# Patient Record
Sex: Male | Born: 1970
Health system: Southern US, Community
[De-identification: ages and names within clinical notes are randomized; demographics above are authoritative.]

## PROBLEM LIST (undated history)

## (undated) ENCOUNTER — Emergency Department (HOSPITAL_COMMUNITY)

## (undated) DIAGNOSIS — R768 Other specified abnormal immunological findings in serum: Secondary | ICD-10-CM

## (undated) DIAGNOSIS — K5900 Constipation, unspecified: Secondary | ICD-10-CM

## (undated) DIAGNOSIS — R911 Solitary pulmonary nodule: Secondary | ICD-10-CM

## (undated) DIAGNOSIS — N529 Male erectile dysfunction, unspecified: Secondary | ICD-10-CM

## (undated) DIAGNOSIS — K219 Gastro-esophageal reflux disease without esophagitis: Secondary | ICD-10-CM

## (undated) DIAGNOSIS — Z8719 Personal history of other diseases of the digestive system: Secondary | ICD-10-CM

## (undated) DIAGNOSIS — J449 Chronic obstructive pulmonary disease, unspecified: Secondary | ICD-10-CM

## (undated) DIAGNOSIS — G4730 Sleep apnea, unspecified: Secondary | ICD-10-CM

## (undated) DIAGNOSIS — Z862 Personal history of diseases of the blood and blood-forming organs and certain disorders involving the immune mechanism: Secondary | ICD-10-CM

## (undated) DIAGNOSIS — K7460 Unspecified cirrhosis of liver: Secondary | ICD-10-CM

## (undated) DIAGNOSIS — K5790 Diverticulosis of intestine, part unspecified, without perforation or abscess without bleeding: Secondary | ICD-10-CM

## (undated) DIAGNOSIS — Z5189 Encounter for other specified aftercare: Secondary | ICD-10-CM

## (undated) DIAGNOSIS — H269 Unspecified cataract: Secondary | ICD-10-CM

## (undated) DIAGNOSIS — K759 Inflammatory liver disease, unspecified: Secondary | ICD-10-CM

## (undated) DIAGNOSIS — K648 Other hemorrhoids: Secondary | ICD-10-CM

## (undated) DIAGNOSIS — K3189 Other diseases of stomach and duodenum: Secondary | ICD-10-CM

## (undated) DIAGNOSIS — F1910 Other psychoactive substance abuse, uncomplicated: Secondary | ICD-10-CM

## (undated) DIAGNOSIS — M5030 Other cervical disc degeneration, unspecified cervical region: Secondary | ICD-10-CM

## (undated) DIAGNOSIS — F1020 Alcohol dependence, uncomplicated: Secondary | ICD-10-CM

## (undated) DIAGNOSIS — M1990 Unspecified osteoarthritis, unspecified site: Secondary | ICD-10-CM

## (undated) DIAGNOSIS — R7689 Other specified abnormal immunological findings in serum: Secondary | ICD-10-CM

## (undated) DIAGNOSIS — I8500 Esophageal varices without bleeding: Secondary | ICD-10-CM

## (undated) DIAGNOSIS — I10 Essential (primary) hypertension: Secondary | ICD-10-CM

## (undated) DIAGNOSIS — K766 Portal hypertension: Secondary | ICD-10-CM

## (undated) HISTORY — DX: Diverticulosis of intestine, part unspecified, without perforation or abscess without bleeding: K57.90

## (undated) HISTORY — DX: Other diseases of stomach and duodenum: K76.6

## (undated) HISTORY — DX: Unspecified osteoarthritis, unspecified site: M19.90

## (undated) HISTORY — DX: Unspecified cirrhosis of liver: K74.60

## (undated) HISTORY — PX: KNEE SURGERY: SHX244

## (undated) HISTORY — PX: OTHER SURGICAL HISTORY: SHX169

## (undated) HISTORY — DX: Essential (primary) hypertension: I10

## (undated) HISTORY — DX: Alcohol dependence, uncomplicated: F10.20

## (undated) HISTORY — DX: Other diseases of stomach and duodenum: K31.89

## (undated) HISTORY — DX: Unspecified cataract: H26.9

## (undated) HISTORY — DX: Other specified abnormal immunological findings in serum: R76.89

## (undated) HISTORY — DX: Other hemorrhoids: K64.8

## (undated) HISTORY — DX: Other psychoactive substance abuse, uncomplicated: F19.10

## (undated) HISTORY — DX: Encounter for other specified aftercare: Z51.89

## (undated) HISTORY — DX: Sleep apnea, unspecified: G47.30

## (undated) HISTORY — DX: Esophageal varices without bleeding: I85.00

## (undated) HISTORY — DX: Other specified abnormal immunological findings in serum: R76.8

---

## 2000-09-30 ENCOUNTER — Emergency Department (HOSPITAL_COMMUNITY): Admission: EM | Admit: 2000-09-30 | Discharge: 2000-09-30 | Payer: Self-pay | Admitting: Emergency Medicine

## 2001-04-11 ENCOUNTER — Emergency Department (HOSPITAL_COMMUNITY): Admission: EM | Admit: 2001-04-11 | Discharge: 2001-04-11 | Payer: Self-pay | Admitting: Emergency Medicine

## 2001-04-11 ENCOUNTER — Encounter: Payer: Self-pay | Admitting: Emergency Medicine

## 2001-07-02 ENCOUNTER — Encounter: Payer: Self-pay | Admitting: Emergency Medicine

## 2001-07-02 ENCOUNTER — Emergency Department (HOSPITAL_COMMUNITY): Admission: EM | Admit: 2001-07-02 | Discharge: 2001-07-02 | Payer: Self-pay | Admitting: Emergency Medicine

## 2001-09-15 ENCOUNTER — Encounter: Payer: Self-pay | Admitting: Emergency Medicine

## 2001-09-15 ENCOUNTER — Inpatient Hospital Stay (HOSPITAL_COMMUNITY): Admission: AC | Admit: 2001-09-15 | Discharge: 2001-09-24 | Payer: Self-pay

## 2001-09-16 ENCOUNTER — Encounter: Payer: Self-pay | Admitting: Orthopedic Surgery

## 2001-09-16 ENCOUNTER — Encounter: Payer: Self-pay | Admitting: General Surgery

## 2001-09-17 ENCOUNTER — Encounter: Payer: Self-pay | Admitting: Orthopedic Surgery

## 2001-09-18 ENCOUNTER — Encounter: Payer: Self-pay | Admitting: General Surgery

## 2001-09-24 ENCOUNTER — Inpatient Hospital Stay (HOSPITAL_COMMUNITY)
Admission: RE | Admit: 2001-09-24 | Discharge: 2001-10-01 | Payer: Self-pay | Admitting: Physical Medicine & Rehabilitation

## 2002-02-11 ENCOUNTER — Ambulatory Visit (HOSPITAL_BASED_OUTPATIENT_CLINIC_OR_DEPARTMENT_OTHER): Admission: RE | Admit: 2002-02-11 | Discharge: 2002-02-11 | Payer: Self-pay | Admitting: Orthopedic Surgery

## 2002-02-26 ENCOUNTER — Encounter: Admission: RE | Admit: 2002-02-26 | Discharge: 2002-05-27 | Payer: Self-pay | Admitting: Orthopedic Surgery

## 2002-05-28 ENCOUNTER — Encounter: Admission: RE | Admit: 2002-05-28 | Discharge: 2002-08-26 | Payer: Self-pay | Admitting: Orthopedic Surgery

## 2002-08-27 ENCOUNTER — Encounter: Admission: RE | Admit: 2002-08-27 | Discharge: 2002-10-12 | Payer: Self-pay | Admitting: Orthopedic Surgery

## 2003-01-20 ENCOUNTER — Encounter: Admission: RE | Admit: 2003-01-20 | Discharge: 2003-04-20 | Payer: Self-pay | Admitting: Orthopaedic Surgery

## 2003-04-21 ENCOUNTER — Encounter: Admission: RE | Admit: 2003-04-21 | Discharge: 2003-06-25 | Payer: Self-pay | Admitting: Orthopaedic Surgery

## 2003-07-07 ENCOUNTER — Ambulatory Visit (HOSPITAL_BASED_OUTPATIENT_CLINIC_OR_DEPARTMENT_OTHER): Admission: RE | Admit: 2003-07-07 | Discharge: 2003-07-07 | Payer: Self-pay | Admitting: Orthopedic Surgery

## 2012-09-07 ENCOUNTER — Emergency Department (INDEPENDENT_AMBULATORY_CARE_PROVIDER_SITE_OTHER): Payer: Self-pay

## 2012-09-07 ENCOUNTER — Emergency Department (INDEPENDENT_AMBULATORY_CARE_PROVIDER_SITE_OTHER)
Admission: EM | Admit: 2012-09-07 | Discharge: 2012-09-07 | Disposition: A | Discharge status: Home / Self Care | Payer: Self-pay | Source: Home / Self Care | Attending: Emergency Medicine | Admitting: Emergency Medicine

## 2012-09-07 ENCOUNTER — Encounter (HOSPITAL_COMMUNITY): Payer: Self-pay | Admitting: Emergency Medicine

## 2012-09-07 DIAGNOSIS — S82409A Unspecified fracture of shaft of unspecified fibula, initial encounter for closed fracture: Secondary | ICD-10-CM

## 2012-09-07 MED ORDER — OXYCODONE-ACETAMINOPHEN 5-325 MG PO TABS: 2.0000 | ORAL_TABLET | Freq: Three times a day (TID) | ORAL | Status: DC | PRN

## 2012-09-07 MED ORDER — IBUPROFEN 800 MG PO TABS: 800.0000 mg | ORAL_TABLET | Freq: Three times a day (TID) | ORAL | Status: DC

## 2012-09-07 NOTE — ED Notes (Signed)
Pt states that he was walking down the steps last p.m about 10:30  and missed a step and falling with all weight on right leg.  Pain is worse with walking. Pain is located in upper calf area. Some swelling.   Pt has used goodies powders for pain relief.

## 2012-09-07 NOTE — ED Provider Notes (Signed)
History     CSN: [id number]  Arrival date & time 09/07/12  1413   First MD Initiated Contact with Patient 09/07/12 1416      Chief Complaint  Patient presents with  . Leg Injury    right leg injury. walking down steps in the dark and missed a step fall with all weight on right leg.    (Consider location/radiation/quality/duration/timing/severity/associated sxs/prior treatment) HPI Comments: Patient presents this afternoon to urgent care complaining of pain to the right lower aspect of his right lower leg under the level of his right knee. He describes that last night he was walking down the steps around 10:30 PM when he missed a step and fell with all his weight on his right leg. Since last night he's been expressing moderate to severe pain in the affected area. Denies any numbness, tingling sensation or weakness of his lower leg. Describes some swelling to the lateral aspect of his right lower leg. Patient did not sustain any cuts, although has some superficial abrasions to the anterior aspect of his knee.  Patient is a 41 y.o. male presenting with fall. The history is provided by the patient.  Fall The accident occurred yesterday. The fall occurred while walking. He fell from a height of 3 to 5 ft. He landed on a hard floor. Point of impact: Right leg. Pain location: Right leg under knee. The pain is at a severity of 7/10. The pain is moderate. He was ambulatory at the scene. There was no entrapment after the fall. Pertinent negatives include no visual change, no fever, no numbness, no bowel incontinence and no tingling. The symptoms are aggravated by activity, standing, flexion, ambulation and extension. He has tried NSAIDs for the symptoms. The treatment provided no relief.    History reviewed. No pertinent past medical history.  Past Surgical History  Procedure Date  . Arm surgery   . Knee surgery     History reviewed. No pertinent family history.  History  Substance Use  Topics  . Smoking status: Current Every Day Smoker -- 0.5 packs/day    Types: Cigarettes  . Smokeless tobacco: Not on file  . Alcohol Use: Yes     occasional      Review of Systems  Constitutional: Positive for activity change. Negative for fever.  Gastrointestinal: Negative for bowel incontinence.  Musculoskeletal:       Right lower leg pain  Skin: Positive for wound.  Neurological: Negative for tingling, weakness and numbness.    Allergies  Review of patient's allergies indicates no known allergies.  Home Medications   Current Outpatient Rx  Name Route Sig Dispense Refill  . IBUPROFEN 800 MG PO TABS Oral Take 1 tablet (800 mg total) by mouth 3 (three) times daily. 20 tablet 0  . OXYCODONE-ACETAMINOPHEN 5-325 MG PO TABS Oral Take 2 tablets by mouth every 8 (eight) hours as needed for pain. 15 tablet 0    BP 130/82  Pulse 83  Temp 98.1 F (36.7 C) (Oral)  Resp 21  SpO2 98%  Physical Exam  Nursing note and vitals reviewed. Constitutional: Vital signs are normal. He appears well-developed and well-nourished.  Non-toxic appearance. He does not have a sickly appearance. He does not appear ill. No distress.  Musculoskeletal: He exhibits tenderness.       Right knee: He exhibits normal range of motion and no swelling.       Legs: Neurological: He is alert.  Skin: No erythema.    ED Course  Procedures (including critical care time)  Labs Reviewed - No data to display Dg Tibia/fibula Right  09/07/2012  *RADIOLOGY REPORT*  Clinical Data: Proximal lateral lower leg pain status post fall.  RIGHT TIBIA AND FIBULA - 2 VIEW  Comparison: None.  Findings: There is an oblique mildly displaced fracture of the proximal fibular diaphysis.  This demonstrates no extension to the proximal tibiofibular articulation.  No tibial fracture is identified.  There is no evidence of dislocation. The distal fibular tip is not visualized on the AP view.  IMPRESSION: Mildly displaced fracture of  the proximal fibula.   Original Report Authenticated By: Gerrianne Scale, M.D.      1. Fibula fracture       MDM  Minimally displaced right proximal fibula fracture. Status post fall, patient with no neurovascular deficits distally. Management and case was discussed with the orthopedic Dr. on call Dr. Magnus Ivan, decision was made to put patient in a knee immobilizer, pain management with both Motrin and Percocet as needed. Patient was instructed to followup with Dr. Magnus Ivan next week. Patient agrees and understands treatment plan and followup care with orthopedic services.        Jimmie Molly, MD 09/07/12 [phone]

## 2015-03-03 ENCOUNTER — Emergency Department (HOSPITAL_COMMUNITY)
Admission: EM | Admit: 2015-03-03 | Discharge: 2015-03-03 | Disposition: A | Discharge status: Home / Self Care | Payer: Self-pay | Attending: Emergency Medicine | Admitting: Emergency Medicine

## 2015-03-03 ENCOUNTER — Emergency Department (HOSPITAL_COMMUNITY): Payer: Self-pay

## 2015-03-03 ENCOUNTER — Encounter (HOSPITAL_COMMUNITY): Payer: Self-pay | Admitting: Emergency Medicine

## 2015-03-03 DIAGNOSIS — Z720 Tobacco use: Secondary | ICD-10-CM | POA: Insufficient documentation

## 2015-03-03 DIAGNOSIS — Y998 Other external cause status: Secondary | ICD-10-CM | POA: Insufficient documentation

## 2015-03-03 DIAGNOSIS — W3400XA Accidental discharge from unspecified firearms or gun, initial encounter: Secondary | ICD-10-CM | POA: Insufficient documentation

## 2015-03-03 DIAGNOSIS — S71031A Puncture wound without foreign body, right hip, initial encounter: Secondary | ICD-10-CM

## 2015-03-03 DIAGNOSIS — S71001A Unspecified open wound, right hip, initial encounter: Secondary | ICD-10-CM | POA: Insufficient documentation

## 2015-03-03 DIAGNOSIS — Y9389 Activity, other specified: Secondary | ICD-10-CM | POA: Insufficient documentation

## 2015-03-03 DIAGNOSIS — Y92410 Unspecified street and highway as the place of occurrence of the external cause: Secondary | ICD-10-CM | POA: Insufficient documentation

## 2015-03-03 LAB — PREPARE FRESH FROZEN PLASMA
UNIT DIVISION: 0
Unit division: 0

## 2015-03-03 LAB — I-STAT CG4 LACTIC ACID, ED: Lactic Acid, Venous: 1.58 mmol/L (ref 0.5–2.0)

## 2015-03-03 LAB — I-STAT CHEM 8, ED
BUN: 10 mg/dL (ref 6–23)
Calcium, Ion: 1.09 mmol/L — ABNORMAL LOW (ref 1.12–1.23)
Chloride: 98 mmol/L (ref 96–112)
Creatinine, Ser: 1.1 mg/dL (ref 0.50–1.35)
Glucose, Bld: 118 mg/dL — ABNORMAL HIGH (ref 70–99)
HEMATOCRIT: 51 % (ref 39.0–52.0)
Hemoglobin: 17.3 g/dL — ABNORMAL HIGH (ref 13.0–17.0)
Potassium: 3.8 mmol/L (ref 3.5–5.1)
SODIUM: 135 mmol/L (ref 135–145)
TCO2: 22 mmol/L (ref 0–100)

## 2015-03-03 LAB — ABO/RH: ABO/RH(D): A POS

## 2015-03-03 MED ORDER — FENTANYL CITRATE (PF) 100 MCG/2ML IJ SOLN
100.0000 ug | Freq: Once | INTRAMUSCULAR | Status: AC
  Administered 2015-03-03: 100 ug via INTRAVENOUS
  Filled 2015-03-03: qty 2

## 2015-03-03 MED ORDER — FENTANYL CITRATE (PF) 100 MCG/2ML IJ SOLN
100.0000 ug | Freq: Once | INTRAMUSCULAR | Status: AC
  Administered 2015-03-03: 100 ug via INTRAVENOUS

## 2015-03-03 MED ORDER — HYDROCODONE-ACETAMINOPHEN 5-325 MG PO TABS: 2.0000 | ORAL_TABLET | ORAL | Status: DC | PRN

## 2015-03-03 MED ORDER — IBUPROFEN 800 MG PO TABS: 800.0000 mg | ORAL_TABLET | Freq: Three times a day (TID) | ORAL | Status: DC

## 2015-03-03 MED ORDER — FENTANYL CITRATE (PF) 100 MCG/2ML IJ SOLN
INTRAMUSCULAR | Status: AC
  Filled 2015-03-03: qty 2
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
## 2015-03-03 NOTE — ED Notes (Signed)
Patient transported to X-ray 

## 2015-03-04 LAB — TYPE AND SCREEN
ABO/RH(D): A POS
Antibody Screen: NEGATIVE
Unit division: 0
Unit division: 0
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
## 2015-03-15 ENCOUNTER — Emergency Department (HOSPITAL_COMMUNITY)
Admission: EM | Admit: 2015-03-15 | Discharge: 2015-03-15 | Disposition: A | Discharge status: Home / Self Care | Payer: Self-pay | Attending: Emergency Medicine | Admitting: Emergency Medicine

## 2015-03-15 ENCOUNTER — Encounter (HOSPITAL_COMMUNITY): Payer: Self-pay | Admitting: *Deleted

## 2015-03-15 ENCOUNTER — Emergency Department (HOSPITAL_COMMUNITY): Payer: Self-pay

## 2015-03-15 DIAGNOSIS — Y9289 Other specified places as the place of occurrence of the external cause: Secondary | ICD-10-CM | POA: Insufficient documentation

## 2015-03-15 DIAGNOSIS — Z720 Tobacco use: Secondary | ICD-10-CM | POA: Insufficient documentation

## 2015-03-15 DIAGNOSIS — W1839XA Other fall on same level, initial encounter: Secondary | ICD-10-CM | POA: Insufficient documentation

## 2015-03-15 DIAGNOSIS — R55 Syncope and collapse: Secondary | ICD-10-CM | POA: Insufficient documentation

## 2015-03-15 DIAGNOSIS — Z043 Encounter for examination and observation following other accident: Secondary | ICD-10-CM | POA: Insufficient documentation

## 2015-03-15 DIAGNOSIS — R079 Chest pain, unspecified: Secondary | ICD-10-CM | POA: Insufficient documentation

## 2015-03-15 DIAGNOSIS — Y9389 Activity, other specified: Secondary | ICD-10-CM | POA: Insufficient documentation

## 2015-03-15 DIAGNOSIS — Y998 Other external cause status: Secondary | ICD-10-CM | POA: Insufficient documentation

## 2015-03-15 DIAGNOSIS — Z79899 Other long term (current) drug therapy: Secondary | ICD-10-CM | POA: Insufficient documentation

## 2015-03-15 LAB — BASIC METABOLIC PANEL
ANION GAP: 13 (ref 5–15)
CALCIUM: 9.3 mg/dL (ref 8.9–10.3)
CO2: 24 mmol/L (ref 22–32)
CREATININE: 0.88 mg/dL (ref 0.61–1.24)
Chloride: 100 mmol/L — ABNORMAL LOW (ref 101–111)
GFR calc Af Amer: >60 mL/min (ref >60)
GFR calc non Af Amer: >60 mL/min (ref >60)
Glucose, Bld: 112 mg/dL — ABNORMAL HIGH (ref 70–99)
Potassium: 4 mmol/L (ref 3.5–5.1)
Sodium: 137 mmol/L (ref 135–145)

## 2015-03-15 LAB — CBC
HCT: 45.3 % (ref 39.0–52.0)
Hemoglobin: 15.8 g/dL (ref 13.0–17.0)
MCH: 33.9 pg (ref 26.0–34.0)
MCHC: 34.9 g/dL (ref 30.0–36.0)
MCV: 97.2 fL (ref 78.0–100.0)
Platelets: 196 10*3/uL (ref 150–400)
RBC: 4.66 MIL/uL (ref 4.22–5.81)
RDW: 14 % (ref 11.5–15.5)
WBC: 5 10*3/uL (ref 4.0–10.5)

## 2015-03-15 LAB — I-STAT TROPONIN, ED: TROPONIN I, POC: 0 ng/mL (ref 0.00–0.08)

## 2015-03-15 MED ORDER — IOHEXOL 350 MG/ML SOLN
80.0000 mL | Freq: Once | INTRAVENOUS | Status: AC | PRN
  Administered 2015-03-15: 80 mL via INTRAVENOUS

## 2015-03-15 MED ORDER — LORAZEPAM 1 MG PO TABS
1.0000 mg | ORAL_TABLET | Freq: Once | ORAL | Status: AC
  Administered 2015-03-15: 1 mg via ORAL
  Filled 2015-03-15: qty 1

## 2015-03-15 MED ORDER — SODIUM CHLORIDE 0.9 % IV BOLUS (SEPSIS)
1000.0000 mL | Freq: Once | INTRAVENOUS | Status: AC
  Administered 2015-03-15: 1000 mL via INTRAVENOUS

## 2015-03-15 NOTE — Discharge Instructions (Signed)
Syncope °Syncope is a medical term for fainting or passing out. This means you lose consciousness and drop to the ground. People are generally unconscious for less than 5 minutes. You may have some muscle twitches for up to 15 seconds before waking up and returning to normal. Syncope occurs more often in older adults, but it can happen to anyone. While most causes of syncope are not dangerous, syncope can be a sign of a serious medical problem. It is important to seek medical care.  °CAUSES  °Syncope is caused by a sudden drop in blood flow to the brain. The specific cause is often not determined. Factors that can bring on syncope include: °· Taking medicines that lower blood pressure. °· Sudden changes in posture, such as standing up quickly. °· Taking more medicine than prescribed. °· Standing in one place for too long. °· Seizure disorders. °· Dehydration and excessive exposure to heat. °· Low blood sugar (hypoglycemia). °· Straining to have a bowel movement. °· Heart disease, irregular heartbeat, or other circulatory problems. °· Fear, emotional distress, seeing blood, or severe pain. °SYMPTOMS  °Right before fainting, you may: °· Feel dizzy or light-headed. °· Feel nauseous. °· See all white or all black in your field of vision. °· Have cold, clammy skin. °DIAGNOSIS  °Your health care provider will ask about your symptoms, perform a physical exam, and perform an electrocardiogram (ECG) to record the electrical activity of your heart. Your health care provider may also perform other heart or blood tests to determine the cause of your syncope which may include: °· Transthoracic echocardiogram (TTE). During echocardiography, sound waves are used to evaluate how blood flows through your heart. °· Transesophageal echocardiogram (TEE). °· Cardiac monitoring. This allows your health care provider to monitor your heart rate and rhythm in real time. °· Holter monitor. This is a portable device that records your  heartbeat and can help diagnose heart arrhythmias. It allows your health care provider to track your heart activity for several days, if needed. °· Stress tests by exercise or by giving medicine that makes the heart beat faster. °TREATMENT  °In most cases, no treatment is needed. Depending on the cause of your syncope, your health care provider may recommend changing or stopping some of your medicines. °HOME CARE INSTRUCTIONS °· Have someone stay with you until you feel stable. °· Do not drive, use machinery, or play sports until your health care provider says it is okay. °· Keep all follow-up appointments as directed by your health care provider. °· Lie down right away if you start feeling like you might faint. Breathe deeply and steadily. Wait until all the symptoms have passed. °· Drink enough fluids to keep your urine clear or pale yellow. °· If you are taking blood pressure or heart medicine, get up slowly and take several minutes to sit and then stand. This can reduce dizziness. °SEEK IMMEDIATE MEDICAL CARE IF:  °· You have a severe headache. °· You have unusual pain in the chest, abdomen, or back. °· You are bleeding from your mouth or rectum, or you have black or tarry stool. °· You have an irregular or very fast heartbeat. °· You have pain with breathing. °· You have repeated fainting or seizure-like jerking during an episode. °· You faint when sitting or lying down. °· You have confusion. °· You have trouble walking. °· You have severe weakness. °· You have vision problems. °If you fainted, call your local emergency services (911 in U.[place] Do not drive   yourself to the hospital.  MAKE SURE YOU:  Understand these instructions.  Will watch your condition.  Will get help right away if you are not doing well or get worse. Document Released: 10/29/2005 Document Revised: 11/03/2013 Document Reviewed: 12/28/2011 [facility] Patient Information 2015 [place], [place]. This information is not intended to replace  advice given to you by your health care provider. Make sure you discuss any questions you have with your health care provider.  Emergency Department Resource Guide 1) Find a Doctor and Pay Out of Pocket Although you won't have to find out who is covered by your insurance plan, it is a good idea to ask around and get recommendations. You will then need to call the office and see if the doctor you have chosen will accept you as a new patient and what types of options they offer for patients who are self-pay. Some doctors offer discounts or will set up payment plans for their patients who do not have insurance, but you will need to ask so you aren't surprised when you get to your appointment.  2) Contact Your Local Health Department Not all health departments have doctors that can see patients for sick visits, but many do, so it is worth a call to see if yours does. If you don't know where your local health department is, you can check in your phone book. The CDC also has a tool to help you locate your state's health department, and many state websites also have listings of all of their local health departments.  3) Find a [facility] If your illness is not likely to be very severe or complicated, you may want to try a walk in clinic. These are popping up all over the country in pharmacies, drugstores, and shopping centers. They're usually staffed by nurse practitioners or physician assistants that have been trained to treat common illnesses and complaints. They're usually fairly quick and inexpensive. However, if you have serious medical issues or chronic medical problems, these are probably not your best option.  No Primary Care Doctor: - Call Health Connect at  [phone] - they can help you locate a primary care doctor that  accepts your insurance, provides certain services, etc. - Physician Referral Service- [phone]  Chronic Pain Problems: Organization         Address  Phone    Notes  [facility]  [phone] Patients need to be referred by their primary care doctor.   Medication Assistance: Organization         Address  Phone   Notes  [facility] Medication [facility] [place] [address] [place], [place] [address] [phone] --Must be a resident of [facility] -- Must have NO insurance coverage whatsoever (no Medicaid/ Medicare, etc.) -- The pt. MUST have a primary care doctor that directs their care regularly and follows them in the community   Med[facility]ist  [phone]   [employer]  [phone]    Agencies that provide inexpensive medical care: Organization         Address  Phone   Notes  [place]  [phone]   [facility]    [phone]   [facility] [place], [place] [address] [phone]   [place] [address] [place] [phone]   Planned Parenthood    [phone]   [facility]    [phone]  Community Health and [place]  [address], [place] Phone:  [phone], Fax:  [phone] Hours of Operation:  9 am - 6 pm, M-F.  Also accepts Medicaid/Medicare and self-pay.  [facility] for [place], [address], [place] Phone: [phone], Fax: [phone]. Hours of Operation:  8:30 am - 5:30 pm, M-F.  Also accepts Medicaid and self-pay.  [facility] [place] [address], [place] Phone: [phone]   [place], [place], [place] [phone], Ext. [phone] Mondays & Thursdays: 7-9 AM.  First 15 patients are seen on a first come, first serve basis.    [place] Providers:  Organization         Address  Phone   Notes  [facility] [address], [place] [phone] Also accepts self-pay patients.  [facility]  [address], [place]  [phone]   [place], [address], [place] [phone]   [facility] [address] [place] [phone]   Lucianne Lei [address], [place]   [phone] Only accepts [place] Access [place] patients after they have their name applied to their card.   Self-Pay (no insurance) in [facility]:  Organization         Address  Phone   Notes  Sickle Cell Patients, [facility] Internal Medicine [place] [phone]   [facility] [place] [phone]   [facility] [place]  [place], [place], [place] [phone]   [facility]/Dr. Osei-Bonsu  [address], [place] or [address], [place] [phone] Phone number for both [place] and [place] locations is the same.  Urgent Medical and [facility] [address], [place] [phone]   [facility] [address] [place] or [address] [phone]   [facility] [address] [place] [phone], phone; [phone], fax Sees patients 1st and 3rd Saturday of every month.  Must not qualify for public or private insurance (i.e. Medicaid, Medicare, [place] Health Choice, Veterans' Benefits)  Household income should be no more than 200% of the poverty level The clinic cannot treat you if you are pregnant or think you are pregnant  Sexually transmitted diseases are not treated at the clinic.    Dental Care: Organization         Address  Phone  Notes  [facility] Department of [facility] [place] [phone] Accepts children up to age 71 who are enrolled in [place] or [place]; pregnant women with a Medicaid card; and children who have  applied for Medicaid or [place] Health Choice, but were declined, whose parents can pay a reduced fee at time of service.  [facility] Department of [facility]  [address], [place] [phone] Accepts children up to age 77 who are enrolled in [place] or [place]; pregnant women with a Medicaid card; and children who have applied for Medicaid or [place] Health Choice, but were declined, whose parents can pay a reduced fee at time of service.  [place]  [phone]  [place] [phone] Patients are seen by appointment only. Walk-ins are not accepted. [place] will see patients 56 years of age and older. Monday - Tuesday (8am-5pm) Most Wednesdays (8:30-5pm) $30 per visit, cash only  [facility] Adult Dental Access PROGRAM  [address], [facility] [phone] Patients are seen by appointment only. Walk-ins are not accepted. [place] will see patients 83 years of age and older. One Wednesday Evening (Monthly: Volunteer Based).  $30 per visit, cash only  [place]  [phone] for adults; Children under age 86, call Graduate Pediatric Dentistry at [phone]. Children aged 45-14, please call [phone] to request a pediatric application.  Dental services are provided in all areas of dental care including fillings, crowns and bridges, complete and partial dentures, implants, gum treatment, root canals, and extractions. Preventive care is also provided. Treatment is provided to both adults and children. Patients are selected via a lottery and there is often a waiting list.   [facility] [address] [place]  [phone] [email or web address]   [facility] [address] [place], [place] [phone], Ext. [phone] Second and Fourth Thursday of each month, opens at 6:30 AM; Clinic ends at 9 AM.  Patients are seen on a first-come first-served basis, and a  limited number are seen during each clinic.   [facility]  [address] [place], [place] [phone]   Eligibility Requirements You must have lived in [place], [place], or [place] counties for at least the last three months.   You cannot be eligible for state or federal sponsored [employer], including [employer], [place], or [employer].   You generally cannot be eligible for healthcare insurance through your employer.    How to apply: Eligibility screenings are held every Tuesday and Wednesday afternoon from 1:00 pm until 4:00 pm. You do not need an appointment for the interview!  [facility] [address], [place], [place]   [place]  [place] Department  [place]  [phone]    Behavioral Health Resources in the Community: Intensive Outpatient Programs Organization         Address  Phone  Notes  [place]. [address], [place], [place] [phone]   [facility] [address], [place], [place]   ADS: Alcohol & Drug Svcs [address], [place], [place]   [place] [address],  [place], [place] or [phone]   Substance Abuse Resources Organization         Address  Phone  Notes  Alcohol and Drug Services  [phone]   [place]  [phone]   [facility] [place]   Chinita Pester  [phone]   Residential & Outpatient Substance Abuse Program  [phone]   Psychological Services Organization         Address  Phone  Notes  [facility] [place]  [place]  [phone]   [place] [address] [place] or [phone]    Mobile Crisis Teams Organization          Address  Phone  Notes  [facility], Mobile Crisis Care Unit  [phone]   [facility]  [address] [place], [place]  [facility] [place] [address], [address] [place] [phone]    Self-Help/Support Groups Organization         Address  Phone             Notes  [facility] [place] - variety of support groups  [place] Call for more information  Narcotics Anonymous (NA), [facility] [address], [employer] [place]  2 meetings at this location   Special educational needs teacher         Address  Phone  Notes  ASAP Residential Treatment [place],    [place]  [phone]   [facility]  [address], [place] [address]1884, [place], [place]   [place], [place] [phone] Admissions: 8am-3pm M-F  Incentives Substance [place] [address]    [place], [place] [phone]   [facility] [address] [place], [place], [place]   [facility] [address]  [place], [place]   [facility] - Intensive Outpatient [address], [place], [place]   [facility] ([place] [place].,  [place], [place] [phone] or [phone]   [facility] Services (RTS) [address] [place], [place] Accepts Medicaid  [facility] [place] [address]  [place] [phone] Substance Abuse/Addiction Treatment   [facility] Organization         Address  Phone  Notes  [facility]  [phone]   Domenic Schwab, PhD [address] [place], [place]   [phone] or [phone]   [place], [place] [phone]   [facility] [address], [place], [place] [phone] Insurance/Medicaid/sponsorship  through [facility] and Families [address], [address] [place]                                    [place], [place] [phone] [place] [address], [place] [phone]    Dr. Adele Schilder  [phone]   [facility] [place] Dept. 1) [address], [place] 2) [place] 3)  [place], [place] [phone]  [phone]   [place] [phone] or [phone] (After Hours)       You have a nodule on your lung that will need repeat CT scan of your chest in the next 6-12 months.  Follow up with your family doctor for repeat imaging.
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
## 2015-03-15 NOTE — ED Notes (Addendum)
Pt states he has been having episodes of coughing where he would pass out, now reports that he has been having episodes of syncope when he was not cough. Reports that he is dizzy when he is up moving around. Pt alert, oriented x 4, nad. No neuro deficits noted.

## 2015-03-15 NOTE — ED Notes (Signed)
MD at bedside. 

## 2015-03-15 NOTE — ED Notes (Addendum)
Pt states that he has been coughing and "loosing his breath" pt would then "pass out". pt states that this has been ongoing for several days. Pt states that she he would do that his whole body would shake. Pt tearful at triage. States that he does not feel well. Pt alert and oriented x4 at this time. No neuro deficits at this time.

## 2015-04-24 ENCOUNTER — Encounter (HOSPITAL_COMMUNITY): Payer: Self-pay | Admitting: Emergency Medicine

## 2015-04-24 ENCOUNTER — Emergency Department (HOSPITAL_COMMUNITY)
Admission: EM | Admit: 2015-04-24 | Discharge: 2015-04-24 | Disposition: A | Discharge status: Home / Self Care | Payer: No Typology Code available for payment source | Attending: Emergency Medicine | Admitting: Emergency Medicine

## 2015-04-24 ENCOUNTER — Emergency Department (HOSPITAL_COMMUNITY): Payer: No Typology Code available for payment source

## 2015-04-24 DIAGNOSIS — S199XXA Unspecified injury of neck, initial encounter: Secondary | ICD-10-CM | POA: Diagnosis not present

## 2015-04-24 DIAGNOSIS — Y998 Other external cause status: Secondary | ICD-10-CM | POA: Diagnosis not present

## 2015-04-24 DIAGNOSIS — Z720 Tobacco use: Secondary | ICD-10-CM | POA: Insufficient documentation

## 2015-04-24 DIAGNOSIS — Z8659 Personal history of other mental and behavioral disorders: Secondary | ICD-10-CM | POA: Insufficient documentation

## 2015-04-24 DIAGNOSIS — M546 Pain in thoracic spine: Secondary | ICD-10-CM

## 2015-04-24 DIAGNOSIS — M542 Cervicalgia: Secondary | ICD-10-CM

## 2015-04-24 DIAGNOSIS — I10 Essential (primary) hypertension: Secondary | ICD-10-CM | POA: Diagnosis not present

## 2015-04-24 DIAGNOSIS — Z7951 Long term (current) use of inhaled steroids: Secondary | ICD-10-CM | POA: Diagnosis not present

## 2015-04-24 DIAGNOSIS — S299XXA Unspecified injury of thorax, initial encounter: Secondary | ICD-10-CM | POA: Diagnosis not present

## 2015-04-24 DIAGNOSIS — Y9389 Activity, other specified: Secondary | ICD-10-CM | POA: Diagnosis not present

## 2015-04-24 DIAGNOSIS — Y92410 Unspecified street and highway as the place of occurrence of the external cause: Secondary | ICD-10-CM | POA: Diagnosis not present

## 2015-04-24 MED ORDER — HYDROCODONE-ACETAMINOPHEN 5-325 MG PO TABS
1.0000 | ORAL_TABLET | Freq: Once | ORAL | Status: AC
  Administered 2015-04-24: 1 via ORAL
  Filled 2015-04-24: qty 1

## 2015-04-24 MED ORDER — CYCLOBENZAPRINE HCL 5 MG PO TABS: 5.0000 mg | ORAL_TABLET | Freq: Three times a day (TID) | ORAL | Status: DC | PRN

## 2015-04-24 MED ORDER — CYCLOBENZAPRINE HCL 10 MG PO TABS
5.0000 mg | ORAL_TABLET | Freq: Once | ORAL | Status: AC
  Administered 2015-04-24: 5 mg via ORAL
  Filled 2015-04-24: qty 1

## 2015-04-24 MED ORDER — HYDROCODONE-ACETAMINOPHEN 5-325 MG PO TABS: 2.0000 | ORAL_TABLET | Freq: Four times a day (QID) | ORAL | Status: DC | PRN

## 2015-04-24 MED ORDER — HYDROCODONE-ACETAMINOPHEN 5-325 MG PO TABS: 1.0000 | ORAL_TABLET | Freq: Once | ORAL | Status: DC

## 2015-04-24 NOTE — Discharge Instructions (Signed)
Cervical Sprain °A cervical sprain is an injury in the neck in which the strong, fibrous tissues (ligaments) that connect your neck bones stretch or tear. Cervical sprains can range from mild to severe. Severe cervical sprains can cause the neck vertebrae to be unstable. This can lead to damage of the spinal cord and can result in serious nervous system problems. The amount of time it takes for a cervical sprain to get better depends on the cause and extent of the injury. Most cervical sprains heal in 1 to 3 weeks. °CAUSES  °Severe cervical sprains may be caused by:  °· Contact sport injuries (such as from football, rugby, wrestling, hockey, auto racing, gymnastics, diving, martial arts, or boxing).   °· Motor vehicle collisions.   °· Whiplash injuries. This is an injury from a sudden forward and backward whipping movement of the head and neck.  °· Falls.   °Mild cervical sprains may be caused by:  °· Being in an awkward position, such as while cradling a telephone between your ear and shoulder.   °· Sitting in a chair that does not offer proper support.   °· Working at a poorly designed computer station.   °· Looking up or down for long periods of time.   °SYMPTOMS  °· Pain, soreness, stiffness, or a burning sensation in the front, back, or sides of the neck. This discomfort may develop immediately after the injury or slowly, 24 hours or more after the injury.   °· Pain or tenderness directly in the middle of the back of the neck.   °· Shoulder or upper back pain.   °· Limited ability to move the neck.   °· Headache.   °· Dizziness.   °· Weakness, numbness, or tingling in the hands or arms.   °· Muscle spasms.   °· Difficulty swallowing or chewing.   °· Tenderness and swelling of the neck.   °DIAGNOSIS  °Most of the time your health care provider can diagnose a cervical sprain by taking your history and doing a physical exam. Your health care provider will ask about previous neck injuries and any known neck  problems, such as arthritis in the neck. X-rays may be taken to find out if there are any other problems, such as with the bones of the neck. Other tests, such as a CT scan or MRI, may also be needed.  °TREATMENT  °Treatment depends on the severity of the cervical sprain. Mild sprains can be treated with rest, keeping the neck in place (immobilization), and pain medicines. Severe cervical sprains are immediately immobilized. Further treatment is done to help with pain, muscle spasms, and other symptoms and may include: °· Medicines, such as pain relievers, numbing medicines, or muscle relaxants.   °· Physical therapy. This may involve stretching exercises, strengthening exercises, and posture training. Exercises and improved posture can help stabilize the neck, strengthen muscles, and help stop symptoms from returning.   °HOME CARE INSTRUCTIONS  °· Put ice on the injured area.   °¨ Put ice in a plastic bag.   °¨ Place a towel between your skin and the bag.   °¨ Leave the ice on for 15-20 minutes, 3-4 times a day.   °· If your injury was severe, you may have been given a cervical collar to wear. A cervical collar is a two-piece collar designed to keep your neck from moving while it heals. °¨ Do not remove the collar unless instructed by your health care provider. °¨ If you have long hair, keep it outside of the collar. °¨ Ask your health care provider before making any adjustments to your collar. Minor   adjustments may be required over time to improve comfort and reduce pressure on your chin or on the back of your head. °¨ If you are allowed to remove the collar for cleaning or bathing, follow your health care provider's instructions on how to do so safely. °¨ Keep your collar clean by wiping it with mild soap and water and drying it completely. If the collar you have been given includes removable pads, remove them every 1-2 days and hand wash them with soap and water. Allow them to air dry. They should be completely  dry before you wear them in the collar. °¨ If you are allowed to remove the collar for cleaning and bathing, wash and dry the skin of your neck. Check your skin for irritation or sores. If you see any, tell your health care provider. °¨ Do not drive while wearing the collar.   °· Only take over-the-counter or prescription medicines for pain, discomfort, or fever as directed by your health care provider.   °· Keep all follow-up appointments as directed by your health care provider.   °· Keep all physical therapy appointments as directed by your health care provider.   °· Make any needed adjustments to your workstation to promote good posture.   °· Avoid positions and activities that make your symptoms worse.   °· Warm up and stretch before being active to help prevent problems.   °SEEK MEDICAL CARE IF:  °· Your pain is not controlled with medicine.   °· You are unable to decrease your pain medicine over time as planned.   °· Your activity level is not improving as expected.   °SEEK IMMEDIATE MEDICAL CARE IF:  °· You develop any bleeding. °· You develop stomach upset. °· You have signs of an allergic reaction to your medicine.   °· Your symptoms get worse.   °· You develop new, unexplained symptoms.   °· You have numbness, tingling, weakness, or paralysis in any part of your body.   °MAKE SURE YOU:  °· Understand these instructions. °· Will watch your condition. °· Will get help right away if you are not doing well or get worse. °Document Released: 08/26/2007 Document Revised: 11/03/2013 Document Reviewed: 05/06/2013 °[employer]® Patient Information ©2015 [employer]. This information is not intended to replace advice given to you by your health care provider. Make sure you discuss any questions you have with your health care provider. ° °Motor Vehicle Collision °It is common to have multiple bruises and sore muscles after a motor vehicle collision (MVC). These tend to feel worse for the first 24 hours. You may have  the most stiffness and soreness over the first several hours. You may also feel worse when you wake up the first morning after your collision. After this point, you will usually begin to improve with each day. The speed of improvement often depends on the severity of the collision, the number of injuries, and the location and nature of these injuries. °HOME CARE INSTRUCTIONS °· Put ice on the injured area. °¨ Put ice in a plastic bag. °¨ Place a towel between your skin and the bag. °¨ Leave the ice on for 15-20 minutes, 3-4 times a day, or as directed by your health care provider. °· Drink enough fluids to keep your urine clear or pale yellow. Do not drink alcohol. °· Take a warm shower or bath once or twice a day. This will increase blood flow to sore muscles. °· You may return to activities as directed by your caregiver. Be careful when lifting, as this may aggravate neck or back   pain. °· Only take over-the-counter or prescription medicines for pain, discomfort, or fever as directed by your caregiver. Do not use aspirin. This may increase bruising and bleeding. °SEEK IMMEDIATE MEDICAL CARE IF: °· You have numbness, tingling, or weakness in the arms or legs. °· You develop severe headaches not relieved with medicine. °· You have severe neck pain, especially tenderness in the middle of the back of your neck. °· You have changes in bowel or bladder control. °· There is increasing pain in any area of the body. °· You have shortness of breath, light-headedness, dizziness, or fainting. °· You have chest pain. °· You feel sick to your stomach (nauseous), throw up (vomit), or sweat. °· You have increasing abdominal discomfort. °· There is blood in your urine, stool, or vomit. °· You have pain in your shoulder (shoulder strap areas). °· You feel your symptoms are getting worse. °MAKE SURE YOU: °· Understand these instructions. °· Will watch your condition. °· Will get help right away if you are not doing well or get  worse. °Document Released: 10/29/2005 Document Revised: 03/15/2014 Document Reviewed: 03/28/2011 °[employer]® Patient Information ©2015 [employer]. This information is not intended to replace advice given to you by your health care provider. Make sure you discuss any questions you have with your health care provider. ° °

## 2015-04-24 NOTE — ED Notes (Signed)
Declined W/C at D/C and was escorted to lobby by RN. 
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
## 2015-05-20 ENCOUNTER — Ambulatory Visit: Payer: Self-pay | Admitting: Neurology

## 2016-07-05 ENCOUNTER — Other Ambulatory Visit: Payer: Self-pay

## 2016-07-05 ENCOUNTER — Emergency Department (HOSPITAL_COMMUNITY)
Admission: EM | Admit: 2016-07-05 | Discharge: 2016-07-05 | Disposition: A | Discharge status: Home / Self Care | Payer: Self-pay | Attending: Emergency Medicine | Admitting: Emergency Medicine

## 2016-07-05 ENCOUNTER — Encounter (HOSPITAL_COMMUNITY): Payer: Self-pay | Admitting: Emergency Medicine

## 2016-07-05 ENCOUNTER — Emergency Department (HOSPITAL_COMMUNITY): Payer: Self-pay

## 2016-07-05 DIAGNOSIS — Z720 Tobacco use: Secondary | ICD-10-CM

## 2016-07-05 DIAGNOSIS — R5382 Chronic fatigue, unspecified: Secondary | ICD-10-CM | POA: Insufficient documentation

## 2016-07-05 DIAGNOSIS — I10 Essential (primary) hypertension: Secondary | ICD-10-CM | POA: Insufficient documentation

## 2016-07-05 DIAGNOSIS — J441 Chronic obstructive pulmonary disease with (acute) exacerbation: Secondary | ICD-10-CM | POA: Insufficient documentation

## 2016-07-05 DIAGNOSIS — F17210 Nicotine dependence, cigarettes, uncomplicated: Secondary | ICD-10-CM | POA: Insufficient documentation

## 2016-07-05 DIAGNOSIS — R52 Pain, unspecified: Secondary | ICD-10-CM

## 2016-07-05 LAB — COMPREHENSIVE METABOLIC PANEL
ALBUMIN: 4.4 g/dL (ref 3.5–5.0)
ALT: 85 U/L — ABNORMAL HIGH (ref 17–63)
AST: 65 U/L — ABNORMAL HIGH (ref 15–41)
Alkaline Phosphatase: 55 U/L (ref 38–126)
Anion gap: 11 (ref 5–15)
BUN: 7 mg/dL (ref 6–20)
CO2: 22 mmol/L (ref 22–32)
Calcium: 9.1 mg/dL (ref 8.9–10.3)
Chloride: 98 mmol/L — ABNORMAL LOW (ref 101–111)
Creatinine, Ser: 0.83 mg/dL (ref 0.61–1.24)
GFR calc Af Amer: >60 mL/min (ref >60)
GFR calc non Af Amer: >60 mL/min (ref >60)
GLUCOSE: 93 mg/dL (ref 65–99)
POTASSIUM: 4 mmol/L (ref 3.5–5.1)
SODIUM: 131 mmol/L — AB (ref 135–145)
Total Bilirubin: 0.7 mg/dL (ref 0.3–1.2)
Total Protein: 8.1 g/dL (ref 6.5–8.1)

## 2016-07-05 LAB — CBC
HCT: 46.9 % (ref 39.0–52.0)
HEMOGLOBIN: 16 g/dL (ref 13.0–17.0)
MCH: 34.3 pg — ABNORMAL HIGH (ref 26.0–34.0)
MCHC: 34.1 g/dL (ref 30.0–36.0)
MCV: 100.6 fL — ABNORMAL HIGH (ref 78.0–100.0)
Platelets: 170 10*3/uL (ref 150–400)
RBC: 4.66 MIL/uL (ref 4.22–5.81)
RDW: 15 % (ref 11.5–15.5)
WBC: 9.9 10*3/uL (ref 4.0–10.5)

## 2016-07-05 MED ORDER — ALBUTEROL SULFATE HFA 108 (90 BASE) MCG/ACT IN AERS
2.0000 | INHALATION_SPRAY | Freq: Once | RESPIRATORY_TRACT | Status: AC
  Administered 2016-07-05: 2 via RESPIRATORY_TRACT
  Filled 2016-07-05: qty 6.7

## 2016-07-05 MED ORDER — PREDNISONE 20 MG PO TABS
20.0000 mg | ORAL_TABLET | Freq: Two times a day (BID) | ORAL | 0 refills | Status: DC
Start: 2016-07-05 — End: 2017-03-06

## 2016-07-05 MED ORDER — DOXYCYCLINE HYCLATE 100 MG PO CAPS: 100.0000 mg | ORAL_CAPSULE | Freq: Two times a day (BID) | ORAL | 0 refills | Status: DC

## 2016-07-05 MED ORDER — AEROCHAMBER Z-STAT PLUS/MEDIUM MISC
1.0000 | Freq: Once | Status: DC
  Filled 2016-07-05: qty 1

## 2016-07-05 MED ORDER — IPRATROPIUM BROMIDE 0.02 % IN SOLN
0.5000 mg | Freq: Once | RESPIRATORY_TRACT | Status: AC
  Administered 2016-07-05: 0.5 mg via RESPIRATORY_TRACT
  Filled 2016-07-05: qty 2.5

## 2016-07-05 MED ORDER — OPTICHAMBER DIAMOND MISC
1.0000 | Freq: Once | Status: DC
  Filled 2016-07-05: qty 1

## 2016-07-05 MED ORDER — ALBUTEROL SULFATE (2.5 MG/3ML) 0.083% IN NEBU
5.0000 mg | INHALATION_SOLUTION | Freq: Once | RESPIRATORY_TRACT | Status: AC
  Administered 2016-07-05: 5 mg via RESPIRATORY_TRACT
  Filled 2016-07-05: qty 6

## 2016-07-05 MED ORDER — PREDNISONE 20 MG PO TABS
60.0000 mg | ORAL_TABLET | Freq: Once | ORAL | Status: AC
  Administered 2016-07-05: 60 mg via ORAL
  Filled 2016-07-05: qty 3
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
## 2016-07-05 NOTE — Discharge Instructions (Signed)
It is important to follow-up with a primary care doctor, to arrange for a repeat CAT scan of your chest to evaluate the right-sided pulmonary nodule that was found in May 2016.  Try to stop smoking.  Use the inhaler 2 puffs every 3-4 hours as needed for cough or trouble breathing.

## 2016-07-05 NOTE — ED Provider Notes (Signed)
[facility] DEPT Provider Note   CSN: [id number] Arrival date & time: 07/05/16  1344     History   Chief Complaint Chief Complaint  Patient presents with  . Fatigue  . Shortness of Breath    HPI Levi Alvarez is a 45 y.o. male.  He complains of fatigue, cough, sputum production, green in color, and generalized weakness for a least a year. He is concerned that he might have had exposure to black mold, causing his symptoms. He also has itchy eyes and runny nose, clear rhinorrhea. Is not having fever, chills, focal weakness or paresthesias. He continues to smoke cigarettes. There are no other known modifying factors.   HPI  Past Medical History:  Diagnosis Date  . Hypertension     There are no active problems to display for this patient.   Past Surgical History:  Procedure Laterality Date  . arm surgery    . KNEE SURGERY         Home Medications    Prior to Admission medications   Medication Sig Start Date End Date Taking? Authorizing Provider  albuterol (PROVENTIL HFA;VENTOLIN HFA) 108 (90 BASE) MCG/ACT inhaler Inhale 1 puff into the lungs every 6 (six) hours as needed for wheezing or shortness of breath.    Historical Provider, MD  cyclobenzaprine (FLEXERIL) 5 MG tablet Take 1 tablet (5 mg total) by mouth 3 (three) times daily as needed for muscle spasms. 04/24/15   Delos Haring, PA-C  HYDROcodone-acetaminophen (NORCO/VICODIN) 5-325 MG per tablet Take 1 tablet by mouth once. 04/24/15   Delos Haring, PA-C  HYDROcodone-acetaminophen (NORCO/VICODIN) 5-325 MG per tablet Take 2 tablets by mouth every 6 (six) hours as needed. 04/24/15   Tiffany Carlota Raspberry, PA-C  ibuprofen (ADVIL,MOTRIN) 800 MG tablet Take 1 tablet (800 mg total) by mouth 3 (three) times daily. Patient not taking: Reported on 03/15/2015 03/03/15   Doy Hutching, MD  oxyCODONE-acetaminophen (PERCOCET/ROXICET) 5-325 MG per tablet Take 2 tablets by mouth every 8 (eight) hours as needed for pain. Patient not  taking: Reported on 03/03/2015 09/07/12   Rosana Hoes, MD    Family History History reviewed. No pertinent family history.  Social History Social History  Substance Use Topics  . Smoking status: Current Every Day Smoker    Packs/day: 0.50    Types: Cigarettes  . Smokeless tobacco: Never Used  . Alcohol use Yes     Comment: occasional     Allergies   Naproxen   Review of Systems Review of Systems  All other systems reviewed and are negative.    Physical Exam Updated Vital Signs BP 157/96 (BP Location: Right Arm)   Pulse 74   Temp 98.1 F (36.7 C) (Oral)   Resp 14   Ht 5\' 11"  (1.803 m)   Wt 206 lb 9 oz (93.7 kg)   SpO2 100%   BMI 28.81 kg/m   Physical Exam  Constitutional: He is oriented to person, place, and time. He appears well-developed. He appears distressed (He appears uncomfortable).  Appears older than stated age.  HENT:  Head: Normocephalic and atraumatic.  Right Ear: External ear normal.  Left Ear: External ear normal.  Eyes: Conjunctivae and EOM are normal. Pupils are equal, round, and reactive to light.  Neck: Normal range of motion and phonation normal. Neck supple.  Cardiovascular: Normal rate, regular rhythm and normal heart sounds.   Pulmonary/Chest: Effort normal. No respiratory distress. He exhibits no bony tenderness.  Few scattered rhonchi and wheezes.  Abdominal: Soft. There is  no tenderness.  Musculoskeletal: Normal range of motion.  Neurological: He is alert and oriented to person, place, and time. No cranial nerve deficit or sensory deficit. He exhibits normal muscle tone. Coordination normal.  Skin: Skin is warm, dry and intact.  Psychiatric: He has a normal mood and affect. His behavior is normal. Judgment and thought content normal.  Nursing note and vitals reviewed.    ED Treatments / Results  Labs (all labs ordered are listed, but only abnormal results are displayed) Labs Reviewed  CBC - Abnormal; Notable for the following:         Result Value   MCV 100.6 (*)    MCH 34.3 (*)    All other components within normal limits  COMPREHENSIVE METABOLIC PANEL - Abnormal; Notable for the following:    Sodium 131 (*)    Chloride 98 (*)    AST 65 (*)    ALT 85 (*)    All other components within normal limits    EKG  EKG Interpretation  Date/Time:  Thursday July 05 2016 14:01:52 EDT Ventricular Rate:  82 PR Interval:  130 QRS Duration: 82 QT Interval:  336 QTC Calculation: 392 R Axis:   63 Text Interpretation:  Normal sinus rhythm with sinus arrhythmia Normal ECG since last tracing no significant change Confirmed by Eulis Foster  MD, Sunni Richardson [phone]) on 07/05/2016 5:52:45 PM       Radiology Dg Chest 2 View  Result Date: 07/05/2016 CLINICAL DATA:  Cough and shortness of breath for 1 month. EXAM: CHEST  2 VIEW COMPARISON:  03/03/2015 FINDINGS: Cardiac silhouette is normal in size. Normal mediastinal and hilar contours. Clear lungs.  No pleural effusion or pneumothorax. Skeletal structures are unremarkable other than mild thoracic spine degenerative change. IMPRESSION: No active cardiopulmonary disease. Electronically Signed   By: Lajean Manes M.D.   On: 07/05/2016 14:42    Procedures Procedures (including critical care time)  Medications Ordered in ED Medications  albuterol (PROVENTIL HFA;VENTOLIN HFA) 108 (90 Base) MCG/ACT inhaler 2 puff (not administered)  aerochamber Z-Stat Plus/medium 1 each (not administered)  albuterol (PROVENTIL) (2.5 MG/3ML) 0.083% nebulizer solution 5 mg (5 mg Nebulization Given 07/05/16 1928)  ipratropium (ATROVENT) nebulizer solution 0.5 mg (0.5 mg Nebulization Given 07/05/16 1928)  predniSONE (DELTASONE) tablet 60 mg (60 mg Oral Given 07/05/16 1927)     Initial Impression / Assessment and Plan / ED Course  I have reviewed the triage vital signs and the nursing notes.  Pertinent labs & imaging results that were available during my care of the patient were reviewed by me and considered  in my medical decision making (see chart for details).  Clinical Course    Medications  optichamber diamond 1 each (not administered)  albuterol (PROVENTIL) (2.5 MG/3ML) 0.083% nebulizer solution 5 mg (5 mg Nebulization Given 07/05/16 1928)  ipratropium (ATROVENT) nebulizer solution 0.5 mg (0.5 mg Nebulization Given 07/05/16 1928)  predniSONE (DELTASONE) tablet 60 mg (60 mg Oral Given 07/05/16 1927)  albuterol (PROVENTIL HFA;VENTOLIN HFA) 108 (90 Base) MCG/ACT inhaler 2 puff (2 puffs Inhalation Given 07/05/16 2018)    Patient Vitals for the past 24 hrs:  BP Temp Temp src Pulse Resp SpO2 Height Weight  07/05/16 1932 157/96 - - 74 14 100 % - -  07/05/16 1845 155/98 - - 71 22 95 % - -  07/05/16 1830 140/94 - - 70 17 97 % - -  07/05/16 1800 154/94 - - 69 - 98 % - -  07/05/16 1745 155/86 - -  65 - 96 % - -  07/05/16 1733 151/92 98.1 F (36.7 C) Oral 70 18 96 % - -  07/05/16 1639 140/86 - - 72 18 99 % - -  07/05/16 1349 (!) 184/106 98.3 F (36.8 C) Oral 96 18 99 % 5\' 11"  (1.803 m) 206 lb 9 oz (93.7 kg)    Patient discussed his prior recommendation to have a repeat CAT scan, for a right-sided pulmonary nodule, which was given to him in May 2016. He has not yet done that. He plans on seeing a primary care doctor soon and prefers to hold with the doctor to arrange for CT imaging. At this point that seems a reasonable plan.   8:41 PM Reevaluation with update and discussion. After initial assessment and treatment, an updated evaluation reveals he feels better after treatment with nebulizer. Findings discussed with patient and family members, all questions answered. Mahima Hottle L    Final Clinical Impressions(s) / ED Diagnoses   Final diagnoses:  Pain  Chronic fatigue  COPD exacerbation (HCC)  Tobacco abuse    Nonspecific symptoms, possibly allergic related or related to COPD exacerbation from smoking. Doubt sepsis, metabolic instability or impending vascular collapse.   Nursing Notes  Reviewed/ Care Coordinated Applicable Imaging Reviewed Interpretation of Laboratory Data incorporated into ED treatment  The patient appears reasonably screened and/or stabilized for discharge and I doubt any other medical condition or other [facility] requiring further screening, evaluation, or treatment in the ED at this time prior to discharge.  Plan: Home Medications- continue; Home Treatments- stop smoking; return here if the recommended treatment, does not improve the symptoms; Recommended follow up- PCP ASAP for follow-up on abnormal lung CT from May 2016.   New Prescriptions Discharge Medication List as of 07/05/2016  8:14 PM    START taking these medications   Details  doxycycline (VIBRAMYCIN) 100 MG capsule Take 1 capsule (100 mg total) by mouth 2 (two) times daily. One po bid x 7 days, Starting Thu 07/05/2016, Print    predniSONE (DELTASONE) 20 MG tablet Take 1 tablet (20 mg total) by mouth 2 (two) times daily., Starting Thu 07/05/2016, Print         Daleen Bo, MD 07/05/16 2043

## 2016-07-05 NOTE — ED Notes (Signed)
Pt departed in NAD, refused use of wheelchair.  

## 2017-03-06 ENCOUNTER — Ambulatory Visit (INDEPENDENT_AMBULATORY_CARE_PROVIDER_SITE_OTHER): Payer: 59

## 2017-03-06 ENCOUNTER — Encounter (HOSPITAL_COMMUNITY): Payer: Self-pay | Admitting: Emergency Medicine

## 2017-03-06 ENCOUNTER — Ambulatory Visit (HOSPITAL_COMMUNITY)
Admission: EM | Admit: 2017-03-06 | Discharge: 2017-03-06 | Disposition: A | Discharge status: Home / Self Care | Payer: 59 | Attending: Family Medicine | Admitting: Family Medicine

## 2017-03-06 DIAGNOSIS — R0602 Shortness of breath: Secondary | ICD-10-CM

## 2017-03-06 DIAGNOSIS — R062 Wheezing: Secondary | ICD-10-CM | POA: Diagnosis not present

## 2017-03-06 DIAGNOSIS — R05 Cough: Secondary | ICD-10-CM | POA: Diagnosis not present

## 2017-03-06 DIAGNOSIS — J320 Chronic maxillary sinusitis: Secondary | ICD-10-CM

## 2017-03-06 DIAGNOSIS — I10 Essential (primary) hypertension: Secondary | ICD-10-CM

## 2017-03-06 MED ORDER — HYDROCODONE-HOMATROPINE 5-1.5 MG/5ML PO SYRP: 5.0000 mL | ORAL_SOLUTION | Freq: Four times a day (QID) | ORAL | 0 refills | Status: DC | PRN

## 2017-03-06 MED ORDER — AMLODIPINE BESYLATE 5 MG PO TABS: 5.0000 mg | ORAL_TABLET | Freq: Every day | ORAL | 1 refills | Status: DC

## 2017-03-06 NOTE — ED Provider Notes (Signed)
[facility]    CSN: [id number] Arrival date & time: 03/06/17  1000     History   Chief Complaint Chief Complaint  Patient presents with  . URI    HPI Levi Alvarez is a 46 y.o. male.   Pt has been suffering from sinus congestion and pain for several weeks.  He now complains over the last week of a cough, wheezing, SOB, ringing in his ears, and coughing up mucus tinged with blood.  He also complains of body aches and possible fever.  Patient drinks 1-2 sixpacks of beer every night. He smokes half a pack to a pack of cigarettes every day. He works at [employer] as a Runner, broadcasting/film/video.  Patient has been taking nasal sprays to help open his sinuses but this has not worked. He's lost his sense of smell.      Past Medical History:  Diagnosis Date  . Hypertension     There are no active problems to display for this patient.   Past Surgical History:  Procedure Laterality Date  . arm surgery    . KNEE SURGERY         Home Medications    Prior to Admission medications   Medication Sig Start Date End Date Taking? Authorizing Provider  albuterol (PROVENTIL HFA;VENTOLIN HFA) 108 (90 BASE) MCG/ACT inhaler Inhale 1 puff into the lungs every 6 (six) hours as needed for wheezing or shortness of breath.    Historical Provider, MD  amLODipine (NORVASC) 5 MG tablet Take 1 tablet (5 mg total) by mouth daily. 03/06/17   Robyn Haber, MD  HYDROcodone-homatropine (HYDROMET) 5-1.5 MG/5ML syrup Take 5 mLs by mouth every 6 (six) hours as needed for cough. 03/06/17   Robyn Haber, MD    Family History History reviewed. No pertinent family history.  Social History Social History  Substance Use Topics  . Smoking status: Current Every Day Smoker    Packs/day: 0.50    Types: Cigarettes  . Smokeless tobacco: Never Used  . Alcohol use Yes     Comment: occasional     Allergies   Naproxen   Review of Systems Review of Systems  Constitutional: Negative.   HENT:  Positive for congestion and sore throat.   Respiratory: Positive for cough, shortness of breath and wheezing.   Cardiovascular: Negative.   Gastrointestinal: Negative.   Genitourinary: Negative.   Neurological: Negative.      Physical Exam Triage Vital Signs ED Triage Vitals  Enc Vitals Group     BP 03/06/17 1025 (!) 208/107     Pulse Rate 03/06/17 1025 96     Resp --      Temp 03/06/17 1025 98.9 F (37.2 C)     Temp Source 03/06/17 1025 Oral     SpO2 03/06/17 1025 96 %     Weight --      Height --      Head Circumference --      Peak Flow --      Pain Score 03/06/17 1027 5     Pain Loc --      Pain Edu? --      Excl. in [place]? --    No data found.   Updated Vital Signs BP (!) 208/107 (BP Location: Left Arm)   Pulse 96   Temp 98.9 F (37.2 C) (Oral)   SpO2 96%    Physical Exam  Constitutional: He is oriented to person, place, and time. He appears well-developed and well-nourished.  Odor not unlike that of alcohol on breath  HENT:  Right Ear: External ear normal.  Left Ear: External ear normal.  Throat is bright red Tympanic membranes are dull  Eyes: Conjunctivae and EOM are normal. Pupils are equal, round, and reactive to light.  Neck: Normal range of motion. Neck supple.  Cardiovascular: Normal rate, regular rhythm and normal heart sounds.   Pulmonary/Chest: Effort normal. He has wheezes.  Musculoskeletal: Normal range of motion.  Neurological: He is alert and oriented to person, place, and time. No cranial nerve deficit.  Skin: Skin is warm and dry.  Nursing note and vitals reviewed.    UC Treatments / Results  Labs (all labs ordered are listed, but only abnormal results are displayed) Labs Reviewed - No data to display  EKG  EKG Interpretation None       Radiology No acute findings on CXR Procedures Procedures (including critical care time)  Medications Ordered in UC Medications - No data to display   Initial Impression / Assessment and  Plan / UC Course  I have reviewed the triage vital signs and the nursing notes.  Pertinent labs & imaging results that were available during my care of the patient were reviewed by me and considered in my medical decision making (see chart for details).     Final Clinical Impressions(s) / UC Diagnoses   Final diagnoses:  Chronic maxillary sinusitis  Essential hypertension    New Prescriptions New Prescriptions   AMLODIPINE (NORVASC) 5 MG TABLET    Take 1 tablet (5 mg total) by mouth daily.   HYDROCODONE-HOMATROPINE (HYDROMET) 5-1.5 MG/5ML SYRUP    Take 5 mLs by mouth every 6 (six) hours as needed for cough.     Robyn Haber, MD 03/06/17 [phone]

## 2017-03-06 NOTE — Discharge Instructions (Signed)
You need to get your blood pressure rechecked in a week.

## 2017-03-06 NOTE — ED Triage Notes (Signed)
Pt has been suffering from sinus congestion and pain for several weeks.  He now complains over the last week of a cough, wheezing, SOB, ringing in his ears, and coughing up mucus tinged with blood.  He also complains of body aches and possible fever.

## 2017-06-05 ENCOUNTER — Ambulatory Visit (INDEPENDENT_AMBULATORY_CARE_PROVIDER_SITE_OTHER): Payer: 59 | Admitting: Medical

## 2017-06-05 ENCOUNTER — Encounter: Payer: Self-pay | Admitting: Medical

## 2017-06-05 ENCOUNTER — Telehealth: Payer: Self-pay | Admitting: Medical

## 2017-06-05 VITALS — BP 160/118 | HR 76 | Ht 68.5 in | Wt 234.4 lb

## 2017-06-05 DIAGNOSIS — R0683 Snoring: Secondary | ICD-10-CM | POA: Diagnosis not present

## 2017-06-05 DIAGNOSIS — R0989 Other specified symptoms and signs involving the circulatory and respiratory systems: Secondary | ICD-10-CM

## 2017-06-05 DIAGNOSIS — I160 Hypertensive urgency: Secondary | ICD-10-CM

## 2017-06-05 DIAGNOSIS — K921 Melena: Secondary | ICD-10-CM

## 2017-06-05 DIAGNOSIS — R51 Headache: Secondary | ICD-10-CM | POA: Diagnosis not present

## 2017-06-05 DIAGNOSIS — G894 Chronic pain syndrome: Secondary | ICD-10-CM

## 2017-06-05 DIAGNOSIS — Z8719 Personal history of other diseases of the digestive system: Secondary | ICD-10-CM | POA: Diagnosis not present

## 2017-06-05 DIAGNOSIS — F17200 Nicotine dependence, unspecified, uncomplicated: Secondary | ICD-10-CM | POA: Diagnosis not present

## 2017-06-05 DIAGNOSIS — R0681 Apnea, not elsewhere classified: Secondary | ICD-10-CM | POA: Diagnosis not present

## 2017-06-05 DIAGNOSIS — R519 Headache, unspecified: Secondary | ICD-10-CM

## 2017-06-05 DIAGNOSIS — Z8711 Personal history of peptic ulcer disease: Secondary | ICD-10-CM

## 2017-06-05 LAB — CBC WITH DIFFERENTIAL/PLATELET
Basophils Absolute: 65 cells/uL (ref 0–200)
Basophils Relative: 1 %
EOS PCT: 1 %
Eosinophils Absolute: 65 cells/uL (ref 15–500)
HCT: 42.3 % (ref 38.5–50.0)
HEMOGLOBIN: 14.4 g/dL (ref 13.2–17.1)
LYMPHS ABS: 1690 {cells}/uL (ref 850–3900)
Lymphocytes Relative: 26 %
MCH: 34.6 pg — ABNORMAL HIGH (ref 27.0–33.0)
MCHC: 34 g/dL (ref 32.0–36.0)
MCV: 101.7 fL — ABNORMAL HIGH (ref 80.0–100.0)
MPV: 10.4 fL (ref 7.5–12.5)
Monocytes Absolute: 780 cells/uL (ref 200–950)
Monocytes Relative: 12 %
NEUTROS PCT: 60 %
Neutro Abs: 3900 cells/uL (ref 1500–7800)
Platelets: 143 10*3/uL (ref 140–400)
RBC: 4.16 MIL/uL — ABNORMAL LOW (ref 4.20–5.80)
RDW: 14.7 % (ref 11.0–15.0)
WBC: 6.5 10*3/uL (ref 4.0–10.5)

## 2017-06-05 LAB — LIPID PANEL
Cholesterol: 220 mg/dL — ABNORMAL HIGH (ref <200)
HDL: 37 mg/dL — ABNORMAL LOW (ref >40)
LDL CALC: 160 mg/dL — AB (ref <100)
Total CHOL/HDL Ratio: 5.9 Ratio — ABNORMAL HIGH (ref <5.0)
Triglycerides: 116 mg/dL (ref <150)
VLDL: 23 mg/dL (ref <30)

## 2017-06-05 LAB — COMPREHENSIVE METABOLIC PANEL
ALK PHOS: 87 U/L (ref 40–115)
ALT: 158 U/L — AB (ref 9–46)
AST: 296 U/L — ABNORMAL HIGH (ref 10–40)
Albumin: 4 g/dL (ref 3.6–5.1)
BUN: 10 mg/dL (ref 7–25)
CO2: 19 mmol/L — ABNORMAL LOW (ref 20–31)
Calcium: 8.6 mg/dL (ref 8.6–10.3)
Chloride: 102 mmol/L (ref 98–110)
Creat: 0.64 mg/dL (ref 0.60–1.35)
GLUCOSE: 92 mg/dL (ref 65–99)
POTASSIUM: 4.2 mmol/L (ref 3.5–5.3)
SODIUM: 135 mmol/L (ref 135–146)
Total Bilirubin: 0.6 mg/dL (ref 0.2–1.2)
Total Protein: 8.2 g/dL — ABNORMAL HIGH (ref 6.1–8.1)

## 2017-06-05 LAB — TSH: TSH: 2.95 m[IU]/L (ref 0.40–4.50)

## 2017-06-05 MED ORDER — HYDROCHLOROTHIAZIDE 12.5 MG PO TABS: 12.5000 mg | ORAL_TABLET | Freq: Every day | ORAL | 2 refills | Status: DC

## 2017-06-05 MED ORDER — ALBUTEROL SULFATE HFA 108 (90 BASE) MCG/ACT IN AERS: 1.0000 | INHALATION_SPRAY | Freq: Four times a day (QID) | RESPIRATORY_TRACT | 1 refills | Status: DC | PRN

## 2017-06-05 MED ORDER — NEBIVOLOL HCL 5 MG PO TABS: 5.0000 mg | ORAL_TABLET | Freq: Every day | ORAL | 2 refills | Status: DC

## 2017-06-05 MED ORDER — TRAMADOL HCL 50 MG PO TABS: 50.0000 mg | ORAL_TABLET | Freq: Three times a day (TID) | ORAL | 0 refills | Status: DC | PRN

## 2017-06-05 NOTE — Telephone Encounter (Signed)
PT would like to get a refill on Albuterol. He forgot to ask for this today in his appointment.

## 2017-06-05 NOTE — Addendum Note (Signed)
Addended by: Carlena Hurl on: 06/05/2017 10:45 PM   Modules accepted: Orders

## 2017-06-05 NOTE — Telephone Encounter (Signed)
rx sent
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
## 2017-06-05 NOTE — Patient Instructions (Addendum)
Encounter Diagnoses  Name Primary?  . Hypertensive urgency Yes  . Smoker   . Witnessed apneic spells   . Snoring   . Decreased pulse   . Blood in stool   . History of gastric ulcer   . Chronic pain syndrome   . Chronic nonintractable headache, unspecified headache type    Recommendations:  I strongly encourage you to quit smoking!    I would also recommend cutting back on alcohol intake.  We will refer you for a sleep study as you likely have sleep apnea  Begin samples of Bystolic blood pressure medication 5mg , 1 tablet daily in the morning  Begin Hydrochlorothiazide 12.5mg  daily.   This is a fluid and BP medication. Take this in the morning as well  Cut out BC powders, goody powders and other Ibuprofen, motrin aleve type mediation  Use Tylenol for pain such as 500mg  Tylenol twice daily for pain.  For worse pain you can try the Ultram pain medication 1-2 times daily for worse pain  We will check labs today  Lets plan to see you back in 2 weeks    Hypertension Hypertension is another name for high blood pressure. High blood pressure forces your heart to work harder to pump blood. This can cause problems over time. There are two numbers in a blood pressure reading. There is a top number (systolic) over a bottom number (diastolic). It is best to have a blood pressure below 120/80. Healthy choices can help lower your blood pressure. You may need medicine to help lower your blood pressure if:  Your blood pressure cannot be lowered with healthy choices.  Your blood pressure is higher than 130/80.  Follow these instructions at home: Eating and drinking  If directed, follow the DASH eating plan. This diet includes: ? Filling half of your plate at each meal with fruits and vegetables. ? Filling one quarter of your plate at each meal with whole grains. Whole grains include whole wheat pasta, brown rice, and whole grain bread. ? Eating or drinking low-fat dairy products, such as  skim milk or low-fat yogurt. ? Filling one quarter of your plate at each meal with low-fat (lean) proteins. Low-fat proteins include fish, skinless chicken, eggs, beans, and tofu. ? Avoiding fatty meat, cured and processed meat, or chicken with skin. ? Avoiding premade or processed food.  Eat less than 1,500 mg of salt (sodium) a day.  Limit alcohol use to no more than 1 drink a day for nonpregnant women and 2 drinks a day for men. One drink equals 12 oz of beer, 5 oz of wine, or 1 oz of hard liquor. Lifestyle  Work with your doctor to stay at a healthy weight or to lose weight. Ask your doctor what the best weight is for you.  Get at least 30 minutes of exercise that causes your heart to beat faster (aerobic exercise) most days of the week. This may include walking, swimming, or biking.  Get at least 30 minutes of exercise that strengthens your muscles (resistance exercise) at least 3 days a week. This may include lifting weights or pilates.  Do not use any products that contain nicotine or tobacco. This includes cigarettes and e-cigarettes. If you need help quitting, ask your doctor.  Check your blood pressure at home as told by your doctor.  Keep all follow-up visits as told by your doctor. This is important. Medicines  Take over-the-counter and prescription medicines only as told by your doctor. Follow directions carefully.  Do not skip doses of blood pressure medicine. The medicine does not work as well if you skip doses. Skipping doses also puts you at risk for problems.  Ask your doctor about side effects or reactions to medicines that you should watch for. Contact a doctor if:  You think you are having a reaction to the medicine you are taking.  You have headaches that keep coming back (recurring).  You feel dizzy.  You have swelling in your ankles.  You have trouble with your vision. Get help right away if:  You get a very bad headache.  You start to feel  confused.  You feel weak or numb.  You feel faint.  You get very bad pain in your: ? Chest. ? Belly (abdomen).  You throw up (vomit) more than once.  You have trouble breathing. Summary  Hypertension is another name for high blood pressure.  Making healthy choices can help lower blood pressure. If your blood pressure cannot be controlled with healthy choices, you may need to take medicine. This information is not intended to replace advice given to you by your health care provider. Make sure you discuss any questions you have with your health care provider. Document Released: 04/16/2008 Document Revised: 09/26/2016 Document Reviewed: 09/26/2016 Elsevier Interactive Patient Education  2018 [employer] [place].     Sleep Apnea Sleep apnea is a condition in which breathing pauses or becomes shallow during sleep. Episodes of sleep apnea usually last 10 seconds or longer, and they may occur as many as 20 times an hour. Sleep apnea disrupts your sleep and keeps your body from getting the rest that it needs. This condition can increase your risk of certain health problems, including:  Heart attack.  Stroke.  Obesity.  Diabetes.  Heart failure.  Irregular heartbeat.  There are three kinds of sleep apnea:  Obstructive sleep apnea. This kind is caused by a blocked or collapsed airway.  Central sleep apnea. This kind happens when the part of the brain that controls breathing does not send the correct signals to the muscles that control breathing.  Mixed sleep apnea. This is a combination of obstructive and central sleep apnea.  What are the causes? The most common cause of this condition is a collapsed or blocked airway. An airway can collapse or become blocked if:  Your throat muscles are abnormally relaxed.  Your tongue and tonsils are larger than normal.  You are overweight.  Your airway is smaller than normal.  What increases the risk? This condition is more likely to  develop in people who:  Are overweight.  Smoke.  Have a smaller than normal airway.  Are elderly.  Are male.  Drink alcohol.  Take sedatives or tranquilizers.  Have a family history of sleep apnea.  What are the signs or symptoms? Symptoms of this condition include:  Trouble staying asleep.  Daytime sleepiness and tiredness.  Irritability.  Loud snoring.  Morning headaches.  Trouble concentrating.  Forgetfulness.  Decreased interest in sex.  Unexplained sleepiness.  Mood swings.  Personality changes.  Feelings of depression.  Waking up often during the night to urinate.  Dry mouth.  Sore throat.  How is this diagnosed? This condition may be diagnosed with:  A medical history.  A physical exam.  A series of tests that are done while you are sleeping (sleep study). These tests are usually done in a sleep lab, but they may also be done at home.  How is this treated? Treatment for this  condition aims to restore normal breathing and to ease symptoms during sleep. It may involve managing health issues that can affect breathing, such as high blood pressure or obesity. Treatment may include:  Sleeping on your side.  Using a decongestant if you have nasal congestion.  Avoiding the use of depressants, including alcohol, sedatives, and narcotics.  Losing weight if you are overweight.  Making changes to your diet.  Quitting smoking.  Using a device to open your airway while you sleep, such as: ? An oral appliance. This is a custom-made mouthpiece that shifts your lower jaw forward. ? A continuous positive airway pressure (CPAP) device. This device delivers oxygen to your airway through a mask. ? A nasal expiratory positive airway pressure (EPAP) device. This device has valves that you put into each nostril. ? A bi-level positive airway pressure (BPAP) device. This device delivers oxygen to your airway through a mask.  Surgery if other treatments do  not work. During surgery, excess tissue is removed to create a wider airway.  It is important to get treatment for sleep apnea. Without treatment, this condition can lead to:  High blood pressure.  Coronary artery disease.  (Men) An inability to achieve or maintain an erection (impotence).  Reduced thinking abilities.  Follow these instructions at home:  Make any lifestyle changes that your health care provider recommends.  Eat a healthy, well-balanced diet.  Take over-the-counter and prescription medicines only as told by your health care provider.  Avoid using depressants, including alcohol, sedatives, and narcotics.  Take steps to lose weight if you are overweight.  If you were given a device to open your airway while you sleep, use it only as told by your health care provider.  Do not use any tobacco products, such as cigarettes, chewing tobacco, and e-cigarettes. If you need help quitting, ask your health care provider.  Keep all follow-up visits as told by your health care provider. This is important. Contact a health care provider if:  The device that you received to open your airway during sleep is uncomfortable or does not seem to be working.  Your symptoms do not improve.  Your symptoms get worse. Get help right away if:  You develop chest pain.  You develop shortness of breath.  You develop discomfort in your back, arms, or stomach.  You have trouble speaking.  You have weakness on one side of your body.  You have drooping in your face. These symptoms may represent a serious problem that is an emergency. Do not wait to see if the symptoms will go away. Get medical help right away. Call your local emergency services (911 in the [place] Do not drive yourself to the hospital. This information is not intended to replace advice given to you by your health care provider. Make sure you discuss any questions you have with your health care provider. Document  Released: 10/19/2002 Document Revised: 06/24/2016 Document Reviewed: 08/08/2015 Elsevier Interactive Patient Education  [employer].

## 2017-06-06 LAB — HEMOGLOBIN A1C
Hgb A1c MFr Bld: 5.2 % (ref <5.7)
MEAN PLASMA GLUCOSE: 103 mg/dL

## 2017-06-07 ENCOUNTER — Telehealth: Payer: Self-pay

## 2017-06-07 NOTE — Telephone Encounter (Signed)
Ok, lets get him back in 2wk for f/u.   Make sure we referred yesterday to sleep study/[place]

## 2017-06-07 NOTE — Telephone Encounter (Signed)
Spoke with pt's gf [name] (on [employer]) she states the Bystolic is over $43/XUCJA, but they have samples to last a month. She states that he will need to be switched to something else after they run out of samples because he will not be able to afford the medication. Victorino December

## 2017-06-07 NOTE — Telephone Encounter (Signed)
Pt is scheduled for 2 week f/u, please double check referral for sleep study.

## 2017-06-10 ENCOUNTER — Other Ambulatory Visit: Payer: Self-pay

## 2017-06-10 DIAGNOSIS — R0683 Snoring: Secondary | ICD-10-CM

## 2017-06-10 NOTE — Telephone Encounter (Signed)
I don't understand why we get these.  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

## 2017-06-10 NOTE — Telephone Encounter (Signed)
The sleep lab from  called  Stated that his insurance [employer] will not cover a lab sleep study , however they will cover home study. Changed the order to home sleep test  and sent it to  the sleep lab.

## 2017-06-11 NOTE — Telephone Encounter (Signed)
This was just an fyi they do check with the insurance compamy. I was just letting you know that I  switch it to home study. For insurance to cover it

## 2017-06-21 ENCOUNTER — Ambulatory Visit: Payer: 59 | Admitting: Medical

## 2017-06-27 ENCOUNTER — Telehealth: Payer: Self-pay | Admitting: Medical

## 2017-06-27 ENCOUNTER — Other Ambulatory Visit: Payer: Self-pay | Admitting: Medical

## 2017-06-27 MED ORDER — ALBUTEROL SULFATE HFA 108 (90 BASE) MCG/ACT IN AERS: 1.0000 | INHALATION_SPRAY | Freq: Four times a day (QID) | RESPIRATORY_TRACT | 0 refills | Status: DC | PRN

## 2017-06-27 MED ORDER — CARVEDILOL 3.125 MG PO TABS: 3.1250 mg | ORAL_TABLET | Freq: Two times a day (BID) | ORAL | 0 refills | Status: DC

## 2017-06-27 NOTE — Telephone Encounter (Signed)
1- he was due back for appt 2 wk from last appt which would have been last week.  Thus, he needs appt for f/u on BP and breathing 2-I sent proair as option for albuterol, I sent Coreg to begin INSTEAD of Bystolic  F/u for appt.   Not sure if those 2 options above are cheaper or not, depends upon his insurance.

## 2017-06-27 NOTE — Telephone Encounter (Signed)
Pt's friend, [name], called stating that Bystolic & Albuterol were $50 each. Pt wants to know what else he may be able to take instead of these meds. Call pt at [phone] or [name] (she is on HIPAA) at [phone].

## 2017-06-28 ENCOUNTER — Ambulatory Visit (HOSPITAL_BASED_OUTPATIENT_CLINIC_OR_DEPARTMENT_OTHER): Payer: 59 | Attending: Medical | Admitting: Internal Medicine

## 2017-06-28 DIAGNOSIS — G4736 Sleep related hypoventilation in conditions classified elsewhere: Secondary | ICD-10-CM | POA: Insufficient documentation

## 2017-06-28 DIAGNOSIS — R0683 Snoring: Secondary | ICD-10-CM | POA: Insufficient documentation

## 2017-06-28 DIAGNOSIS — G4733 Obstructive sleep apnea (adult) (pediatric): Secondary | ICD-10-CM | POA: Diagnosis not present

## 2017-07-02 ENCOUNTER — Other Ambulatory Visit: Payer: Self-pay | Admitting: Medical

## 2017-07-02 NOTE — Telephone Encounter (Signed)
Can pt have a refill on this 

## 2017-07-02 NOTE — Telephone Encounter (Signed)
Okay 

## 2017-07-02 NOTE — Telephone Encounter (Signed)
Called in.

## 2017-07-03 ENCOUNTER — Other Ambulatory Visit (HOSPITAL_BASED_OUTPATIENT_CLINIC_OR_DEPARTMENT_OTHER): Payer: Self-pay

## 2017-07-03 DIAGNOSIS — R0683 Snoring: Secondary | ICD-10-CM

## 2017-07-06 DIAGNOSIS — G4733 Obstructive sleep apnea (adult) (pediatric): Secondary | ICD-10-CM | POA: Diagnosis not present

## 2017-07-06 NOTE — Procedures (Signed)
  Patient Name: Levi Alvarez, Levi Alvarez Date: 06/30/2017 Gender: Male D.O.B: 1971-04-04 Age (years): 46 Referring Provider: Chana Bode Height (inches): 70 Interpreting Physician: Baird Lyons MD, ABSM Weight (lbs): 230 RPSGT: Jacolyn Reedy BMI: 35 MRN: [id number] Neck Size: 18.50 CLINICAL INFORMATION Sleep Study Type: unattended HST  Indication for sleep study: Snoring  Epworth Sleepiness Score: 14  SLEEP STUDY TECHNIQUE A multi-channel overnight portable sleep study was performed. The channels recorded were: nasal airflow, thoracic respiratory movement, and oxygen saturation with a pulse oximetry. Snoring was also monitored.  MEDICATIONS Patient self administered medications include: none reported.  SLEEP ARCHITECTURE Patient was studied for 387.5 minutes. The sleep efficiency was 100.0 % and the patient was supine for 49.4%. The arousal index was 0.0 per hour.  RESPIRATORY PARAMETERS The overall AHI was 49.9 per hour, with a central apnea index of 0.0 per hour.  The oxygen nadir was 67% during sleep.  CARDIAC DATA Mean heart rate during sleep was 62.1 bpm.  IMPRESSIONS - Severe obstructive sleep apnea occurred during this study (AHI = 49.9/h). - No significant central sleep apnea occurred during this study (CAI = 0.0/h). - Severe oxygen desaturation was noted during this study (Min O2 = 67%, Mean 90%). - Patient snored .  DIAGNOSIS - Obstructive Sleep Apnea (327.23 [G47.33 ICD-10]) - Nocturnal Hypoxemia (327.26 [G47.36 ICD-10])  RECOMMENDATIONS - CPAP titration is recommended with scores in this range. Other options will be based on clinical judgment. - Be careful with alcohol, sedatives and other CNS depressants that may worsen sleep apnea and disrupt normal sleep architecture. - Sleep hygiene should be reviewed to assess factors that may improve sleep quality. - Weight management and regular exercise should be initiated or continued.  [Electronically  signed] 07/06/2017 09:41 AM  Baird Lyons MD, ABSM Diplomate, American Board of Sleep Medicine   NPI: [id number]  [place], American Board of Sleep Medicine  ELECTRONICALLY SIGNED ON:  07/06/2017, 9:38 AM [place] PH: [phone]   FX: [phone] [place]

## 2017-07-07 ENCOUNTER — Telehealth: Payer: Self-pay | Admitting: Medical

## 2017-07-07 NOTE — Telephone Encounter (Signed)
Send referral for CPAP supplies and get in for discussion of sleep study, recheck from his first visit

## 2017-07-08 NOTE — Telephone Encounter (Signed)
Pt already has an appt on the 07/12/2017

## 2017-07-12 ENCOUNTER — Ambulatory Visit (INDEPENDENT_AMBULATORY_CARE_PROVIDER_SITE_OTHER): Payer: 59 | Admitting: Medical

## 2017-07-12 ENCOUNTER — Encounter: Payer: Self-pay | Admitting: Medical

## 2017-07-12 VITALS — BP 134/88 | HR 83 | Wt 239.0 lb

## 2017-07-12 DIAGNOSIS — G4733 Obstructive sleep apnea (adult) (pediatric): Secondary | ICD-10-CM

## 2017-07-12 DIAGNOSIS — E785 Hyperlipidemia, unspecified: Secondary | ICD-10-CM | POA: Diagnosis not present

## 2017-07-12 DIAGNOSIS — F17200 Nicotine dependence, unspecified, uncomplicated: Secondary | ICD-10-CM | POA: Insufficient documentation

## 2017-07-12 DIAGNOSIS — IMO0001 Reserved for inherently not codable concepts without codable children: Secondary | ICD-10-CM

## 2017-07-12 DIAGNOSIS — M2550 Pain in unspecified joint: Secondary | ICD-10-CM | POA: Insufficient documentation

## 2017-07-12 DIAGNOSIS — I159 Secondary hypertension, unspecified: Secondary | ICD-10-CM | POA: Diagnosis not present

## 2017-07-12 DIAGNOSIS — R748 Abnormal levels of other serum enzymes: Secondary | ICD-10-CM | POA: Insufficient documentation

## 2017-07-12 DIAGNOSIS — E669 Obesity, unspecified: Secondary | ICD-10-CM | POA: Diagnosis not present

## 2017-07-12 DIAGNOSIS — R0683 Snoring: Secondary | ICD-10-CM | POA: Diagnosis not present

## 2017-07-12 DIAGNOSIS — Z6835 Body mass index (BMI) 35.0-35.9, adult: Secondary | ICD-10-CM

## 2017-07-12 DIAGNOSIS — I10 Essential (primary) hypertension: Secondary | ICD-10-CM | POA: Insufficient documentation

## 2017-07-12 DIAGNOSIS — G8929 Other chronic pain: Secondary | ICD-10-CM | POA: Insufficient documentation

## 2017-07-12 MED ORDER — CHLORTHALIDONE 25 MG PO TABS: 25.0000 mg | ORAL_TABLET | Freq: Every day | ORAL | 2 refills | Status: DC

## 2017-07-12 MED ORDER — CARVEDILOL 3.125 MG PO TABS
3.1250 mg | ORAL_TABLET | Freq: Two times a day (BID) | ORAL | 1 refills | Status: DC
Start: 2017-07-12 — End: 2017-08-30

## 2017-07-12 MED ORDER — POTASSIUM CHLORIDE ER 10 MEQ PO TBCR: 10.0000 meq | EXTENDED_RELEASE_TABLET | Freq: Every day | ORAL | 2 refills | Status: DC
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
## 2017-07-13 LAB — HEPATIC FUNCTION PANEL
ALT: 164 U/L — AB (ref 9–46)
AST: 284 U/L — AB (ref 10–40)
Albumin: 4 g/dL (ref 3.6–5.1)
Alkaline Phosphatase: 96 U/L (ref 40–115)
BILIRUBIN DIRECT: 0.2 mg/dL (ref <=0.2)
BILIRUBIN INDIRECT: 0.3 mg/dL (ref 0.2–1.2)
TOTAL PROTEIN: 8 g/dL (ref 6.1–8.1)
Total Bilirubin: 0.5 mg/dL (ref 0.2–1.2)

## 2017-07-13 LAB — IRON AND TIBC
%SAT: 27 % (ref 15–60)
IRON: 115 ug/dL (ref 50–180)
TIBC: 433 ug/dL — ABNORMAL HIGH (ref 250–425)
UIBC: 318 ug/dL

## 2017-07-13 LAB — HEPATITIS B SURFACE ANTIGEN: Hepatitis B Surface Ag: NONREACTIVE

## 2017-07-13 LAB — HEPATITIS C ANTIBODY: HCV AB: REACTIVE — AB

## 2017-07-19 LAB — HEPATITIS C RNA QUANTITATIVE
HCV Quantitative Log: 6.81 Log IU/mL — ABNORMAL HIGH
HCV Quantitative: 6460000 IU/mL — ABNORMAL HIGH

## 2017-07-22 ENCOUNTER — Other Ambulatory Visit: Payer: Self-pay | Admitting: Medical

## 2017-07-22 ENCOUNTER — Other Ambulatory Visit: Payer: Self-pay

## 2017-07-22 DIAGNOSIS — B182 Chronic viral hepatitis C: Secondary | ICD-10-CM

## 2017-07-22 DIAGNOSIS — B1920 Unspecified viral hepatitis C without hepatic coma: Secondary | ICD-10-CM

## 2017-07-23 ENCOUNTER — Other Ambulatory Visit: Payer: Self-pay

## 2017-07-23 ENCOUNTER — Telehealth: Payer: Self-pay | Admitting: Medical

## 2017-07-23 DIAGNOSIS — R0683 Snoring: Secondary | ICD-10-CM

## 2017-07-23 DIAGNOSIS — G4733 Obstructive sleep apnea (adult) (pediatric): Secondary | ICD-10-CM

## 2017-07-23 DIAGNOSIS — M25562 Pain in left knee: Secondary | ICD-10-CM

## 2017-07-23 DIAGNOSIS — G8929 Other chronic pain: Secondary | ICD-10-CM | POA: Insufficient documentation

## 2017-07-23 NOTE — Telephone Encounter (Signed)
Called and l/m for andy  With [facility] to look into this

## 2017-07-23 NOTE — Telephone Encounter (Signed)
See message from yesterday, but he also hasn't heard back from CPAP supplier.   This is unacceptable!  We need to hold the CPAP suppliers more accountable as we have been getting less than desirable service from them.

## 2017-07-23 NOTE — Telephone Encounter (Signed)
Spoke with Levi Alvarez his working on gettting patient set up

## 2017-07-29 ENCOUNTER — Other Ambulatory Visit: Payer: Self-pay | Admitting: Family Medicine

## 2017-07-29 ENCOUNTER — Other Ambulatory Visit: Payer: Self-pay | Admitting: Medical

## 2017-07-29 NOTE — Telephone Encounter (Signed)
This is your patient.

## 2017-07-30 NOTE — Telephone Encounter (Signed)
Call this out, no refill

## 2017-08-02 ENCOUNTER — Ambulatory Visit
Admission: RE | Admit: 2017-08-02 | Discharge: 2017-08-02 | Disposition: A | Discharge status: Home / Self Care | Payer: 59 | Source: Ambulatory Visit | Attending: Medical | Admitting: Medical

## 2017-08-02 DIAGNOSIS — B182 Chronic viral hepatitis C: Secondary | ICD-10-CM

## 2017-08-12 ENCOUNTER — Ambulatory Visit: Payer: 59 | Admitting: Medical

## 2017-08-13 ENCOUNTER — Telehealth: Payer: Self-pay | Admitting: Medical

## 2017-08-13 NOTE — Telephone Encounter (Signed)
Levi Alvarez, can you call and get some clarification.    Per the phone call, he lost his job "because he was diagnosed with Hep C," doesn't sound correct.  If he truly was fired or let go on behalf of a medical diagnosis unrelated to his job then I would recommend he see legal counsel on how to proceed as this may violate workplace rights.   If he was laid off in general, I would think he should pay for [employer] to cover him until he gets insurance through another job.   The way the call came in just sounded unusual.    We certainly can refer to the Adult Wellness clinic through [facility] for people without insurance if needed.   Specifically, [facility] has a hepatitis clinic that people without insurance can possible be referred to.    Levi Alvarez, can you help get clarification before we decide on next steps such as [facility] referral.

## 2017-08-13 NOTE — Telephone Encounter (Signed)
[name], pt's girlfriend, called. She is on pt's Hipaa. She states that pt recently lost his job apparently due to his Hep C diagnosis. He now has no income or insurance.  He does not qualify for medicaid. He needs to start his treatment but the clinic he was referred to requires a upfront payment of $400.00. He can not afford that. He needs to know where to go at this point. Please advise [name] at [phone].

## 2017-08-15 NOTE — Telephone Encounter (Signed)
Forwarding to [place]

## 2017-08-15 NOTE — Telephone Encounter (Signed)
[name] called again, so I called her back as she is on pt HIPAA.  She states pt was laid off because he was physically unable to do the work at [employer] site and that his knee is jacked up.  He did not get laid off because of HEP C.  They cannot afford COBRA.  They are trying for the orange card, disability and unemployement.  They would like the referral to the [facility] clinic that you mentioned.  Also they called the Adult Wellness and was told Nov 1st is when they should call back, do you have a way to get them in sooner?  Call [place] back at [phone] as pt number does not get good reception.

## 2017-08-15 NOTE — Telephone Encounter (Signed)
Called pt reached voice mail lmtrc as more details are needed.

## 2017-08-16 NOTE — Telephone Encounter (Signed)
Lets refer to [facility] and make referral to [facility] clinic

## 2017-08-19 ENCOUNTER — Ambulatory Visit: Payer: 59 | Admitting: Medical

## 2017-08-21 NOTE — Telephone Encounter (Signed)
Sent referral to the hep c clinic at [facility]

## 2017-08-23 ENCOUNTER — Telehealth: Payer: Self-pay

## 2017-08-23 NOTE — Telephone Encounter (Signed)
Note rcvd from [facility] Hep C clinic that they need a Hep C genotype done before they can schedule pt for referral. I have placed note in your folder for review. Levi Alvarez Fax # [phone]. Levi Alvarez

## 2017-08-26 ENCOUNTER — Other Ambulatory Visit: Payer: Self-pay | Admitting: Medical

## 2017-08-30 ENCOUNTER — Other Ambulatory Visit: Payer: Self-pay | Admitting: Medical

## 2017-08-30 ENCOUNTER — Other Ambulatory Visit: Payer: Self-pay

## 2017-08-30 ENCOUNTER — Telehealth: Payer: Self-pay | Admitting: Medical

## 2017-08-30 DIAGNOSIS — I159 Secondary hypertension, unspecified: Secondary | ICD-10-CM

## 2017-08-30 MED ORDER — HYDROCHLOROTHIAZIDE 25 MG PO TABS: 25.0000 mg | ORAL_TABLET | Freq: Every day | ORAL | 2 refills | Status: DC

## 2017-08-30 MED ORDER — CARVEDILOL 3.125 MG PO TABS: 3.1250 mg | ORAL_TABLET | Freq: Two times a day (BID) | ORAL | 1 refills | Status: DC

## 2017-08-30 NOTE — Telephone Encounter (Signed)
I changed Chlorthalidone to Hydroclhorothiazide

## 2017-08-30 NOTE — Telephone Encounter (Signed)
Sent in refill for  Carvedilol to  [facility]. Pt was to know if there is a  Substitute chlorthalidone.  Also informed her of his referral to [facility] been sent an is in review.

## 2017-08-30 NOTE — Telephone Encounter (Addendum)
Pt's girlfriend, [name], called requesting for Carvedilol script to transferred to [place] at [facility] since pt can get it for $4. He plans to buy Potassium OTC to save money. They want to know if there may be a substitute for Chlorthalidone that may be on the $4 med list at [facility] that pt can get since Chlorthalidone is not on the list and it cost $30 for a 30 day supply.  Also pt has not heard anything from [facility] [employer] at [phone]

## 2017-09-02 NOTE — Telephone Encounter (Signed)
Okay 

## 2017-09-04 ENCOUNTER — Other Ambulatory Visit: Payer: Self-pay | Admitting: Medical

## 2017-09-04 MED ORDER — TRAMADOL HCL 50 MG PO TABS: 50.0000 mg | ORAL_TABLET | Freq: Three times a day (TID) | ORAL | 0 refills | Status: DC | PRN

## 2017-09-04 NOTE — Telephone Encounter (Signed)
Let me review the chart and I'll decide

## 2017-09-04 NOTE — Telephone Encounter (Signed)
Ok will call [facility] to check the status of referral

## 2017-09-04 NOTE — Telephone Encounter (Signed)
Rx was printed so I called Tramadol into [facility] and also switched HCTz to [place] per [name]'s request.

## 2017-09-04 NOTE — Telephone Encounter (Signed)
Pt 's girlfriend called and wants to know if he can refill on his tramadol, and wants to know if he can have a higher dose of the hydrochlorothiazide because she said that the 25mg  didn't help with swelling the last time.

## 2017-09-04 NOTE — Telephone Encounter (Signed)
I did a refill on Ultram, but this isn't a good long term solution.  What is status on insurance, hepatitis clinic appointment?  If no insurance, have we tried to refer to [facility] for access to care without insurance?  He is going to need f/u here or with [facility].

## 2017-09-05 ENCOUNTER — Other Ambulatory Visit: Payer: 59

## 2017-09-05 ENCOUNTER — Other Ambulatory Visit: Payer: Self-pay | Admitting: Medical

## 2017-09-05 DIAGNOSIS — K739 Chronic hepatitis, unspecified: Secondary | ICD-10-CM

## 2017-09-05 NOTE — Telephone Encounter (Signed)
Have him come in for follow up.  He will need another test called a genotype before hepatitis clinic will see him.  Louretta Shorten - ask Sandy why the viral load test Hep C RNA doesn't automatically do genotype?  Or is there a different test code that has a reflex component.?

## 2017-09-05 NOTE — Telephone Encounter (Signed)
Called and spoke with [facility] , they have his referral but needing additional  Lab.  He need to he need to have hep c geno type to  Determine with type of medicine he needs to be treated with.  Can you put in the order for this

## 2017-09-06 NOTE — Telephone Encounter (Signed)
Let him know our predicament.  [facility] won't see him without the other test which helps to narrow down the type of Hep C.  The test is $700, which I assume would be hard to pay for at this time?    Does he have insurance at this time?    I would recommend he come in to discuss options and follow up from all the recent phone calls

## 2017-09-06 NOTE — Telephone Encounter (Signed)
Spoke with sandy the hep c genotype is $700.00 .

## 2017-09-09 ENCOUNTER — Other Ambulatory Visit: Payer: 59

## 2017-09-09 NOTE — Telephone Encounter (Signed)
Called andl/m for pt to call us back. 

## 2017-09-09 NOTE — Telephone Encounter (Signed)
Called and l/m on norma voicemail

## 2017-09-10 NOTE — Telephone Encounter (Signed)
Spoke with pt 's girlfriend she said that he now has the orange card , but we didn't honor per [employer]	[place]. The orange card will have a list of provides that will take this. She said that she list at the list and go from there.

## 2017-09-12 ENCOUNTER — Telehealth: Payer: Self-pay | Admitting: Medical

## 2017-09-12 NOTE — Telephone Encounter (Signed)
Pt's girlfriend, Zollie Beckers, called stating that [facility] is requesting a referral along with a copy of labs to show that pt has Hep C faxed to their office & Dr Linus Salmons at [phone]. [name] requesting a phone call at [phone] when this is done so that she can call [facility] to make an appointment for pt.

## 2017-09-12 NOTE — Telephone Encounter (Signed)
Can this pt have a referral

## 2017-09-13 NOTE — Telephone Encounter (Signed)
Faxed referral to Dr.Comer

## 2017-09-20 ENCOUNTER — Encounter: Payer: Self-pay | Admitting: Nurse Practitioner

## 2017-09-20 ENCOUNTER — Ambulatory Visit: Payer: Self-pay | Attending: Nurse Practitioner | Admitting: Nurse Practitioner

## 2017-09-20 VITALS — BP 117/79 | HR 73 | Temp 97.7°F | Resp 18 | Ht 71.0 in | Wt 247.4 lb

## 2017-09-20 DIAGNOSIS — B1920 Unspecified viral hepatitis C without hepatic coma: Secondary | ICD-10-CM | POA: Insufficient documentation

## 2017-09-20 DIAGNOSIS — G4733 Obstructive sleep apnea (adult) (pediatric): Secondary | ICD-10-CM | POA: Insufficient documentation

## 2017-09-20 DIAGNOSIS — Z79899 Other long term (current) drug therapy: Secondary | ICD-10-CM | POA: Insufficient documentation

## 2017-09-20 DIAGNOSIS — Z823 Family history of stroke: Secondary | ICD-10-CM | POA: Insufficient documentation

## 2017-09-20 DIAGNOSIS — F17210 Nicotine dependence, cigarettes, uncomplicated: Secondary | ICD-10-CM | POA: Insufficient documentation

## 2017-09-20 DIAGNOSIS — Z0001 Encounter for general adult medical examination with abnormal findings: Secondary | ICD-10-CM | POA: Insufficient documentation

## 2017-09-20 DIAGNOSIS — F17218 Nicotine dependence, cigarettes, with other nicotine-induced disorders: Secondary | ICD-10-CM

## 2017-09-20 DIAGNOSIS — Z833 Family history of diabetes mellitus: Secondary | ICD-10-CM | POA: Insufficient documentation

## 2017-09-20 DIAGNOSIS — J449 Chronic obstructive pulmonary disease, unspecified: Secondary | ICD-10-CM | POA: Insufficient documentation

## 2017-09-20 DIAGNOSIS — Z8249 Family history of ischemic heart disease and other diseases of the circulatory system: Secondary | ICD-10-CM | POA: Insufficient documentation

## 2017-09-20 DIAGNOSIS — G4700 Insomnia, unspecified: Secondary | ICD-10-CM | POA: Insufficient documentation

## 2017-09-20 DIAGNOSIS — I10 Essential (primary) hypertension: Secondary | ICD-10-CM | POA: Insufficient documentation

## 2017-09-20 MED ORDER — ALBUTEROL SULFATE HFA 108 (90 BASE) MCG/ACT IN AERS
1.0000 | INHALATION_SPRAY | Freq: Four times a day (QID) | RESPIRATORY_TRACT | 0 refills | Status: DC | PRN
Start: 2017-09-20 — End: 2018-06-19

## 2017-09-20 MED ORDER — POTASSIUM CHLORIDE ER 10 MEQ PO TBCR: 10.0000 meq | EXTENDED_RELEASE_TABLET | Freq: Every day | ORAL | 0 refills | Status: DC

## 2017-09-20 MED ORDER — IPRATROPIUM-ALBUTEROL 0.5-2.5 (3) MG/3ML IN SOLN
3.0000 mL | Freq: Once | RESPIRATORY_TRACT | Status: AC
  Administered 2017-09-20: 3 mL via RESPIRATORY_TRACT

## 2017-09-20 MED ORDER — FLUTICASONE FUROATE-VILANTEROL 200-25 MCG/INH IN AEPB: 1.0000 | INHALATION_SPRAY | Freq: Every day | RESPIRATORY_TRACT | 1 refills | Status: DC

## 2017-09-20 MED ORDER — CHLORTHALIDONE 25 MG PO TABS: 25.0000 mg | ORAL_TABLET | ORAL | 0 refills | Status: DC

## 2017-09-20 MED FILL — !BREO ELLIPTA 200-25 MCG: 200-25 | 30 days supply | Qty: 60 | Fill #0

## 2017-09-20 MED FILL — ?CHLORTHALIDONE 25 MG TABLE: 25 | 30 days supply | Qty: 30 | Fill #0

## 2017-09-20 MED FILL — POTASSIUM CL ER 10 MEQ CAP: 10 | 30 days supply | Qty: 30 | Fill #0

## 2017-09-20 MED FILL — !VENTOLIN HFA INHALER: 108 (90 BAS | 25 days supply | Qty: 18 | Fill #0

## 2017-09-20 NOTE — Patient Instructions (Addendum)
Coping with Quitting Smoking Quitting smoking is a physical and mental challenge. You will face cravings, withdrawal symptoms, and temptation. Before quitting, work with your health care provider to make a plan that can help you cope. Preparation can help you quit and keep you from giving in. How can I cope with cravings? Cravings usually last for 5-10 minutes. If you get through it, the craving will pass. Consider taking the following actions to help you cope with cravings:  Keep your mouth busy: ? Chew sugar-free gum. ? Suck on hard candies or a straw. ? Brush your teeth.  Keep your hands and body busy: ? Immediately change to a different activity when you feel a craving. ? Squeeze or play with a ball. ? Do an activity or a hobby, like making bead jewelry, practicing needlepoint, or working with wood. ? Mix up your normal routine. ? Take a short exercise break. Go for a quick walk or run up and down stairs. ? Spend time in public places where smoking is not allowed.  Focus on doing something kind or helpful for someone else.  Call a friend or family member to talk during a craving.  Join a support group.  Call a quit line, such as 1-800-QUIT-NOW.  Talk with your health care provider about medicines that might help you cope with cravings and make quitting easier for you.  How can I deal with withdrawal symptoms? Your body may experience negative effects as it tries to get used to not having nicotine in the system. These effects are called withdrawal symptoms. They may include:  Feeling hungrier than normal.  Trouble concentrating.  Irritability.  Trouble sleeping.  Feeling depressed.  Restlessness and agitation.  Craving a cigarette.  To manage withdrawal symptoms:  Avoid places, people, and activities that trigger your cravings.  Remember why you want to quit.  Get plenty of sleep.  Avoid coffee and other caffeinated drinks. These may worsen some of your  symptoms.  How can I handle social situations? Social situations can be difficult when you are quitting smoking, especially in the first few weeks. To manage this, you can:  Avoid parties, bars, and other social situations where people might be smoking.  Avoid alcohol.  Leave right away if you have the urge to smoke.  Explain to your family and friends that you are quitting smoking. Ask for understanding and support.  Plan activities with friends or family where smoking is not an option.  What are some ways I can cope with stress? Wanting to smoke may cause stress, and stress can make you want to smoke. Find ways to manage your stress. Relaxation techniques can help. For example:  Breathe slowly and deeply, in through your nose and out through your mouth.  Listen to soothing, relaxing music.  Talk with a family member or friend about your stress.  Light a candle.  Soak in a bath or take a shower.  Think about a peaceful place.  What are some ways I can prevent weight gain? Be aware that many people gain weight after they quit smoking. However, not everyone does. To keep from gaining weight, have a plan in place before you quit and stick to the plan after you quit. Your plan should include:  Having healthy snacks. When you have a craving, it may help to: ? Eat plain popcorn, crunchy carrots, celery, or other cut vegetables. ? Chew sugar-free gum.  Changing how you eat: ? Eat small portion sizes at meals. ?   Eat 4-6 small meals throughout the day instead of 1-2 large meals a day. ? Be mindful when you eat. Do not watch television or do other things that might distract you as you eat.  Exercising regularly: ? Make time to exercise each day. If you do not have time for a long workout, do short bouts of exercise for 5-10 minutes several times a day. ? Do some form of strengthening exercise, like weight lifting, and some form of aerobic exercise, like running or  swimming.  Drinking plenty of water or other low-calorie or no-calorie drinks. Drink 6-8 glasses of water daily, or as much as instructed by your health care provider.  Summary  Quitting smoking is a physical and mental challenge. You will face cravings, withdrawal symptoms, and temptation to smoke again. Preparation can help you as you go through these challenges.  You can cope with cravings by keeping your mouth busy (such as by chewing gum), keeping your body and hands busy, and making calls to family, friends, or a helpline for people who want to quit smoking.  You can cope with withdrawal symptoms by avoiding places where people smoke, avoiding drinks with caffeine, and getting plenty of rest.  Ask your health care provider about the different ways to prevent weight gain, avoid stress, and handle social situations. This information is not intended to replace advice given to you by your health care provider. Make sure you discuss any questions you have with your health care provider. Document Released: 10/26/2016 Document Revised: 10/26/2016 Document Reviewed: 10/26/2016 Elsevier Interactive Patient Education  2018 [place] Eating Plan DASH stands for "Dietary Approaches to Stop Hypertension." The DASH eating plan is a healthy eating plan that has been shown to reduce high blood pressure (hypertension). It may also reduce your risk for type 2 diabetes, heart disease, and stroke. The DASH eating plan may also help with weight loss. What are tips for following this plan? General guidelines  Avoid eating more than 2,300 mg (milligrams) of salt (sodium) a day. If you have hypertension, you may need to reduce your sodium intake to 1,500 mg a day.  Limit alcohol intake to no more than 1 drink a day for nonpregnant women and 2 drinks a day for men. One drink equals 12 oz of beer, 5 oz of wine, or 1 oz of hard liquor.  Work with your health care provider to maintain a healthy body  weight or to lose weight. Ask what an ideal weight is for you.  Get at least 30 minutes of exercise that causes your heart to beat faster (aerobic exercise) most days of the week. Activities may include walking, swimming, or biking.  Work with your health care provider or diet and nutrition specialist (dietitian) to adjust your eating plan to your individual calorie needs. Reading food labels  Check food labels for the amount of sodium per serving. Choose foods with less than 5 percent of the Daily Value of sodium. Generally, foods with less than 300 mg of sodium per serving fit into this eating plan.  To find whole grains, look for the word "whole" as the first word in the ingredient list. Shopping  Buy products labeled as "low-sodium" or "no salt added."  Buy fresh foods. Avoid canned foods and premade or frozen meals. Cooking  Avoid adding salt when cooking. Use salt-free seasonings or herbs instead of table salt or sea salt. Check with your health care provider or pharmacist before using salt substitutes.  Do not fry foods. Cook foods using healthy methods such as baking, boiling, grilling, and broiling instead.  Cook with heart-healthy oils, such as olive, canola, soybean, or sunflower oil. Meal planning   Eat a balanced diet that includes: ? 5 or more servings of fruits and vegetables each day. At each meal, try to fill half of your plate with fruits and vegetables. ? Up to 6-8 servings of whole grains each day. ? Less than 6 oz of lean meat, poultry, or fish each day. A 3-oz serving of meat is about the same size as a deck of cards. One egg equals 1 oz. ? 2 servings of low-fat dairy each day. ? A serving of nuts, seeds, or beans 5 times each week. ? Heart-healthy fats. Healthy fats called Omega-3 fatty acids are found in foods such as flaxseeds and coldwater fish, like sardines, salmon, and mackerel.  Limit how much you eat of the following: ? Canned or prepackaged  foods. ? Food that is high in trans fat, such as fried foods. ? Food that is high in saturated fat, such as fatty meat. ? Sweets, desserts, sugary drinks, and other foods with added sugar. ? Full-fat dairy products.  Do not salt foods before eating.  Try to eat at least 2 vegetarian meals each week.  Eat more home-cooked food and less restaurant, buffet, and fast food.  When eating at a restaurant, ask that your food be prepared with less salt or no salt, if possible. What foods are recommended? The items listed may not be a complete list. Talk with your dietitian about what dietary choices are best for you. Grains Whole-grain or whole-wheat bread. Whole-grain or whole-wheat pasta. Brown rice. [place]. Bulgur. Whole-grain and low-sodium cereals. Pita bread. Low-fat, low-sodium crackers. Whole-wheat flour tortillas. Vegetables Fresh or frozen vegetables (raw, steamed, roasted, or grilled). Low-sodium or reduced-sodium tomato and vegetable juice. Low-sodium or reduced-sodium tomato sauce and tomato paste. Low-sodium or reduced-sodium canned vegetables. Fruits All fresh, dried, or frozen fruit. Canned fruit in natural juice (without added sugar). Meat and other protein foods Skinless chicken or [place]. Ground chicken or [place]. Pork with fat trimmed off. Fish and seafood. Egg whites. Dried beans, peas, or lentils. Unsalted nuts, nut butters, and seeds. Unsalted canned beans. Lean cuts of beef with fat trimmed off. Low-sodium, lean deli meat. Dairy Low-fat (1%) or fat-free (skim) milk. Fat-free, low-fat, or reduced-fat cheeses. Nonfat, low-sodium ricotta or cottage cheese. Low-fat or nonfat yogurt. Low-fat, low-sodium cheese. Fats and oils Soft margarine without trans fats. Vegetable oil. Low-fat, reduced-fat, or light mayonnaise and salad dressings (reduced-sodium). Canola, safflower, olive, soybean, and sunflower oils. Avocado. Seasoning and other foods Herbs. Spices. Seasoning  mixes without salt. Unsalted popcorn and pretzels. Fat-free sweets. What foods are not recommended? The items listed may not be a complete list. Talk with your dietitian about what dietary choices are best for you. Grains Baked goods made with fat, such as croissants, muffins, or some breads. Dry pasta or rice meal packs. Vegetables Creamed or fried vegetables. Vegetables in a cheese sauce. Regular canned vegetables (not low-sodium or reduced-sodium). Regular canned tomato sauce and paste (not low-sodium or reduced-sodium). Regular tomato and vegetable juice (not low-sodium or reduced-sodium). Angie Fava. Olives. Fruits Canned fruit in a light or heavy syrup. Fried fruit. Fruit in cream or butter sauce. Meat and other protein foods Fatty cuts of meat. Ribs. Fried meat. Berniece Salines. Sausage. Bologna and other processed lunch meats. Salami. Fatback. Hotdogs. Bratwurst. Salted nuts and seeds. Canned beans  with added salt. Canned or smoked fish. Whole eggs or egg yolks. Chicken or [place] with skin. Dairy Whole or 2% milk, cream, and half-and-half. Whole or full-fat cream cheese. Whole-fat or sweetened yogurt. Full-fat cheese. Nondairy creamers. Whipped toppings. Processed cheese and cheese spreads. Fats and oils Butter. Stick margarine. Lard. Shortening. Ghee. Bacon fat. Tropical oils, such as coconut, palm kernel, or palm oil. Seasoning and other foods Salted popcorn and pretzels. Onion salt, garlic salt, seasoned salt, table salt, and sea salt. Worcestershire sauce. Tartar sauce. Barbecue sauce. Teriyaki sauce. Soy sauce, including reduced-sodium. Steak sauce. Canned and packaged gravies. Fish sauce. Oyster sauce. Cocktail sauce. Horseradish that you find on the shelf. Ketchup. Mustard. Meat flavorings and tenderizers. Bouillon cubes. Hot sauce and Tabasco sauce. Premade or packaged marinades. Premade or packaged taco seasonings. Relishes. Regular salad dressings. Where to find more information:  National  Heart, Lung, and [place]: https://wilson-eaton.com/  American Heart Association: www.heart.org Summary  The DASH eating plan is a healthy eating plan that has been shown to reduce high blood pressure (hypertension). It may also reduce your risk for type 2 diabetes, heart disease, and stroke.  With the DASH eating plan, you should limit salt (sodium) intake to 2,300 mg a day. If you have hypertension, you may need to reduce your sodium intake to 1,500 mg a day.  When on the DASH eating plan, aim to eat more fresh fruits and vegetables, whole grains, lean proteins, low-fat dairy, and heart-healthy fats.  Work with your health care provider or diet and nutrition specialist (dietitian) to adjust your eating plan to your individual calorie needs. This information is not intended to replace advice given to you by your health care provider. Make sure you discuss any questions you have with your health care provider. Document Released: 10/18/2011 Document Revised: 10/22/2016 Document Reviewed: 10/22/2016 Elsevier Interactive Patient Education  2017 Else[employer] Inc.  Hypertension Hypertension is another name for high blood pressure. High blood pressure forces your heart to work harder to pump blood. This can cause problems over time. There are two numbers in a blood pressure reading. There is a top number (systolic) over a bottom number (diastolic). It is best to have a blood pressure below 120/80. Healthy choices can help lower your blood pressure. You may need medicine to help lower your blood pressure if:  Your blood pressure cannot be lowered with healthy choices.  Your blood pressure is higher than 130/80.  Follow these instructions at home: Eating and drinking  If directed, follow the DASH eating plan. This diet includes: ? Filling half of your plate at each meal with fruits and vegetables. ? Filling one quarter of your plate at each meal with whole grains. Whole grains include whole wheat  pasta, brown rice, and whole grain bread. ? Eating or drinking low-fat dairy products, such as skim milk or low-fat yogurt. ? Filling one quarter of your plate at each meal with low-fat (lean) proteins. Low-fat proteins include fish, skinless chicken, eggs, beans, and tofu. ? Avoiding fatty meat, cured and processed meat, or chicken with skin. ? Avoiding premade or processed food.  Eat less than 1,500 mg of salt (sodium) a day.  Limit alcohol use to no more than 1 drink a day for nonpregnant women and 2 drinks a day for men. One drink equals 12 oz of beer, 5 oz of wine, or 1 oz of hard liquor. Lifestyle  Work with your doctor to stay at a healthy weight or to lose weight. Ask  your doctor what the best weight is for you.  Get at least 30 minutes of exercise that causes your heart to beat faster (aerobic exercise) most days of the week. This may include walking, swimming, or biking.  Get at least 30 minutes of exercise that strengthens your muscles (resistance exercise) at least 3 days a week. This may include lifting weights or pilates.  Do not use any products that contain nicotine or tobacco. This includes cigarettes and e-cigarettes. If you need help quitting, ask your doctor.  Check your blood pressure at home as told by your doctor.  Keep all follow-up visits as told by your doctor. This is important. Medicines  Take over-the-counter and prescription medicines only as told by your doctor. Follow directions carefully.  Do not skip doses of blood pressure medicine. The medicine does not work as well if you skip doses. Skipping doses also puts you at risk for problems.  Ask your doctor about side effects or reactions to medicines that you should watch for. Contact a doctor if:  You think you are having a reaction to the medicine you are taking.  You have headaches that keep coming back (recurring).  You feel dizzy.  You have swelling in your ankles.  You have trouble with  your vision. Get help right away if:  You get a very bad headache.  You start to feel confused.  You feel weak or numb.  You feel faint.  You get very bad pain in your: ? Chest. ? Belly (abdomen).  You throw up (vomit) more than once.  You have trouble breathing. Summary  Hypertension is another name for high blood pressure.  Making healthy choices can help lower blood pressure. If your blood pressure cannot be controlled with healthy choices, you may need to take medicine. This information is not intended to replace advice given to you by your health care provider. Make sure you discuss any questions you have with your health care provider. Document Released: 04/16/2008 Document Revised: 09/26/2016 Document Reviewed: 09/26/2016 Elsevier Interactive Patient Education  2018 [employer]. Lipid Profile Test Why am I having this test? The lipid profile test gives results that can help predict the likelihood of developing heart disease. The test is also used to monitor treatment for high cholesterol to see if you are reaching your goals. A lipid profile measures the following:  Total cholesterol. Cholesterol is a waxy fat in your blood. If your total cholesterol is elevated, this can increase your risk of coronary heart disease.  High-density lipoprotein (HDL). This is known as the good cholesterol. Having a high level of HDL is good. Your HDL level may be low if you smoke or do not get enough exercise.  Low-density lipoprotein (LDL). This is known as the bad cholesterol and is responsible for the formation of plaque in the arteries. Having a low level of LDL is best.  Cholesterol to HDL ratio. This is calculated by dividing the total cholesterol by the HDL cholesterol. The ratio is used by health care providers for determining your risk of heart disease. A low ratio is best.  Triglycerides. These are a type of fat in the blood responsible for providing energy to your cells. Low  levels are best.  What kind of sample is taken? A blood sample is required for this test. It is usually collected by inserting a needle into a vein. How do I prepare for this test? Do not eat or drink anything after midnight on the night  before the test or as directed by your health care provider. What are the reference ranges? Reference ranges are considered healthy ranges established after testing a large group of healthy people. Reference ranges may vary among different people, labs, and hospitals. It is your responsibility to obtain your test results. Ask the lab or department performing the test when and how you will get your results. Reference ranges for the lipid profile test are as follows: Total Cholesterol  Adult or elderly: less than 200 mg/dL or less than 5.20 mmol/L (SI units).  Child: 120-200 mg/dL.  Infant: 70-175 mg/dL.  Newborns: 53-135 mg/dL. HDL  Male: greater than 45 mg/dL or greater than 0.75 mmol/L (SI units).  Male: greater than 55 mg/dL or greater than 0.91 mmol/L (SI units). HDL reference values based on risk of heart disease:  For low risk of heart disease: ? Male: 60 mg/dL or 1.55 mmol/L. ? Male: 70 mg/dL or 1.81 mmol/L.  For moderate risk of heart disease: ? Male: 45 mg/dL or 1.17 mmol/L. ? Male: 55 mg/dL or 1.42 mmol/L.  For high risk of heart disease: ? Male: 25 mg/dL or 0.65 mmol/L. ? Male: 35 mg/dL or 0.90 mmol/L.  LDL  Adult: less than 130 mg/dL.  Children: less than 110 mg/dL. Cholesterol to HDL Ratio Reference values based on risk for coronary heart disease:  Risk that is one half average: ? Male: 3.4. ? Male: 3.3.  Average risk: ? Male: 5.0. ? Male: 4.4.  Risk that is two times average (moderate risk): ? Male: 10.0. ? Male: 7.0.  Risk that is three times average (high risk): ? Male: 24.0. ? Male: 11.0.  Triglycerides  Adult or elderly: ? Male: 40-160 mg/dL or 0.45-1.81 mmol/L (SI units). ? Male:  35-135 mg/dL or 0.40-1.52 mmol/L (SI units).  Children 2-71 years old: ? Male: 30-86 mg/dL. ? Male: 32-99 mg/dL.  Children 82-72 years old: ? Male: 31-108 mg/dL. ? Male: 35-114 mg/dL.  Children 25-15 years old: ? Male: 36-138 mg/dL. ? Male: 41-138 mg/dL.  Children 88-42 years old: ? Male: 40-163 mg/dL. ? Male: 40-128 mg/dL. Triglycerides should be less than 400 mg/dL even when you are not fasting. What do the results mean? Talk with your health care provider to discuss your results, treatment options, and if necessary, the need for more tests. Talk with your health care provider if you have any questions about your results. Talk with your health care provider to discuss your results, treatment options, and if necessary, the need for more tests. Talk with your health care provider if you have any questions about your results. This information is not intended to replace advice given to you by your health care provider. Make sure you discuss any questions you have with your health care provider. Document Released: 11/22/2004 Document Revised: 07/04/2016 Document Reviewed: 02/18/2014 Elsevier Interactive Patient Education  2018 Levi Alvarez.  Chronic Obstructive Pulmonary Disease Chronic obstructive pulmonary disease (COPD) is a long-term (chronic) lung problem. When you have COPD, it is hard for air to get in and out of your lungs. The way your lungs work will never return to normal. Usually the condition gets worse over time. There are things you can do to keep yourself as healthy as possible. Your doctor may treat your condition with:  Medicines.  Quitting smoking, if you smoke.  Rehabilitation. This may involve a team of specialists.  Oxygen.  Exercise and changes to your diet.  Lung surgery.  Comfort measures (palliative care).  Follow these instructions  at home: Medicines  Take over-the-counter and prescription medicines only as told by your doctor.  Talk to  your doctor before taking any cough or allergy medicines. You may need to avoid medicines that cause your lungs to be dry. Lifestyle  If you smoke, stop. Smoking makes the problem worse. If you need help quitting, ask your doctor.  Avoid being around things that make your breathing worse. This may include smoke, chemicals, and fumes.  Stay active, but remember to also rest.  Learn and use tips on how to relax.  Make sure you get enough sleep. Most adults need at least 7 hours a night.  Eat healthy foods. Eat smaller meals more often. Rest before meals. Controlled breathing  Learn and use tips on how to control your breathing as told by your doctor. Try: ? Breathing in (inhaling) through your nose for 1 second. Then, pucker your lips and breath out (exhale) through your lips for 2 seconds. ? Putting one hand on your belly (abdomen). Breathe in slowly through your nose for 1 second. Your hand on your belly should move out. Pucker your lips and breathe out slowly through your lips. Your hand on your belly should move in as you breathe out. Controlled coughing  Learn and use controlled coughing to clear mucus from your lungs. The steps are: 1. Lean your head a little forward. 2. Breathe in deeply. 3. Try to hold your breath for 3 seconds. 4. Keep your mouth slightly open while coughing 2 times. 5. Spit any mucus out into a tissue. 6. Rest and do the steps again 1 or 2 times as needed. General instructions  Make sure you get all the shots (vaccines) that your doctor recommends. Ask your doctor about a flu shot and a pneumonia shot.  Use oxygen therapy and therapy to help improve your lungs (pulmonary rehabilitation) if told by your doctor. If you need home oxygen therapy, ask your doctor if you should buy a tool to measure your oxygen level (oximeter).  Make a COPD action plan with your doctor. This helps you know what to do if you feel worse than usual.  Manage any other conditions  you have as told by your doctor.  Avoid going outside when it is very hot, cold, or humid.  Avoid people who have a sickness you can catch (contagious).  Keep all follow-up visits as told by your doctor. This is important. Contact a doctor if:  You cough up more mucus than usual.  There is a change in the color or thickness of the mucus.  It is harder to breathe than usual.  Your breathing is faster than usual.  You have trouble sleeping.  You need to use your medicines more often than usual.  You have trouble doing your normal activities such as getting dressed or walking around the house. Get help right away if:  You have shortness of breath while resting.  You have shortness of breath that stops you from: ? Being able to talk. ? Doing normal activities.  Your chest hurts for longer than 5 minutes.  Your skin color is more blue than usual.  Your pulse oximeter shows that you have low oxygen for longer than 5 minutes.  You have a fever.  You feel too tired to breathe normally. Summary  Chronic obstructive pulmonary disease (COPD) is a long-term lung problem.  The way your lungs work will never return to normal. Usually the condition gets worse over time. There are things  you can do to keep yourself as healthy as possible.  Take over-the-counter and prescription medicines only as told by your doctor.  If you smoke, stop. Smoking makes the problem worse. This information is not intended to replace advice given to you by your health care provider. Make sure you discuss any questions you have with your health care provider. Document Released: 04/16/2008 Document Revised: 04/05/2016 Document Reviewed: 06/25/2013 Elsevier Interactive Patient Education  2017 [employer].

## 2017-09-20 NOTE — Progress Notes (Signed)
Assessment & Plan:  Levi Alvarez was seen today for Levi patient (initial visit).  Diagnoses and all orders for this visit:  Essential hypertension -     chlorthalidone (HYGROTON) 25 MG tablet; Take 1 tablet (25 mg total) 1 day or 1 dose by mouth. -     potassium chloride (KLOR-CON 10) 10 MEQ tablet; Take 1 tablet (10 mEq total) daily by mouth. DASH DIET Remember to bring blood pressure log for each follow up visit Exercise at least 150 minutes per week.   Cigarette nicotine dependence with other nicotine-induced disorder Levi Alvarez was counseled on the dangers of tobacco use, and was advised to quit. Reviewed strategies to maximize success, including removing cigarettes and smoking materials from environment, stress management and support of family/friends as well as pharmacological alternatives including: Wellbutrin, Chantix, Nicotine patch, Nicotine gum or lozenges. Smoking cessation support: smoking cessation hotline: 1-800-QUIT-NOW.  Smoking cessation classes are also available through Levi Alvarez. Call ([phone] or visit our website at [email or web address]   Spent 4 minutes counseling on smoking cessation and patient is not ready to quit.   Chronic obstructive pulmonary disease, unspecified COPD type (Levi Alvarez) -     ipratropium-albuterol (DUONEB) 0.5-2.5 (3) MG/3ML nebulizer solution 3 ml -     fluticasone furoate-vilanterol (BREO ELLIPTA) 200-25 MCG/INH AEPB; Inhale 1 puff daily into the lungs.     Subjective:   Chief Complaint  Patient presents with  . Levi Patient (Initial Visit)    Patient stated that he have back and knee pain.    HPI Levi Alvarez 46 y.o. male presents to office today to establish care as a Levi patient. He was seeing PCP Dr. Glade Lloyd regularly per his report until he lost his insurance. He is currently applying for disability due to a work related accident he had several years ago.    Essential Hypertension Reportedly was unable to  tolerate HCTZ due to increased swelling in legs and ineffectiveness with lowering BP. He was placed on chlorthalidone which he states provided significant reduction in BP. He is normotensive today. Denies chest pain, lightheadedness, dizziness, palpitations or BLE.    Hepatitis C Recently diagnosed. He has a history of blood transfusion in the past as well as multiple tattoos. Has appointment with Infectious disease on 10-08-2017. Currently denies any GI symptoms.    Insomnia Has trouble staying asleep. Reportedly he has taken melatonin and valerian root with no relief of symptoms. Sleeps a few hours at a time. He does nap during the day as well.   OSA/CPAP Reports he was recently diagnosed with sleep apnea but was unable to afford the CPAP machine. Will attempt to align him with services that may assist his with obtaining a CPAP based on his current finances.   Tobacco Dependence Reports he is "down to 1 pack per day from 1.5 ppd".  Down from 1.5packs per day. Started smoking at the age of 21.   COPD He reportedly has an albuterol inhaler that he uses multiple times a day. Likely has COPD. Will order BREO and place referral for spirometry. He is wheezing in the office today as well. Duonebs administered.    Past Medical History:  Diagnosis Date  . Hypertension     Past Surgical History:  Procedure Laterality Date  . arm surgery    . KNEE SURGERY      Family History  Problem Relation Age of Onset  . Colitis Mother   . Arthritis Mother   . Cancer  Father        lymphoma?  . Cancer Paternal Grandfather        bone  . Heart disease Neg Hx   . Stroke Neg Hx   . Diabetes Neg Hx     Social History   Socioeconomic History  . Marital status: Single    Spouse name: Not on file  . Number of children: Not on file  . Years of education: Not on file  . Highest education level: Not on file  Social Needs  . Financial resource strain: Not on file  . Food insecurity - worry: Not  on file  . Food insecurity - inability: Not on file  . Transportation needs - medical: Not on file  . Transportation needs - non-medical: Not on file  Occupational History  . Not on file  Tobacco Use  . Smoking status: Current Every Day Smoker    Packs/day: 1.00    Types: Cigarettes  . Smokeless tobacco: Former Systems developer    Types: Chew  Substance and Sexual Activity  . Alcohol use: Yes    Alcohol/week: 10.8 oz    Types: 18 Cans of beer per week    Comment: daily  . Drug use: No  . Sexual activity: Not on file  Other Topics Concern  . Not on file  Social History Narrative  . Not on file    Outpatient Medications Prior to Visit  Medication Sig Dispense Refill  . carvedilol (COREG) 3.125 MG tablet Take 1 tablet (3.125 mg total) by mouth 2 (two) times daily. 180 tablet 1  . traMADol (ULTRAM) 50 MG tablet Take 1 tablet (50 mg total) by mouth every 8 (eight) hours as needed. for pain 20 tablet 0  . carvedilol (COREG) 3.125 MG tablet TAKE 1 TABLET(3.125 MG) BY MOUTH TWICE DAILY 60 tablet 0  . chlorthalidone (HYGROTON) 25 MG tablet Take 25 mg 1 day or 1 dose by mouth.    . potassium chloride (KLOR-CON 10) 10 MEQ tablet Take 1 tablet (10 mEq total) by mouth daily. 30 tablet 2  . albuterol (PROAIR HFA) 108 (90 Base) MCG/ACT inhaler Inhale 1 puff into the lungs every 6 (six) hours as needed for wheezing or shortness of breath. (Patient not taking: Reported on 07/12/2017) 18 g 0  . albuterol (PROVENTIL HFA;VENTOLIN HFA) 108 (90 Base) MCG/ACT inhaler Inhale 1 puff into the lungs every 6 (six) hours as needed for wheezing or shortness of breath. (Patient not taking: Reported on 07/12/2017) 1 Inhaler 1  . hydrochlorothiazide (HYDRODIURIL) 25 MG tablet Take 1 tablet (25 mg total) by mouth daily. (Patient not taking: Reported on 09/20/2017) 30 tablet 2   No facility-administered medications prior to visit.     Allergies  Allergen Reactions  . Naproxen Hives and Swelling    Review of Systems    Constitutional: Negative for fever, malaise/fatigue and weight loss.  HENT: Negative.  Negative for nosebleeds.   Eyes: Negative.  Negative for blurred vision, double vision and photophobia.  Respiratory: Positive for cough, sputum production, shortness of breath and wheezing. Negative for hemoptysis.   Cardiovascular: Negative.  Negative for chest pain, palpitations and leg swelling.  Gastrointestinal: Negative.  Negative for abdominal pain, constipation, diarrhea, heartburn, nausea and vomiting.  Musculoskeletal: Negative.  Negative for myalgias.  Neurological: Negative.  Negative for dizziness, focal weakness, seizures and headaches.  Endo/Heme/Allergies: Negative for environmental allergies.  Psychiatric/Behavioral: Negative.  Negative for suicidal ideas.       Objective:    Physical  Exam  Constitutional: He is oriented to person, place, and time. He appears well-developed and well-nourished. He is cooperative.  HENT:  Head: Normocephalic and atraumatic.  Eyes: EOM are normal.  Neck: Normal range of motion.  Cardiovascular: Normal rate, regular rhythm, normal heart sounds and intact distal pulses. Exam reveals no gallop and no friction rub.  No murmur heard. Pulmonary/Chest: Effort normal. No accessory muscle usage. No tachypnea. No respiratory distress. He has no decreased breath sounds. He has wheezes in the right upper field, the right middle field, the right lower field, the left upper field, the left middle field and the left lower field. He has no rhonchi. He has no rales. He exhibits no tenderness.  Wheezing improved significantly after duoneb treatment.   Abdominal: Soft. Bowel sounds are normal.  Musculoskeletal: Normal range of motion. He exhibits no edema.  Neurological: He is alert and oriented to person, place, and time. Coordination normal.  Skin: Skin is warm and dry.  Psychiatric: He has a normal mood and affect. His behavior is normal. Judgment and thought content  normal.  Nursing note and vitals reviewed.   BP 117/79 (BP Location: Right Arm, Patient Position: Sitting, Cuff Size: Normal)   Pulse 73   Temp 97.7 F (36.5 C) (Oral)   Resp 18   Ht 5\' 11"  (1.803 m)   Wt 247 lb 6.4 oz (112.2 kg)   SpO2 96%   BMI 34.51 kg/m  Wt Readings from Last 3 Encounters:  09/20/17 247 lb 6.4 oz (112.2 kg)  07/12/17 239 lb (108.4 kg)  06/05/17 234 lb 6.4 oz (106.3 kg)        Patient has been counseled extensively about nutrition and exercise as well as the importance of adherence with medications and regular follow-up. The patient was given clear instructions to go to ER or return to medical Alvarez if symptoms don't improve, worsen or Levi problems develop. The patient verbalized understanding. Patient is aware I will not be prescribing tramadol.   Patient has been counseled on age-appropriate routine health concerns for screening and prevention. These are reviewed and up-to-date. Referrals have been placed accordingly. Immunizations are up-to-date or declined.     Follow-up: Return in about 2 months (around 11/20/2017) for follow up.Gildardo Pounds, FNP-BC [facility] and [facility], [place]   09/22/2017, 11:31 PM

## 2017-09-25 ENCOUNTER — Telehealth: Payer: Self-pay | Admitting: Nurse Practitioner

## 2017-09-25 NOTE — Telephone Encounter (Signed)
Pt's girlfriend, [name], asked to leave a message with you since she has been working with you on getting info to other providers and meds needed. [name] said [facility] need Cpap setting numbers. Fax to [phone]

## 2017-10-02 ENCOUNTER — Ambulatory Visit: Payer: Self-pay | Attending: Nurse Practitioner

## 2017-10-08 ENCOUNTER — Encounter: Payer: Self-pay | Admitting: Internal Medicine

## 2017-10-08 ENCOUNTER — Ambulatory Visit (INDEPENDENT_AMBULATORY_CARE_PROVIDER_SITE_OTHER): Payer: Self-pay | Admitting: Internal Medicine

## 2017-10-08 VITALS — BP 157/96 | HR 73 | Temp 98.0°F | Wt 247.0 lb

## 2017-10-08 DIAGNOSIS — Z23 Encounter for immunization: Secondary | ICD-10-CM

## 2017-10-08 DIAGNOSIS — K7030 Alcoholic cirrhosis of liver without ascites: Secondary | ICD-10-CM | POA: Insufficient documentation

## 2017-10-08 DIAGNOSIS — B182 Chronic viral hepatitis C: Secondary | ICD-10-CM

## 2017-10-08 DIAGNOSIS — K7460 Unspecified cirrhosis of liver: Secondary | ICD-10-CM

## 2017-10-08 MED ORDER — SOFOSBUVIR-VELPATASVIR 400-100 MG PO TABS: 1.0000 | ORAL_TABLET | Freq: Every day | ORAL | 2 refills | Status: DC

## 2017-10-08 NOTE — Patient Instructions (Signed)
Date 10/08/17  Dear Mr. Moya, As discussed in the [facility], your hepatitis C therapy will include highly effective medication(s) for treatment and will vary based on the type of hepatitis C and insurance approval.  Potential medications include:          Harvoni (sofosbuvir 90mg /ledipasvir 400mg ) tablet oral daily          OR     Epclusa (sofosbuvir 400mg /velpatasvir 100mg ) tablet oral daily          OR      Mavyret (glecaprevir 100 mg/pibrentasvir 40 mg): Take 3 tablets oral daily          OR     Zepatier (elbasvir 50 mg/grazoprevir 100 mg) oral daily, +/- ribavirin              Medications are typically for 8 or 12 weeks total ---------------------------------------------------------------- Your HCV Treatment Start Date: You will be notified by our office once the medication is approved and where you can pick it up (or if mailed)   ---------------------------------------------------------------- [place]:   [facility] [place], [place] [address] Phone: [phone] Hours: Monday to Friday 7:30 am to 6:00 pm   Please always contact your pharmacy at least 3-4 business days before you run out of medications to ensure your next month's medication is ready or 1 week prior to running out if you receive it by mail.  Remember, each prescription is for 28 days. ---------------------------------------------------------------- GENERAL NOTES REGARDING YOUR HEPATITIS C MEDICATION:  Some medications have the following interactions:  - Acid reducing agents such as H2 blockers (ie. Pepcid (famotidine), Zantac (ranitidine), Tagamet (cimetidine), Axid (nizatidine) and proton pump inhibitors (ie. Prilosec (omeprazole), Protonix (pantoprazole), Nexium (esomeprazole), or Aciphex (rabeprazole)). Do not take until you have discussed with a health care provider.    -Antacids that contain magnesium and/or aluminum hydroxide (ie. Milk of Magensia, Rolaids,  Gaviscon, Maalox, Mylanta, an dArthritis Pain Formula).  -Calcium carbonate (calcium supplements or antacids such as Tums, Caltrate, Os-Cal).  -St. John's wort or any products that contain St. John's wort like some herbal supplements  Please inform the office prior to starting any of these medications.  - The common side effects associated with Harvoni include:      1. Fatigue      2. Headache      3. Nausea      4. Diarrhea      5. Insomnia  Please note that this only lists the most common side effects and is NOT a comprehensive list of the potential side effects of these medications. For more information, please review the drug information sheets that come with your medication package from the pharmacy.  ---------------------------------------------------------------- GENERAL HELPFUL HINTS ON HCV THERAPY: 1. Stay well-hydrated. 2. Notify the [facility] of any changes in your other over-the-counter/herbal or prescription medications. 3. If you miss a dose of your medication, take the missed dose as soon as you remember. Return to your regular time/dose schedule the next day.  4.  Do not stop taking your medications without first talking with your healthcare provider. 5.  You may take Tylenol (acetaminophen), as long as the dose is less than 2000 mg (OR no more than 4 tablets of the Tylenol Extra Strengths 500mg  tablet) in 24 hours. 6.  You will see our pharmacist-specialist within the first 2 weeks of starting your medication to monitor for any possible side effects. 7.  You will have labs once during treatment, soon  after treatment completion and one final lab 6 months after treatment completion to verify the virus is out of your system.  Scharlene Gloss, [place] for [place], [place]  [phone]

## 2017-10-08 NOTE — Progress Notes (Signed)
[facility] for Infectious [facility]   CC: consideration for treatment for chronic hepatitis C  HPI:  +Levi Alvarez is a 46 y.o. male who presents for initial evaluation and management of chronic hepatitis C.  Patient tested positive earlier this year during screening for transaminitis. Hepatitis C-associated risk factors present are: history of blood transfusion (details: prior to 1990), tattoos (details: mutliple and could have been shared needle). Patient denies intranasal drug use, IV drug abuse. Patient has had other studies performed. Results: hepatitis C RNA by PCR, result: positive. Patient has not had prior treatment for Hepatitis C. Patient does have a past history of liver disease. Patient does not have a family history of liver disease. Patient does not  have associated signs or symptoms related to liver disease.  Labs reviewed and confirm chronic hepatitis C with a positive viral load.    Reviewed ultrasound in Epic and signs of early cirrhosis.       Patient does not have documented immunity to Hepatitis A. Patient does not have documented immunity to Hepatitis B.    Review of Systems:   Constitutional: negative for fatigue and malaise Gastrointestinal: negative for diarrhea Integument/breast: negative for rash All other systems reviewed and are negative       Past Medical History:  Diagnosis Date  . Hypertension     Prior to Admission medications   Medication Sig Start Date End Date Taking? Authorizing Provider  albuterol (PROAIR HFA) 108 (90 Base) MCG/ACT inhaler Inhale 1 puff every 6 (six) hours as needed into the lungs for wheezing or shortness of breath. 09/20/17  Yes Gildardo Pounds, NP  carvedilol (COREG) 3.125 MG tablet Take 1 tablet (3.125 mg total) by mouth 2 (two) times daily. 08/30/17 08/30/18 Yes Tysinger, Camelia Eng, PA-C  chlorthalidone (HYGROTON) 25 MG tablet Take 1 tablet (25 mg total) 1 day or 1 dose by mouth. 09/20/17 12/19/17 Yes Gildardo Pounds, NP   fluticasone furoate-vilanterol (BREO ELLIPTA) 200-25 MCG/INH AEPB Inhale 1 puff daily into the lungs. 09/20/17 10/20/17 Yes Gildardo Pounds, NP  potassium chloride (KLOR-CON 10) 10 MEQ tablet Take 1 tablet (10 mEq total) daily by mouth. 09/20/17 12/19/17 Yes Gildardo Pounds, NP  traMADol (ULTRAM) 50 MG tablet Take 1 tablet (50 mg total) by mouth every 8 (eight) hours as needed. for pain 09/04/17  Yes Tysinger, Camelia Eng, PA-C  Sofosbuvir-Velpatasvir (EPCLUSA) 400-100 MG TABS Take 1 tablet by mouth daily. 10/08/17   Comer, Okey Regal, MD    Allergies  Allergen Reactions  . Naproxen Hives and Swelling    Social History   Tobacco Use  . Smoking status: Current Every Day Smoker    Packs/day: 1.00    Types: Cigarettes  . Smokeless tobacco: Former Systems developer    Types: Chew  Substance Use Topics  . Alcohol use: Yes    Alcohol/week: 10.8 oz    Types: 18 Cans of beer per week    Comment: daily  . Drug use: No  stopped drinking any alcohol now  Family History  Problem Relation Age of Onset  . Colitis Mother   . Arthritis Mother   . Cancer Father        lymphoma?  . Cancer Paternal Grandfather        bone  . Heart disease Neg Hx   . Stroke Neg Hx   . Diabetes Neg Hx      Objective:  Constitutional: in no apparent distress and alert,  Vitals:  10/08/17 1538  BP: (!) 157/96  Pulse: 73  Temp: 98 F (36.7 C)   Eyes: anicteric Cardiovascular: Cor RRR Respiratory: CTA B; normal respiratory effort Gastrointestinal: Bowel sounds are normal, liver is not enlarged, spleen is not enlarged; obese Musculoskeletal: peripheral pulses normal, no pedal edema, no clubbing or cyanosis Skin: negatives: no rash, multiple tattoos; no porphyria cutanea tarda Lymphatic: no cervical lymphadenopathy   Laboratory Genotype:  HCV viral load:  Lab Results  Component Value Date   HCVQUANT 6,460,000 (H) 07/12/2017   Lab Results  Component Value Date   WBC 6.5 06/05/2017   HGB 14.4 06/05/2017   HCT  42.3 06/05/2017   MCV 101.7 (H) 06/05/2017   PLT 143 06/05/2017    Lab Results  Component Value Date   CREATININE 0.64 06/05/2017   BUN 10 06/05/2017   NA 135 06/05/2017   K 4.2 06/05/2017   CL 102 06/05/2017   CO2 19 (L) 06/05/2017    Lab Results  Component Value Date   ALT 164 (H) 07/12/2017   AST 284 (H) 07/12/2017   ALKPHOS 96 07/12/2017     Labs and history reviewed and show CHILD-PUGH unknown  5-6 points: Child class A 7-9 points: Child class B 10-15 points: Child class C  Lab Results  Component Value Date   BILITOT 0.5 07/12/2017   ALBUMIN 4.0 07/12/2017     Assessment: New Patient with Chronic Hepatitis C genotype unknown, untreated.  I discussed with the patient the lab findings that confirm chronic hepatitis C as well as the natural history and progression of disease including about 30% of people who develop cirrhosis of the liver if left untreated and once cirrhosis is established there is a 2-7% risk per year of liver cancer and liver failure.  I discussed the importance of treatment and benefits in reducing the risk, even if significant liver fibrosis exists.   Plan: 1) Patient counseled extensively on limiting acetaminophen to no more than 2 grams daily, avoidance of alcohol. 2) Transmission discussed with patient including sexual transmission, sharing razors and toothbrush.   3) Will need referral to gastroenterology if concern for cirrhosis 4) Will need referral for substance abuse counseling: No.; Further work up to include urine drug screen  No. 5) Will prescribe Epclusa for 12 weeks 6) Hepatitis A and B titers 7) Pneumovax vaccine today 8) referral to GI for cirrhosis noted on ultrasound for ? EGD, platelets < 150 but albumin wnl.  I will not do an elastography since it will be F4 as will Fibrosure.

## 2017-10-09 ENCOUNTER — Encounter: Payer: Self-pay | Admitting: Physician Assistant

## 2017-10-09 LAB — PROTIME-INR
INR: 1.1
PROTHROMBIN TIME: 11.9 s — AB (ref 9.0–11.5)

## 2017-10-09 LAB — HEPATITIS B CORE ANTIBODY, TOTAL: HEP B C TOTAL AB: NONREACTIVE

## 2017-10-09 LAB — HEPATITIS A ANTIBODY, TOTAL: Hepatitis A AB,Total: NONREACTIVE

## 2017-10-09 LAB — HEPATITIS B SURFACE ANTIBODY,QUALITATIVE: HEP B S AB: NONREACTIVE

## 2017-10-09 LAB — HIV ANTIBODY (ROUTINE TESTING W REFLEX): HIV 1&2 Ab, 4th Generation: NONREACTIVE

## 2017-10-11 LAB — HEPATITIS C GENOTYPE

## 2017-10-14 MED FILL — POTASSIUM CL ER 10 MEQ CAP: 10 | 30 days supply | Qty: 30 | Fill #1

## 2017-10-14 MED FILL — ?CHLORTHALIDONE 25 MG TABLE: 25 | 30 days supply | Qty: 30 | Fill #1

## 2017-10-18 ENCOUNTER — Telehealth: Payer: Self-pay | Admitting: Nurse Practitioner

## 2017-10-18 NOTE — Telephone Encounter (Signed)
Pt friend Called [name]) she want you to please call her back at [phone] is about the Pt. Please follow up

## 2017-10-21 ENCOUNTER — Ambulatory Visit: Payer: Self-pay | Admitting: Physician Assistant

## 2017-10-23 ENCOUNTER — Encounter (HOSPITAL_COMMUNITY): Payer: Self-pay

## 2017-10-29 ENCOUNTER — Ambulatory Visit (HOSPITAL_COMMUNITY)
Admission: RE | Admit: 2017-10-29 | Discharge: 2017-10-29 | Disposition: A | Discharge status: Home / Self Care | Payer: Self-pay | Source: Ambulatory Visit | Attending: Nurse Practitioner | Admitting: Nurse Practitioner

## 2017-10-29 DIAGNOSIS — F17218 Nicotine dependence, cigarettes, with other nicotine-induced disorders: Secondary | ICD-10-CM | POA: Insufficient documentation

## 2017-10-29 LAB — SPIROMETRY WITH GRAPH
FEF 25-75 PRE: 3.28 L/s
FEF 25-75 Post: 2.97 L/sec
FEF2575-%CHANGE-POST: -9 %
FEF2575-%PRED-POST: 80 %
FEF2575-%Pred-Pre: 89 %
FEV1-%CHANGE-POST: -5 %
FEV1-%PRED-POST: 81 %
FEV1-%Pred-Pre: 85 %
FEV1-POST: 3.28 L
FEV1-PRE: 3.46 L
FEV1FVC-%Change-Post: -2 %
FEV1FVC-%Pred-Pre: 101 %
FEV6-%CHANGE-POST: -3 %
FEV6-%PRED-POST: 82 %
FEV6-%PRED-PRE: 86 %
FEV6-PRE: 4.31 L
FEV6-Post: 4.14 L
FEV6FVC-%CHANGE-POST: 0 %
FEV6FVC-%PRED-PRE: 103 %
FEV6FVC-%Pred-Post: 103 %
FVC-%CHANGE-POST: -2 %
FVC-%PRED-POST: 81 %
FVC-%Pred-Pre: 83 %
FVC-Post: 4.19 L
FVC-Pre: 4.31 L
POST FEV1/FVC RATIO: 78 %
PRE FEV6/FVC RATIO: 100 %
Post FEV6/FVC ratio: 100 %
Pre FEV1/FVC ratio: 80 %

## 2017-10-29 MED ORDER — ALBUTEROL SULFATE (2.5 MG/3ML) 0.083% IN NEBU
2.5000 mg | INHALATION_SOLUTION | Freq: Once | RESPIRATORY_TRACT | Status: AC
  Administered 2017-10-29: 2.5 mg via RESPIRATORY_TRACT

## 2017-11-01 ENCOUNTER — Encounter: Payer: Self-pay | Admitting: Nurse Practitioner

## 2017-11-01 ENCOUNTER — Ambulatory Visit: Payer: Self-pay | Attending: Nurse Practitioner | Admitting: Nurse Practitioner

## 2017-11-01 VITALS — BP 101/62 | HR 79 | Temp 98.5°F | Ht 70.0 in | Wt 250.6 lb

## 2017-11-01 DIAGNOSIS — F17200 Nicotine dependence, unspecified, uncomplicated: Secondary | ICD-10-CM

## 2017-11-01 DIAGNOSIS — Z9889 Other specified postprocedural states: Secondary | ICD-10-CM | POA: Insufficient documentation

## 2017-11-01 DIAGNOSIS — Z79891 Long term (current) use of opiate analgesic: Secondary | ICD-10-CM | POA: Insufficient documentation

## 2017-11-01 DIAGNOSIS — Z807 Family history of other malignant neoplasms of lymphoid, hematopoietic and related tissues: Secondary | ICD-10-CM | POA: Insufficient documentation

## 2017-11-01 DIAGNOSIS — Z808 Family history of malignant neoplasm of other organs or systems: Secondary | ICD-10-CM | POA: Insufficient documentation

## 2017-11-01 DIAGNOSIS — G4730 Sleep apnea, unspecified: Secondary | ICD-10-CM | POA: Insufficient documentation

## 2017-11-01 DIAGNOSIS — Z886 Allergy status to analgesic agent status: Secondary | ICD-10-CM | POA: Insufficient documentation

## 2017-11-01 DIAGNOSIS — K439 Ventral hernia without obstruction or gangrene: Secondary | ICD-10-CM | POA: Insufficient documentation

## 2017-11-01 DIAGNOSIS — I10 Essential (primary) hypertension: Secondary | ICD-10-CM | POA: Insufficient documentation

## 2017-11-01 DIAGNOSIS — K529 Noninfective gastroenteritis and colitis, unspecified: Secondary | ICD-10-CM | POA: Insufficient documentation

## 2017-11-01 DIAGNOSIS — Z79899 Other long term (current) drug therapy: Secondary | ICD-10-CM | POA: Insufficient documentation

## 2017-11-01 DIAGNOSIS — J449 Chronic obstructive pulmonary disease, unspecified: Secondary | ICD-10-CM | POA: Insufficient documentation

## 2017-11-01 DIAGNOSIS — Z8261 Family history of arthritis: Secondary | ICD-10-CM | POA: Insufficient documentation

## 2017-11-01 DIAGNOSIS — Z8379 Family history of other diseases of the digestive system: Secondary | ICD-10-CM | POA: Insufficient documentation

## 2017-11-01 MED ORDER — LOPERAMIDE HCL 2 MG PO TABS: 2.0000 mg | ORAL_TABLET | Freq: Four times a day (QID) | ORAL | 0 refills | Status: AC | PRN

## 2017-11-01 MED ORDER — FLUTICASONE FUROATE-VILANTEROL 200-25 MCG/INH IN AEPB: 1.0000 | INHALATION_SPRAY | Freq: Every day | RESPIRATORY_TRACT | 6 refills | Status: DC

## 2017-11-01 MED ORDER — ALBUTEROL SULFATE (2.5 MG/3ML) 0.083% IN NEBU: 2.5000 mg | INHALATION_SOLUTION | Freq: Four times a day (QID) | RESPIRATORY_TRACT | 12 refills | Status: DC | PRN

## 2017-11-01 MED FILL — ?LOPERAMIDE 2 MG CAPSULE: 2 | 10 days supply | Qty: 40 | Fill #0

## 2017-11-01 MED FILL — !BREO ELLIPTA 200-25 MCG: 200-25 | 30 days supply | Qty: 60 | Fill #0

## 2017-11-01 MED FILL — ?ALBUTEROL SUL 2.5 MG/3 MLS: (2.5 MG/3ML | 8 days supply | Qty: 90 | Fill #0

## 2017-11-01 NOTE — Patient Instructions (Addendum)
Viral Gastroenteritis, Adult Viral gastroenteritis is also known as the stomach flu. This condition is caused by certain germs (viruses). These germs can be passed from person to person very easily (are very contagious). This condition can cause sudden watery poop (diarrhea), fever, and throwing up (vomiting). Having watery poop and throwing up can make you feel weak and cause you to get dehydrated. Dehydration can make you tired and thirsty, make you have a dry mouth, and make it so you pee (urinate) less often. Older adults and people with other diseases or a weak defense system (immune system) are at higher risk for dehydration. It is important to replace the fluids that you lose from having watery poop and throwing up. Follow these instructions at home: Follow instructions from your doctor about how to care for yourself at home. Eating and drinking  Follow these instructions as told by your doctor:  Take an oral rehydration solution (ORS). This is a drink that is sold at pharmacies and stores.  Drink clear fluids in small amounts as you are able, such as: ? Water. ? Ice chips. ? Diluted fruit juice. ? Low-calorie sports drinks.  Eat bland, easy-to-digest foods in small amounts as you are able, such as: ? Bananas. ? Applesauce. ? Rice. ? Low-fat (lean) meats. ? Toast. ? Crackers.  Avoid fluids that have a lot of sugar or caffeine in them.  Avoid alcohol.  Avoid spicy or fatty foods.  General instructions  Drink enough fluid to keep your pee (urine) clear or pale yellow.  Wash your hands often. If you cannot use soap and water, use hand sanitizer.  Make sure that all people in your home wash their hands well and often.  Rest at home while you get better.  Take over-the-counter and prescription medicines only as told by your doctor.  Watch your condition for any changes.  Take a warm bath to help with any burning or pain from having watery poop.  Keep all follow-up  visits as told by your doctor. This is important. Contact a doctor if:  You cannot keep fluids down.  Your symptoms get worse.  You have new symptoms.  You feel light-headed or dizzy.  You have muscle cramps. Get help right away if:  You have chest pain.  You feel very weak or you pass out (faint).  You see blood in your throw-up.  Your throw-up looks like coffee grounds.  You have bloody or black poop (stools) or poop that look like tar.  You have a very bad headache, a stiff neck, or both.  You have a rash.  You have very bad pain, cramping, or bloating in your belly (abdomen).  You have trouble breathing.  You are breathing very quickly.  Your heart is beating very quickly.  Your skin feels cold and clammy.  You feel confused.  You have pain when you pee.  You have signs of dehydration, such as: ? Dark pee, hardly any pee, or no pee. ? Cracked lips. ? Dry mouth. ? Sunken eyes. ? Sleepiness. ? Weakness. This information is not intended to replace advice given to you by your health care provider. Make sure you discuss any questions you have with your health care provider. Document Released: 04/16/2008 Document Revised: 05/18/2016 Document Reviewed: 07/05/2015 Elsevier Interactive Patient Education  2017 [employer]  Viral Gastroenteritis, Adult Viral gastroenteritis is also known as the stomach flu. This condition is caused by certain germs (viruses). These germs can be passed from person to  person very easily (are very contagious). This condition can cause sudden watery poop (diarrhea), fever, and throwing up (vomiting). Having watery poop and throwing up can make you feel weak and cause you to get dehydrated. Dehydration can make you tired and thirsty, make you have a dry mouth, and make it so you pee (urinate) less often. Older adults and people with other diseases or a weak defense system (immune system) are at higher risk for dehydration. It is  important to replace the fluids that you lose from having watery poop and throwing up. Follow these instructions at home: Follow instructions from your doctor about how to care for yourself at home. Eating and drinking  Follow these instructions as told by your doctor:  Take an oral rehydration solution (ORS). This is a drink that is sold at pharmacies and stores.  Drink clear fluids in small amounts as you are able, such as: ? Water. ? Ice chips. ? Diluted fruit juice. ? Low-calorie sports drinks.  Eat bland, easy-to-digest foods in small amounts as you are able, such as: ? Bananas. ? Applesauce. ? Rice. ? Low-fat (lean) meats. ? Toast. ? Crackers.  Avoid fluids that have a lot of sugar or caffeine in them.  Avoid alcohol.  Avoid spicy or fatty foods.  General instructions  Drink enough fluid to keep your pee (urine) clear or pale yellow.  Wash your hands often. If you cannot use soap and water, use hand sanitizer.  Make sure that all people in your home wash their hands well and often.  Rest at home while you get better.  Take over-the-counter and prescription medicines only as told by your doctor.  Watch your condition for any changes.  Take a warm bath to help with any burning or pain from having watery poop.  Keep all follow-up visits as told by your doctor. This is important. Contact a doctor if:  You cannot keep fluids down.  Your symptoms get worse.  You have new symptoms.  You feel light-headed or dizzy.  You have muscle cramps. Get help right away if:  You have chest pain.  You feel very weak or you pass out (faint).  You see blood in your throw-up.  Your throw-up looks like coffee grounds.  You have bloody or black poop (stools) or poop that look like tar.  You have a very bad headache, a stiff neck, or both.  You have a rash.  You have very bad pain, cramping, or bloating in your belly (abdomen).  You have trouble  breathing.  You are breathing very quickly.  Your heart is beating very quickly.  Your skin feels cold and clammy.  You feel confused.  You have pain when you pee.  You have signs of dehydration, such as: ? Dark pee, hardly any pee, or no pee. ? Cracked lips. ? Dry mouth. ? Sunken eyes. ? Sleepiness. ? Weakness. This information is not intended to replace advice given to you by your health care provider. Make sure you discuss any questions you have with your health care provider. Document Released: 04/16/2008 Document Revised: 05/18/2016 Document Reviewed: 07/05/2015 Elsevier Interactive Patient Education  2017 [place] Choices to Help Relieve Diarrhea, Adult When you have diarrhea, the foods you eat and your eating habits are very important. Choosing the right foods and drinks can help:  Relieve diarrhea.  Replace lost fluids and nutrients.  Prevent dehydration.  What general guidelines should I follow? Relieving diarrhea  Choose foods with  less than 2 g or .07 oz. of fiber per serving.  Limit fats to less than 8 tsp (38 g or 1.34 oz.) a day.  Avoid the following: ? Foods and beverages sweetened with high-fructose corn syrup, honey, or sugar alcohols such as xylitol, sorbitol, and mannitol. ? Foods that contain a lot of fat or sugar. ? Fried, greasy, or spicy foods. ? High-fiber grains, breads, and cereals. ? Raw fruits and vegetables.  Eat foods that are rich in probiotics. These foods include dairy products such as yogurt and fermented milk products. They help increase healthy bacteria in the stomach and intestines (gastrointestinal tract, or GI tract).  If you have lactose intolerance, avoid dairy products. These may make your diarrhea worse.  Take medicine to help stop diarrhea (antidiarrheal medicine) only as told by your health care provider. Replacing nutrients  Eat small meals or snacks every 3-4 hours.  Eat bland foods, such as white rice,  toast, or baked potato, until your diarrhea starts to get better. Gradually reintroduce nutrient-rich foods as tolerated or as told by your health care provider. This includes: ? Well-cooked protein foods. ? Peeled, seeded, and soft-cooked fruits and vegetables. ? Low-fat dairy products.  Take vitamin and mineral supplements as told by your health care provider. Preventing dehydration   Start by sipping water or a special solution to prevent dehydration (oral rehydration solution, ORS). Urine that is clear or pale yellow means that you are getting enough fluid.  Try to drink at least 8-10 cups of fluid each day to help replace lost fluids.  You may add other liquids in addition to water, such as clear juice or decaffeinated sports drinks, as tolerated or as told by your health care provider.  Avoid drinks with caffeine, such as coffee, tea, or soft drinks.  Avoid alcohol. What foods are recommended? The items listed may not be a complete list. Talk with your health care provider about what dietary choices are best for you. Grains White rice. White, [place], or pita breads (fresh or toasted), including plain rolls, buns, or bagels. White pasta. Saltine, soda, or graham crackers. Pretzels. Low-fiber cereal. Cooked cereals made with water (such as cornmeal, farina, or cream cereals). Plain muffins. Matzo. Melba toast. Zwieback. Vegetables Potatoes (without the skin). Most well-cooked and canned vegetables without skins or seeds. Tender lettuce. Fruits Apple sauce. Fruits canned in juice. Cooked apricots, cherries, grapefruit, peaches, pears, or plums. Fresh bananas and cantaloupe. Meats and other protein foods Baked or boiled chicken. Eggs. Tofu. Fish. Seafood. Smooth nut butters. Ground or well-cooked tender beef, ham, veal, lamb, pork, or poultry. Dairy Plain yogurt, kefir, and unsweetened liquid yogurt. Lactose-free milk, buttermilk, skim milk, or soy milk. Low-fat or nonfat hard  cheese. Beverages Water. Low-calorie sports drinks. Fruit juices without pulp. Strained tomato and vegetable juices. Decaffeinated teas. Sugar-free beverages not sweetened with sugar alcohols. Oral rehydration solutions, if approved by your health care provider. Seasoning and other foods Bouillon, broth, or soups made from recommended foods. What foods are not recommended? The items listed may not be a complete list. Talk with your health care provider about what dietary choices are best for you. Grains Whole grain, whole wheat, bran, or rye breads, rolls, pastas, and crackers. Wild or brown rice. Whole grain or bran cereals. Barley. Oats and oatmeal. Corn tortillas or taco shells. Granola. Popcorn. Vegetables Raw vegetables. Fried vegetables. Cabbage, broccoli, Brussels sprouts, artichokes, baked beans, beet greens, corn, kale, legumes, peas, sweet potatoes, and yams. Potato skins. Cooked spinach  and cabbage. Fruits Dried fruit, including raisins and dates. Raw fruits. Stewed or dried prunes. Canned fruits with syrup. Meat and other protein foods Fried or fatty meats. Deli meats. Chunky nut butters. Nuts and seeds. Beans and lentils. Berniece Salines. Hot dogs. Sausage. Dairy High-fat cheeses. Whole milk, chocolate milk, and beverages made with milk, such as milk shakes. Half-and-half. Cream. sour cream. Ice cream. Beverages Caffeinated beverages (such as coffee, tea, soda, or energy drinks). Alcoholic beverages. Fruit juices with pulp. Prune juice. Soft drinks sweetened with high-fructose corn syrup or sugar alcohols. High-calorie sports drinks. Fats and oils Butter. Cream sauces. Margarine. Salad oils. Plain salad dressings. Olives. Avocados. Mayonnaise. Sweets and desserts Sweet rolls, doughnuts, and sweet breads. Sugar-free desserts sweetened with sugar alcohols such as xylitol and sorbitol. Seasoning and other foods Honey. Hot sauce. Chili powder. Gravy. Cream-based or milk-based soups. Pancakes  and waffles. Summary  When you have diarrhea, the foods you eat and your eating habits are very important.  Make sure you get at least 8-10 cups of fluid each day, or enough to keep your urine clear or pale yellow.  Eat bland foods and gradually reintroduce healthy, nutrient-rich foods as tolerated, or as told by your health care provider.  Avoid high-fiber, fried, greasy, or spicy foods. This information is not intended to replace advice given to you by your health care provider. Make sure you discuss any questions you have with your health care provider. Document Released: 01/19/2004 Document Revised: 10/26/2016 Document Reviewed: 10/26/2016 Elsevier Interactive Patient Education  2018 [place] Choices to Help Relieve Diarrhea, Adult When you have diarrhea, the foods you eat and your eating habits are very important. Choosing the right foods and drinks can help:  Relieve diarrhea.  Replace lost fluids and nutrients.  Prevent dehydration.  What general guidelines should I follow? Relieving diarrhea  Choose foods with less than 2 g or .07 oz. of fiber per serving.  Limit fats to less than 8 tsp (38 g or 1.34 oz.) a day.  Avoid the following: ? Foods and beverages sweetened with high-fructose corn syrup, honey, or sugar alcohols such as xylitol, sorbitol, and mannitol. ? Foods that contain a lot of fat or sugar. ? Fried, greasy, or spicy foods. ? High-fiber grains, breads, and cereals. ? Raw fruits and vegetables.  Eat foods that are rich in probiotics. These foods include dairy products such as yogurt and fermented milk products. They help increase healthy bacteria in the stomach and intestines (gastrointestinal tract, or GI tract).  If you have lactose intolerance, avoid dairy products. These may make your diarrhea worse.  Take medicine to help stop diarrhea (antidiarrheal medicine) only as told by your health care provider. Replacing nutrients  Eat small  meals or snacks every 3-4 hours.  Eat bland foods, such as white rice, toast, or baked potato, until your diarrhea starts to get better. Gradually reintroduce nutrient-rich foods as tolerated or as told by your health care provider. This includes: ? Well-cooked protein foods. ? Peeled, seeded, and soft-cooked fruits and vegetables. ? Low-fat dairy products.  Take vitamin and mineral supplements as told by your health care provider. Preventing dehydration   Start by sipping water or a special solution to prevent dehydration (oral rehydration solution, ORS). Urine that is clear or pale yellow means that you are getting enough fluid.  Try to drink at least 8-10 cups of fluid each day to help replace lost fluids.  You may add other liquids in addition to water, such  as clear juice or decaffeinated sports drinks, as tolerated or as told by your health care provider.  Avoid drinks with caffeine, such as coffee, tea, or soft drinks.  Avoid alcohol. What foods are recommended? The items listed may not be a complete list. Talk with your health care provider about what dietary choices are best for you. Grains White rice. White, [place], or pita breads (fresh or toasted), including plain rolls, buns, or bagels. White pasta. Saltine, soda, or graham crackers. Pretzels. Low-fiber cereal. Cooked cereals made with water (such as cornmeal, farina, or cream cereals). Plain muffins. Matzo. Melba toast. Zwieback. Vegetables Potatoes (without the skin). Most well-cooked and canned vegetables without skins or seeds. Tender lettuce. Fruits Apple sauce. Fruits canned in juice. Cooked apricots, cherries, grapefruit, peaches, pears, or plums. Fresh bananas and cantaloupe. Meats and other protein foods Baked or boiled chicken. Eggs. Tofu. Fish. Seafood. Smooth nut butters. Ground or well-cooked tender beef, ham, veal, lamb, pork, or poultry. Dairy Plain yogurt, kefir, and unsweetened liquid yogurt. Lactose-free  milk, buttermilk, skim milk, or soy milk. Low-fat or nonfat hard cheese. Beverages Water. Low-calorie sports drinks. Fruit juices without pulp. Strained tomato and vegetable juices. Decaffeinated teas. Sugar-free beverages not sweetened with sugar alcohols. Oral rehydration solutions, if approved by your health care provider. Seasoning and other foods Bouillon, broth, or soups made from recommended foods. What foods are not recommended? The items listed may not be a complete list. Talk with your health care provider about what dietary choices are best for you. Grains Whole grain, whole wheat, bran, or rye breads, rolls, pastas, and crackers. Wild or brown rice. Whole grain or bran cereals. Barley. Oats and oatmeal. Corn tortillas or taco shells. Granola. Popcorn. Vegetables Raw vegetables. Fried vegetables. Cabbage, broccoli, Brussels sprouts, artichokes, baked beans, beet greens, corn, kale, legumes, peas, sweet potatoes, and yams. Potato skins. Cooked spinach and cabbage. Fruits Dried fruit, including raisins and dates. Raw fruits. Stewed or dried prunes. Canned fruits with syrup. Meat and other protein foods Fried or fatty meats. Deli meats. Chunky nut butters. Nuts and seeds. Beans and lentils. Berniece Salines. Hot dogs. Sausage. Dairy High-fat cheeses. Whole milk, chocolate milk, and beverages made with milk, such as milk shakes. Half-and-half. Cream. sour cream. Ice cream. Beverages Caffeinated beverages (such as coffee, tea, soda, or energy drinks). Alcoholic beverages. Fruit juices with pulp. Prune juice. Soft drinks sweetened with high-fructose corn syrup or sugar alcohols. High-calorie sports drinks. Fats and oils Butter. Cream sauces. Margarine. Salad oils. Plain salad dressings. Olives. Avocados. Mayonnaise. Sweets and desserts Sweet rolls, doughnuts, and sweet breads. Sugar-free desserts sweetened with sugar alcohols such as xylitol and sorbitol. Seasoning and other foods Honey. Hot sauce.  Chili powder. Gravy. Cream-based or milk-based soups. Pancakes and waffles. Summary  When you have diarrhea, the foods you eat and your eating habits are very important.  Make sure you get at least 8-10 cups of fluid each day, or enough to keep your urine clear or pale yellow.  Eat bland foods and gradually reintroduce healthy, nutrient-rich foods as tolerated, or as told by your health care provider.  Avoid high-fiber, fried, greasy, or spicy foods. This information is not intended to replace advice given to you by your health care provider. Make sure you discuss any questions you have with your health care provider. Document Released: 01/19/2004 Document Revised: 10/26/2016 Document Reviewed: 10/26/2016 Elsevier Interactive Patient Education  [employer].

## 2017-11-01 NOTE — Progress Notes (Signed)
Assessment & Plan:  Levi Alvarez was seen today for gi problem.  Diagnoses and all orders for this visit:  Chronic obstructive pulmonary disease, unspecified COPD type ([place]) -     fluticasone furoate-vilanterol (BREO ELLIPTA) 200-25 MCG/INH AEPB; Inhale 1 puff into the lungs daily. -     albuterol (PROVENTIL) (2.5 MG/3ML) 0.083% nebulizer solution; Take 3 mLs (2.5 mg total) by nebulization every 6 (six) hours as needed for wheezing or shortness of breath.  Gastroenteritis -     loperamide (IMODIUM A-D) 2 MG tablet; Take 1 tablet (2 mg total) by mouth 4 (four) times daily as needed for up to 10 days for diarrhea or loose stools.  Ventral hernia without obstruction or gangrene -     Ambulatory referral to General Surgery  Tobacco dependence Levi Alvarez was counseled on the dangers of tobacco use, and was advised to quit. Reviewed strategies to maximize success, including removing cigarettes and smoking materials from environment, stress management and support of family/friends as well as pharmacological alternatives including: Wellbutrin, Chantix, Nicotine patch, Nicotine gum or lozenges. Smoking cessation support: smoking cessation hotline: 1-800-QUIT-NOW.  Smoking cessation classes are also available through [facility] and [facility]. Call [phone] or visit our website at [email or web address]   Spent 5 minutes counseling on smoking cessation and patient is not ready to quit.  Patient has been counseled on age-appropriate routine health concerns for screening and prevention. These are reviewed and up-to-date. Referrals have been placed accordingly. Immunizations are up-to-date or declined.    Subjective:   Chief Complaint  Patient presents with  . GI Problem    Patient stated he is having bad diarrhea and stomach pain for 2 weeks. Patient stated that he feel a lump on the left side of his abdominal and its a sharp stabbing pain. Patient stated that he took his temperature and it was  high around 100. Patient stated he think he had a fever.    HPI Levi Alvarez 46 y.o. male presents to office today with complaints of abdominal pain and diarrhea.  Gastroenteritis Patient complains of abdominal pain and diarrhea over the past 2 weeks. He reports the diarrhea has started to improve.  There is no report of acholic stools, blood in stool, constipation, dark urine, dysuria, heartburn, hematuria and melena. Patient's oral intake has been normal.  Patient's urine output has been adequate.  Other contacts with similar symptoms include NONE.  Patient denies recent travel history. Patient denies recent ingestion of possible contaminated food, toxic plants, inappropriate medications/poisons. However he does endorse eating out at multiple restaurants recently but reports these restaurants he has frequented before.   COPD Most recent spirometry results normal however patient had spirometry results obtained while taking BREO. Will continue on BREO for now. He does report some relief of shortness of breath with taking it. He need to stop smoking. Prior to [facility] he was using his albuterol inhaler multiple times throughout the day. He has Sleep apnea. Needs CPAP. I will contact sleep disorder center [phone] to obtain settings required and have script for patient to take to medical supply store.   Review of Systems  Constitutional: Negative for fever, malaise/fatigue and weight loss.  HENT: Negative.  Negative for nosebleeds.   Eyes: Negative.  Negative for blurred vision, double vision and photophobia.  Respiratory: Positive for shortness of breath and wheezing. Negative for cough.   Cardiovascular: Negative.  Negative for chest pain, palpitations and leg swelling.  Gastrointestinal: Positive for abdominal pain and diarrhea.  Negative for constipation, heartburn, nausea and vomiting.  Musculoskeletal: Negative.  Negative for myalgias.  Neurological: Negative.  Negative for dizziness, focal  weakness, seizures and headaches.  Endo/Heme/Allergies: Negative for environmental allergies.  Psychiatric/Behavioral: Negative.  Negative for suicidal ideas.    Past Medical History:  Diagnosis Date  . Hepatitis C antibody positive in blood   . Hypertension     Past Surgical History:  Procedure Laterality Date  . arm surgery    . KNEE SURGERY      Family History  Problem Relation Age of Onset  . Colitis Mother   . Arthritis Mother   . Cancer Father        lymphoma?  . Cancer Paternal Grandfather        bone  . Heart disease Neg Hx   . Stroke Neg Hx   . Diabetes Neg Hx     Social History Reviewed with no changes to be made today.   Outpatient Medications Prior to Visit  Medication Sig Dispense Refill  . albuterol (PROAIR HFA) 108 (90 Base) MCG/ACT inhaler Inhale 1 puff every 6 (six) hours as needed into the lungs for wheezing or shortness of breath. 18 g 0  . carvedilol (COREG) 3.125 MG tablet Take 1 tablet (3.125 mg total) by mouth 2 (two) times daily. 180 tablet 1  . chlorthalidone (HYGROTON) 25 MG tablet Take 1 tablet (25 mg total) 1 day or 1 dose by mouth. 90 tablet 0  . potassium chloride (KLOR-CON 10) 10 MEQ tablet Take 1 tablet (10 mEq total) daily by mouth. 90 tablet 0  . Sofosbuvir-Velpatasvir (EPCLUSA) 400-100 MG TABS Take 1 tablet by mouth daily. 28 tablet 2  . traMADol (ULTRAM) 50 MG tablet Take 1 tablet (50 mg total) by mouth every 8 (eight) hours as needed. for pain 20 tablet 0   No facility-administered medications prior to visit.     Allergies  Allergen Reactions  . Naproxen Hives and Swelling       Objective:    BP 101/62 (BP Location: Left Arm, Patient Position: Sitting, Cuff Size: Large)   Pulse 79   Temp 98.5 F (36.9 C) (Oral)   Ht 5\' 10"  (1.778 m)   Wt 250 lb 9.6 oz (113.7 kg)   SpO2 97%   BMI 35.96 kg/m  Wt Readings from Last 3 Encounters:  11/01/17 250 lb 9.6 oz (113.7 kg)  10/08/17 247 lb (112 kg)  09/20/17 247 lb 6.4 oz (112.2  kg)    Physical Exam  Constitutional: He is oriented to person, place, and time. He appears well-developed and well-nourished. He is cooperative.  HENT:  Head: Normocephalic and atraumatic.  Eyes: EOM are normal.  Neck: Normal range of motion.  Cardiovascular: Normal rate, regular rhythm, normal heart sounds and intact distal pulses. Exam reveals no gallop and no friction rub.  No murmur heard. Pulmonary/Chest: Effort normal and breath sounds normal. No tachypnea. No respiratory distress. He has no decreased breath sounds. He has no wheezes. He has no rhonchi. He has no rales. He exhibits no tenderness.  Abdominal: Soft. Bowel sounds are normal. He exhibits no distension and no mass. There is tenderness in the right upper quadrant, epigastric area and periumbilical area. There is no rigidity, no rebound, no guarding, no tenderness at McBurney's point and negative Murphy's sign. A hernia is present. Hernia confirmed positive in the ventral area (large).  Musculoskeletal: Normal range of motion. He exhibits no edema.  Neurological: He is alert and oriented to person,  place, and time. Coordination normal.  Skin: Skin is warm and dry.  Psychiatric: He has a normal mood and affect. His behavior is normal. Judgment and thought content normal.  Nursing note and vitals reviewed.      Patient has been counseled extensively about nutrition and exercise as well as the importance of adherence with medications and regular follow-up. The patient was given clear instructions to go to ER or return to medical center if symptoms don't improve, worsen or new problems develop. The patient verbalized understanding.   Follow-up: No Follow-up on file.   Gildardo Pounds, FNP-BC [facility] and [place], [place]   11/05/2017, 11:48 PM

## 2017-11-05 ENCOUNTER — Encounter: Payer: Self-pay | Admitting: Nurse Practitioner

## 2017-11-14 ENCOUNTER — Encounter: Payer: Self-pay | Admitting: Pharmacy Technician

## 2017-11-14 MED FILL — ?CHLORTHALIDONE 25 MG TABLE: 25 | 30 days supply | Qty: 30 | Fill #2

## 2017-11-18 ENCOUNTER — Ambulatory Visit: Payer: Self-pay

## 2017-11-19 ENCOUNTER — Ambulatory Visit (INDEPENDENT_AMBULATORY_CARE_PROVIDER_SITE_OTHER): Payer: Self-pay | Admitting: Pharmacist

## 2017-11-19 ENCOUNTER — Other Ambulatory Visit: Payer: Self-pay

## 2017-11-19 DIAGNOSIS — B182 Chronic viral hepatitis C: Secondary | ICD-10-CM

## 2017-11-19 NOTE — Progress Notes (Signed)
[place] for Infectious Disease Pharmacy Visit  HPI: Levi Alvarez is a 47 y.o. male who presents to the [place] clinic for Hep C follow-up. He has genotype 1a, F4, and started 12 weeks of Epclusa on 12/24.  Patient Active Problem List   Diagnosis Date Noted  . Chronic hepatitis C without hepatic coma ([place]) 10/08/2017  . Cirrhosis ([place]) 10/08/2017  . Chronic pain of left knee 07/23/2017  . HTN (hypertension) 07/12/2017  . OSA (obstructive sleep apnea) 07/12/2017  . Class 2 obesity with serious comorbidity and body mass index (BMI) of 35.0 to 35.9 in adult 07/12/2017  . Elevated liver enzymes 07/12/2017  . Other chronic pain 07/12/2017  . Smoker 07/12/2017  . Hyperlipidemia 07/12/2017  . Polyarthralgia 07/12/2017  . Snoring 06/05/2017      Medication List        Accurate as of 11/19/17  4:40 PM. Always use your most recent med list.          * albuterol 108 (90 Base) MCG/ACT inhaler Commonly known as:  PROAIR HFA Inhale 1 puff every 6 (six) hours as needed into the lungs for wheezing or shortness of breath.   * albuterol (2.5 MG/3ML) 0.083% nebulizer solution Commonly known as:  PROVENTIL Take 3 mLs (2.5 mg total) by nebulization every 6 (six) hours as needed for wheezing or shortness of breath.   carvedilol 3.125 MG tablet Commonly known as:  COREG Take 1 tablet (3.125 mg total) by mouth 2 (two) times daily.   chlorthalidone 25 MG tablet Commonly known as:  HYGROTON Take 1 tablet (25 mg total) 1 day or 1 dose by mouth.   fluticasone furoate-vilanterol 200-25 MCG/INH Aepb Commonly known as:  BREO ELLIPTA Inhale 1 puff into the lungs daily.   hydrochlorothiazide 12.5 MG tablet Commonly known as:  HYDRODIURIL   MULTI-VITAMINS Tabs   potassium chloride 10 MEQ tablet Commonly known as:  KLOR-CON 10 Take 1 tablet (10 mEq total) daily by mouth.   Sofosbuvir-Velpatasvir 400-100 MG Tabs Commonly known as:  EPCLUSA Take 1 tablet by mouth daily.     Thiamine 50 MG Caps   traMADol 50 MG tablet Commonly known as:  ULTRAM Take 1 tablet (50 mg total) by mouth every 8 (eight) hours as needed. for pain      * This list has 2 medication(s) that are the same as other medications prescribed for you. Read the directions carefully, and ask your doctor or other care provider to review them with you.          Allergies: Allergies  Allergen Reactions  . Naproxen Hives and Swelling    Past Medical History: Past Medical History:  Diagnosis Date  . Hepatitis C antibody positive in blood   . Hypertension     Social History: Social History   Socioeconomic History  . Marital status: Single    Spouse name: Not on file  . Number of children: Not on file  . Years of education: Not on file  . Highest education level: Not on file  Social Needs  . Financial resource strain: Not on file  . Food insecurity - worry: Not on file  . Food insecurity - inability: Not on file  . Transportation needs - medical: Not on file  . Transportation needs - non-medical: Not on file  Occupational History  . Not on file  Tobacco Use  . Smoking status: Current Every Day Smoker    Packs/day: 1.00    Types: Cigarettes  .  Smokeless tobacco: Former Systems developer    Types: Chew  Substance and Sexual Activity  . Alcohol use: Yes    Alcohol/week: 10.8 oz    Types: 18 Cans of beer per week    Comment: daily  . Drug use: No  . Sexual activity: Yes    Partners: Female  Other Topics Concern  . Not on file  Social History Narrative  . Not on file    Labs: Hep B S Ab (no units)  Date Value  10/08/2017 NON-REACTIVE   Hepatitis B Surface Ag (no units)  Date Value  07/12/2017 NON-REACTIVE   HCV Ab (no units)  Date Value  07/12/2017 REACTIVE (A)    Lab Results  Component Value Date   HCVGENOTYPE 1a 10/08/2017    Hepatitis C RNA quantitative Latest Ref Rng & Units 07/12/2017  HCV Quantitative NOT DETECTED IU/mL 6,460,000(H)  HCV Quantitative Log NOT  DETECTED Log IU/mL 6.81(H)    AST (U/L)  Date Value  07/12/2017 284 (H)  06/05/2017 296 (H)  07/05/2016 65 (H)   ALT (U/L)  Date Value  07/12/2017 164 (H)  06/05/2017 158 (H)  07/05/2016 85 (H)   INR (no units)  Date Value  10/08/2017 1.1    Fibrosis Score: F4 as assessed by ARFI   Child-Pugh Score: A  Assessment: Levi Alvarez is here today to follow-up with me for his Hep C infection. He started 12 weeks of Epclusa on Christmas Eve. He is having some headaches with the Epclusa.  He takes Tramadol for them which helps. He states he also has decreased appetite but is ok with it as he is losing weight.  I told him this likely wasn't a side effect of the Epclusa but to monitor it. He hasn't missed a single dose and takes it at 7pm every day.  His wife was with him today. He has an appointment with GI on Thursday. He has [facility] financial assistance, so I will get a Hep C viral load today and have him come back when he is done. He will follow up with Dr. Linus Salmons for his cure visit once the time comes.   Plan: - Continue Epclusa x 12 wks - Hep C viral load today - F/u with me again 02/04/18 4pm  Cassie L. Kuppelweiser, PharmD, [place], [place] for Infectious Disease 11/19/2017, 4:40 PM

## 2017-11-20 ENCOUNTER — Telehealth: Payer: Self-pay

## 2017-11-20 ENCOUNTER — Telehealth: Payer: Self-pay | Admitting: Nurse Practitioner

## 2017-11-20 ENCOUNTER — Encounter: Payer: Self-pay | Admitting: Nurse Practitioner

## 2017-11-20 ENCOUNTER — Ambulatory Visit: Payer: Self-pay | Attending: Nurse Practitioner | Admitting: Nurse Practitioner

## 2017-11-20 VITALS — BP 116/75 | HR 79 | Temp 98.5°F | Ht 70.0 in | Wt 234.0 lb

## 2017-11-20 DIAGNOSIS — G8929 Other chronic pain: Secondary | ICD-10-CM

## 2017-11-20 DIAGNOSIS — R748 Abnormal levels of other serum enzymes: Secondary | ICD-10-CM

## 2017-11-20 DIAGNOSIS — I10 Essential (primary) hypertension: Secondary | ICD-10-CM

## 2017-11-20 DIAGNOSIS — Z79899 Other long term (current) drug therapy: Secondary | ICD-10-CM | POA: Insufficient documentation

## 2017-11-20 DIAGNOSIS — R918 Other nonspecific abnormal finding of lung field: Secondary | ICD-10-CM

## 2017-11-20 DIAGNOSIS — G4733 Obstructive sleep apnea (adult) (pediatric): Secondary | ICD-10-CM

## 2017-11-20 MED ORDER — TRAMADOL HCL 50 MG PO TABS
50.0000 mg | ORAL_TABLET | Freq: Three times a day (TID) | ORAL | 0 refills | Status: AC | PRN
Start: 2017-11-20 — End: 2017-12-20

## 2017-11-20 MED FILL — traMADol HCL 50 MG TABS: 50 | 10 days supply | Qty: 30 | Fill #0

## 2017-11-20 NOTE — Patient Instructions (Addendum)
Health Risks of Smoking Smoking cigarettes is very bad for your health. Tobacco smoke has over 200 known poisons in it. It contains the poisonous gases nitrogen oxide and carbon monoxide. There are over 60 chemicals in tobacco smoke that cause cancer. Smoking is difficult to quit because a chemical in tobacco, called nicotine, causes addiction or dependence. When you smoke and inhale, nicotine is absorbed rapidly into the bloodstream through your lungs. Both inhaled and non-inhaled nicotine may be addictive. What are the risks of cigarette smoke? Cigarette smokers have an increased risk of many serious medical problems, including:  Lung cancer.  Lung disease, such as pneumonia, bronchitis, and emphysema.  Chest pain (angina) and heart attack because the heart is not getting enough oxygen.  Heart disease and peripheral blood vessel disease.  High blood pressure (hypertension).  Stroke.  Oral cancer, including cancer of the lip, mouth, or voice box.  Bladder cancer.  Pancreatic cancer.  Cervical cancer.  Pregnancy complications, including premature birth.  Stillbirths and smaller newborn babies, birth defects, and genetic damage to sperm.  Early menopause.  Lower estrogen level for women.  Infertility.  Facial wrinkles.  Blindness.  Increased risk of broken bones (fractures).  Senile dementia.  Stomach ulcers and internal bleeding.  Delayed wound healing and increased risk of complications during surgery.  Even smoking lightly shortens your life expectancy by several years.  Because of secondhand smoke exposure, children of smokers have an increased risk of the following:  Sudden infant death syndrome (SIDS).  Respiratory infections.  Lung cancer.  Heart disease.  Ear infections.  What are the benefits of quitting? There are many health benefits of quitting smoking. Here are some of them:  Within days of quitting smoking, your risk of having a heart  attack decreases, your blood flow improves, and your lung capacity improves. Blood pressure, pulse rate, and breathing patterns start returning to normal soon after quitting.  Within months, your lungs may clear up completely.  Quitting for 10 years reduces your risk of developing lung cancer and heart disease to almost that of a nonsmoker.  People who quit may see an improvement in their overall quality of life.  How do I quit smoking? Smoking is an addiction with both physical and psychological effects, and longtime habits can be hard to change. Your health care provider can recommend:  Programs and community resources, which may include group support, education, or talk therapy.  Prescription medicines to help reduce cravings.  Nicotine replacement products, such as patches, gum, and nasal sprays. Use these products only as directed. Do not replace cigarette smoking with electronic cigarettes, which are commonly called e-cigarettes. The safety of e-cigarettes is not known, and some may contain harmful chemicals.  A combination of two or more of these methods.  Where to find more information:  American Lung Association: www.lung.org  American Cancer Society: www.cancer.org Summary  Smoking cigarettes is very bad for your health. Cigarette smokers have an increased risk of many serious medical problems, including several cancers, heart disease, and stroke.  Smoking is an addiction with both physical and psychological effects, and longtime habits can be hard to change.  By stopping right away, you can greatly reduce the risk of medical problems for you and your family.  To help you quit smoking, your health care provider can recommend programs, community resources, prescription medicines, and nicotine replacement products such as patches, gum, and nasal sprays. This information is not intended to replace advice given to you by your health   care provider. Make sure you discuss any  questions you have with your health care provider. Document Released: 12/06/2004 Document Revised: 11/02/2016 Document Reviewed: 11/02/2016 Elsevier Interactive Patient Education  2017 [place] Risks of Smoking Smoking cigarettes is very bad for your health. Tobacco smoke has over 200 known poisons in it. It contains the poisonous gases nitrogen oxide and carbon monoxide. There are over 60 chemicals in tobacco smoke that cause cancer. Smoking is difficult to quit because a chemical in tobacco, called nicotine, causes addiction or dependence. When you smoke and inhale, nicotine is absorbed rapidly into the bloodstream through your lungs. Both inhaled and non-inhaled nicotine may be addictive. What are the risks of cigarette smoke? Cigarette smokers have an increased risk of many serious medical problems, including:  Lung cancer.  Lung disease, such as pneumonia, bronchitis, and emphysema.  Chest pain (angina) and heart attack because the heart is not getting enough oxygen.  Heart disease and peripheral blood vessel disease.  High blood pressure (hypertension).  Stroke.  Oral cancer, including cancer of the lip, mouth, or voice box.  Bladder cancer.  Pancreatic cancer.  Cervical cancer.  Pregnancy complications, including premature birth.  Stillbirths and smaller newborn babies, birth defects, and genetic damage to sperm.  Early menopause.  Lower estrogen level for women.  Infertility.  Facial wrinkles.  Blindness.  Increased risk of broken bones (fractures).  Senile dementia.  Stomach ulcers and internal bleeding.  Delayed wound healing and increased risk of complications during surgery.  Even smoking lightly shortens your life expectancy by several years.  Because of secondhand smoke exposure, children of smokers have an increased risk of the following:  Sudden infant death syndrome (SIDS).  Respiratory infections.  Lung cancer.  Heart  disease.  Ear infections.  What are the benefits of quitting? There are many health benefits of quitting smoking. Here are some of them:  Within days of quitting smoking, your risk of having a heart attack decreases, your blood flow improves, and your lung capacity improves. Blood pressure, pulse rate, and breathing patterns start returning to normal soon after quitting.  Within months, your lungs may clear up completely.  Quitting for 10 years reduces your risk of developing lung cancer and heart disease to almost that of a nonsmoker.  People who quit may see an improvement in their overall quality of life.  How do I quit smoking? Smoking is an addiction with both physical and psychological effects, and longtime habits can be hard to change. Your health care provider can recommend:  Programs and community resources, which may include group support, education, or talk therapy.  Prescription medicines to help reduce cravings.  Nicotine replacement products, such as patches, gum, and nasal sprays. Use these products only as directed. Do not replace cigarette smoking with electronic cigarettes, which are commonly called e-cigarettes. The safety of e-cigarettes is not known, and some may contain harmful chemicals.  A combination of two or more of these methods.  Where to find more information:  American Lung Association: www.lung.org  American Cancer Society: www.cancer.org Summary  Smoking cigarettes is very bad for your health. Cigarette smokers have an increased risk of many serious medical problems, including several cancers, heart disease, and stroke.  Smoking is an addiction with both physical and psychological effects, and longtime habits can be hard to change.  By stopping right away, you can greatly reduce the risk of medical problems for you and your family.  To help you quit smoking, your health  care provider can recommend programs, community resources, prescription  medicines, and nicotine replacement products such as patches, gum, and nasal sprays. This information is not intended to replace advice given to you by your health care provider. Make sure you discuss any questions you have with your health care provider. Document Released: 12/06/2004 Document Revised: 11/02/2016 Document Reviewed: 11/02/2016 Elsevier Interactive Patient Education  2017 [employer].  Steps to Quit Smoking Smoking tobacco can be bad for your health. It can also affect almost every organ in your body. Smoking puts you and people around you at risk for many serious long-lasting (chronic) diseases. Quitting smoking is hard, but it is one of the best things that you can do for your health. It is never too late to quit. What are the benefits of quitting smoking? When you quit smoking, you lower your risk for getting serious diseases and conditions. They can include:  Lung cancer or lung disease.  Heart disease.  Stroke.  Heart attack.  Not being able to have children (infertility).  Weak bones (osteoporosis) and broken bones (fractures).  If you have coughing, wheezing, and shortness of breath, those symptoms may get better when you quit. You may also get sick less often. If you are pregnant, quitting smoking can help to lower your chances of having a baby of low birth weight. What can I do to help me quit smoking? Talk with your doctor about what can help you quit smoking. Some things you can do (strategies) include:  Quitting smoking totally, instead of slowly cutting back how much you smoke over a period of time.  Going to in-person counseling. You are more likely to quit if you go to many counseling sessions.  Using resources and support systems, such as: ? Database administrator with a Social worker. ? Phone quitlines. ? Careers information officer. ? Support groups or group counseling. ? Text messaging programs. ? Mobile phone apps or applications.  Taking medicines. Some  of these medicines may have nicotine in them. If you are pregnant or breastfeeding, do not take any medicines to quit smoking unless your doctor says it is okay. Talk with your doctor about counseling or other things that can help you.  Talk with your doctor about using more than one strategy at the same time, such as taking medicines while you are also going to in-person counseling. This can help make quitting easier. What things can I do to make it easier to quit? Quitting smoking might feel very hard at first, but there is a lot that you can do to make it easier. Take these steps:  Talk to your family and friends. Ask them to support and encourage you.  Call phone quitlines, reach out to support groups, or work with a Social worker.  Ask people who smoke to not smoke around you.  Avoid places that make you want (trigger) to smoke, such as: ? Bars. ? Parties. ? Smoke-break areas at work.  Spend time with people who do not smoke.  Lower the stress in your life. Stress can make you want to smoke. Try these things to help your stress: ? Getting regular exercise. ? Deep-breathing exercises. ? Yoga. ? Meditating. ? Doing a body scan. To do this, close your eyes, focus on one area of your body at a time from head to toe, and notice which parts of your body are tense. Try to relax the muscles in those areas.  Download or buy apps on your mobile phone or tablet that can  help you stick to your quit plan. There are many free apps, such as QuitGuide from the [employer] Office manager for Disease Control and Prevention). You can find more support from smokefree.gov and other websites.  This information is not intended to replace advice given to you by your health care provider. Make sure you discuss any questions you have with your health care provider. Document Released: 08/25/2009 Document Revised: 06/26/2016 Document Reviewed: 03/15/2015 Elsevier Interactive Patient Education  2018 [employer].  Smoking  Tobacco Information Smoking tobacco will very likely harm your health. Tobacco contains a poisonous (toxic), colorless chemical called nicotine. Nicotine affects the brain and makes tobacco addictive. This change in your brain can make it hard to stop smoking. Tobacco also has other toxic chemicals that can hurt your body and raise your risk of many cancers. How can smoking tobacco affect me? Smoking tobacco can increase your chances of having serious health conditions, such as:  Cancer. Smoking is most commonly associated with lung cancer, but can lead to cancer in other parts of the body.  Chronic obstructive pulmonary disease (COPD). This is a long-term lung condition that makes it hard to breathe. It also gets worse over time.  High blood pressure (hypertension), heart disease, stroke, or heart attack.  Lung infections, such as pneumonia.  Cataracts. This is when the lenses in the eyes become clouded.  Digestive problems. This may include peptic ulcers, heartburn, and gastroesophageal reflux disease (GERD).  Oral health problems, such as gum disease and tooth loss.  Loss of taste and smell.  Smoking can affect your appearance by causing:  Wrinkles.  Yellow or stained teeth, fingers, and fingernails.  Smoking tobacco can also affect your social life.  Many workplaces, [employer], hotels, and public places are tobacco-free. This means that you may experience challenges in finding places to smoke when away from home.  The cost of a smoking habit can be expensive. Expenses for someone who smokes come in two ways: ? You spend money on a regular basis to buy tobacco. ? Your health care costs in the long-term are higher if you smoke.  Tobacco smoke can also affect the health of those around you. Children of smokers have greater chances of: ? Sudden infant death syndrome (SIDS). ? Ear infections. ? Lung infections.  What lifestyle changes can be made?  Do not start smoking.  Quit if you already do.  To quit smoking: ? Make a plan to quit smoking and commit yourself to it. Look for programs to help you and ask your health care provider for recommendations and ideas. ? Talk with your health care provider about using nicotine replacement medicines to help you quit. Medicine replacement medicines include gum, lozenges, patches, sprays, or pills. ? Do not replace cigarette smoking with electronic cigarettes, which are commonly called e-cigarettes. The safety of e-cigarettes is not known, and some may contain harmful chemicals. ? Avoid places, people, or situations that tempt you to smoke. ? If you try to quit but return to smoking, don't give up hope. It is very common for people to try a number of times before they fully succeed. When you feel ready again, give it another try.  Quitting smoking might affect the way you eat as well as your weight. Be prepared to monitor your eating habits. Get support in planning and following a healthy diet.  Ask your health care provider about having regular tests (screenings) to check for cancer. This may include blood tests, imaging tests, and other tests.  Exercise regularly. Consider taking walks, joining a gym, or doing yoga or exercise classes.  Develop skills to manage your stress. These skills include meditation. What are the benefits of quitting smoking? By quitting smoking, you may:  Lower your risk of getting cancer and other diseases caused by smoking.  Live longer.  Breathe better.  Lower your blood pressure and heart rate.  Stop your addiction to tobacco.  Stop creating secondhand smoke that hurts other people.  Improve your sense of taste and smell.  Look better over time, due to having fewer wrinkles and less staining.  What can happen if changes are not made? If you do not stop smoking, you may:  Get cancer and other diseases.  Develop COPD or other long-term (chronic) lung conditions.  Develop  serious problems with your heart and blood vessels (cardiovascular system).  Need more tests to screen for problems caused by smoking.  Have higher, long-term healthcare costs from medicines or treatments related to smoking.  Continue to have worsening changes in your lungs, mouth, and nose.  Where to find support: To get support to quit smoking, consider:  Asking your health care provider for more information and resources.  Taking classes to learn more about quitting smoking.  Looking for local organizations that offer resources about quitting smoking.  Joining a support group for people who want to quit smoking in your local community.  Where to find more information: You may find more information about quitting smoking from:  HelpGuide.org: www.helpguide.org/articles/addictions/how-to-quit-smoking.htm  https://hall.com/: smokefree.gov  American Lung Association: www.lung.org  Contact a health care provider if:  You have problems breathing.  Your lips, nose, or fingers turn blue.  You have chest pain.  You are coughing up blood.  You feel faint or you pass out.  You have other noticeable changes that cause you to worry. Summary  Smoking tobacco can negatively affect your health, the health of those around you, your finances, and your social life.  Do not start smoking. Quit if you already do. If you need help quitting, ask your health care provider.  Think about joining a support group for people who want to quit smoking in your local community. There are many effective programs that will help you to quit this behavior. This information is not intended to replace advice given to you by your health care provider. Make sure you discuss any questions you have with your health care provider. Document Released: 11/13/2016 Document Revised: 11/13/2016 Document Reviewed: 11/13/2016 Elsevier Interactive Patient Education  [employer].

## 2017-11-20 NOTE — Progress Notes (Signed)
Assessment & Plan:  Levi Alvarez was seen today for follow-up.  Diagnoses and all orders for this visit:  OSA (obstructive sleep apnea) We are in the process of obtaining a CPAP or having a sleep study ordered for [name]  Elevated liver enzymes -     Basic metabolic panel  Other chronic pain -     traMADol (ULTRAM) 50 MG tablet; Take 1 tablet (50 mg total) by mouth every 8 (eight) hours as needed. for pain. You can also use XS tylenol for pain  History of chronic pain; ulcers and blood in stool in the past told to avoid NSAIDs, GOODY and BC Powders. I will only refill tramadol until he is seen by pain management. He is aware. Reviewed [facility] Controlled Substance Reporting System. No unauthorized fills of controlled medications     Abnormal findings on diagnostic imaging of lung from 2016  -     [place]; Future    Patient has been counseled on age-appropriate routine health concerns for screening and prevention. These are reviewed and up-to-date. Referrals have been placed accordingly. Immunizations are up-to-date or declined.    Subjective:   Chief Complaint  Patient presents with  . Follow-up    Patient is here for a CPAP. Patient stated his whole body are in pain and would like some pain medications.    HPI Levi Alvarez 47 y.o. male presents to office today with complaints of chronic pain.   Chronic Pain He has a history of heavy alcohol use but no substance abuse according to record review. He has taken Ultram prn in the past which he reports worked very well for him. He will need to be referred to pain management and he is aware of this. I will not prescribe tramadol for him long term. He continues to smoke which contributes to circulatory issues and pain control.   Review of Systems  Constitutional: Negative.  Negative for chills, fever, malaise/fatigue and weight loss.  Respiratory: Negative.  Negative for cough, sputum production and shortness  of breath.   Cardiovascular: Negative.  Negative for chest pain, orthopnea and leg swelling.  Gastrointestinal: Positive for abdominal pain and heartburn. Negative for nausea and vomiting.  Musculoskeletal: Positive for joint pain and myalgias.       See hpi  Neurological: Negative for dizziness.  Psychiatric/Behavioral: Positive for substance abuse. Negative for suicidal ideas. The patient has insomnia.     Past Medical History:  Diagnosis Date  . Hepatitis C antibody positive in blood   . High blood pressure     Past Surgical History:  Procedure Laterality Date  . arm surgery    . KNEE SURGERY      Family History  Problem Relation Age of Onset  . Colitis Mother   . Arthritis Mother   . Cancer Father        lymphoma?  . Cancer Paternal Grandfather        bone  . Heart disease Neg Hx   . Stroke Neg Hx   . Diabetes Neg Hx     Social History Reviewed with no changes to be made today.   Outpatient Medications Prior to Visit  Medication Sig Dispense Refill  . albuterol (PROAIR HFA) 108 (90 Base) MCG/ACT inhaler Inhale 1 puff every 6 (six) hours as needed into the lungs for wheezing or shortness of breath. 18 g 0  . albuterol (PROVENTIL) (2.5 MG/3ML) 0.083% nebulizer solution Take 3 mLs (2.5 mg total)  by nebulization every 6 (six) hours as needed for wheezing or shortness of breath. 75 mL 12  . carvedilol (COREG) 3.125 MG tablet Take 1 tablet (3.125 mg total) by mouth 2 (two) times daily. 180 tablet 1  . chlorthalidone (HYGROTON) 25 MG tablet Take 1 tablet (25 mg total) 1 day or 1 dose by mouth. 90 tablet 0  . fluticasone furoate-vilanterol (BREO ELLIPTA) 200-25 MCG/INH AEPB Inhale 1 puff into the lungs daily. 1 each 6  . Multiple Vitamin (MULTI-VITAMINS) TABS Take 1 tablet by mouth 1 day or 1 dose.    . potassium chloride (KLOR-CON 10) 10 MEQ tablet Take 1 tablet (10 mEq total) daily by mouth. 90 tablet 0  . Sofosbuvir-Velpatasvir (EPCLUSA) 400-100 MG TABS Take 1 tablet by  mouth daily. 28 tablet 2  . hydrochlorothiazide (HYDRODIURIL) 12.5 MG tablet Take 1 tablet by mouth 1 day or 1 dose.    . Thiamine 50 MG CAPS Take 1 tablet by mouth 1 day or 1 dose.    . traMADol (ULTRAM) 50 MG tablet Take 1 tablet (50 mg total) by mouth every 8 (eight) hours as needed. for pain (Patient not taking: Reported on 11/20/2017) 20 tablet 0   No facility-administered medications prior to visit.     Allergies  Allergen Reactions  . Naproxen Hives and Swelling       Objective:    BP 116/75 (BP Location: Right Arm, Patient Position: Sitting, Cuff Size: Normal)   Pulse 79   Temp 98.5 F (36.9 C) (Oral)   Ht 5\' 10"  (1.778 m)   Wt 234 lb (106.1 kg)   SpO2 95%   BMI 33.58 kg/m  Wt Readings from Last 3 Encounters:  11/21/17 235 lb 6 oz (106.8 kg)  11/21/17 237 lb (107.5 kg)  11/20/17 234 lb (106.1 kg)    Physical Exam  Constitutional: He is oriented to person, place, and time. He appears well-developed and well-nourished.  HENT:  Head: Normocephalic.  Eyes: EOM are normal.  Neck: Normal range of motion.  Cardiovascular: Normal rate, regular rhythm and normal heart sounds.  Pulmonary/Chest: Effort normal. He has wheezes.  Abdominal: He exhibits distension.  Neurological: He is alert and oriented to person, place, and time.  Skin: Skin is warm and dry.  Psychiatric: He has a normal mood and affect. His behavior is normal. Judgment and thought content normal.       Patient has been counseled extensively about nutrition and exercise as well as the importance of adherence with medications and regular follow-up. The patient was given clear instructions to go to ER or return to medical center if symptoms don't improve, worsen or new problems develop. The patient verbalized understanding.   Follow-up: Return if symptoms worsen or fail to improve.   Gildardo Pounds, FNP-BC [facility] [place], [place]   11/22/2017, 11:50  PM

## 2017-11-20 NOTE — Telephone Encounter (Signed)
Placed call to [place]. Patient will need CPAP through patient assistance program

## 2017-11-20 NOTE — Telephone Encounter (Signed)
Met with the patient at the request of Geryl Rankins, NP to discuss ordering a CPAP machine.  The patient stated that he is currently unemployed and is not receiving disability and can't afford to pay for a CPAP.  Explained to him that [facility] can obtain a refurbished CPAP machine from the [employer] and will make the $100 donation for the machine. Explained to the patient that after the machine is received and that may take about a month after it is ordered, the RT from [facility] will meet with him to educate him regarding the use, care/maintenance of the machine.  The patient was very appreciative of the assistance. The patient stated that he was not given a CPAP mask.  Call placed to [facility], spoke to [place] who stated that they received the order and settings from Dr Glade Lloyd but did not do anything with the patient.   Call placed to [facility] to inquire about the recommended mask for the patient as it appears that an in home sleep study was done. Voicemail message left requesting a call back to # [phone].  Update provided to Geryl Rankins, NP.

## 2017-11-21 ENCOUNTER — Other Ambulatory Visit (INDEPENDENT_AMBULATORY_CARE_PROVIDER_SITE_OTHER): Payer: Self-pay

## 2017-11-21 ENCOUNTER — Encounter: Payer: Self-pay | Admitting: Physician Assistant

## 2017-11-21 ENCOUNTER — Ambulatory Visit: Payer: Self-pay | Admitting: Surgery

## 2017-11-21 ENCOUNTER — Ambulatory Visit (INDEPENDENT_AMBULATORY_CARE_PROVIDER_SITE_OTHER): Payer: Self-pay | Admitting: Physician Assistant

## 2017-11-21 ENCOUNTER — Ambulatory Visit (INDEPENDENT_AMBULATORY_CARE_PROVIDER_SITE_OTHER): Payer: Self-pay | Admitting: Surgery

## 2017-11-21 ENCOUNTER — Encounter: Payer: Self-pay | Admitting: Surgery

## 2017-11-21 ENCOUNTER — Other Ambulatory Visit: Payer: Self-pay

## 2017-11-21 VITALS — BP 114/72 | HR 68 | Ht 69.5 in | Wt 235.4 lb

## 2017-11-21 VITALS — BP 142/56 | HR 85 | Temp 98.2°F | Ht 70.0 in | Wt 237.0 lb

## 2017-11-21 DIAGNOSIS — K7460 Unspecified cirrhosis of liver: Secondary | ICD-10-CM

## 2017-11-21 DIAGNOSIS — Z23 Encounter for immunization: Secondary | ICD-10-CM

## 2017-11-21 DIAGNOSIS — B1920 Unspecified viral hepatitis C without hepatic coma: Secondary | ICD-10-CM

## 2017-11-21 DIAGNOSIS — K439 Ventral hernia without obstruction or gangrene: Secondary | ICD-10-CM

## 2017-11-21 LAB — HEPATIC FUNCTION PANEL
ALK PHOS: 69 U/L (ref 39–117)
ALT: 39 U/L (ref 0–53)
AST: 49 U/L — AB (ref 0–37)
Albumin: 3.5 g/dL (ref 3.5–5.2)
BILIRUBIN TOTAL: 0.5 mg/dL (ref 0.2–1.2)
Bilirubin, Direct: 0.1 mg/dL (ref 0.0–0.3)
Total Protein: 8.6 g/dL — ABNORMAL HIGH (ref 6.0–8.3)

## 2017-11-21 LAB — BASIC METABOLIC PANEL
BUN / CREAT RATIO: 9 (ref 9–20)
BUN: 7 mg/dL (ref 6–24)
CO2: 29 mmol/L (ref 20–29)
CREATININE: 0.78 mg/dL (ref 0.76–1.27)
Calcium: 9.4 mg/dL (ref 8.7–10.2)
Chloride: 95 mmol/L — ABNORMAL LOW (ref 96–106)
GFR calc Af Amer: 126 mL/min/{1.73_m2} (ref >59)
GFR calc non Af Amer: 109 mL/min/{1.73_m2} (ref >59)
GLUCOSE: 93 mg/dL (ref 65–99)
Potassium: 3.4 mmol/L — ABNORMAL LOW (ref 3.5–5.2)
Sodium: 137 mmol/L (ref 134–144)

## 2017-11-21 LAB — HEPATITIS C RNA QUANTITATIVE
HCV Quantitative Log: <1.18 Log IU/mL
HCV RNA, PCR, QN: <15 IU/mL

## 2017-11-21 MED ORDER — HEPATITIS A-HEP B RECOMB VAC 720-20 ELU-MCG/ML IM SUSP: 1.0000 mL | Freq: Once | INTRAMUSCULAR | 0 refills | Status: AC

## 2017-11-21 NOTE — Patient Instructions (Addendum)
Please go to the basement level to have your labs drawn.  We have given  You the first Twinrex injection. For the #2 twinrex injection you will come on 12-23-2017 at 1:30 PM.  You have been scheduled for an endoscopy. Please follow written instructions given to you at your visit today. If you use inhalers (even only as needed), please bring them with you on the day of your procedure. Your physician has requested that you go to www.startemmi.com and enter the access code given to you at your visit today. This web site gives a general overview about your procedure. However, you should still follow specific instructions given to you by our office regarding your preparation for the procedure.

## 2017-11-21 NOTE — Progress Notes (Signed)
Subjective:    Patient ID: Levi Alvarez, male    DOB: 26-Mar-1971, 47 y.o.   MRN: [id number]  HPI Tuggle is a 47 year old white male, new to GI today referred by Dr. Scharlene Gloss / infectious disease for evaluation of new diagnosis of cirrhosis. Patient has history of hypertension, sleep apnea and obesity. He had routine labs done this past showing elevation of his liver enzymes and was subsequently diagnosed with hepatitis C. He recently established with the infectious disease clinic and started on Epclusa within the past month. He is tolerating treatment well. He had also been drinking about 18 beers per day over the past several years. He says he has stopped drinking alcohol over the past 6 weeks. He has no prior history of any known liver issues. Family history is negative for liver disease Upper abdominal ultrasound done September 2018 showed no gallstones, no ductal dilation, no ascites he does have nodularity and slightly coarsened echogenicity of the liver worrisome for early cirrhosis no liver lesion noted. Most recent labs show INR of 1.1, hepatitis A and B serologies were done and both negative Hepatic panel in August 2018 showed T bili of 0.5 alkaline phosphatase 96 AST 284, ALT 164 and albumin of 4. Hemoglobin 14, MCV of 101 and platelets of 143. Hepatitis C RNA Quant was 6,100,000, this was repeated last week and already undetectable. Patient has no specific GI complaints. He verbalized that he did not understand that he would need to stop drinking alcohol altogether.  Review of Systems Pertinent positive and negative review of systems were noted in the above HPI section.  All other review of systems was otherwise negative.  Outpatient Encounter Medications as of 11/21/2017  Medication Sig  . albuterol (PROAIR HFA) 108 (90 Base) MCG/ACT inhaler Inhale 1 puff every 6 (six) hours as needed into the lungs for wheezing or shortness of breath.  Marland Kitchen albuterol (PROVENTIL) (2.5 MG/3ML) 0.083%  nebulizer solution Take 3 mLs (2.5 mg total) by nebulization every 6 (six) hours as needed for wheezing or shortness of breath.  Marland Kitchen BREO ELLIPTA 200-25 MCG/INH AEPB INHALE 1 PUFF INTO THE LUNGS DAILY  . carvedilol (COREG) 3.125 MG tablet Take 1 tablet (3.125 mg total) by mouth 2 (two) times daily.  . chlorthalidone (HYGROTON) 25 MG tablet Take 1 tablet (25 mg total) 1 day or 1 dose by mouth.  . fluticasone furoate-vilanterol (BREO ELLIPTA) 200-25 MCG/INH AEPB Inhale 1 puff into the lungs daily.  Marland Kitchen LOPERAMIDE A-D 2 MG tablet TAKE 1 CAPSULE BY MOUTH 4 TIMES DAILY AS NEEDED FOR UP TO 10 DAYS FOR DIARRHEA OR LOOSE STOOLS  . Multiple Vitamin (MULTI-VITAMINS) TABS Take 1 tablet by mouth 1 day or 1 dose.  . potassium chloride (KLOR-CON 10) 10 MEQ tablet Take 1 tablet (10 mEq total) daily by mouth.  . potassium chloride (MICRO-K) 10 MEQ CR capsule TAKE 1 CAPSULE DAILY BY MOUTH  . Sofosbuvir-Velpatasvir (EPCLUSA) 400-100 MG TABS Take 1 tablet by mouth daily.  . traMADol (ULTRAM) 50 MG tablet Take 1 tablet (50 mg total) by mouth every 8 (eight) hours as needed. for pain  . hepatitis A-hepatitis B (TWINRIX) 720-20 ELU-MCG/ML injection Inject 1 mL into the muscle once for 1 dose.  . [DISCONTINUED] Thiamine 50 MG CAPS Take 1 tablet by mouth 1 day or 1 dose.   No facility-administered encounter medications on file as of 11/21/2017.    Allergies  Allergen Reactions  . Naproxen Hives and Swelling   Patient Active Problem  List   Diagnosis Date Noted  . Chronic hepatitis C without hepatic coma ([place]) 10/08/2017  . Cirrhosis ([place]) 10/08/2017  . Chronic pain of left knee 07/23/2017  . Essential hypertension 07/12/2017  . OSA (obstructive sleep apnea) 07/12/2017  . Class 2 obesity with serious comorbidity and body mass index (BMI) of 35.0 to 35.9 in adult 07/12/2017  . Elevated liver enzymes 07/12/2017  . Other chronic pain 07/12/2017  . Smoker 07/12/2017  . Hyperlipidemia 07/12/2017  . Polyarthralgia  07/12/2017  . Snoring 06/05/2017   Social History   Socioeconomic History  . Marital status: Single    Spouse name: Not on file  . Number of children: Not on file  . Years of education: Not on file  . Highest education level: Not on file  Social Needs  . Financial resource strain: Not on file  . Food insecurity - worry: Not on file  . Food insecurity - inability: Not on file  . Transportation needs - medical: Not on file  . Transportation needs - non-medical: Not on file  Occupational History  . Not on file  Tobacco Use  . Smoking status: Current Every Day Smoker    Packs/day: 1.00    Types: Cigarettes  . Smokeless tobacco: Former Systems developer    Types: Chew  Substance and Sexual Activity  . Alcohol use: No    Alcohol/week: 10.8 oz    Types: 18 Cans of beer per week    Frequency: Never    Comment: daily  . Drug use: No  . Sexual activity: Yes    Partners: Female  Other Topics Concern  . Not on file  Social History Narrative  . Not on file    Mr. Poffenberger family history includes Arthritis in his mother; Cancer in his father and paternal grandfather; Colitis in his mother.      Objective:    Vitals:   11/21/17 1335  BP: 114/72  Pulse: 68    Physical Exam; well-developed white male in no acute distress, accompanied by his girlfriend, blood pressure 114/72, pulse 68, height 5 foot 9, weight 235, BMI 34.2. HEENT; nontraumatic normocephalic EOMI PERRLA sclera anicteric, Cardiovascular ;regular rate and rhythm with S1-S2 no murmur or gallop, Pulmonary; clear bilaterally, Abdomen; soft, nondistended bowel sounds are active is nontender there is no palpable mass or hepatosplenomegaly, no fluid wave, Rectal ;exam not done, Extremities ;no clubbing cyanosis or edema skin warm and dry, Neuropsych ;mood and affect appropriate       Assessment & Plan:   #22 47 year old white male with new diagnosis of cirrhosis secondary to hepatitis C and EtOH. He appears to be compensated at this  time with a MELD of 7. He is currently on a 12 week course of Epclusa #2 hypertension #3 obstructive sleep apnea #4 obesity  Plan; discussion was had with the patient today regarding diagnosis of cirrhosis, and the synergistic effect of hepatitis C and alcohol. We briefly discussed complications from cirrhosis, eventually leading to liver failure. We discussed indication for monitoring of cirrhosis with serial labs, ultrasounds. Patient was advised to discontinue all alcohol use which he has done over the past several weeks and was told that this would need to be a long-term plan as well.  Will check AFP level and repeat hepatic panel today Patient will be scheduled for upper endoscopy with Dr. Havery Moros for variceal screening. Procedure was discussed in detail with patient including indications risks and benefits and he is agreeable to proceed. Patient needs to be  vaccinated for hepatitis A and B, we will initiate Twinrix vaccine today Patient will need ongoing follow-up with GI.   Aodhan Scheidt S Jamari Moten PA-C 11/21/2017   Cc: Thayer Headings, MD

## 2017-11-21 NOTE — Patient Instructions (Addendum)
We have scheduled you for a CT Scan of your Abdomen and Pelvis. This has been scheduled at 1/23 at our [facility] location. Please Check-in at 8:30, 15 minutes prior to your scheduled appointment. If you need to reschedule your Scan, you may do so by calling [phone].  You will need to pick up a prep kit at least 24 hours in advance of your Scan: You may pick this up at the [facility] department at [facility], or [employer].  Bring a list of medications with you to your appointment and you may have nothing to eat or drink 4 hours prior to your CT Scan.    We have seen you today in our office for your Ventral (Abdominal) Hernia. We recommend that you begin using the abdominal binder given to you today for comfort.  If the Hernia comes out, lie down and push the protruding part back in if possible. It is much easier to do this while lying down.  Please review the instructions below. If you develop any signs or symptoms of a strangulated hernia, please go to the Emergency Room Immediately. These symptoms are highlighted below under, "GET HELP RIGHT AWAY IF:"  If you have any questions or concerns, please call our office and speak with a nurse.    Ventral Hernia A ventral hernia is a bulge of tissue from inside the abdomen that pushes through a weak area of the muscles that form the front wall of the abdomen. The tissues inside the abdomen are inside a sac (peritoneum). These tissues include the small intestine, large intestine, and the fatty tissue that covers the intestines (omentum). Sometimes, the bulge that forms a hernia contains intestines. Other hernias contain only fat. Ventral hernias do not go away without surgical treatment. There are several types of ventral hernias. You may have:  A hernia at an incision site from previous abdominal surgery (incisional hernia).  A hernia just above the belly button (epigastric hernia), or at the belly button  (umbilical hernia). These types of hernias can develop from heavy lifting or straining.  A hernia that comes and goes (reducible hernia). It may be visible only when you lift or strain. This type of hernia can be pushed back into the abdomen (reduced).  A hernia that traps abdominal tissue inside the hernia (incarcerated hernia). This type of hernia does not reduce.  A hernia that cuts off blood flow to the tissues inside the hernia (strangulated hernia). The tissues can start to die if this happens. This is a very painful bulge that cannot be reduced. A strangulated hernia is a medical emergency.  What are the causes? This condition is caused by abdominal tissue putting pressure on an area of weakness in the abdominal muscles. What increases the risk? The following factors may make you more likely to develop this condition:  Being male.  Being 85 or older.  Being overweight or obese.  Having had previous abdominal surgery, especially if there was an infection after surgery.  Having had an injury to the abdominal wall.  Having had several pregnancies.  Having a buildup of fluid inside the abdomen (ascites).  What are the signs or symptoms? The only symptom of a ventral hernia may be a painless bulge in the abdomen. A reducible hernia may be visible only when you strain, cough, or lift. Other symptoms may include:  Dull pain.  A feeling of pressure.  Signs and symptoms of a strangulated hernia may include:  Increasing pain.  Nausea and vomiting.  Pain when pressing on the hernia.  The skin over the hernia turning red or purple.  Constipation.  Blood in the stool (feces).  How is this diagnosed? This condition may be diagnosed based on:  Your symptoms.  Your medical history.  A physical exam. You may be asked to cough or strain while standing. These actions increase the pressure inside your abdomen and force the hernia through the opening in your muscles. Your  health care provider may try to reduce the hernia by pressing on it.  Imaging studies, such as an ultrasound or CT scan.  How is this treated? This condition is treated with surgery. If you have a strangulated hernia, surgery is done as soon as possible. If your hernia is small and not incarcerated, you may be asked to lose some weight before surgery. Follow these instructions at home:  Follow instructions from your health care provider about eating or drinking restrictions.  If you are overweight, your health care provider may recommend that you increase your activity level and eat a healthier diet.  Do not lift anything that is heavier than 10 lb (4.5 kg).  Return to your normal activities as told by your health care provider. Ask your health care provider what activities are safe for you. You may need to avoid activities that increase pressure on your hernia.  Take over-the-counter and prescription medicines only as told by your health care provider.  Keep all follow-up visits as told by your health care provider. This is important. Contact a health care provider if:  Your hernia gets larger.  Your hernia becomes painful. Get help right away if:  Your hernia becomes increasingly painful.  You have pain along with any of the following: ? Changes in skin color in the area of the hernia. ? Nausea. ? Vomiting. ? Fever. Summary  A ventral hernia is a bulge of tissue from inside the abdomen that pushes through a weak area of the muscles that form the front wall of the abdomen.  This condition is treated with surgery, which may be urgent depending on your hernia.  Do not lift anything that is heavier than 10 lb (4.5 kg), and follow activity instructions from your health care provider. This information is not intended to replace advice given to you by your health care provider. Make sure you discuss any questions you have with your health care provider. Document Released:  10/15/2012 Document Revised: 06/15/2016 Document Reviewed: 06/15/2016 Elsevier Interactive Patient Education  [employer].

## 2017-11-21 NOTE — Progress Notes (Signed)
11/21/2017  Reason for Visit:  Ventral hernia  Referring Provider:  Geryl Rankins, NP  History of Present Illness: Levi Alvarez is a 47 y.o. male who presents for evaluation of abdominal ventral hernia.  Patient reports that he saw his PCP about two months ago and noted that he had a hernia.  He reports that at that time he was also having issues with gastroenteritis with nausea and abdominal pain.  Since then he's been doing better.  He reports that he has two areas of bulging on the sides, at about the midclavicular lines bilaterally, as well as a larger area of bulging in the midline and possibly a small one at the umbilicus.  Other than during the episode of gastroenteritis, his pain or symptoms have been minimal with only some soreness in the abdomen.  Denies any constipation but did see blood in the stool about two months ago, unclear etiology.  Denies any fevers, chest pain, shortness of breath, current nausea or vomiting.  Of note, he has COPD, OSA and is getting a CPAP machine soon, and smokes 1/2 pack per day and has cut down from 1 pack per day recently.  Past Medical History: Past Medical History:  Diagnosis Date  . Hepatitis C antibody positive in blood      Past Surgical History: Past Surgical History:  Procedure Laterality Date  . arm surgery    . KNEE SURGERY      Home Medications: Prior to Admission medications   Medication Sig Start Date End Date Taking? Authorizing Provider  albuterol (PROAIR HFA) 108 (90 Base) MCG/ACT inhaler Inhale 1 puff every 6 (six) hours as needed into the lungs for wheezing or shortness of breath. 09/20/17   Gildardo Pounds, NP  albuterol (PROVENTIL) (2.5 MG/3ML) 0.083% nebulizer solution Take 3 mLs (2.5 mg total) by nebulization every 6 (six) hours as needed for wheezing or shortness of breath. 11/01/17   Gildardo Pounds, NP  BREO ELLIPTA 200-25 MCG/INH AEPB INHALE 1 PUFF INTO THE LUNGS DAILY 11/01/17   [provider]   carvedilol (COREG) 3.125 MG tablet Take 1 tablet (3.125 mg total) by mouth 2 (two) times daily. 08/30/17 08/30/18  Tysinger, Camelia Eng, PA-C  chlorthalidone (HYGROTON) 25 MG tablet Take 1 tablet (25 mg total) 1 day or 1 dose by mouth. 09/20/17 12/19/17  Gildardo Pounds, NP  fluticasone furoate-vilanterol (BREO ELLIPTA) 200-25 MCG/INH AEPB Inhale 1 puff into the lungs daily. 11/01/17   Gildardo Pounds, NP  LOPERAMIDE A-D 2 MG tablet TAKE 1 CAPSULE BY MOUTH 4 TIMES DAILY AS NEEDED FOR UP TO 10 DAYS FOR DIARRHEA OR LOOSE STOOLS 11/01/17   [provider]  Multiple Vitamin (MULTI-VITAMINS) TABS Take 1 tablet by mouth 1 day or 1 dose. 04/04/15   [provider]  potassium chloride (KLOR-CON 10) 10 MEQ tablet Take 1 tablet (10 mEq total) daily by mouth. 09/20/17 12/19/17  Gildardo Pounds, NP  potassium chloride (MICRO-K) 10 MEQ CR capsule TAKE 1 CAPSULE DAILY BY MOUTH 11/14/17   [provider]  Sofosbuvir-Velpatasvir (EPCLUSA) 400-100 MG TABS Take 1 tablet by mouth daily. 10/08/17   ComerOkey Regal, MD  traMADol (ULTRAM) 50 MG tablet Take 1 tablet (50 mg total) by mouth every 8 (eight) hours as needed. for pain 11/20/17 12/20/17  Gildardo Pounds, NP    Allergies: Allergies  Allergen Reactions  . Naproxen Hives and Swelling    Social History:  reports that he has been smoking cigarettes.  He has been smoking about 1.00 pack per day. He has quit using smokeless tobacco. His smokeless tobacco use included chew. He reports that he does not drink alcohol or use drugs.   Family History: Family History  Problem Relation Age of Onset  . Colitis Mother   . Arthritis Mother   . Cancer Father        lymphoma?  . Cancer Paternal Grandfather        bone  . Heart disease Neg Hx   . Stroke Neg Hx   . Diabetes Neg Hx     Review of Systems: Review of Systems  Constitutional: Negative for chills and fever.  Respiratory: Negative for shortness of breath.   Cardiovascular: Negative for  chest pain.  Gastrointestinal: Positive for abdominal pain and blood in stool. Negative for constipation, nausea and vomiting.  Genitourinary: Positive for urgency.  Musculoskeletal: Negative for myalgias.  Skin: Negative for rash.  Neurological: Negative for dizziness.  Psychiatric/Behavioral: Positive for depression.  All other systems reviewed and are negative.   Physical Exam BP (!) 142/56   Pulse 85   Temp 98.2 F (36.8 C) (Oral)   Ht 5\' 10"  (1.778 m)   Wt 107.5 kg (237 lb)   BMI 34.01 kg/m  CONSTITUTIONAL: No acute distress HEENT:  Normocephalic, atraumatic, extraocular motion intact. NECK: Trachea is midline, and there is no jugular venous distension.  RESPIRATORY:  Lungs are clear, and breath sounds are equal bilaterally. Normal respiratory effort without pathologic use of accessory muscles. CARDIOVASCULAR: Heart is regular without murmurs, gallops, or rubs. GI: The abdomen is soft, obese, nondistended, currently non-tender to palpation.  On exam, he has diastasis recti in the midline, but otherwise no truly palpable hernia defects. There were no palpable masses.  MUSCULOSKELETAL:  Normal muscle strength and tone in all four extremities.  No peripheral edema or cyanosis. SKIN: Skin turgor is normal. There are no pathologic skin lesions.  NEUROLOGIC:  Motor and sensation is grossly normal.  Cranial nerves are grossly intact. PSYCH:  Alert and oriented to person, place and time. Affect is normal.  Laboratory Analysis: Results for orders placed or performed in visit on 11/20/17 (from the past 24 hour(s))  Basic metabolic panel     Status: Abnormal   Collection Time: 11/20/17  3:28 PM  Result Value Ref Range   Glucose 93 65 - 99 mg/dL   BUN 7 6 - 24 mg/dL   Creatinine, Ser 0.78 0.76 - 1.27 mg/dL   GFR calc non Af Amer 109 >59 mL/min/1.73   GFR calc Af Amer 126 >59 mL/min/1.73   BUN/Creatinine Ratio 9 9 - 20   Sodium 137 134 - 144 mmol/L   Potassium 3.4 (L) 3.5 - 5.2  mmol/L   Chloride 95 (L) 96 - 106 mmol/L   CO2 29 20 - 29 mmol/L   Calcium 9.4 8.7 - 10.2 mg/dL   Narrative   Performed at:  [address] [place], [place]  [id number] Lab Director: Rush Farmer MD, Phone:  [phone]    Imaging: No results found.  Assessment and Plan: This is a 47 y.o. male who presents for evaluation for possible ventral hernias.  On exam, the patient has diastasis recti but otherwise no clear hernia defects that are palpable.  Discussed with patient that diastasis recti is not truly a hernia but rather laxity of the fascia between the rectus muscles in the midline.  Given his history and description of his other areas of  concern, recommended that we proceed with a CT scan of abdomen and pelvis to fully evaluate his abdominal wall and determine if there are any hernia defects present.  If none, then patient would be reassured and no surgery is needed.  However if there are any hernias present, then the discussion would be whether he wants to pursue surgery repair or not.  If he does, given his obesity and smoking, have recommended that he continue trying to quit smoking and lose weight, as both would tremendously help with his health as well as operative success.  Patient understands this plan and all of his questions have been answered.  Face-to-face time spent with the patient and care providers was 60 minutes, with more than 50% of the time spent counseling, educating, and coordinating care of the patient.     Melvyn Neth, [place]

## 2017-11-22 ENCOUNTER — Telehealth: Payer: Self-pay | Admitting: Physician Assistant

## 2017-11-22 ENCOUNTER — Telehealth: Payer: Self-pay | Admitting: Nurse Practitioner

## 2017-11-22 ENCOUNTER — Encounter: Payer: Self-pay | Admitting: Nurse Practitioner

## 2017-11-22 LAB — AFP TUMOR MARKER: AFP TUMOR MARKER: 2.9 ng/mL (ref <6.1)

## 2017-11-22 MED ORDER — POTASSIUM CHLORIDE ER 10 MEQ PO TBCR: 10.0000 meq | EXTENDED_RELEASE_TABLET | Freq: Every day | ORAL | 0 refills | Status: DC

## 2017-11-22 NOTE — Telephone Encounter (Signed)
Call placed to [facility] sleep center [phone] to inform them about patient's case and get help for patient's mask type and size. Spoke with Junious Silk and explained to her that patient had an in-home sleep study but there are no notes about the mask type or size patient would need to be able to order him a CPAP machine. Junious Silk recommended that a referral be placed to have patient get an in lab study where they will determine the pressure and other information. Junious Silk also confirmed that they do see patients without insurance.   Will forward this inform to provider.

## 2017-11-22 NOTE — Progress Notes (Signed)
Agree with assessment and plan as outlined.  Suspected cirrhosis based on US imaging however platelets are normal. If he has cirrhosis it would seem compensated. Almost certainly his liver disease is due to hepatitis C and alcohol, however given his young age with cirrhosis, he should have additional serologic workup done to exclude other etiologies for chronic liver disease if you can help get those ordered. Will await EGD results.

## 2017-11-25 ENCOUNTER — Other Ambulatory Visit: Payer: Self-pay

## 2017-11-25 ENCOUNTER — Encounter: Payer: Self-pay | Admitting: Nurse Practitioner

## 2017-11-25 DIAGNOSIS — R748 Abnormal levels of other serum enzymes: Secondary | ICD-10-CM

## 2017-11-25 MED FILL — POTASSIUM CL 10 MEQ TAB SA: 10 | 30 days supply | Qty: 30 | Fill #0

## 2017-11-25 NOTE — Telephone Encounter (Signed)
Refill request through [employer] [facility]

## 2017-11-26 NOTE — Telephone Encounter (Signed)
I called and asked the patient about the message we got from [place]. They advised they cannot give the Hep Scripts at their pharmacy.  I asked the patient what they were referring to? He said he get a medication through the mail and that is what they meant.  I asked if there is anything he needs from [place] and he said no.  He thanked me for checking on this .

## 2017-11-27 ENCOUNTER — Other Ambulatory Visit: Payer: Self-pay

## 2017-11-27 ENCOUNTER — Other Ambulatory Visit: Payer: Self-pay | Admitting: Nurse Practitioner

## 2017-11-27 DIAGNOSIS — R748 Abnormal levels of other serum enzymes: Secondary | ICD-10-CM

## 2017-11-27 DIAGNOSIS — G4733 Obstructive sleep apnea (adult) (pediatric): Secondary | ICD-10-CM

## 2017-11-27 NOTE — Telephone Encounter (Signed)
Order has been placed. Thank you 

## 2017-11-29 ENCOUNTER — Telehealth: Payer: Self-pay | Admitting: Nurse Practitioner

## 2017-11-29 MED FILL — !BREO ELLIPTA 200-25 MCG: 200-25 | 30 days supply | Qty: 60 | Fill #1

## 2017-11-29 NOTE — Telephone Encounter (Signed)
Pt' girlfriend called to request a refill on  -carvedilol (COREG) 3.125 MG tablet  Please follow up

## 2017-11-30 LAB — MITOCHONDRIAL ANTIBODIES: Mitochondrial M2 Ab, IgG: <=20 U

## 2017-11-30 LAB — ANTI-SMOOTH MUSCLE ANTIBODY, IGG: Actin (Smooth Muscle) Antibody (IGG): <20 U (ref <20)

## 2017-11-30 LAB — ANA: ANA: NEGATIVE

## 2017-12-01 ENCOUNTER — Other Ambulatory Visit: Payer: Self-pay | Admitting: Nurse Practitioner

## 2017-12-01 DIAGNOSIS — I159 Secondary hypertension, unspecified: Secondary | ICD-10-CM

## 2017-12-01 MED ORDER — CARVEDILOL 3.125 MG PO TABS: 3.1250 mg | ORAL_TABLET | Freq: Two times a day (BID) | ORAL | 2 refills | Status: DC

## 2017-12-02 MED FILL — ?ALBUTEROL SUL 2.5 MG/3 MLS: (2.5 MG/3ML | 7 days supply | Qty: 90 | Fill #1

## 2017-12-02 MED FILL — ?CARVEDILOL 3.125 MG TABLET: 3.125 | 30 days supply | Qty: 60 | Fill #0

## 2017-12-02 NOTE — Telephone Encounter (Signed)
Script has been sent. Also patient always needs to let us know what pharmacy to send medications to.

## 2017-12-04 ENCOUNTER — Ambulatory Visit: Admission: RE | Admit: 2017-12-04 | Payer: Self-pay | Source: Ambulatory Visit

## 2017-12-09 ENCOUNTER — Ambulatory Visit
Admission: RE | Admit: 2017-12-09 | Discharge: 2017-12-09 | Disposition: A | Discharge status: Home / Self Care | Payer: Self-pay | Source: Ambulatory Visit | Attending: Surgery | Admitting: Surgery

## 2017-12-09 DIAGNOSIS — K4090 Unilateral inguinal hernia, without obstruction or gangrene, not specified as recurrent: Secondary | ICD-10-CM | POA: Insufficient documentation

## 2017-12-09 DIAGNOSIS — K429 Umbilical hernia without obstruction or gangrene: Secondary | ICD-10-CM | POA: Insufficient documentation

## 2017-12-09 DIAGNOSIS — I700 Atherosclerosis of aorta: Secondary | ICD-10-CM | POA: Insufficient documentation

## 2017-12-09 DIAGNOSIS — R918 Other nonspecific abnormal finding of lung field: Secondary | ICD-10-CM | POA: Insufficient documentation

## 2017-12-09 DIAGNOSIS — K439 Ventral hernia without obstruction or gangrene: Secondary | ICD-10-CM

## 2017-12-09 MED ORDER — IOPAMIDOL (ISOVUE-300) INJECTION 61%
100.0000 mL | Freq: Once | INTRAVENOUS | Status: AC | PRN
  Administered 2017-12-09: 100 mL via INTRAVENOUS

## 2017-12-12 ENCOUNTER — Telehealth: Payer: Self-pay

## 2017-12-12 NOTE — Telephone Encounter (Signed)
Mychart concern

## 2017-12-12 NOTE — Telephone Encounter (Signed)
Levi Alvarez, patient's significant other called and stated that Dr. Hampton Abbot had called them yesterday and they were not able to pick up the line when he called them. She wanted to know the results and I gave it to her. She wanted to speak with Dr. Hampton Abbot and I told her that I would relate the message. Mrs. Levi Alvarez number is # [phone].

## 2017-12-13 ENCOUNTER — Ambulatory Visit (HOSPITAL_BASED_OUTPATIENT_CLINIC_OR_DEPARTMENT_OTHER): Payer: Self-pay

## 2017-12-17 NOTE — Telephone Encounter (Signed)
Dr. Hampton Abbot called patient.

## 2017-12-18 ENCOUNTER — Other Ambulatory Visit: Payer: Self-pay | Admitting: Pharmacist

## 2017-12-18 DIAGNOSIS — I10 Essential (primary) hypertension: Secondary | ICD-10-CM

## 2017-12-18 MED ORDER — CHLORTHALIDONE 25 MG PO TABS: 25.0000 mg | ORAL_TABLET | Freq: Every day | ORAL | 0 refills | Status: DC

## 2017-12-18 MED FILL — ?CHLORTHALIDONE 25 MG TABLE: 25 | 30 days supply | Qty: 30 | Fill #0

## 2017-12-24 ENCOUNTER — Ambulatory Visit (INDEPENDENT_AMBULATORY_CARE_PROVIDER_SITE_OTHER): Payer: Self-pay | Admitting: Physician Assistant

## 2017-12-24 DIAGNOSIS — Z23 Encounter for immunization: Secondary | ICD-10-CM

## 2017-12-25 ENCOUNTER — Telehealth: Payer: Self-pay | Admitting: Physician Assistant

## 2017-12-25 NOTE — Telephone Encounter (Signed)
Advised 6 months from first injection is the schedule per the CDC. Appointment set for him on 05/26/18 at 4 pm.

## 2017-12-25 NOTE — Telephone Encounter (Signed)
Patient friend [name] would like to know when pt needs to come in for next Hep injection. Last one was yesterday 2.12.19.

## 2017-12-26 MED FILL — POTASSIUM CL 10 MEQ TAB SA: 10 | 30 days supply | Qty: 30 | Fill #1

## 2017-12-27 ENCOUNTER — Encounter (HOSPITAL_BASED_OUTPATIENT_CLINIC_OR_DEPARTMENT_OTHER): Payer: Self-pay

## 2017-12-27 DIAGNOSIS — I10 Essential (primary) hypertension: Secondary | ICD-10-CM

## 2017-12-30 MED FILL — ?CARVEDILOL 3.125 MG TABLET: 3.125 | 30 days supply | Qty: 60 | Fill #1

## 2018-01-06 ENCOUNTER — Other Ambulatory Visit: Payer: Self-pay

## 2018-01-06 ENCOUNTER — Ambulatory Visit (AMBULATORY_SURGERY_CENTER): Payer: Self-pay | Admitting: Gastroenterology

## 2018-01-06 ENCOUNTER — Encounter: Payer: Self-pay | Admitting: Gastroenterology

## 2018-01-06 VITALS — BP 124/93 | HR 84 | Temp 99.5°F | Resp 18 | Ht 69.0 in | Wt 235.0 lb

## 2018-01-06 DIAGNOSIS — K7460 Unspecified cirrhosis of liver: Secondary | ICD-10-CM

## 2018-01-06 DIAGNOSIS — K2970 Gastritis, unspecified, without bleeding: Secondary | ICD-10-CM

## 2018-01-06 DIAGNOSIS — K3189 Other diseases of stomach and duodenum: Secondary | ICD-10-CM

## 2018-01-06 MED ORDER — SODIUM CHLORIDE 0.9 % IV SOLN
500.0000 mL | INTRAVENOUS | Status: DC
Start: 2018-01-06 — End: 2020-09-19

## 2018-01-06 NOTE — Patient Instructions (Signed)
   NO ANTI INFLAMMATORY MEDICATIONS (ASPIRIN CONTAINING PRODUCTS,ALEVE,IBUPROFEN ,NAPROXEN,ETC.)   AWAIT BIOPSY RESULTS   YOU HAD AN ENDOSCOPIC PROCEDURE TODAY AT [facility]:   Refer to the procedure report that was given to you for any specific questions about what was found during the examination.  If the procedure report does not answer your questions, please call your gastroenterologist to clarify.  If you requested that your care partner not be given the details of your procedure findings, then the procedure report has been included in a sealed envelope for you to review at your convenience later.  YOU SHOULD EXPECT: Some feelings of bloating in the abdomen. Passage of more gas than usual.  Walking can help get rid of the air that was put into your GI tract during the procedure and reduce the bloating. If you had a lower endoscopy (such as a colonoscopy or flexible sigmoidoscopy) you may notice spotting of blood in your stool or on the toilet paper. If you underwent a bowel prep for your procedure, you may not have a normal bowel movement for a few days.  Please Note:  You might notice some irritation and congestion in your nose or some drainage.  This is from the oxygen used during your procedure.  There is no need for concern and it should clear up in a day or so.  SYMPTOMS TO REPORT IMMEDIATELY:     Following upper endoscopy (EGD)  Vomiting of blood or coffee ground material  New chest pain or pain under the shoulder blades  Painful or persistently difficult swallowing  New shortness of breath  Fever of 100F or higher  Black, tarry-looking stools  For urgent or emergent issues, a gastroenterologist can be reached at any hour by calling [phone].   DIET:  We do recommend a small meal at first, but then you may proceed to your regular diet.  Drink plenty of fluids but you should avoid alcoholic beverages for 24 hours.  ACTIVITY:  You should plan to take  it easy for the rest of today and you should NOT DRIVE or use heavy machinery until tomorrow (because of the sedation medicines used during the test).    FOLLOW UP: Our staff will call the number listed on your records the next business day following your procedure to check on you and address any questions or concerns that you may have regarding the information given to you following your procedure. If we do not reach you, we will leave a message.  However, if you are feeling well and you are not experiencing any problems, there is no need to return our call.  We will assume that you have returned to your regular daily activities without incident.  If any biopsies were taken you will be contacted by phone or by letter within the next 1-3 weeks.  Please call us at [phone] if you have not heard about the biopsies in 3 weeks.    SIGNATURES/CONFIDENTIALITY: You and/or your care partner have signed paperwork which will be entered into your electronic medical record.  These signatures attest to the fact that that the information above on your After Visit Summary has been reviewed and is understood.  Full responsibility of the confidentiality of this discharge information lies with you and/or your care-partner.

## 2018-01-06 NOTE — Progress Notes (Signed)
Report given to PACU, vss 

## 2018-01-06 NOTE — Progress Notes (Signed)
Pt. Reports no change in his medical or surgical history since his pre-visit 11/21/17 other than a change in BP Rx for decreased BP.

## 2018-01-06 NOTE — Op Note (Addendum)
[facility] Patient Name: Levi Alvarez Procedure Date: 01/06/2018 9:21 AM MRN: [id number] Endoscopist: Remo Lipps P. Amirra Herling MD, MD Age: 47 Referring MD:  Date of Birth: 1971/07/21 Gender: Male Account #: [id number] Procedure:                Upper GI endoscopy Indications:              suspected cirrhosis rule out esophageal varices (of                            note, patient is taking Coreg) Medicines:                Monitored Anesthesia Care Procedure:                Pre-Anesthesia Assessment:                           - Prior to the procedure, a History and Physical                            was performed, and patient medications and                            allergies were reviewed. The patient's tolerance of                            previous anesthesia was also reviewed. The risks                            and benefits of the procedure and the sedation                            options and risks were discussed with the patient.                            All questions were answered, and informed consent                            was obtained. Prior Anticoagulants: The patient has                            taken no previous anticoagulant or antiplatelet                            agents. ASA Grade Assessment: III - A patient with                            severe systemic disease. After reviewing the risks                            and benefits, the patient was deemed in                            satisfactory condition to undergo the procedure.  After obtaining informed consent, the endoscope was                            passed under direct vision. Throughout the                            procedure, the patient's blood pressure, pulse, and                            oxygen saturations were monitored continuously. The                            Model GIF-[id number] scope was introduced                            through the  mouth, and advanced to the second part                            of duodenum. The upper GI endoscopy was                            accomplished without difficulty. The patient                            tolerated the procedure well. Scope In: Scope Out: Findings:                 Esophagogastric landmarks were identified: the                            Z-line was found at 41 cm, the gastroesophageal                            junction was found at 41 cm and the upper extent of                            the gastric folds was found at 43 cm from the                            incisors.                           A 2 cm hiatal hernia was present.                           The exam of the esophagus was otherwise normal. No                            overt evidence of esophageal varices.                           Diffuse moderate inflammation characterized by                            adherent old blood, congestion (  edema), erythema                            and friability was found in the entire examined                            stomach. This could be consistent with portal                            hypertensive gastritis, biopsies were taken with a                            cold forceps for Helicobacter pylori testing.                           The exam of the stomach was otherwise normal. No                            gastric varices noted.                           The duodenal bulb and second portion of the                            duodenum were normal. Complications:            No immediate complications. Estimated blood loss:                            Minimal. Estimated Blood Loss:     Estimated blood loss was minimal. Impression:               - Esophagogastric landmarks identified.                           - Normal esophagus - no esophageal varices                           - 2 cm hiatal hernia.                           - Gastritis. Biopsied to rule out H pylori.                            - Normal duodenal bulb and second portion of the                            duodenum. Recommendation:           - Patient has a contact number available for                            emergencies. The signs and symptoms of potential                            delayed complications were discussed with the  patient. Return to normal activities tomorrow.                            Written discharge instructions were provided to the                            patient.                           - Resume previous diet.                           - Continue present medications.                           - One done with Epclusa for Hepatitis C therapy,                            recommend starting omeprazole 20mg  once daily for                            gastritis noted on this exam.                           - Avoid NSAIDS                           - Await pathology results.                           - If patient continues to take Coreg for another                            indication, no further screening for varices is                            needed [employer]. Nakai Yard MD, MD 01/06/2018 9:48:46 AM This report has been signed electronically.

## 2018-01-06 NOTE — Progress Notes (Signed)
Called to room to assist during endoscopic procedure.  Patient ID and intended procedure confirmed with present staff. Received instructions for my participation in the procedure from the performing physician.  

## 2018-01-07 ENCOUNTER — Telehealth: Payer: Self-pay

## 2018-01-07 ENCOUNTER — Encounter: Payer: Self-pay | Admitting: Nurse Practitioner

## 2018-01-07 NOTE — Telephone Encounter (Signed)
  Follow up Call-  Call back number 01/06/2018  Post procedure Call Back phone  # [phone]  Permission to leave phone message Yes  Some recent data might be hidden     Patient questions:  Do you have a fever, pain , or abdominal swelling? No. Pain Score  0 *  Have you tolerated food without any problems? Yes.    Have you been able to return to your normal activities? Yes.    Do you have any questions about your discharge instructions: Diet   No. Medications  No. Follow up visit  No.  Do you have questions or concerns about your Care? No.  Actions: * If pain score is 4 or above: No action needed, pain <4.

## 2018-01-13 ENCOUNTER — Encounter: Payer: Self-pay | Admitting: Gastroenterology

## 2018-01-13 NOTE — Telephone Encounter (Signed)
Patient refill request through mychart

## 2018-01-17 ENCOUNTER — Ambulatory Visit (HOSPITAL_BASED_OUTPATIENT_CLINIC_OR_DEPARTMENT_OTHER): Payer: Self-pay | Attending: Nurse Practitioner

## 2018-01-17 MED FILL — ?CHLORTHALIDONE 25 MG TABLE: 25 | 30 days supply | Qty: 30 | Fill #1

## 2018-01-20 MED FILL — POTASSIUM CL 10 MEQ TAB SA: 10 | 30 days supply | Qty: 30 | Fill #2

## 2018-01-22 ENCOUNTER — Ambulatory Visit: Payer: Self-pay | Attending: Nurse Practitioner | Admitting: Physician Assistant

## 2018-01-22 VITALS — BP 119/77 | HR 80 | Temp 98.7°F | Resp 18 | Ht 70.0 in | Wt 243.0 lb

## 2018-01-22 DIAGNOSIS — M25562 Pain in left knee: Secondary | ICD-10-CM | POA: Insufficient documentation

## 2018-01-22 DIAGNOSIS — Z79899 Other long term (current) drug therapy: Secondary | ICD-10-CM | POA: Insufficient documentation

## 2018-01-22 DIAGNOSIS — I10 Essential (primary) hypertension: Secondary | ICD-10-CM | POA: Insufficient documentation

## 2018-01-22 DIAGNOSIS — Z79891 Long term (current) use of opiate analgesic: Secondary | ICD-10-CM | POA: Insufficient documentation

## 2018-01-22 DIAGNOSIS — G8929 Other chronic pain: Secondary | ICD-10-CM | POA: Insufficient documentation

## 2018-01-22 DIAGNOSIS — Z886 Allergy status to analgesic agent status: Secondary | ICD-10-CM | POA: Insufficient documentation

## 2018-01-22 MED ORDER — TRAMADOL HCL 50 MG PO TABS: 50.0000 mg | ORAL_TABLET | Freq: Three times a day (TID) | ORAL | 0 refills | Status: DC | PRN

## 2018-01-22 MED FILL — traMADol HCL 50 MG TABS: 50 | 10 days supply | Qty: 30 | Fill #0

## 2018-01-22 NOTE — Progress Notes (Signed)
Patient ID: Levi Alvarez, male   DOB: 23-Oct-1971, 47 y.o.   MRN: [id number]   Levi Alvarez, is a 47 y.o. male  KKX:[id number]  JIR:[id number]  DOB - 1971/01/26  Subjective:  Chief Complaint and HPI: Levi Alvarez is a 47 y.o. male here today for tramadol for L knee pain.  Pain is chronic from injury about 17 years ago and bothering him more and more.  Saw Levi Alvarez about 3 months ago and received cortisone injections.  Does not have insurance and can't afford to go back.  No new injury but cortisone seems to be wearing off.  Also says he has chronic back pain and is awaiting disability.  He had multiple surgeries on his L knee.  No fever; no redness   ROS:   Constitutional:  No f/c, No night sweats, No unexplained weight loss. EENT:  No vision changes, No blurry vision, No hearing changes. No mouth, throat, or ear problems.  Respiratory: No cough, No SOB Cardiac: No CP, no palpitations GI:  No abd pain, No N/V/D. GU: No Urinary s/sx Musculoskeletal: + joint pain Neuro: No headache, no dizziness, no motor weakness.  Skin: No rash Endocrine:  No polydipsia. No polyuria.  Psych: Denies SI/HI  No problems updated.  ALLERGIES: Allergies  Allergen Reactions  . Naproxen Hives and Swelling    PAST MEDICAL HISTORY: Past Medical History:  Diagnosis Date  . Hepatitis C antibody positive in blood   . High blood pressure     MEDICATIONS AT HOME: Prior to Admission medications   Medication Sig Start Date End Date Taking? Authorizing Provider  albuterol (PROAIR HFA) 108 (90 Base) MCG/ACT inhaler Inhale 1 puff every 6 (six) hours as needed into the lungs for wheezing or shortness of breath. 09/20/17  Yes Gildardo Pounds, NP  albuterol (PROVENTIL) (2.5 MG/3ML) 0.083% nebulizer solution Take 3 mLs (2.5 mg total) by nebulization every 6 (six) hours as needed for wheezing or shortness of breath. 11/01/17  Yes Gildardo Pounds, NP  BREO ELLIPTA 200-25 MCG/INH AEPB INHALE 1 PUFF INTO THE  LUNGS DAILY 11/01/17  Yes [provider]  chlorthalidone (HYGROTON) 25 MG tablet Take 1 tablet (25 mg total) by mouth daily. 12/18/17 03/18/18 Yes Gildardo Pounds, NP  fluticasone furoate-vilanterol (BREO ELLIPTA) 200-25 MCG/INH AEPB Inhale 1 puff into the lungs daily. 11/01/17  Yes Gildardo Pounds, NP  LOPERAMIDE A-D 2 MG tablet TAKE 1 CAPSULE BY MOUTH 4 TIMES DAILY AS NEEDED FOR UP TO 10 DAYS FOR DIARRHEA OR LOOSE STOOLS 11/01/17  Yes [provider]  Multiple Vitamin (MULTI-VITAMINS) TABS Take 1 tablet by mouth 1 day or 1 dose. 04/04/15  Yes [provider]  potassium chloride (KLOR-CON 10) 10 MEQ tablet Take 1 tablet (10 mEq total) by mouth daily. 11/22/17 02/20/18 Yes Gildardo Pounds, NP  Sofosbuvir-Velpatasvir (EPCLUSA) 400-100 MG TABS Take 1 tablet by mouth daily. 10/08/17  Yes Comer, Okey Regal, MD  traMADol (ULTRAM) 50 MG tablet Take 1 tablet (50 mg total) by mouth every 8 (eight) hours as needed. 01/22/18  Yes Freeman Caldron M, PA-C  carvedilol (COREG) 3.125 MG tablet Take 1 tablet (3.125 mg total) by mouth 2 (two) times daily. 12/01/17 12/31/17  Gildardo Pounds, NP     Objective:  EXAM:   Vitals:   01/22/18 1603  BP: 119/77  Pulse: 80  Resp: 18  Temp: 98.7 F (37.1 C)  TempSrc: Oral  SpO2: 98%  Weight: 243 lb (110.2 kg)  Height:  5\' 10"  (1.778 m)    General appearance : A&OX3. NAD. Non-toxic-appearing HEENT: Atraumatic and Normocephalic.  PERRLA. EOM intact.   Neck: supple, no JVD. No cervical lymphadenopathy. No thyromegaly Chest/Lungs:  Breathing-non-labored, Good air entry bilaterally, breath sounds normal without rales, rhonchi, or wheezing  CVS: S1 S2 regular, no murmurs, gallops, rubs  Extremities: Bilateral Lower Ext shows no edema, both legs are warm to touch with = pulse throughout.  L knee without erythema or ballotment.  There is extensive scarring from previous surgeries.  No laxity of ligaments.  ROM is good.   Neurology:  CN II-XII  grossly intact, Non focal.   Psych:  TP linear. J/I WNL. Normal speech. Appropriate eye contact and affect.  Skin:  No Rash  Data Review Lab Results  Component Value Date   HGBA1C 5.2 06/05/2017     Assessment & Plan   1. Chronic pain of left knee Tramadol # 30 given - Ambulatory referral to Pain Clinic  2. Other chronic pain - Ambulatory referral to Pain Clinic   Patient have been counseled extensively about nutrition and exercise  Return for keep 02/18/2018 appt with [facility].  The patient was given clear instructions to go to ER or return to medical center if symptoms don't improve, worsen or new problems develop. The patient verbalized understanding. The patient was told to call to get lab results if they haven't heard anything in the next week.     Freeman Caldron, PA-C [facility], [place]   01/22/2018, 4:11 PM

## 2018-02-04 ENCOUNTER — Ambulatory Visit (INDEPENDENT_AMBULATORY_CARE_PROVIDER_SITE_OTHER): Payer: Self-pay | Admitting: Pharmacist

## 2018-02-04 DIAGNOSIS — B182 Chronic viral hepatitis C: Secondary | ICD-10-CM

## 2018-02-04 NOTE — Progress Notes (Signed)
[facility] for Infectious Disease Pharmacy Visit  HPI: Levi Alvarez is a 47 y.o. male who presents to the [facility] for his end of therapy Hep C follow-up.  He has genotype 1a, F4 with CP class A, and completed 12 weeks of Epclusa last Thursday on 3/21.  Patient Active Problem List   Diagnosis Date Noted  . Chronic hepatitis C without hepatic coma ([place]) 10/08/2017  . Cirrhosis ([place]) 10/08/2017  . Chronic pain of left knee 07/23/2017  . Essential hypertension 07/12/2017  . OSA (obstructive sleep apnea) 07/12/2017  . Class 2 obesity with serious comorbidity and body mass index (BMI) of 35.0 to 35.9 in adult 07/12/2017  . Elevated liver enzymes 07/12/2017  . Other chronic pain 07/12/2017  . Smoker 07/12/2017  . Hyperlipidemia 07/12/2017  . Polyarthralgia 07/12/2017  . Snoring 06/05/2017    Patient's Medications  New Prescriptions   No medications on file  Previous Medications   ALBUTEROL (PROAIR HFA) 108 (90 BASE) MCG/ACT INHALER    Inhale 1 puff every 6 (six) hours as needed into the lungs for wheezing or shortness of breath.   ALBUTEROL (PROVENTIL) (2.5 MG/3ML) 0.083% NEBULIZER SOLUTION    Take 3 mLs (2.5 mg total) by nebulization every 6 (six) hours as needed for wheezing or shortness of breath.   BREO ELLIPTA 200-25 MCG/INH AEPB    INHALE 1 PUFF INTO THE LUNGS DAILY   CARVEDILOL (COREG) 3.125 MG TABLET    Take 1 tablet (3.125 mg total) by mouth 2 (two) times daily.   CHLORTHALIDONE (HYGROTON) 25 MG TABLET    Take 1 tablet (25 mg total) by mouth daily.   FLUTICASONE FUROATE-VILANTEROL (BREO ELLIPTA) 200-25 MCG/INH AEPB    Inhale 1 puff into the lungs daily.   LOPERAMIDE A-D 2 MG TABLET    TAKE 1 CAPSULE BY MOUTH 4 TIMES DAILY AS NEEDED FOR UP TO 10 DAYS FOR DIARRHEA OR LOOSE STOOLS   MULTIPLE VITAMIN (MULTI-VITAMINS) TABS    Take 1 tablet by mouth 1 day or 1 dose.   POTASSIUM CHLORIDE (KLOR-CON 10) 10 MEQ TABLET    Take 1 tablet (10 mEq total) by mouth daily.   TRAMADOL (ULTRAM) 50 MG TABLET    Take 1 tablet (50 mg total) by mouth every 8 (eight) hours as needed.  Modified Medications   No medications on file  Discontinued Medications   SOFOSBUVIR-VELPATASVIR (EPCLUSA) 400-100 MG TABS    Take 1 tablet by mouth daily.    Allergies: Allergies  Allergen Reactions  . Naproxen Hives and Swelling    Past Medical History: Past Medical History:  Diagnosis Date  . Hepatitis C antibody positive in blood   . High blood pressure     Social History: Social History   Socioeconomic History  . Marital status: Divorced    Spouse name: Not on file  . Number of children: Not on file  . Years of education: Not on file  . Highest education level: Not on file  Occupational History  . Not on file  Social Needs  . Financial resource strain: Not on file  . Food insecurity:    Worry: Not on file    Inability: Not on file  . Transportation needs:    Medical: Not on file    Non-medical: Not on file  Tobacco Use  . Smoking status: Current Every Day Smoker    Packs/day: 1.00    Types: Cigarettes  . Smokeless tobacco: Former Systems developer    Types: Loss adjuster, chartered  Substance and Sexual Activity  . Alcohol use: No    Alcohol/week: 10.8 oz    Types: 18 Cans of beer per week    Frequency: Never    Comment: daily  . Drug use: No  . Sexual activity: Yes    Partners: Female  Lifestyle  . Physical activity:    Days per week: Not on file    Minutes per session: Not on file  . Stress: Not on file  Relationships  . Social connections:    Talks on phone: Not on file    Gets together: Not on file    Attends religious service: Not on file    Active member of club or organization: Not on file    Attends meetings of clubs or organizations: Not on file    Relationship status: Not on file  Other Topics Concern  . Not on file  Social History Narrative  . Not on file    Labs: Hep B S Ab (no units)  Date Value  10/08/2017 NON-REACTIVE   Hepatitis B Surface Ag (no  units)  Date Value  07/12/2017 NON-REACTIVE   HCV Ab (no units)  Date Value  07/12/2017 REACTIVE (A)    Lab Results  Component Value Date   HCVGENOTYPE 1a 10/08/2017    Hepatitis C RNA quantitative Latest Ref Rng & Units 11/19/2017 07/12/2017  HCV Quantitative NOT DETECTED IU/mL - 6,460,000(H)  HCV Quantitative Log NOT DETECT Log IU/mL <1.18 NOT DETECTED 6.81(H)    AST (U/L)  Date Value  11/21/2017 49 (H)  07/12/2017 284 (H)  06/05/2017 296 (H)   ALT (U/L)  Date Value  11/21/2017 39  07/12/2017 164 (H)  06/05/2017 158 (H)   INR (no units)  Date Value  10/08/2017 1.1    Fibrosis Score: F3/F4 as assessed by elastography   Child-Pugh Score: A  Assessment: Rozell is here today to follow-up for his Hep C treatment. He completed 12 weeks of Epclusa last week with no issues or missed doses.  He felt nauseous at first but it resolved with time.  He also felt a little fatigued but that has since resolved since stopping the medication. He took the medication at exactly 7pm every evening. His early on treatment viral load was already undetectable.  I will get another viral load today and have him follow-up with Dr. Linus Salmons in 3 months.   Plan: - Completed 12 weeks of Epclusa - Hep C viral load today - F/u with Dr. Linus Salmons 7/3 at 345pm  Chong Wojdyla L. Havard Radigan, PharmD, [place], [facility] [facility] for Infect[facility] 02/04/2018, 4:20 PM

## 2018-02-06 LAB — HEPATITIS C RNA QUANTITATIVE
HCV Quantitative Log: <1.18 Log IU/mL
HCV RNA, PCR, QN: <15 IU/mL

## 2018-02-07 ENCOUNTER — Telehealth: Payer: Self-pay | Admitting: Pharmacist

## 2018-02-07 NOTE — Telephone Encounter (Signed)
Called Levi Alvarez to let him know that his Hep C viral load was undetectable.  He will follow-up with Dr. Linus Salmons in July.

## 2018-02-17 MED FILL — ?CHLORTHALIDONE 25 MG TABLE: 25 | 30 days supply | Qty: 30 | Fill #2

## 2018-02-17 MED FILL — ?CARVEDILOL 3.125 MG TABLET: 3.125 | 30 days supply | Qty: 60 | Fill #2

## 2018-02-18 ENCOUNTER — Ambulatory Visit: Payer: Self-pay | Admitting: Nurse Practitioner

## 2018-02-19 ENCOUNTER — Other Ambulatory Visit: Payer: Self-pay | Admitting: Nurse Practitioner

## 2018-02-19 DIAGNOSIS — I10 Essential (primary) hypertension: Secondary | ICD-10-CM

## 2018-02-19 MED FILL — !BREO ELLIPTA 200-25 MCG: 200-25 | 30 days supply | Qty: 60 | Fill #2

## 2018-02-20 MED FILL — POTASSIUM CL ER 10 MEQ TAB: 10 | 30 days supply | Qty: 30 | Fill #0

## 2018-03-05 ENCOUNTER — Encounter: Payer: Self-pay | Admitting: Physical Medicine & Rehabilitation

## 2018-03-19 ENCOUNTER — Ambulatory Visit: Payer: Self-pay | Admitting: Nurse Practitioner

## 2018-03-20 ENCOUNTER — Other Ambulatory Visit: Payer: Self-pay | Admitting: Nurse Practitioner

## 2018-03-20 DIAGNOSIS — I159 Secondary hypertension, unspecified: Secondary | ICD-10-CM

## 2018-03-20 DIAGNOSIS — I10 Essential (primary) hypertension: Secondary | ICD-10-CM

## 2018-03-20 MED FILL — !BREO ELLIPTA 200-25 MCG: 200-25 | 30 days supply | Qty: 60 | Fill #3

## 2018-03-21 ENCOUNTER — Telehealth: Payer: Self-pay | Admitting: Nurse Practitioner

## 2018-03-21 ENCOUNTER — Other Ambulatory Visit: Payer: Self-pay

## 2018-03-21 DIAGNOSIS — I10 Essential (primary) hypertension: Secondary | ICD-10-CM

## 2018-03-21 DIAGNOSIS — I159 Secondary hypertension, unspecified: Secondary | ICD-10-CM

## 2018-03-21 MED ORDER — CARVEDILOL 3.125 MG PO TABS: 3.1250 mg | ORAL_TABLET | Freq: Two times a day (BID) | ORAL | 2 refills | Status: DC

## 2018-03-21 MED ORDER — CHLORTHALIDONE 25 MG PO TABS
25.0000 mg | ORAL_TABLET | Freq: Every day | ORAL | 0 refills | Status: DC
Start: 2018-03-21 — End: 2018-04-15

## 2018-03-21 NOTE — Progress Notes (Signed)
Pt. Called requesting his blood pressure medication refill. Rx sent.

## 2018-03-21 NOTE — Telephone Encounter (Signed)
Blood pressure medication

## 2018-03-21 NOTE — Telephone Encounter (Signed)
Medication been refill.  Sent [facility] pharmacy.

## 2018-03-21 NOTE — Telephone Encounter (Signed)
[phone] Patients girlfriend called and requested for listed medication to be refilled and sent to [facility] pharmacy. Please fu at your earliest convenience. If possible today Monday at the latest. Completely out Saturday.

## 2018-03-24 MED FILL — ?CARVEDILOL 3.125 MG TABLET: 3.125 | 30 days supply | Qty: 60 | Fill #0

## 2018-03-24 MED FILL — ?CHLORTHALIDONE 25 MG TABLE: 25 | 30 days supply | Qty: 30 | Fill #0

## 2018-03-25 ENCOUNTER — Other Ambulatory Visit: Payer: Self-pay | Admitting: Physician Assistant

## 2018-03-25 ENCOUNTER — Other Ambulatory Visit: Payer: Self-pay | Admitting: Nurse Practitioner

## 2018-03-25 DIAGNOSIS — I10 Essential (primary) hypertension: Secondary | ICD-10-CM

## 2018-03-25 NOTE — Telephone Encounter (Signed)
Will rout to pcp

## 2018-03-25 NOTE — Telephone Encounter (Signed)
Will route to PCP 

## 2018-03-27 MED ORDER — POTASSIUM CHLORIDE ER 10 MEQ PO TBCR: 10.0000 meq | EXTENDED_RELEASE_TABLET | Freq: Every day | ORAL | 0 refills | Status: DC

## 2018-03-27 MED ORDER — TRAMADOL HCL 50 MG PO TABS: 50.0000 mg | ORAL_TABLET | Freq: Three times a day (TID) | ORAL | 0 refills | Status: DC | PRN

## 2018-03-28 MED FILL — POTASSIUM CL ER 10 MEQ TAB: 10 | 30 days supply | Qty: 30 | Fill #0

## 2018-03-28 MED FILL — traMADol HCL 50 MG TABS: 50 | 10 days supply | Qty: 30 | Fill #0

## 2018-04-08 ENCOUNTER — Ambulatory Visit: Payer: Self-pay | Admitting: Physical Medicine & Rehabilitation

## 2018-04-08 ENCOUNTER — Encounter: Payer: Self-pay | Attending: Physical Medicine & Rehabilitation

## 2018-04-15 ENCOUNTER — Other Ambulatory Visit: Payer: Self-pay | Admitting: Nurse Practitioner

## 2018-04-15 DIAGNOSIS — I10 Essential (primary) hypertension: Secondary | ICD-10-CM

## 2018-04-15 MED ORDER — CHLORTHALIDONE 25 MG PO TABS: 25.0000 mg | ORAL_TABLET | Freq: Every day | ORAL | 0 refills | Status: DC

## 2018-04-21 ENCOUNTER — Ambulatory Visit: Payer: Self-pay | Attending: Nurse Practitioner | Admitting: Nurse Practitioner

## 2018-04-21 ENCOUNTER — Encounter: Payer: Self-pay | Admitting: Nurse Practitioner

## 2018-04-21 VITALS — BP 141/89 | HR 65 | Temp 98.3°F | Ht 70.0 in | Wt 243.0 lb

## 2018-04-21 DIAGNOSIS — W19XXXD Unspecified fall, subsequent encounter: Secondary | ICD-10-CM | POA: Insufficient documentation

## 2018-04-21 DIAGNOSIS — Z760 Encounter for issue of repeat prescription: Secondary | ICD-10-CM | POA: Insufficient documentation

## 2018-04-21 DIAGNOSIS — Z886 Allergy status to analgesic agent status: Secondary | ICD-10-CM | POA: Insufficient documentation

## 2018-04-21 DIAGNOSIS — Z79899 Other long term (current) drug therapy: Secondary | ICD-10-CM | POA: Insufficient documentation

## 2018-04-21 DIAGNOSIS — G8929 Other chronic pain: Secondary | ICD-10-CM | POA: Insufficient documentation

## 2018-04-21 DIAGNOSIS — I10 Essential (primary) hypertension: Secondary | ICD-10-CM | POA: Insufficient documentation

## 2018-04-21 DIAGNOSIS — F1290 Cannabis use, unspecified, uncomplicated: Secondary | ICD-10-CM | POA: Insufficient documentation

## 2018-04-21 MED ORDER — DICLOFENAC SODIUM 1 % TD GEL: 4.0000 g | Freq: Four times a day (QID) | TRANSDERMAL | 1 refills | Status: DC

## 2018-04-21 MED ORDER — CYCLOBENZAPRINE HCL 10 MG PO TABS: 10.0000 mg | ORAL_TABLET | Freq: Three times a day (TID) | ORAL | 1 refills | Status: DC | PRN

## 2018-04-21 MED ORDER — CHLORTHALIDONE 25 MG PO TABS: 25.0000 mg | ORAL_TABLET | Freq: Every day | ORAL | 0 refills | Status: DC

## 2018-04-21 MED FILL — CHLORTHALIDONE 25 MG TAB: 25 | 30 days supply | Qty: 30 | Fill #1

## 2018-04-21 MED FILL — !BREO ELLIPTA 200-25 MCG: 200-25 | 30 days supply | Qty: 60 | Fill #4

## 2018-04-21 MED FILL — CYCLOBENZAPRINE 10 MG TAB: 10 | 20 days supply | Qty: 60 | Fill #0

## 2018-04-21 MED FILL — CARVEDILOL 3.125 MG TABLET: 3.125 | 30 days supply | Qty: 60 | Fill #1

## 2018-04-21 NOTE — Progress Notes (Signed)
Assessment & Plan:  [name] was seen today for follow-up and medication refill.  Diagnoses and all orders for this visit:  Essential hypertension -     CMP14+EGFR -     chlorthalidone (HYGROTON) 25 MG tablet; Take 1 tablet (25 mg total) by mouth daily. Continue all antihypertensives as prescribed.  Remember to bring in your blood pressure log with you for your follow up appointment.  DASH/Mediterranean Diets are healthier choices for HTN.   Other chronic pain -     diclofenac sodium (VOLTAREN) 1 % GEL; Apply 4 g topically 4 (four) times daily. -     Ambulatory referral to Pain Clinic Work on losing weight to help reduce pain. May alternate with heat and ice application for pain relief. May also alternate with acetaminophen s prescribed for pain.   Fall, subsequent encounter -     cyclobenzaprine (FLEXERIL) 10 MG tablet; Take 1 tablet (10 mg total) by mouth 3 (three) times daily as needed for muscle spasms.  Patient has been counseled on age-appropriate routine health concerns for screening and prevention. These are reviewed and up-to-date. Referrals have been placed accordingly. Immunizations are up-to-date or declined.    Subjective:   Chief Complaint  Patient presents with  . Follow-up    Pt. is here for follow-up.   . Medication Refill   HPI Levi Alvarez 47 y.o. male presents to office today for follow up to HTN and chronic knee pain.   Essential Hypertension Chronic. Stable. He endorses medication compliance taking chlorthalidone 25 mg daily, carvedilol 3.159m BID.  Denies chest pain, shortness of breath, palpitations, lightheadedness, dizziness, headaches or BLE edema.  BP Readings from Last 3 Encounters:  04/21/18 (!) 141/89  01/22/18 119/77  01/06/18 (!) 124/93   Chronic Knee pain Ongoing. Aggravated by bending and prolonged standing. He was taking tramadol however today when I instructed him that we would be performing a urine drug screen he informs me that he  smokes marijuana daily. He is aware that I will not be able to refill his tramadol due to his daily use of THC. He had an appt with pain management and called the day of to cancel. He was considered a no show and is upset that he will have to pay the $50 required for no show fee in order to be seen . He fell over a week ago and currently endorses left sided hip and back pain. He is able to bear weight but endorses muscle stiffness. Will prescribe flexeril. As I will no longer fill his tramadol I have started him on voltaren gel. He has an allergy to naproxen so I would like to avoid prescribing NSAIDs by mouth.    Review of Systems  Constitutional: Negative for fever, malaise/fatigue and weight loss.  HENT: Negative.  Negative for nosebleeds.   Eyes: Negative.  Negative for blurred vision, double vision and photophobia.  Respiratory: Negative.  Negative for cough and shortness of breath.   Cardiovascular: Negative.  Negative for chest pain, palpitations and leg swelling.  Gastrointestinal: Negative.  Negative for heartburn, nausea and vomiting.  Musculoskeletal: Positive for falls, joint pain and myalgias.  Neurological: Negative.  Negative for dizziness, focal weakness, seizures and headaches.  Psychiatric/Behavioral: Negative.  Negative for suicidal ideas.    Past Medical History:  Diagnosis Date  . Hepatitis C antibody positive in blood   . High blood pressure     Past Surgical History:  Procedure Laterality Date  . arm surgery    .  KNEE SURGERY      Family History  Problem Relation Age of Onset  . Colitis Mother   . Arthritis Mother   . Cancer Father        lymphoma?  . Cancer Paternal Grandfather        bone  . Heart disease Neg Hx   . Stroke Neg Hx   . Diabetes Neg Hx     Social History Reviewed with no changes to be made today.   Outpatient Medications Prior to Visit  Medication Sig Dispense Refill  . albuterol (PROAIR HFA) 108 (90 Base) MCG/ACT inhaler Inhale 1  puff every 6 (six) hours as needed into the lungs for wheezing or shortness of breath. 18 g 0  . albuterol (PROVENTIL) (2.5 MG/3ML) 0.083% nebulizer solution Take 3 mLs (2.5 mg total) by nebulization every 6 (six) hours as needed for wheezing or shortness of breath. 75 mL 12  . BREO ELLIPTA 200-25 MCG/INH AEPB INHALE 1 PUFF INTO THE LUNGS DAILY  6  . fluticasone furoate-vilanterol (BREO ELLIPTA) 200-25 MCG/INH AEPB Inhale 1 puff into the lungs daily. 1 each 6  . Multiple Vitamin (MULTI-VITAMINS) TABS Take 1 tablet by mouth 1 day or 1 dose.    . potassium chloride (K-DUR) 10 MEQ tablet Take 1 tablet (10 mEq total) by mouth daily. 30 tablet 0  . traMADol (ULTRAM) 50 MG tablet Take 1 tablet (50 mg total) by mouth every 8 (eight) hours as needed. 30 tablet 0  . chlorthalidone (HYGROTON) 25 MG tablet Take 1 tablet (25 mg total) by mouth daily. 90 tablet 0  . carvedilol (COREG) 3.125 MG tablet Take 1 tablet (3.125 mg total) by mouth 2 (two) times daily. 60 tablet 2  . LOPERAMIDE A-D 2 MG tablet TAKE 1 CAPSULE BY MOUTH 4 TIMES DAILY AS NEEDED FOR UP TO 10 DAYS FOR DIARRHEA OR LOOSE STOOLS  0   Facility-Administered Medications Prior to Visit  Medication Dose Route Frequency Provider Last Rate Last Dose  . 0.9 %  sodium chloride infusion  500 mL Intravenous Continuous Armbruster, Carlota Raspberry, MD        Allergies  Allergen Reactions  . Naproxen Hives and Swelling       Objective:    BP (!) 141/89 (BP Location: Right Arm, Patient Position: Sitting, Cuff Size: Large)   Pulse 65   Temp 98.3 F (36.8 C) (Oral)   Ht 5' 10"  (1.778 m)   Wt 243 lb (110.2 kg)   SpO2 94%   BMI 34.87 kg/m  Wt Readings from Last 3 Encounters:  04/21/18 243 lb (110.2 kg)  01/22/18 243 lb (110.2 kg)  01/06/18 235 lb (106.6 kg)    Physical Exam  Constitutional: He is oriented to person, place, and time. He appears well-developed and well-nourished. He is cooperative.  HENT:  Head: Normocephalic and atraumatic.  Eyes:  EOM are normal.  Neck: Normal range of motion.  Cardiovascular: Normal rate, regular rhythm and normal heart sounds. Exam reveals no gallop and no friction rub.  No murmur heard. Pulmonary/Chest: Effort normal and breath sounds normal. No tachypnea. No respiratory distress. He has no decreased breath sounds. He has no wheezes. He has no rhonchi. He has no rales. He exhibits no tenderness.  Abdominal: Soft. Bowel sounds are normal.  Musculoskeletal: He exhibits no edema.       Left knee: He exhibits decreased range of motion and swelling. Tenderness found. Medial joint line and lateral joint line tenderness noted.  Legs: Neurological: He is alert and oriented to person, place, and time. Coordination normal.  Skin: Skin is warm and dry.  Psychiatric: He has a normal mood and affect. His behavior is normal. Judgment and thought content normal.  Nursing note and vitals reviewed.        Patient has been counseled extensively about nutrition and exercise as well as the importance of adherence with medications and regular follow-up. The patient was given clear instructions to go to ER or return to medical center if symptoms don't improve, worsen or new problems develop. The patient verbalized understanding.   Follow-up: Return in about 3 months (around 07/22/2018).   Gildardo Pounds, FNP-BC [facility] and [place], [place]   04/21/2018, 5:35 PM

## 2018-04-21 NOTE — Patient Instructions (Signed)

## 2018-04-22 ENCOUNTER — Other Ambulatory Visit: Payer: Self-pay | Admitting: Nurse Practitioner

## 2018-04-22 DIAGNOSIS — I10 Essential (primary) hypertension: Secondary | ICD-10-CM

## 2018-04-22 LAB — CMP14+EGFR
A/G RATIO: 1.1 — AB (ref 1.2–2.2)
ALT: 55 IU/L — ABNORMAL HIGH (ref 0–44)
AST: 105 IU/L — AB (ref 0–40)
Albumin: 4.3 g/dL (ref 3.5–5.5)
Alkaline Phosphatase: 74 IU/L (ref 39–117)
BILIRUBIN TOTAL: 0.5 mg/dL (ref 0.0–1.2)
BUN/Creatinine Ratio: 9 (ref 9–20)
BUN: 7 mg/dL (ref 6–24)
CALCIUM: 9.4 mg/dL (ref 8.7–10.2)
CO2: 28 mmol/L (ref 20–29)
Chloride: 92 mmol/L — ABNORMAL LOW (ref 96–106)
Creatinine, Ser: 0.81 mg/dL (ref 0.76–1.27)
GFR calc Af Amer: 123 mL/min/{1.73_m2} (ref >59)
GFR, EST NON AFRICAN AMERICAN: 107 mL/min/{1.73_m2} (ref >59)
Globulin, Total: 4 g/dL (ref 1.5–4.5)
Glucose: 98 mg/dL (ref 65–99)
POTASSIUM: 3 mmol/L — AB (ref 3.5–5.2)
Sodium: 136 mmol/L (ref 134–144)
Total Protein: 8.3 g/dL (ref 6.0–8.5)

## 2018-04-22 MED ORDER — POTASSIUM CHLORIDE ER 10 MEQ PO TBCR: 10.0000 meq | EXTENDED_RELEASE_TABLET | Freq: Every day | ORAL | 1 refills | Status: DC

## 2018-04-22 MED FILL — POTASSIUM CL ER 10 MEQ TAB: 10 | 30 days supply | Qty: 30 | Fill #0

## 2018-05-06 ENCOUNTER — Ambulatory Visit: Payer: Self-pay

## 2018-05-07 ENCOUNTER — Ambulatory Visit: Payer: Self-pay | Attending: Nurse Practitioner

## 2018-05-14 ENCOUNTER — Ambulatory Visit (INDEPENDENT_AMBULATORY_CARE_PROVIDER_SITE_OTHER): Payer: Self-pay | Admitting: Internal Medicine

## 2018-05-14 ENCOUNTER — Encounter: Payer: Self-pay | Admitting: Internal Medicine

## 2018-05-14 VITALS — BP 128/85 | HR 84 | Temp 98.5°F | Ht 70.0 in | Wt 236.0 lb

## 2018-05-14 DIAGNOSIS — B182 Chronic viral hepatitis C: Secondary | ICD-10-CM

## 2018-05-14 DIAGNOSIS — K7460 Unspecified cirrhosis of liver: Secondary | ICD-10-CM

## 2018-05-14 DIAGNOSIS — R748 Abnormal levels of other serum enzymes: Secondary | ICD-10-CM

## 2018-05-14 NOTE — Progress Notes (Signed)
   Subjective:    Patient ID: Levi Alvarez, male    DOB: May 15, 1971, 47 y.o.   MRN: [id number]  HPI Here for follow up of chronic hepatitis C He has genotype 1a and completed 12 weeks of Epclusa.  He has early cirrhosis noted on ultrasound with normal platelets and had an EGD with no issues found.  Has less fatigue and has noted hair growing in now.  No new issues.    Review of Systems  Constitutional: Negative for fatigue.  Gastrointestinal: Negative for diarrhea.  Skin: Negative for rash.       Objective:   Physical Exam  Constitutional: He appears well-developed and well-nourished. No distress.  Eyes: No scleral icterus.  Cardiovascular: Normal rate, regular rhythm and normal heart sounds.  No murmur heard. Pulmonary/Chest: Effort normal and breath sounds normal. No respiratory distress.  Skin: No rash noted.    SH: + tobacco      Assessment & Plan:

## 2018-05-14 NOTE — Assessment & Plan Note (Signed)
Will do [place] screening and follow up in 6 months.

## 2018-05-14 NOTE — Assessment & Plan Note (Signed)
SVR 12 today to confirm cure.

## 2018-05-14 NOTE — Assessment & Plan Note (Signed)
Recent transaminitis but had a recent viral illness.  Hopefully not a hepatitis C relapse.  Will recheck

## 2018-05-15 LAB — COMPLETE METABOLIC PANEL WITH GFR
AG Ratio: 1 (calc) (ref 1.0–2.5)
ALKALINE PHOSPHATASE (APISO): 87 U/L (ref 40–115)
ALT: 59 U/L — AB (ref 9–46)
AST: 109 U/L — AB (ref 10–40)
Albumin: 4.3 g/dL (ref 3.6–5.1)
BUN: 12 mg/dL (ref 7–25)
CO2: 30 mmol/L (ref 20–32)
Calcium: 10 mg/dL (ref 8.6–10.3)
Chloride: 92 mmol/L — ABNORMAL LOW (ref 98–110)
Creat: 0.92 mg/dL (ref 0.60–1.35)
GFR, Est African American: 115 mL/min/{1.73_m2} (ref >=60)
GFR, Est Non African American: 99 mL/min/{1.73_m2} (ref >=60)
GLOBULIN: 4.2 g/dL — AB (ref 1.9–3.7)
GLUCOSE: 102 mg/dL — AB (ref 65–99)
POTASSIUM: 3.5 mmol/L (ref 3.5–5.3)
SODIUM: 131 mmol/L — AB (ref 135–146)
Total Bilirubin: 1.1 mg/dL (ref 0.2–1.2)
Total Protein: 8.5 g/dL — ABNORMAL HIGH (ref 6.1–8.1)

## 2018-05-16 MED FILL — ?CHLORTHALIDONE 25 MG TABLE: 25 | 30 days supply | Qty: 30 | Fill #2

## 2018-05-17 LAB — HEPATITIS C RNA QUANTITATIVE
HCV Quantitative Log: <1.18 Log IU/mL
HCV RNA, PCR, QN: <15 IU/mL

## 2018-05-23 MED FILL — POTASSIUM CL ER 10 MEQ TAB: 10 | 30 days supply | Qty: 30 | Fill #1

## 2018-05-23 MED FILL — ?CARVEDILOL 3.125 MG TABLET: 3.125 | 30 days supply | Qty: 60 | Fill #2

## 2018-05-23 MED FILL — !BREO ELLIPTA 200-25 MCG: 200-25 | 30 days supply | Qty: 60 | Fill #5

## 2018-05-30 ENCOUNTER — Ambulatory Visit (INDEPENDENT_AMBULATORY_CARE_PROVIDER_SITE_OTHER): Payer: Self-pay | Admitting: Physician Assistant

## 2018-05-30 ENCOUNTER — Other Ambulatory Visit: Payer: Self-pay | Admitting: Nurse Practitioner

## 2018-05-30 DIAGNOSIS — Z23 Encounter for immunization: Secondary | ICD-10-CM

## 2018-05-30 DIAGNOSIS — K7460 Unspecified cirrhosis of liver: Secondary | ICD-10-CM

## 2018-05-30 NOTE — Telephone Encounter (Signed)
Will forward to pcp

## 2018-06-02 ENCOUNTER — Ambulatory Visit
Admission: RE | Admit: 2018-06-02 | Discharge: 2018-06-02 | Disposition: A | Discharge status: Home / Self Care | Payer: Self-pay | Source: Ambulatory Visit | Attending: Internal Medicine | Admitting: Internal Medicine

## 2018-06-02 DIAGNOSIS — K7460 Unspecified cirrhosis of liver: Secondary | ICD-10-CM

## 2018-06-13 ENCOUNTER — Other Ambulatory Visit: Payer: Self-pay | Admitting: Physical Medicine and Rehabilitation

## 2018-06-13 DIAGNOSIS — J449 Chronic obstructive pulmonary disease, unspecified: Secondary | ICD-10-CM

## 2018-06-19 ENCOUNTER — Other Ambulatory Visit: Payer: Self-pay | Admitting: Nurse Practitioner

## 2018-06-19 DIAGNOSIS — F17218 Nicotine dependence, cigarettes, with other nicotine-induced disorders: Secondary | ICD-10-CM

## 2018-06-19 DIAGNOSIS — I159 Secondary hypertension, unspecified: Secondary | ICD-10-CM

## 2018-06-19 DIAGNOSIS — I10 Essential (primary) hypertension: Secondary | ICD-10-CM

## 2018-06-19 MED FILL — !VENTOLIN HFA INHALER: 108 (90 BAS | 25 days supply | Qty: 18 | Fill #0

## 2018-06-19 MED FILL — !BREO ELLIPTA 200-25 MCG: 200-25 | 30 days supply | Qty: 60 | Fill #6

## 2018-06-19 MED FILL — POTASSIUM CL 10 MEQ TAB SA: 10 | 30 days supply | Qty: 30 | Fill #2

## 2018-06-19 MED FILL — ?CHLORTHALIDONE 25 MG TABLE: 25 | 30 days supply | Qty: 30 | Fill #0

## 2018-06-19 MED FILL — ?CARVEDILOL 3.125 MG TABLET: 3.125 | 30 days supply | Qty: 60 | Fill #0

## 2018-06-20 ENCOUNTER — Encounter: Payer: Self-pay | Admitting: Nurse Practitioner

## 2018-06-25 DIAGNOSIS — I10 Essential (primary) hypertension: Secondary | ICD-10-CM

## 2018-06-26 ENCOUNTER — Ambulatory Visit: Payer: Disability Insurance | Attending: Physical Medicine and Rehabilitation

## 2018-06-26 DIAGNOSIS — K7460 Unspecified cirrhosis of liver: Secondary | ICD-10-CM | POA: Diagnosis not present

## 2018-06-26 DIAGNOSIS — G4730 Sleep apnea, unspecified: Secondary | ICD-10-CM | POA: Diagnosis not present

## 2018-06-26 DIAGNOSIS — B182 Chronic viral hepatitis C: Secondary | ICD-10-CM | POA: Insufficient documentation

## 2018-06-26 DIAGNOSIS — R030 Elevated blood-pressure reading, without diagnosis of hypertension: Secondary | ICD-10-CM | POA: Diagnosis not present

## 2018-06-26 DIAGNOSIS — J449 Chronic obstructive pulmonary disease, unspecified: Secondary | ICD-10-CM | POA: Insufficient documentation

## 2018-06-26 DIAGNOSIS — M069 Rheumatoid arthritis, unspecified: Secondary | ICD-10-CM | POA: Insufficient documentation

## 2018-07-02 ENCOUNTER — Ambulatory Visit: Payer: No Typology Code available for payment source

## 2018-07-21 MED FILL — ?CARVEDILOL 3.125 MG TABLET: 3.125 | 30 days supply | Qty: 60 | Fill #1

## 2018-07-21 MED FILL — POTASSIUM CL 10 MEQ TAB SA: 10 | 30 days supply | Qty: 30 | Fill #3

## 2018-07-21 MED FILL — ?CHLORTHALIDONE 25 MG TABLE: 25 | 30 days supply | Qty: 30 | Fill #1

## 2018-07-22 ENCOUNTER — Ambulatory Visit: Payer: Self-pay | Attending: Nurse Practitioner | Admitting: Nurse Practitioner

## 2018-07-22 ENCOUNTER — Encounter: Payer: Self-pay | Admitting: Nurse Practitioner

## 2018-07-22 VITALS — BP 118/73 | HR 77 | Temp 98.4°F | Ht 70.0 in | Wt 254.8 lb

## 2018-07-22 DIAGNOSIS — J449 Chronic obstructive pulmonary disease, unspecified: Secondary | ICD-10-CM

## 2018-07-22 DIAGNOSIS — B1920 Unspecified viral hepatitis C without hepatic coma: Secondary | ICD-10-CM | POA: Insufficient documentation

## 2018-07-22 DIAGNOSIS — Z79899 Other long term (current) drug therapy: Secondary | ICD-10-CM | POA: Insufficient documentation

## 2018-07-22 DIAGNOSIS — I10 Essential (primary) hypertension: Secondary | ICD-10-CM

## 2018-07-22 DIAGNOSIS — R109 Unspecified abdominal pain: Secondary | ICD-10-CM

## 2018-07-22 DIAGNOSIS — G4733 Obstructive sleep apnea (adult) (pediatric): Secondary | ICD-10-CM

## 2018-07-22 DIAGNOSIS — Z886 Allergy status to analgesic agent status: Secondary | ICD-10-CM | POA: Insufficient documentation

## 2018-07-22 DIAGNOSIS — N529 Male erectile dysfunction, unspecified: Secondary | ICD-10-CM

## 2018-07-22 DIAGNOSIS — F17200 Nicotine dependence, unspecified, uncomplicated: Secondary | ICD-10-CM | POA: Insufficient documentation

## 2018-07-22 DIAGNOSIS — M2550 Pain in unspecified joint: Secondary | ICD-10-CM

## 2018-07-22 DIAGNOSIS — M1990 Unspecified osteoarthritis, unspecified site: Secondary | ICD-10-CM | POA: Insufficient documentation

## 2018-07-22 MED ORDER — DICLOFENAC SODIUM 1 % TD GEL: 4.0000 g | Freq: Four times a day (QID) | TRANSDERMAL | 1 refills | Status: DC

## 2018-07-22 MED ORDER — CHLORTHALIDONE 25 MG PO TABS: 25.0000 mg | ORAL_TABLET | Freq: Every day | ORAL | 0 refills | Status: DC

## 2018-07-22 MED ORDER — ALBUTEROL SULFATE (2.5 MG/3ML) 0.083% IN NEBU: 2.5000 mg | INHALATION_SOLUTION | Freq: Four times a day (QID) | RESPIRATORY_TRACT | 12 refills | Status: DC | PRN

## 2018-07-22 MED ORDER — CARVEDILOL 3.125 MG PO TABS: 3.1250 mg | ORAL_TABLET | Freq: Two times a day (BID) | ORAL | 1 refills | Status: DC

## 2018-07-22 MED ORDER — TADALAFIL 20 MG PO TABS: 10.0000 mg | ORAL_TABLET | ORAL | 1 refills | Status: DC | PRN

## 2018-07-22 MED FILL — !BREO ELLIPTA 200-25 MCG: 200-25 | 30 days supply | Qty: 60 | Fill #1

## 2018-07-22 NOTE — Progress Notes (Signed)
Assessment & Plan:  Levi Alvarez was seen today for follow-up.  Diagnoses and all orders for this visit:  Essential hypertension -     chlorthalidone (HYGROTON) 25 MG tablet; Take 1 tablet (25 mg total) by mouth daily. -     carvedilol (COREG) 3.125 MG tablet; Take 1 tablet (3.125 mg total) by mouth 2 (two) times daily. Continue all antihypertensives as prescribed.  Remember to bring in your blood pressure log with you for your follow up appointment.  DASH/Mediterranean Diets are healthier choices for HTN.   Right flank pain -     Urinalysis, Complete -     Microscopic Examination   Arthralgia of multiple joints -     diclofenac sodium (VOLTAREN) 1 % GEL; Apply 4 g topically 4 (four) times daily. Work on losing weight to help reduce joint pain. May alternate with heat and ice application for pain relief. May also alternate with acetaminophen and Ibuprofen as prescribed pain relief. Other alternatives include massage, acupuncture and water aerobics.  You must stay active and avoid a sedentary lifestyle. Work on losing weight to help reduce joint pain. May alternate with heat and ice application for pain relief. May also alternate with acetaminophen as prescribed for pain relief. Other alternatives include massage, acupuncture and water aerobics.  You must stay active and avoid a sedentary lifestyle.  OSA (obstructive sleep apnea) -     Cpap titration; Future  Erectile dysfunction, unspecified erectile dysfunction type -     tadalafil (ADCIRCA/CIALIS) 20 MG tablet; Take 0.5-1 tablets (10-20 mg total) by mouth every other day as needed for erectile dysfunction.   Chronic obstructive pulmonary disease, unspecified COPD type (HCC) -     albuterol (PROVENTIL) (2.5 MG/3ML) 0.083% nebulizer solution; Take 3 mLs (2.5 mg total) by nebulization every 6 (six) hours as needed for wheezing or shortness of breath.   Patient has been counseled on age-appropriate routine health concerns for screening  and prevention. These are reviewed and up-to-date. Referrals have been placed accordingly. Immunizations are up-to-date or declined.    Subjective:   Chief Complaint  Patient presents with  . Follow-up    Pt. is here to follow-up.    HPI Levi Alvarez 47 y.o. male presents to office today for follow up. He is currently being evaluated and treated for Hep C by ID.   CHRONIC HYPERTENSION Disease Monitoring  Blood pressure range BP Readings from Last 3 Encounters:  07/22/18 118/73  05/14/18 128/85  04/21/18 (!) 141/89  Well controlled today.   Chest pain: no   Dyspnea: yes; continues to smoke. Not ready to quit  Claudication: no  Medication compliance: yes  Medication Side Effects  Lightheadedness: no   Urinary frequency: no   Edema: no   Impotence: yes  Preventitive Healthcare:  Exercise: no   Diet Pattern: diet: general  Salt Restriction:  no   Erectile Dysfunction: Patient complains of erectile dysfunction.  Onset of dysfunction was several months ago and was gradual in onset.  Patient states the nature of difficulty is both attaining and maintaining erection. Full erections occur never. Partial erections occur with intercourse. Libido is not affected. Risk factors for ED include cardiovascular disease, beta blocker use and antihypertensive medications. Patient denies history of diabetes mellitus, cranial, spinal, or pelvic trauma, pelvic radiation and hypogonadism. = Previous treatment of ED includes NONE.    Right Flank Pain He endorses a fall several weeks ago after falling down a set of stairs at home. He fell on  his backside and currently endorses right flank pain. He did not lose consciousness or hit his head. States he sustained multiple bruises all of which have resolved today. He denies any hematuria or dysuria however will obtain urine sample to evaluate for any GU abnormalities.    Review of Systems  Constitutional: Negative for fever, malaise/fatigue and weight  loss.  HENT: Negative.  Negative for nosebleeds.   Eyes: Negative.  Negative for blurred vision, double vision and photophobia.  Respiratory: Positive for cough and shortness of breath. Negative for wheezing.   Cardiovascular: Negative.  Negative for chest pain, palpitations and leg swelling.  Gastrointestinal: Negative.  Negative for heartburn, nausea and vomiting.  Genitourinary: Positive for flank pain. Negative for dysuria, frequency, hematuria and urgency.       Erectile dysfunction  Musculoskeletal: Positive for back pain, falls and joint pain. Negative for myalgias.       He has chronic pain. Unable to refill tramadol. He smokes marijuana  Neurological: Negative.  Negative for dizziness, focal weakness, seizures and headaches.  Psychiatric/Behavioral: Negative.  Negative for suicidal ideas.    Past Medical History:  Diagnosis Date  . Hepatitis C antibody positive in blood   . High blood pressure     Past Surgical History:  Procedure Laterality Date  . arm surgery    . KNEE SURGERY      Family History  Problem Relation Age of Onset  . Colitis Mother   . Arthritis Mother   . Cancer Father        lymphoma?  . Cancer Paternal Grandfather        bone  . Heart disease Neg Hx   . Stroke Neg Hx   . Diabetes Neg Hx     Social History Reviewed with no changes to be made today.   Outpatient Medications Prior to Visit  Medication Sig Dispense Refill  . fluticasone furoate-vilanterol (BREO ELLIPTA) 200-25 MCG/INH AEPB Inhale 1 puff into the lungs daily. 1 each 6  . Multiple Vitamin (MULTI-VITAMINS) TABS Take 1 tablet by mouth 1 day or 1 dose.    . potassium chloride (K-DUR) 10 MEQ tablet Take 1 tablet (10 mEq total) by mouth daily. 60 tablet 1  . traMADol (ULTRAM) 50 MG tablet Take 1 tablet (50 mg total) by mouth every 8 (eight) hours as needed. 30 tablet 0  . VENTOLIN HFA 108 (90 Base) MCG/ACT inhaler INHALE 1 PUFF EVERY 6 (SIX) HOURS AS NEEDED INTO THE LUNGS FOR WHEEZING OR  SHORTNESS OF BREATH. 18 g 1  . albuterol (PROVENTIL) (2.5 MG/3ML) 0.083% nebulizer solution Take 3 mLs (2.5 mg total) by nebulization every 6 (six) hours as needed for wheezing or shortness of breath. 75 mL 12  . diclofenac sodium (VOLTAREN) 1 % GEL Apply 4 g topically 4 (four) times daily. 100 g 1  . BREO ELLIPTA 200-25 MCG/INH AEPB INHALE 1 PUFF INTO THE LUNGS DAILY  6  . cyclobenzaprine (FLEXERIL) 10 MG tablet Take 1 tablet (10 mg total) by mouth 3 (three) times daily as needed for muscle spasms. (Patient not taking: Reported on 07/22/2018) 60 tablet 1  . carvedilol (COREG) 3.125 MG tablet TAKE 1 TABLET (3.125 MG TOTAL) BY MOUTH 2 (TWO) TIMES DAILY. 60 tablet 1  . chlorthalidone (HYGROTON) 25 MG tablet Take 1 tablet (25 mg total) by mouth daily. 90 tablet 0   Facility-Administered Medications Prior to Visit  Medication Dose Route Frequency Provider Last Rate Last Dose  . 0.9 %  sodium chloride  infusion  500 mL Intravenous Continuous Armbruster, Carlota Raspberry, MD        Allergies  Allergen Reactions  . Naproxen Hives and Swelling       Objective:    BP 118/73 (BP Location: Left Arm, Patient Position: Sitting, Cuff Size: Large)   Pulse 77   Temp 98.4 F (36.9 C) (Oral)   Ht 5\' 10"  (1.778 m)   Wt 254 lb 12.8 oz (115.6 kg)   SpO2 98%   BMI 36.56 kg/m  Wt Readings from Last 3 Encounters:  07/22/18 254 lb 12.8 oz (115.6 kg)  05/14/18 236 lb (107 kg)  04/21/18 243 lb (110.2 kg)    Physical Exam  Constitutional: He is oriented to person, place, and time. He appears well-developed and well-nourished. He is cooperative.  HENT:  Head: Normocephalic and atraumatic.  Eyes: EOM are normal.  Neck: Normal range of motion.  Cardiovascular: Normal rate, regular rhythm and normal heart sounds. Exam reveals no gallop and no friction rub.  No murmur heard. Pulmonary/Chest: Effort normal. No tachypnea. No respiratory distress. He has no decreased breath sounds. He has no wheezes. He has rhonchi in  the right middle field, the right lower field, the left middle field and the left lower field. He has no rales. He exhibits no tenderness.  Abdominal: Soft. Bowel sounds are normal. There is no CVA tenderness.  Musculoskeletal: Normal range of motion. He exhibits no edema.  Neurological: He is alert and oriented to person, place, and time. Coordination normal.  Skin: Skin is warm and dry.  Psychiatric: He has a normal mood and affect. His behavior is normal. Judgment and thought content normal.  Nursing note and vitals reviewed.      Patient has been counseled extensively about nutrition and exercise as well as the importance of adherence with medications and regular follow-up. The patient was given clear instructions to go to ER or return to medical center if symptoms don't improve, worsen or new problems develop. The patient verbalized understanding.   Follow-up: Return in about 3 months (around 10/21/2018).   Gildardo Pounds, FNP-BC [facility] and [place], [place]   07/23/2018, 11:27 PM

## 2018-07-23 ENCOUNTER — Encounter: Payer: Self-pay | Admitting: Nurse Practitioner

## 2018-07-23 LAB — URINALYSIS, COMPLETE
BILIRUBIN UA: NEGATIVE
Glucose, UA: NEGATIVE
KETONES UA: NEGATIVE
Leukocytes, UA: NEGATIVE
NITRITE UA: NEGATIVE
PROTEIN UA: NEGATIVE
RBC, UA: NEGATIVE
SPEC GRAV UA: 1.009 (ref 1.005–1.030)
Urobilinogen, Ur: 0.2 mg/dL (ref 0.2–1.0)
pH, UA: 7.5 (ref 5.0–7.5)

## 2018-07-23 LAB — MICROSCOPIC EXAMINATION: Casts: NONE SEEN /lpf

## 2018-07-23 MED FILL — ?TADALAFIL (PAH) 20 MG TABS: 20 | 30 days supply | Qty: 10 | Fill #0

## 2018-07-23 MED FILL — ?ALBUTEROL SUL 2.5 MG/3 MLS: (2.5 MG/3ML | 8 days supply | Qty: 90 | Fill #0

## 2018-07-23 MED FILL — DICLOFENAC SODIUM 1% GEL: 1 | 6 days supply | Qty: 100 | Fill #0

## 2018-07-25 ENCOUNTER — Telehealth: Payer: Self-pay | Admitting: Nurse Practitioner

## 2018-07-25 ENCOUNTER — Other Ambulatory Visit: Payer: Self-pay | Admitting: Nurse Practitioner

## 2018-07-25 DIAGNOSIS — G4733 Obstructive sleep apnea (adult) (pediatric): Secondary | ICD-10-CM

## 2018-07-25 NOTE — Telephone Encounter (Signed)
Split night sleep study has been ordered

## 2018-07-25 NOTE — Telephone Encounter (Signed)
Terry from [facility] is calling to get a new order for the sleep study (split night) due to the last sleep study being done over a year ago leading to him to start over. Please follow up with terry.

## 2018-07-25 NOTE — Telephone Encounter (Signed)
Will route to PCP 

## 2018-08-05 ENCOUNTER — Encounter: Payer: Self-pay | Admitting: Nurse Practitioner

## 2018-08-05 ENCOUNTER — Ambulatory Visit: Payer: No Typology Code available for payment source | Attending: Nurse Practitioner | Admitting: Nurse Practitioner

## 2018-08-05 VITALS — BP 120/80 | HR 73 | Temp 98.9°F | Ht 70.0 in | Wt 253.6 lb

## 2018-08-05 DIAGNOSIS — Z79899 Other long term (current) drug therapy: Secondary | ICD-10-CM | POA: Insufficient documentation

## 2018-08-05 DIAGNOSIS — Z886 Allergy status to analgesic agent status: Secondary | ICD-10-CM | POA: Insufficient documentation

## 2018-08-05 DIAGNOSIS — G894 Chronic pain syndrome: Secondary | ICD-10-CM | POA: Insufficient documentation

## 2018-08-05 DIAGNOSIS — M546 Pain in thoracic spine: Secondary | ICD-10-CM

## 2018-08-05 DIAGNOSIS — I10 Essential (primary) hypertension: Secondary | ICD-10-CM

## 2018-08-05 DIAGNOSIS — G8929 Other chronic pain: Secondary | ICD-10-CM

## 2018-08-05 DIAGNOSIS — M7989 Other specified soft tissue disorders: Secondary | ICD-10-CM | POA: Insufficient documentation

## 2018-08-05 MED ORDER — LIDOCAINE 5 % EX PTCH: 1.0000 | MEDICATED_PATCH | CUTANEOUS | 1 refills | Status: DC

## 2018-08-05 MED FILL — ?LIDOCAINE 5% PATCH: 5 | 30 days supply | Qty: 30 | Fill #0
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
## 2018-08-06 LAB — CMP14+EGFR
A/G RATIO: 1 — AB (ref 1.2–2.2)
ALK PHOS: 126 IU/L — AB (ref 39–117)
ALT: 73 IU/L — AB (ref 0–44)
AST: 188 IU/L — ABNORMAL HIGH (ref 0–40)
Albumin: 4 g/dL (ref 3.5–5.5)
BILIRUBIN TOTAL: 0.9 mg/dL (ref 0.0–1.2)
BUN / CREAT RATIO: 5 — AB (ref 9–20)
BUN: 4 mg/dL — ABNORMAL LOW (ref 6–24)
CHLORIDE: 92 mmol/L — AB (ref 96–106)
CO2: 27 mmol/L (ref 20–29)
Calcium: 9.3 mg/dL (ref 8.7–10.2)
Creatinine, Ser: 0.84 mg/dL (ref 0.76–1.27)
GFR calc non Af Amer: 105 mL/min/{1.73_m2} (ref >59)
GFR, EST AFRICAN AMERICAN: 121 mL/min/{1.73_m2} (ref >59)
Globulin, Total: 4.1 g/dL (ref 1.5–4.5)
Glucose: 112 mg/dL — ABNORMAL HIGH (ref 65–99)
POTASSIUM: 2.8 mmol/L — AB (ref 3.5–5.2)
Sodium: 135 mmol/L (ref 134–144)
TOTAL PROTEIN: 8.1 g/dL (ref 6.0–8.5)

## 2018-08-07 ENCOUNTER — Encounter: Payer: Self-pay | Admitting: Nurse Practitioner

## 2018-08-07 ENCOUNTER — Telehealth: Payer: Self-pay

## 2018-08-07 MED ORDER — SPIRONOLACTONE 50 MG PO TABS: 50.0000 mg | ORAL_TABLET | Freq: Every day | ORAL | 1 refills | Status: DC

## 2018-08-07 MED ORDER — POTASSIUM CHLORIDE ER 20 MEQ PO TBCR: 10.0000 meq | EXTENDED_RELEASE_TABLET | Freq: Every day | ORAL | 0 refills | Status: DC

## 2018-08-07 MED FILL — ?SPIRONOLACTON 50MG TABLET: 50 | 30 days supply | Qty: 30 | Fill #0

## 2018-08-07 NOTE — Telephone Encounter (Signed)
CMA attempt to reach patient to inform on results.  No answer and left a VM for patient to call back.  If patient call back please inform:  Potassium is low. I would like for you to stop taking chlorthalidone and I have sent potassium for you to take for the next few weeks. Please make a lab appointment in 2 weeks for potassium to be rechecked. After your potassium has been rechecked I will need to start you on another blood pressure medication. Please start taking this medication after you have finished your potassium and had lab work performed. The blood pressure medication has been sent to the pharmacy.          A letter will be send out to reach patient.

## 2018-08-07 NOTE — Telephone Encounter (Signed)
-----   Message from Gildardo Pounds, NP sent at 08/07/2018  1:10 AM EDT ----- Potassium is low. I would like for you to stop taking chlorthalidone and I have sent potassium for you to take for the next few weeks. Please make a lab appointment in 2 weeks for potassium to be rechecked. After your potassium has been rechecked I will need to start you on another blood pressure medication. Please start taking this medication after you have finished your potassium and had lab work performed. The blood pressure medication has been sent to the pharmacy.

## 2018-08-08 ENCOUNTER — Other Ambulatory Visit: Payer: Self-pay | Admitting: Nurse Practitioner

## 2018-08-08 DIAGNOSIS — I10 Essential (primary) hypertension: Secondary | ICD-10-CM

## 2018-08-08 MED ORDER — POTASSIUM CHLORIDE ER 20 MEQ PO TBCR: 20.0000 meq | EXTENDED_RELEASE_TABLET | Freq: Every day | ORAL | 0 refills | Status: DC

## 2018-08-08 MED FILL — POTASSIUM CL ER 20 MEQ TABL: 20 | 14 days supply | Qty: 14 | Fill #0

## 2018-08-14 ENCOUNTER — Ambulatory Visit: Payer: No Typology Code available for payment source

## 2018-08-27 ENCOUNTER — Ambulatory Visit: Payer: Self-pay | Attending: Nurse Practitioner | Admitting: *Deleted

## 2018-08-27 ENCOUNTER — Ambulatory Visit (HOSPITAL_COMMUNITY)
Admission: RE | Admit: 2018-08-27 | Discharge: 2018-08-27 | Disposition: A | Discharge status: Home / Self Care | Payer: No Typology Code available for payment source | Source: Ambulatory Visit | Attending: Nurse Practitioner | Admitting: Nurse Practitioner

## 2018-08-27 DIAGNOSIS — G8929 Other chronic pain: Secondary | ICD-10-CM | POA: Insufficient documentation

## 2018-08-27 DIAGNOSIS — M7989 Other specified soft tissue disorders: Secondary | ICD-10-CM | POA: Insufficient documentation

## 2018-08-27 DIAGNOSIS — M546 Pain in thoracic spine: Secondary | ICD-10-CM | POA: Insufficient documentation

## 2018-08-27 LAB — POCT ABI - SCREENING FOR PILOT NO CHARGE
IMABIR: 1.39
Immediate ABI left: 1.43

## 2018-08-27 NOTE — Progress Notes (Signed)
Patient presented with bilateral swelling in legs and ankles. Patient will continue current medications and is aware of referral being placed to vein and vascular.

## 2018-08-28 ENCOUNTER — Other Ambulatory Visit: Payer: Self-pay | Admitting: Nurse Practitioner

## 2018-08-28 DIAGNOSIS — R6889 Other general symptoms and signs: Secondary | ICD-10-CM

## 2018-08-29 ENCOUNTER — Other Ambulatory Visit: Payer: Self-pay

## 2018-08-30 ENCOUNTER — Encounter (HOSPITAL_BASED_OUTPATIENT_CLINIC_OR_DEPARTMENT_OTHER): Payer: No Typology Code available for payment source

## 2018-09-03 ENCOUNTER — Ambulatory Visit (HOSPITAL_BASED_OUTPATIENT_CLINIC_OR_DEPARTMENT_OTHER): Payer: No Typology Code available for payment source | Attending: Nurse Practitioner

## 2018-09-08 ENCOUNTER — Other Ambulatory Visit: Payer: Self-pay | Admitting: Nurse Practitioner

## 2018-09-08 DIAGNOSIS — J449 Chronic obstructive pulmonary disease, unspecified: Secondary | ICD-10-CM

## 2018-09-08 DIAGNOSIS — I10 Essential (primary) hypertension: Secondary | ICD-10-CM

## 2018-09-08 MED FILL — ?SPIRONOLACTON 50MG TABLET: 50 | 30 days supply | Qty: 30 | Fill #1

## 2018-09-08 MED FILL — ?TADALAFIL (PAH) 20 MG TABS: 20 | 30 days supply | Qty: 10 | Fill #1

## 2018-09-08 MED FILL — POTASSIUM CL ER 20 MEQ TABL: 20 | 14 days supply | Qty: 14 | Fill #0

## 2018-09-08 MED FILL — ?CARVEDILOL 3.125 MG TABLET: 3.125 | 30 days supply | Qty: 60 | Fill #0

## 2018-09-09 NOTE — Telephone Encounter (Signed)
REFILL request via mychart for carvedilol

## 2018-09-10 ENCOUNTER — Encounter: Payer: Medicaid Other | Admitting: Medical

## 2018-09-10 ENCOUNTER — Ambulatory Visit: Payer: No Typology Code available for payment source | Admitting: Nurse Practitioner

## 2018-09-11 MED ORDER — CARVEDILOL 3.125 MG PO TABS: 3.1250 mg | ORAL_TABLET | Freq: Two times a day (BID) | ORAL | 1 refills | Status: DC

## 2018-09-26 ENCOUNTER — Ambulatory Visit: Payer: MEDICAID | Attending: Nurse Practitioner | Admitting: Nurse Practitioner

## 2018-09-26 ENCOUNTER — Encounter: Payer: Self-pay | Admitting: Nurse Practitioner

## 2018-09-26 VITALS — BP 147/72 | HR 64 | Temp 98.1°F | Ht 70.0 in | Wt 252.0 lb

## 2018-09-26 DIAGNOSIS — G8929 Other chronic pain: Secondary | ICD-10-CM

## 2018-09-26 DIAGNOSIS — G894 Chronic pain syndrome: Secondary | ICD-10-CM | POA: Insufficient documentation

## 2018-09-26 DIAGNOSIS — M546 Pain in thoracic spine: Secondary | ICD-10-CM

## 2018-09-26 DIAGNOSIS — Z79899 Other long term (current) drug therapy: Secondary | ICD-10-CM | POA: Insufficient documentation

## 2018-09-26 DIAGNOSIS — F1290 Cannabis use, unspecified, uncomplicated: Secondary | ICD-10-CM | POA: Insufficient documentation

## 2018-09-26 DIAGNOSIS — E876 Hypokalemia: Secondary | ICD-10-CM | POA: Insufficient documentation

## 2018-09-26 DIAGNOSIS — Z886 Allergy status to analgesic agent status: Secondary | ICD-10-CM | POA: Insufficient documentation

## 2018-09-26 MED ORDER — POTASSIUM CHLORIDE ER 20 MEQ PO TBCR: 20.0000 meq | EXTENDED_RELEASE_TABLET | Freq: Every day | ORAL | 0 refills | Status: DC

## 2018-09-26 MED ORDER — POTASSIUM CHLORIDE ER 20 MEQ PO TBCR: 20.0000 meq | EXTENDED_RELEASE_TABLET | Freq: Every day | ORAL | 2 refills | Status: DC

## 2018-09-26 MED ORDER — DULOXETINE HCL 60 MG PO CPEP: 60.0000 mg | ORAL_CAPSULE | Freq: Every day | ORAL | 1 refills | Status: DC

## 2018-09-26 MED FILL — POTASSIUM CL ER 20 MEQ TAB: 20 | 14 days supply | Qty: 14 | Fill #0

## 2018-09-26 MED FILL — DULoxetine HCL 60 MG CPEP: 60 | 30 days supply | Qty: 30 | Fill #0
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
## 2018-09-27 ENCOUNTER — Encounter: Payer: Self-pay | Admitting: Nurse Practitioner

## 2018-09-27 LAB — CMP14+EGFR
ALT: 55 IU/L — ABNORMAL HIGH (ref 0–44)
AST: 150 IU/L — ABNORMAL HIGH (ref 0–40)
Albumin/Globulin Ratio: 1.2 (ref 1.2–2.2)
Albumin: 4 g/dL (ref 3.5–5.5)
Alkaline Phosphatase: 103 IU/L (ref 39–117)
BUN/Creatinine Ratio: 7 — ABNORMAL LOW (ref 9–20)
BUN: 5 mg/dL — AB (ref 6–24)
Bilirubin Total: 0.8 mg/dL (ref 0.0–1.2)
CHLORIDE: 101 mmol/L (ref 96–106)
CO2: 23 mmol/L (ref 20–29)
CREATININE: 0.74 mg/dL — AB (ref 0.76–1.27)
Calcium: 8.7 mg/dL (ref 8.7–10.2)
GFR calc Af Amer: 128 mL/min/{1.73_m2} (ref >59)
GFR, EST NON AFRICAN AMERICAN: 111 mL/min/{1.73_m2} (ref >59)
GLUCOSE: 110 mg/dL — AB (ref 65–99)
Globulin, Total: 3.4 g/dL (ref 1.5–4.5)
Potassium: 4.1 mmol/L (ref 3.5–5.2)
Sodium: 138 mmol/L (ref 134–144)
Total Protein: 7.4 g/dL (ref 6.0–8.5)

## 2018-10-02 ENCOUNTER — Encounter: Payer: Self-pay | Admitting: Nurse Practitioner

## 2018-10-08 ENCOUNTER — Other Ambulatory Visit: Payer: Self-pay | Admitting: Nurse Practitioner

## 2018-10-08 MED ORDER — AMITRIPTYLINE HCL 75 MG PO TABS: 75.0000 mg | ORAL_TABLET | Freq: Two times a day (BID) | ORAL | 0 refills | Status: DC

## 2018-10-08 MED FILL — ?AMITRIPTYLINE HCL 75 MG TA: 75 | 30 days supply | Qty: 60 | Fill #0

## 2018-10-14 ENCOUNTER — Encounter: Payer: No Typology Code available for payment source | Admitting: Vascular Surgery

## 2018-10-15 MED FILL — !BREO ELLIPTA 200-25 MCG: 200-25 | 30 days supply | Qty: 60 | Fill #0

## 2018-10-15 MED FILL — ?CARVEDILOL 3.125 MG TABLET: 3.125 | 30 days supply | Qty: 60 | Fill #1

## 2018-10-15 MED FILL — !VENTOLIN HFA INHALER: 108 (90 BAS | 25 days supply | Qty: 18 | Fill #1

## 2018-10-17 ENCOUNTER — Other Ambulatory Visit: Payer: Self-pay

## 2018-10-17 MED ORDER — SPIRONOLACTONE 50 MG PO TABS: 50.0000 mg | ORAL_TABLET | Freq: Every day | ORAL | 2 refills | Status: DC

## 2018-10-17 MED FILL — ?SPIRONOLACTONE 50 MG TABLE: 50 | 30 days supply | Qty: 30 | Fill #0

## 2018-10-21 ENCOUNTER — Ambulatory Visit (INDEPENDENT_AMBULATORY_CARE_PROVIDER_SITE_OTHER): Payer: Self-pay

## 2018-10-21 ENCOUNTER — Encounter (INDEPENDENT_AMBULATORY_CARE_PROVIDER_SITE_OTHER): Payer: Self-pay | Admitting: Orthopaedic Surgery

## 2018-10-21 ENCOUNTER — Ambulatory Visit (INDEPENDENT_AMBULATORY_CARE_PROVIDER_SITE_OTHER): Payer: Self-pay | Admitting: Orthopaedic Surgery

## 2018-10-21 ENCOUNTER — Ambulatory Visit: Payer: No Typology Code available for payment source | Admitting: Nurse Practitioner

## 2018-10-21 VITALS — BP 110/67 | HR 70 | Ht 70.0 in | Wt 245.0 lb

## 2018-10-21 DIAGNOSIS — M25562 Pain in left knee: Secondary | ICD-10-CM

## 2018-10-21 DIAGNOSIS — M1712 Unilateral primary osteoarthritis, left knee: Secondary | ICD-10-CM

## 2018-10-21 DIAGNOSIS — M542 Cervicalgia: Secondary | ICD-10-CM

## 2018-10-21 DIAGNOSIS — M545 Low back pain, unspecified: Secondary | ICD-10-CM

## 2018-10-21 DIAGNOSIS — M5416 Radiculopathy, lumbar region: Secondary | ICD-10-CM

## 2018-10-21 DIAGNOSIS — G8929 Other chronic pain: Secondary | ICD-10-CM

## 2018-10-21 DIAGNOSIS — M5412 Radiculopathy, cervical region: Secondary | ICD-10-CM

## 2018-10-21 DIAGNOSIS — M5030 Other cervical disc degeneration, unspecified cervical region: Secondary | ICD-10-CM
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
## 2018-10-22 ENCOUNTER — Other Ambulatory Visit (INDEPENDENT_AMBULATORY_CARE_PROVIDER_SITE_OTHER): Payer: Self-pay | Admitting: Surgery

## 2018-10-22 DIAGNOSIS — M5440 Lumbago with sciatica, unspecified side: Secondary | ICD-10-CM

## 2018-10-22 DIAGNOSIS — M542 Cervicalgia: Secondary | ICD-10-CM

## 2018-10-30 ENCOUNTER — Ambulatory Visit (HOSPITAL_COMMUNITY)
Admission: RE | Admit: 2018-10-30 | Discharge: 2018-10-30 | Disposition: A | Discharge status: Home / Self Care | Payer: No Typology Code available for payment source | Source: Ambulatory Visit | Attending: Surgery | Admitting: Surgery

## 2018-10-30 ENCOUNTER — Telehealth (INDEPENDENT_AMBULATORY_CARE_PROVIDER_SITE_OTHER): Payer: Self-pay | Admitting: Orthopaedic Surgery

## 2018-10-30 ENCOUNTER — Encounter (HOSPITAL_COMMUNITY): Payer: Self-pay

## 2018-10-30 ENCOUNTER — Ambulatory Visit (HOSPITAL_BASED_OUTPATIENT_CLINIC_OR_DEPARTMENT_OTHER): Payer: No Typology Code available for payment source | Attending: Nurse Practitioner | Admitting: Internal Medicine

## 2018-10-30 DIAGNOSIS — M5416 Radiculopathy, lumbar region: Secondary | ICD-10-CM | POA: Insufficient documentation

## 2018-10-30 DIAGNOSIS — M542 Cervicalgia: Secondary | ICD-10-CM | POA: Insufficient documentation

## 2018-10-30 DIAGNOSIS — R0902 Hypoxemia: Secondary | ICD-10-CM | POA: Insufficient documentation

## 2018-10-30 DIAGNOSIS — G4733 Obstructive sleep apnea (adult) (pediatric): Secondary | ICD-10-CM | POA: Insufficient documentation

## 2018-10-30 NOTE — Telephone Encounter (Signed)
Patient's girlfriend called stating that the patient was not able to complete the MRI's today due to claustrophobia and also needs medication to help him relax.  He would like to be in the bigger machine.  CB#[phone].  Thank you.

## 2018-10-30 NOTE — Progress Notes (Signed)
Pt extremely claustrophobic. Pt will need wide bore scanner and possible oral sedation. Pt given instructions on how to proceed about getting this rescheduled.

## 2018-10-31 ENCOUNTER — Ambulatory Visit: Payer: No Typology Code available for payment source | Admitting: Nurse Practitioner

## 2018-10-31 NOTE — Telephone Encounter (Signed)
Ok for valium? 

## 2018-10-31 NOTE — Telephone Encounter (Signed)
Ok thanks . Valium 5 mg # 3 thanks

## 2018-11-03 ENCOUNTER — Telehealth (INDEPENDENT_AMBULATORY_CARE_PROVIDER_SITE_OTHER): Payer: Self-pay

## 2018-11-03 ENCOUNTER — Other Ambulatory Visit: Payer: Self-pay | Admitting: Nurse Practitioner

## 2018-11-03 DIAGNOSIS — N529 Male erectile dysfunction, unspecified: Secondary | ICD-10-CM

## 2018-11-03 MED ORDER — DIAZEPAM 5 MG PO TABS: ORAL_TABLET | ORAL | 0 refills | Status: DC

## 2018-11-03 MED FILL — ?TADALAFIL (PAH) 20 MG TABS: 20 | 30 days supply | Qty: 10 | Fill #0

## 2018-11-03 NOTE — Telephone Encounter (Signed)
I called and spoke with patient. Rx called [facility] per patient request.

## 2018-11-03 NOTE — Telephone Encounter (Signed)
I left voicemail for patient advising. Medication called to [facility]. I did explain instructions for taking med on voicemail.  Levi Alvarez, could you please check on having patient rescheduled? Thanks.

## 2018-11-03 NOTE — Telephone Encounter (Signed)
Levi Alvarez with [facility] called stating that they do not dispense for Valium and that Rx would need to be called into another pharmacy.  Please advise.  Thank you.

## 2018-11-03 NOTE — Addendum Note (Signed)
Addended by: Meyer Cory on: 11/03/2018 02:31 PM   Modules accepted: Orders

## 2018-11-10 NOTE — Telephone Encounter (Signed)
I emailed April P with centralized scheduling advising pt is claustrophobic and would need a bigger unit. They will ccontact pt to schedule appt

## 2018-11-11 ENCOUNTER — Ambulatory Visit (INDEPENDENT_AMBULATORY_CARE_PROVIDER_SITE_OTHER): Payer: Self-pay | Admitting: Orthopaedic Surgery

## 2018-11-12 DIAGNOSIS — K429 Umbilical hernia without obstruction or gangrene: Secondary | ICD-10-CM

## 2018-11-12 DIAGNOSIS — K4090 Unilateral inguinal hernia, without obstruction or gangrene, not specified as recurrent: Secondary | ICD-10-CM

## 2018-11-12 HISTORY — DX: Umbilical hernia without obstruction or gangrene: K42.9

## 2018-11-12 HISTORY — DX: Unilateral inguinal hernia, without obstruction or gangrene, not specified as recurrent: K40.90

## 2018-11-13 ENCOUNTER — Ambulatory Visit: Payer: Self-pay | Attending: Nurse Practitioner

## 2018-11-14 ENCOUNTER — Encounter (HOSPITAL_COMMUNITY): Payer: Self-pay

## 2018-11-14 ENCOUNTER — Ambulatory Visit (HOSPITAL_COMMUNITY)
Admission: RE | Admit: 2018-11-14 | Discharge: 2018-11-14 | Disposition: A | Discharge status: Home / Self Care | Payer: No Typology Code available for payment source | Source: Ambulatory Visit | Attending: Surgery | Admitting: Surgery

## 2018-11-14 ENCOUNTER — Ambulatory Visit (HOSPITAL_COMMUNITY): Admission: RE | Admit: 2018-11-14 | Payer: Self-pay | Source: Ambulatory Visit

## 2018-11-14 ENCOUNTER — Ambulatory Visit (HOSPITAL_COMMUNITY): Admission: RE | Admit: 2018-11-14 | Payer: No Typology Code available for payment source | Source: Ambulatory Visit

## 2018-11-14 ENCOUNTER — Other Ambulatory Visit (HOSPITAL_COMMUNITY): Payer: Self-pay | Admitting: Surgery

## 2018-11-14 DIAGNOSIS — M5410 Radiculopathy, site unspecified: Secondary | ICD-10-CM

## 2018-11-14 DIAGNOSIS — M5116 Intervertebral disc disorders with radiculopathy, lumbar region: Secondary | ICD-10-CM

## 2018-11-14 MED FILL — ?SPIRONOLACTONE 50 MG TABLE: 50 | 30 days supply | Qty: 30 | Fill #1

## 2018-11-14 MED FILL — !BREO ELLIPTA 200-25 MCG: 200-25 | 30 days supply | Qty: 60 | Fill #1

## 2018-11-14 MED FILL — ?CARVEDILOL 3.125 MG TABLET: 3.125 | 30 days supply | Qty: 60 | Fill #0

## 2018-11-15 DIAGNOSIS — G4733 Obstructive sleep apnea (adult) (pediatric): Secondary | ICD-10-CM

## 2018-11-15 NOTE — Procedures (Signed)
Patient Name: Levi Alvarez, Levi Alvarez Date: 10/30/2018 Gender: Male D.O.B: 06-05-1971 Age (years): 55 Referring Provider: Gildardo Pounds NP Height (inches): 74 Interpreting Physician: Baird Lyons MD, ABSM Weight (lbs): 230 RPSGT: Lanae Boast BMI: 35 MRN: [id number] Neck Size: 18.50  CLINICAL INFORMATION Sleep Study Type: NPSG Indication for sleep study: OSA Epworth Sleepiness Score: 14  Most recent polysomnogram dated 06/30/2017 revealed an AHI of 49.9/h and RDI of 49.9/h.  SLEEP STUDY TECHNIQUE As per the AASM Manual for the Scoring of Sleep and Associated Events v2.3 (April 2016) with a hypopnea requiring 4% desaturations.  The channels recorded and monitored were frontal, central and occipital EEG, electrooculogram (EOG), submentalis EMG (chin), nasal and oral airflow, thoracic and abdominal wall motion, anterior tibialis EMG, snore microphone, electrocardiogram, and pulse oximetry.  MEDICATIONS Medications self-administered by patient taken the night of the study : Tylenol PM  SLEEP ARCHITECTURE The study was initiated at 10:56:10 PM and ended at 5:11:30 AM.  Sleep onset time was 20.7 minutes and the sleep efficiency was 68.5%%. The total sleep time was 257 minutes.  Stage REM latency was 278.0 minutes.  The patient spent 14.4%% of the night in stage N1 sleep, 70.4%% in stage N2 sleep, 0.0%% in stage N3 and 15.2% in REM.  Alpha intrusion was absent.  Supine sleep was 10.49%.  RESPIRATORY PARAMETERS The overall apnea/hypopnea index (AHI) was 65.4 per hour. There were 254 total apneas, including 254 obstructive, 0 central and 0 mixed apneas. There were 26 hypopneas and 4 RERAs.  The AHI during Stage REM sleep was 58.5 per hour.  AHI while supine was 77.9 per hour.  The mean oxygen saturation was 91.1%. The minimum SpO2 during sleep was 78.0%.  loud snoring was noted during this study.  CARDIAC DATA The 2 lead EKG demonstrated sinus rhythm. The mean  heart rate was 68.9 beats per minute. Other EKG findings include: None.  LEG MOVEMENT DATA The total PLMS were 0 with a resulting PLMS index of 0.0. Associated arousal with leg movement index was 0.0 .  IMPRESSIONS - Severe obstructive sleep apnea occurred during this study (AHI = 65.4/h). - There were insufficient early events to me protocol criteria for split CPAP titration. - No significant central sleep apnea occurred during this study (CAI = 0.0/h). - Moderate oxygen desaturation was noted during this study (Min O2 = 78.0%). - The patient snored with loud snoring volume. - No cardiac abnormalities were noted during this study. - Clinically significant periodic limb movements did not occur during sleep. No significant associated arousals.  DIAGNOSIS - Obstructive Sleep Apnea (327.23 [G47.33 ICD-10]) - Nocturnal Hypoxemia (327.26 [G47.36 ICD-10])  RECOMMENDATIONS - Recommend CPAP titration sleep study or autopap. Other options would be based on clinical judgment. - Be careful with alcohol, sedatives and other CNS depressants that may worsen sleep apnea and disrupt normal sleep architecture. - Sleep hygiene should be reviewed to assess factors that may improve sleep quality. - Weight management and regular exercise should be initiated or continued if appropriate.  [Electronically signed] 11/15/2018 11:08 AM  Baird Lyons MD, ABSM Diplomate, American Board of Sleep Medicine   NPI: [id number]                          [place], [place] [facility]  ELECTRONICALLY SIGNED ON:  11/15/2018, 11:04 AM [place] PH: [phone]   FX: [phone] [place]

## 2018-11-17 MED FILL — ?TADALAFIL (PAH) 20 MG TABS: 20 | 30 days supply | Qty: 10 | Fill #0

## 2018-11-18 ENCOUNTER — Encounter: Payer: Self-pay | Admitting: Vascular Surgery

## 2018-11-18 ENCOUNTER — Other Ambulatory Visit: Payer: Self-pay

## 2018-11-18 ENCOUNTER — Ambulatory Visit (INDEPENDENT_AMBULATORY_CARE_PROVIDER_SITE_OTHER): Payer: No Typology Code available for payment source | Admitting: Vascular Surgery

## 2018-11-18 VITALS — BP 108/71 | HR 68 | Temp 98.0°F | Resp 20 | Ht 70.0 in | Wt 245.0 lb

## 2018-11-18 DIAGNOSIS — I83893 Varicose veins of bilateral lower extremities with other complications: Secondary | ICD-10-CM

## 2018-11-18 NOTE — Progress Notes (Signed)
[facility] [place]  Patient name: Levi Alvarez MRN: [id number] DOB: 08/03/1971 Sex: male  REASON FOR CONSULT: Valuation of lower extremity discomfort and swelling  HPI: Levi Alvarez is a 48 y.o. male, who is here today for evaluation of lower extremity discomfort and swelling.  He has progressively increased swelling over both lower extremities.  He also has having changes of hemosiderin deposits on his ankles bilaterally.  He has no known history of DVT.  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  Also reports arthritis in his left knee.  He does not have any claudication type symptoms and has had no history of tissue loss.  No history of cardiac disease  Past Medical History:  Diagnosis Date  . Hepatitis C antibody positive in blood   . High blood pressure     Family History  Problem Relation Age of Onset  . Colitis Mother   . Arthritis Mother   . Cancer Father        lymphoma?  . Cancer Paternal Grandfather        bone  . Heart disease Neg Hx   . Stroke Neg Hx   . Diabetes Neg Hx     SOCIAL HISTORY: Social History   Socioeconomic History  . Marital status: Divorced    Spouse name: Not on file  . Number of children: Not on file  . Years of education: Not on file  . Highest education level: Not on file  Occupational History  . Not on file  Social Needs  . Financial resource strain: Not on file  . Food insecurity:    Worry: Not on file    Inability: Not on file  . Transportation needs:    Medical: Not on file    Non-medical: Not on file  Tobacco Use  . Smoking status: Current Every Day Smoker    Packs/day: 1.00    Types: Cigarettes  . Smokeless tobacco: Former Systems developer    Types: Chew  Substance and Sexual Activity  . Alcohol use: Yes    Alcohol/week: 18.0 standard drinks    Types: 18 Cans of beer per week    Frequency: Never    Comment: daily  . Drug use: No  . Sexual activity: Yes    Partners: Female  Lifestyle  . Physical activity:    Days per week: Not on file    Minutes per session: Not on file  . Stress: Not on file  Relationships  . Social connections:    Talks on phone: Not on file    Gets together: Not on file    Attends religious service: Not on file    Active member of club or organization: Not on file    Attends meetings of clubs or organizations: Not on file    Relationship status: Not on file  . Intimate partner violence:    Fear of current or ex partner: Not on file    Emotionally abused: Not on file    Physically abused: Not on file    Forced sexual activity: Not on file  Other Topics Concern  . Not on file  Social History Narrative  . Not on file    Allergies  Allergen Reactions  . Naproxen Hives and Swelling    Current Outpatient Medications  Medication Sig Dispense Refill  . albuterol (PROVENTIL) (2.5 MG/3ML) 0.083% nebulizer solution Take 3  mLs (2.5 mg total) by nebulization every 6 (six) hours as needed for wheezing or shortness of breath. 75 mL 12  . BREO ELLIPTA 200-25 MCG/INH AEPB INHALE 1 PUFF DAILY INTO THE LUNGS. 60 each 2  . diazepam (VALIUM) 5 MG tablet Take as directed prior to procedure. 3 tablet 0  . fluticasone furoate-vilanterol (BREO ELLIPTA) 200-25 MCG/INH AEPB Inhale 1 puff into the lungs daily. 1 each 6  . lidocaine (LIDODERM) 5 % Place 1 patch onto the skin daily. Remove & Discard patch within 12 hours or as directed by MD 30 patch 1  . Multiple Vitamin (MULTI-VITAMINS) TABS Take 1 tablet by mouth 1 day or 1 dose.    . spironolactone (ALDACTONE) 50 MG tablet Take 1 tablet (50 mg total) by mouth daily. 30 tablet 2  . tadalafil, PAH, (ADCIRCA) 20 MG tablet TAKE 1/2-1 TABLET BY MOUTH EVERY OTHER DAY AS NEEDED FOR ERECTILE DYSFUNCTION. 10 tablet 1  . VENTOLIN HFA 108 (90 Base) MCG/ACT inhaler INHALE 1 PUFF EVERY 6 (SIX) HOURS AS NEEDED INTO THE LUNGS FOR WHEEZING  OR SHORTNESS OF BREATH. 18 g 1  . carvedilol (COREG) 3.125 MG tablet Take 1 tablet (3.125 mg total) by mouth 2 (two) times daily. 60 tablet 1   Current Facility-Administered Medications  Medication Dose Route Frequency Provider Last Rate Last Dose  . 0.9 %  sodium chloride infusion  500 mL Intravenous Continuous Armbruster, Carlota Raspberry, MD        REVIEW OF SYSTEMS:  [X]  denotes positive finding, [ ]  denotes negative finding Cardiac  Comments:  Chest pain or chest pressure:    Shortness of breath upon exertion: x   Short of breath when lying flat: x   Irregular heart rhythm:        Vascular    Pain in calf, thigh, or hip brought on by ambulation:    Pain in feet at night that wakes you up from your sleep:     Blood clot in your veins:    Leg swelling:  x       Pulmonary    Oxygen at home:    Productive cough:     Wheezing:  x       Neurologic    Sudden weakness in arms or legs:     Sudden numbness in arms or legs:     Sudden onset of difficulty speaking or slurred speech:    Temporary loss of vision in one eye:     Problems with dizziness:         Gastrointestinal    Blood in stool:     Vomited blood:         Genitourinary    Burning when urinating:     Blood in urine:        Psychiatric    Major depression:         Hematologic    Bleeding problems:    Problems with blood clotting too easily:        Skin    Rashes or ulcers:        Constitutional    Fever or chills:      PHYSICAL EXAM: Vitals:   11/18/18 1420  BP: 108/71  Pulse: 68  Resp: 20  Temp: 98 F (36.7 C)  SpO2: 96%  Weight: 245 lb (111.1 kg)  Height: 5\' 10"  (1.778 m)    GENERAL: The patient is a well-nourished male, in no acute distress. The vital signs are documented above.  CARDIOVASCULAR: 2+ radial pulses bilaterally.  He has 2+ dorsalis pedis pulse on the right foot.  On the left he does have palpable pulses but has extremely marked swelling making pulse evaluation difficult. PULMONARY:  There is good air exchange  ABDOMEN: Soft and non-tender  MUSCULOSKELETAL: There are no major deformities or cyanosis. NEUROLOGIC: No focal weakness or paresthesias are detected. SKIN: There are no ulcers or rashes noted.  No open ulcer but circumferential hemosiderin deposit both lower extremities left greater than right PSYCHIATRIC: The patient has a normal affect.  DATA:  Hand-held Doppler reveals normal triphasic waveforms in both feet  SonoSite ultrasound reveals dilatation of his saphenous vein bilaterally and does appear to have reflux on color flow Doppler  MEDICAL ISSUES: He had undergone a screening ABIs at an outlying facility showing normal ankle arm index.  I reassured him that he does not have any evidence of arterial insufficiency.  He does have venous congestion.  He has not worn compression garments.  We have instructed him today on the use of graduated compression garments 20 to 30 mmHg.  I do feel that he would be a potential candidate for ablation of his saphenous vein.  He will be seen back in our office in 3 months with either Dr.  Scot Dock or fields with bilateral lower extremity reflux studies to determine if ablation would be appropriate.   Rosetta Posner, MD FACS [facility] Tel [phone] Pager [phone]

## 2018-11-24 ENCOUNTER — Inpatient Hospital Stay (HOSPITAL_COMMUNITY)
Admission: EM | Admit: 2018-11-24 | Discharge: 2018-11-28 | DRG: 432 | Disposition: A | Discharge status: Home / Self Care | Payer: Self-pay | Attending: Family Medicine | Admitting: Family Medicine

## 2018-11-24 DIAGNOSIS — B182 Chronic viral hepatitis C: Secondary | ICD-10-CM | POA: Diagnosis present

## 2018-11-24 DIAGNOSIS — M2550 Pain in unspecified joint: Secondary | ICD-10-CM | POA: Diagnosis present

## 2018-11-24 DIAGNOSIS — K920 Hematemesis: Secondary | ICD-10-CM

## 2018-11-24 DIAGNOSIS — I8511 Secondary esophageal varices with bleeding: Secondary | ICD-10-CM | POA: Diagnosis present

## 2018-11-24 DIAGNOSIS — K7030 Alcoholic cirrhosis of liver without ascites: Principal | ICD-10-CM | POA: Diagnosis present

## 2018-11-24 DIAGNOSIS — Z7951 Long term (current) use of inhaled steroids: Secondary | ICD-10-CM

## 2018-11-24 DIAGNOSIS — E785 Hyperlipidemia, unspecified: Secondary | ICD-10-CM | POA: Diagnosis present

## 2018-11-24 DIAGNOSIS — Z888 Allergy status to other drugs, medicaments and biological substances status: Secondary | ICD-10-CM

## 2018-11-24 DIAGNOSIS — D62 Acute posthemorrhagic anemia: Secondary | ICD-10-CM | POA: Diagnosis present

## 2018-11-24 DIAGNOSIS — F1010 Alcohol abuse, uncomplicated: Secondary | ICD-10-CM | POA: Diagnosis present

## 2018-11-24 DIAGNOSIS — M549 Dorsalgia, unspecified: Secondary | ICD-10-CM | POA: Diagnosis present

## 2018-11-24 DIAGNOSIS — G8929 Other chronic pain: Secondary | ICD-10-CM | POA: Diagnosis present

## 2018-11-24 DIAGNOSIS — J449 Chronic obstructive pulmonary disease, unspecified: Secondary | ICD-10-CM | POA: Diagnosis present

## 2018-11-24 DIAGNOSIS — F17210 Nicotine dependence, cigarettes, uncomplicated: Secondary | ICD-10-CM | POA: Diagnosis present

## 2018-11-24 DIAGNOSIS — D696 Thrombocytopenia, unspecified: Secondary | ICD-10-CM | POA: Diagnosis present

## 2018-11-24 DIAGNOSIS — F1020 Alcohol dependence, uncomplicated: Secondary | ICD-10-CM | POA: Diagnosis present

## 2018-11-24 DIAGNOSIS — I10 Essential (primary) hypertension: Secondary | ICD-10-CM | POA: Diagnosis present

## 2018-11-24 DIAGNOSIS — M25569 Pain in unspecified knee: Secondary | ICD-10-CM | POA: Diagnosis present

## 2018-11-24 DIAGNOSIS — Z79899 Other long term (current) drug therapy: Secondary | ICD-10-CM

## 2018-11-24 DIAGNOSIS — Z6834 Body mass index (BMI) 34.0-34.9, adult: Secondary | ICD-10-CM

## 2018-11-24 DIAGNOSIS — Z808 Family history of malignant neoplasm of other organs or systems: Secondary | ICD-10-CM

## 2018-11-24 DIAGNOSIS — G4733 Obstructive sleep apnea (adult) (pediatric): Secondary | ICD-10-CM | POA: Diagnosis present

## 2018-11-24 DIAGNOSIS — K7010 Alcoholic hepatitis without ascites: Secondary | ICD-10-CM | POA: Diagnosis present

## 2018-11-24 DIAGNOSIS — K922 Gastrointestinal hemorrhage, unspecified: Secondary | ICD-10-CM

## 2018-11-24 DIAGNOSIS — K7460 Unspecified cirrhosis of liver: Secondary | ICD-10-CM | POA: Diagnosis present

## 2018-11-24 LAB — COMPREHENSIVE METABOLIC PANEL
ALK PHOS: 69 U/L (ref 38–126)
ALT: 39 U/L (ref 0–44)
AST: 109 U/L — ABNORMAL HIGH (ref 15–41)
Albumin: 3 g/dL — ABNORMAL LOW (ref 3.5–5.0)
Anion gap: 10 (ref 5–15)
BUN: 20 mg/dL (ref 6–20)
CO2: 24 mmol/L (ref 22–32)
Calcium: 8.4 mg/dL — ABNORMAL LOW (ref 8.9–10.3)
Chloride: 107 mmol/L (ref 98–111)
Creatinine, Ser: 0.68 mg/dL (ref 0.61–1.24)
GFR calc Af Amer: >60 mL/min (ref >60)
GFR calc non Af Amer: >60 mL/min (ref >60)
GLUCOSE: 151 mg/dL — AB (ref 70–99)
Potassium: 5.1 mmol/L (ref 3.5–5.1)
Sodium: 141 mmol/L (ref 135–145)
Total Bilirubin: 1.2 mg/dL (ref 0.3–1.2)
Total Protein: 7.1 g/dL (ref 6.5–8.1)

## 2018-11-24 LAB — CBC WITH DIFFERENTIAL/PLATELET
Abs Immature Granulocytes: 0.07 10*3/uL (ref 0.00–0.07)
Basophils Absolute: 0.1 10*3/uL (ref 0.0–0.1)
Basophils Relative: 1 %
EOS ABS: 0 10*3/uL (ref 0.0–0.5)
Eosinophils Relative: 0 %
HCT: 23.5 % — ABNORMAL LOW (ref 39.0–52.0)
Hemoglobin: 6.8 g/dL — CL (ref 13.0–17.0)
Immature Granulocytes: 1 %
LYMPHS ABS: 1.6 10*3/uL (ref 0.7–4.0)
Lymphocytes Relative: 16 %
MCH: 23.9 pg — ABNORMAL LOW (ref 26.0–34.0)
MCHC: 28.9 g/dL — ABNORMAL LOW (ref 30.0–36.0)
MCV: 82.7 fL (ref 80.0–100.0)
MONOS PCT: 13 %
Monocytes Absolute: 1.2 10*3/uL — ABNORMAL HIGH (ref 0.1–1.0)
Neutro Abs: 6.9 10*3/uL (ref 1.7–7.7)
Neutrophils Relative %: 69 %
Platelets: 134 10*3/uL — ABNORMAL LOW (ref 150–400)
RBC: 2.84 MIL/uL — ABNORMAL LOW (ref 4.22–5.81)
RDW: 21.6 % — ABNORMAL HIGH (ref 11.5–15.5)
WBC: 10 10*3/uL (ref 4.0–10.5)
nRBC: 0 % (ref 0.0–0.2)

## 2018-11-24 LAB — PREPARE RBC (CROSSMATCH)

## 2018-11-24 LAB — PROTIME-INR
INR: 1.32
Prothrombin Time: 16.3 seconds — ABNORMAL HIGH (ref 11.4–15.2)

## 2018-11-24 LAB — ABO/RH: ABO/RH(D): A POS

## 2018-11-24 LAB — LIPASE, BLOOD: Lipase: 83 U/L — ABNORMAL HIGH (ref 11–51)

## 2018-11-24 MED ORDER — FOLIC ACID 1 MG PO TABS
1.0000 mg | ORAL_TABLET | Freq: Every day | ORAL | Status: DC
  Administered 2018-11-26 – 2018-11-28 (×3): 1 mg via ORAL
  Filled 2018-11-24 (×3): qty 1

## 2018-11-24 MED ORDER — LORAZEPAM 2 MG/ML IJ SOLN
1.0000 mg | Freq: Four times a day (QID) | INTRAMUSCULAR | Status: AC | PRN
  Administered 2018-11-25 (×2): 1 mg via INTRAVENOUS
  Filled 2018-11-24 (×2): qty 1

## 2018-11-24 MED ORDER — ALBUTEROL SULFATE (2.5 MG/3ML) 0.083% IN NEBU: 2.5000 mg | INHALATION_SOLUTION | RESPIRATORY_TRACT | Status: DC | PRN

## 2018-11-24 MED ORDER — ONDANSETRON HCL 4 MG/2ML IJ SOLN
4.0000 mg | Freq: Once | INTRAMUSCULAR | Status: AC
  Administered 2018-11-24: 4 mg via INTRAVENOUS
  Filled 2018-11-24: qty 2

## 2018-11-24 MED ORDER — LORAZEPAM 2 MG/ML IJ SOLN
0.0000 mg | Freq: Two times a day (BID) | INTRAMUSCULAR | Status: DC
  Administered 2018-11-26: 1 mg via INTRAVENOUS
  Filled 2018-11-24: qty 1

## 2018-11-24 MED ORDER — LORAZEPAM 1 MG PO TABS
1.0000 mg | ORAL_TABLET | Freq: Four times a day (QID) | ORAL | Status: AC | PRN
  Administered 2018-11-27 (×2): 1 mg via ORAL
  Filled 2018-11-24 (×2): qty 1

## 2018-11-24 MED ORDER — SODIUM CHLORIDE 0.9 % IV SOLN
10.0000 mL/h | Freq: Once | INTRAVENOUS | Status: AC
  Administered 2018-11-25: 14:00:00 via INTRAVENOUS

## 2018-11-24 MED ORDER — ONDANSETRON HCL 4 MG PO TABS: 4.0000 mg | ORAL_TABLET | Freq: Four times a day (QID) | ORAL | Status: DC | PRN

## 2018-11-24 MED ORDER — SODIUM CHLORIDE 0.9 % IV SOLN
50.0000 ug/h | INTRAVENOUS | Status: DC
  Administered 2018-11-24 – 2018-11-28 (×7): 50 ug/h via INTRAVENOUS
  Filled 2018-11-24 (×14): qty 1

## 2018-11-24 MED ORDER — SODIUM CHLORIDE 0.9 % IV SOLN
8.0000 mg/h | INTRAVENOUS | Status: DC
  Administered 2018-11-24 – 2018-11-25 (×2): 8 mg/h via INTRAVENOUS
  Filled 2018-11-24 (×5): qty 80

## 2018-11-24 MED ORDER — PANTOPRAZOLE SODIUM 40 MG IV SOLR
40.0000 mg | Freq: Once | INTRAVENOUS | Status: AC
  Administered 2018-11-24: 40 mg via INTRAVENOUS
  Filled 2018-11-24: qty 40

## 2018-11-24 MED ORDER — OCTREOTIDE LOAD VIA INFUSION
50.0000 ug | Freq: Once | INTRAVENOUS | Status: AC
  Administered 2018-11-24: 50 ug via INTRAVENOUS
  Filled 2018-11-24: qty 25

## 2018-11-24 MED ORDER — LORAZEPAM 2 MG/ML IJ SOLN
0.0000 mg | Freq: Four times a day (QID) | INTRAMUSCULAR | Status: AC
  Administered 2018-11-24: 2 mg via INTRAVENOUS
  Administered 2018-11-25: 1 mg via INTRAVENOUS
  Administered 2018-11-25: 2 mg via INTRAVENOUS
  Filled 2018-11-24 (×3): qty 1

## 2018-11-24 MED ORDER — VITAMIN B-1 100 MG PO TABS
100.0000 mg | ORAL_TABLET | Freq: Every day | ORAL | Status: DC
  Administered 2018-11-26 – 2018-11-28 (×3): 100 mg via ORAL
  Filled 2018-11-24 (×3): qty 1

## 2018-11-24 MED ORDER — SODIUM CHLORIDE 0.9 % IV BOLUS
1000.0000 mL | Freq: Once | INTRAVENOUS | Status: AC
  Administered 2018-11-24: 1000 mL via INTRAVENOUS

## 2018-11-24 MED ORDER — ACETAMINOPHEN 325 MG PO TABS: 650.0000 mg | ORAL_TABLET | Freq: Four times a day (QID) | ORAL | Status: DC | PRN

## 2018-11-24 MED ORDER — ACETAMINOPHEN 650 MG RE SUPP: 650.0000 mg | Freq: Four times a day (QID) | RECTAL | Status: DC | PRN

## 2018-11-24 MED ORDER — SODIUM CHLORIDE 0.9 % IV SOLN
INTRAVENOUS | Status: DC
  Administered 2018-11-24 – 2018-11-27 (×5): via INTRAVENOUS

## 2018-11-24 MED ORDER — ADULT MULTIVITAMIN W/MINERALS CH
1.0000 | ORAL_TABLET | Freq: Every day | ORAL | Status: DC
  Administered 2018-11-26 – 2018-11-28 (×3): 1 via ORAL
  Filled 2018-11-24 (×3): qty 1

## 2018-11-24 MED ORDER — PANTOPRAZOLE SODIUM 40 MG IV SOLR: 40.0000 mg | Freq: Two times a day (BID) | INTRAVENOUS | Status: DC

## 2018-11-24 MED ORDER — ONDANSETRON HCL 4 MG/2ML IJ SOLN
4.0000 mg | Freq: Four times a day (QID) | INTRAMUSCULAR | Status: DC | PRN
  Administered 2018-11-25: 4 mg via INTRAVENOUS
  Filled 2018-11-24: qty 2

## 2018-11-24 MED ORDER — FLUTICASONE FUROATE-VILANTEROL 200-25 MCG/INH IN AEPB
1.0000 | INHALATION_SPRAY | Freq: Every day | RESPIRATORY_TRACT | Status: DC
  Administered 2018-11-26 – 2018-11-28 (×2): 1 via RESPIRATORY_TRACT
  Filled 2018-11-24 (×2): qty 28

## 2018-11-24 MED ORDER — THIAMINE HCL 100 MG/ML IJ SOLN
100.0000 mg | Freq: Every day | INTRAMUSCULAR | Status: DC
  Administered 2018-11-25: 200 mg via INTRAVENOUS
  Administered 2018-11-25: 100 mg via INTRAVENOUS
  Filled 2018-11-24 (×2): qty 2

## 2018-11-24 NOTE — H&P (View-Only) (Signed)
Consultation  Referring Provider:  Dr. Stark Jock     Primary Care Physician:  Gildardo Pounds, NP Primary Gastroenterologist: Dr. Havery Moros         Reason for Consultation:  Hematemesis            HPI:   Levi Alvarez is a 48 y.o. male with a past medical history as listed below including hep C and cirrhosis as well as chronic alcoholism, who presented to the ER today with a complaint of hematemesis.    Today, patient explains he began feeling dizzy and clammy last night, they turned on the air and he was able to go back to sleep.  He woke up around 11/12 this morning/earlyafternoon and was extremely dizzy and clammy and became immediately nauseated and vomited "a lot of bright red blood" on the way to the bathroom.  He had a little more at home and his last episode was about a half of an emesis bag full about an hour ago in th ER.  Does admit that he had been having some abdominal pain off and on over the past month or so.      Also describes seeing some black tarry stools off and on over the past 1 to 2 months.  Does not recall the last time.  Does continue to drink at least 18 beers a day.  Also admits to using BC powders at least 1/day over the past 6 to 8 months or for some generalized body aches.    Social history positive for trying to obtain disability.  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    Denies fever, chills, weight loss, anorexia, heartburn or reflux.  ED course: Hemoglobin 6.8 (14.4-year ago), white count normal, BUN normal, AST elevated at 109 (158-year ago)  GI history:  11/21/2017 office visit any [place]: New diagnosis of cirrhosis, history of 18 beers per day for the past several years, had stopped drinking alcohol the past 6 weeks at that time 01/06/2018 EGD Dr. Havery Moros: Normal esophagus, no varices, 2 cm hiatal hernia, gastritis, normal duodenal bulb and second portion of  duodenum ; pathology with reactive gastropathy 06/02/2018 right upper quadrant ultrasound with mild changes of early cirrhosis within the liver without evidence of focal mass, no other abnormality  Past Medical History:  Diagnosis Date  . Hepatitis C antibody positive in blood   . High blood pressure     Past Surgical History:  Procedure Laterality Date  . arm surgery    . KNEE SURGERY      Family History  Problem Relation Age of Onset  . Colitis Mother   . Arthritis Mother   . Cancer Father        lymphoma?  . Cancer Paternal Grandfather        bone  . Heart disease Neg Hx   . Stroke Neg Hx   . Diabetes Neg Hx     Social History   Tobacco Use  . Smoking status: Current Every Day Smoker    Packs/day: 1.00    Types: Cigarettes  . Smokeless tobacco: Former Systems developer    Types: Chew  Substance Use Topics  . Alcohol use: Yes    Alcohol/week: 18.0 standard drinks    Types: 18 Cans of beer per week    Frequency: Never    Comment: daily  . Drug use: No  Prior to Admission medications   Medication Sig Start Date End Date Taking? Authorizing Provider  albuterol (PROVENTIL) (2.5 MG/3ML) 0.083% nebulizer solution Take 3 mLs (2.5 mg total) by nebulization every 6 (six) hours as needed for wheezing or shortness of breath. 07/22/18   Gildardo Pounds, NP  BREO ELLIPTA 200-25 MCG/INH AEPB INHALE 1 PUFF DAILY INTO THE LUNGS. 09/11/18   Gildardo Pounds, NP  carvedilol (COREG) 3.125 MG tablet Take 1 tablet (3.125 mg total) by mouth 2 (two) times daily. 09/11/18 10/11/18  Gildardo Pounds, NP  diazepam (VALIUM) 5 MG tablet Take as directed prior to procedure. 11/03/18   Marybelle Killings, MD  fluticasone furoate-vilanterol (BREO ELLIPTA) 200-25 MCG/INH AEPB Inhale 1 puff into the lungs daily. 11/01/17   Gildardo Pounds, NP  lidocaine (LIDODERM) 5 % Place 1 patch onto the skin daily. Remove & Discard patch within 12 hours or as directed by MD 08/05/18   Gildardo Pounds, NP  Multiple Vitamin  (MULTI-VITAMINS) TABS Take 1 tablet by mouth 1 day or 1 dose. 04/04/15   [provider]  spironolactone (ALDACTONE) 50 MG tablet Take 1 tablet (50 mg total) by mouth daily. 10/17/18   Gildardo Pounds, NP  tadalafil, PAH, (ADCIRCA) 20 MG tablet TAKE 1/2-1 TABLET BY MOUTH EVERY OTHER DAY AS NEEDED FOR ERECTILE DYSFUNCTION. 11/03/18   Gildardo Pounds, NP  VENTOLIN HFA 108 (90 Base) MCG/ACT inhaler INHALE 1 PUFF EVERY 6 (SIX) HOURS AS NEEDED INTO THE LUNGS FOR WHEEZING OR SHORTNESS OF BREATH. 06/19/18   Gildardo Pounds, NP    Current Facility-Administered Medications  Medication Dose Route Frequency Provider Last Rate Last Dose  . 0.9 %  sodium chloride infusion  500 mL Intravenous Continuous Armbruster, Carlota Raspberry, MD      . 0.9 %  sodium chloride infusion  10 mL/hr Intravenous Once Veryl Speak, MD       Current Outpatient Medications  Medication Sig Dispense Refill  . albuterol (PROVENTIL) (2.5 MG/3ML) 0.083% nebulizer solution Take 3 mLs (2.5 mg total) by nebulization every 6 (six) hours as needed for wheezing or shortness of breath. 75 mL 12  . BREO ELLIPTA 200-25 MCG/INH AEPB INHALE 1 PUFF DAILY INTO THE LUNGS. 60 each 2  . carvedilol (COREG) 3.125 MG tablet Take 1 tablet (3.125 mg total) by mouth 2 (two) times daily. 60 tablet 1  . diazepam (VALIUM) 5 MG tablet Take as directed prior to procedure. 3 tablet 0  . fluticasone furoate-vilanterol (BREO ELLIPTA) 200-25 MCG/INH AEPB Inhale 1 puff into the lungs daily. 1 each 6  . lidocaine (LIDODERM) 5 % Place 1 patch onto the skin daily. Remove & Discard patch within 12 hours or as directed by MD 30 patch 1  . Multiple Vitamin (MULTI-VITAMINS) TABS Take 1 tablet by mouth 1 day or 1 dose.    . spironolactone (ALDACTONE) 50 MG tablet Take 1 tablet (50 mg total) by mouth daily. 30 tablet 2  . tadalafil, PAH, (ADCIRCA) 20 MG tablet TAKE 1/2-1 TABLET BY MOUTH EVERY OTHER DAY AS NEEDED FOR ERECTILE DYSFUNCTION. 10 tablet 1  . VENTOLIN HFA 108  (90 Base) MCG/ACT inhaler INHALE 1 PUFF EVERY 6 (SIX) HOURS AS NEEDED INTO THE LUNGS FOR WHEEZING OR SHORTNESS OF BREATH. 18 g 1    Allergies as of 11/24/2018 - Review Complete 11/18/2018  Allergen Reaction Noted  . Naproxen Hives and Swelling 03/03/2015     Review of Systems:    Constitutional: No weight loss,  fever or chills Skin: No rash Cardiovascular: No chest pain Respiratory: No SOB  Gastrointestinal: See HPI and otherwise negative Genitourinary: No dysuria  Neurological: +dizziness Musculoskeletal: +for chronic joint and muscle pain Hematologic: No bruising Psychiatric: No history of depression or anxiety    Physical Exam:  Vital signs in last 24 hours: Temp:  [98.7 F (37.1 C)] 98.7 F (37.1 C) (01/13 1515) Pulse Rate:  [93-97] 96 (01/13 1600) Resp:  [16] 16 (01/13 1600) BP: (121-133)/(82-86) 133/85 (01/13 1600) SpO2:  [96 %-99 %] 97 % (01/13 1600)   General:   Pleasant Caucasian male appears to be in NAD, Well developed, Well nourished, alert and cooperative Head:  Normocephalic and atraumatic. Eyes:   PEERL, EOMI. No icterus. Conjunctiva pink. Ears:  Normal auditory acuity. Neck:  Supple Throat: Oral cavity and pharynx without inflammation, swelling or lesion.  Lungs: Respirations even and unlabored. Lungs clear to auscultation bilaterally.   No wheezes, crackles, or rhonchi.  Heart: Normal S1, S2. No MRG. Regular rate and rhythm. No peripheral edema, cyanosis or pallor.  Abdomen:  Soft, nondistended, mild epigastric ttp, No rebound or guarding. Normal bowel sounds. No appreciable masses or hepatomegaly. Rectal:  Not performed.  Msk:  Symmetrical without gross deformities. Peripheral pulses intact.  Extremities:  Without edema, no deformity or joint abnormality. Neurologic:  Alert and  oriented x4;  grossly normal neurologically.  Skin:   Dry and intact without significant lesions or rashes.+tattoos over surface of body Psychiatric: Demonstrates good judgement  and reason without abnormal affect or behaviors.   LAB RESULTS: Recent Labs    11/24/18 1519  WBC 10.0  HGB 6.8*  HCT 23.5*  PLT 134*   BMET Recent Labs    11/24/18 1519  NA 141  K 5.1  CL 107  CO2 24  GLUCOSE 151*  BUN 20  CREATININE 0.68  CALCIUM 8.4*   LFT Recent Labs    11/24/18 1519  PROT 7.1  ALBUMIN 3.0*  AST 109*  ALT 39  ALKPHOS 69  BILITOT 1.2   PT/INR Recent Labs    11/24/18 1519  LABPROT 16.3*  INR 1.32    Impression / Plan:   Impression: 1.  Hematemesis: Started this morning x3 episodes, bright red blood, history of BC powder use and melena for the preceding months; consider ulcer versus varices versus other 2.  Cirrhosis: Diagnosed in January of this year, EGD in February with no varices, likely related to history of hep C and alcoholism, admits to 18 beers per day 3.  Melena 4.  Alcoholism: We will need CIWA protocol-per hospitalist  Plan: 1.  Start IV Pantoprazole infusion 2.  Start Octreotide 3.  Continue to monitor hemoglobin with transfusion as needed, agree with 2 units now 4.  We will plan for EGD tomorrow as long as patient is stable and hemoglobin greater than 7.  Did discuss risks, benefits, limitations and alternatives and the patient agrees to proceed.  This is scheduled with Dr. Ardis Hughs. 5. Discussed abstaining from alcohol and NSAIDs going forward 6. Will need CIWA protocol per hospitalist 7.  Please await any further recommendations from Dr. Ardis Hughs.  Thank you for your kind consultation, we will continue to follow.  Lavone Nian [facility]  11/24/2018, 4:36 PM  ________________________________________________________________________  Velora Heckler GI MD note:  I personally examined the patient, reviewed the data and agree with the assessment and plan described above. Overt UGI bleeding.  HD stable currently.  Possibly he's developed varices since EGD last year (still heavy  etoh abuser), possibly peptic ulcer (takes NSAIDs daily),  possibly other cause. He needs IV PPI drip, IV octreotide, at least 2 unit blood transfusion. Planning on EGD tomorrow.   Owens Loffler, MD [facility] Gastroenterology Pager [phone]

## 2018-11-24 NOTE — ED Provider Notes (Signed)
[facility] DEPT Provider Note   CSN: [id number] Arrival date & time: 11/24/18  1455     History   Chief Complaint Chief Complaint  Patient presents with  . Hematemesis    HPI Levi Alvarez is a 48 y.o. male.  Patient is a 48 year old male with past medical history of chronic alcoholism, hepatitis C, and cirrhosis.  He began feeling dizzy yesterday evening, then nauseated today.  He went into the bathroom and had multiple episodes of bloody emesis.  He denies any abdominal pain, fevers, or chills.  He does report his stool is been somewhat dark.  According to the wife, he had an endoscopy in February of last year with Dr. Havery Moros from [facility] showing gastritis, but no varices.  The history is provided by the patient.  Emesis  Severity:  Moderate Duration:  2 hours Timing:  Constant Quality:  Bright red blood Progression:  Worsening Chronicity:  New Relieved by:  Nothing Worsened by:  Nothing Ineffective treatments:  None tried Associated symptoms: no abdominal pain, no diarrhea and no fever   Risk factors: alcohol use     Past Medical History:  Diagnosis Date  . Hepatitis C antibody positive in blood   . High blood pressure     Patient Active Problem List   Diagnosis Date Noted  . Chronic hepatitis C without hepatic coma ([place]) 10/08/2017  . Cirrhosis ([place]) 10/08/2017  . Chronic pain of left knee 07/23/2017  . Essential hypertension 07/12/2017  . OSA (obstructive sleep apnea) 07/12/2017  . Class 2 obesity with serious comorbidity and body mass index (BMI) of 35.0 to 35.9 in adult 07/12/2017  . Elevated liver enzymes 07/12/2017  . Other chronic pain 07/12/2017  . Smoker 07/12/2017  . Hyperlipidemia 07/12/2017  . Polyarthralgia 07/12/2017  . Snoring 06/05/2017    Past Surgical History:  Procedure Laterality Date  . arm surgery    . KNEE SURGERY          Home Medications    Prior to Admission medications     Medication Sig Start Date End Date Taking? Authorizing Provider  albuterol (PROVENTIL) (2.5 MG/3ML) 0.083% nebulizer solution Take 3 mLs (2.5 mg total) by nebulization every 6 (six) hours as needed for wheezing or shortness of breath. 07/22/18   Gildardo Pounds, NP  BREO ELLIPTA 200-25 MCG/INH AEPB INHALE 1 PUFF DAILY INTO THE LUNGS. 09/11/18   Gildardo Pounds, NP  carvedilol (COREG) 3.125 MG tablet Take 1 tablet (3.125 mg total) by mouth 2 (two) times daily. 09/11/18 10/11/18  Gildardo Pounds, NP  diazepam (VALIUM) 5 MG tablet Take as directed prior to procedure. 11/03/18   Marybelle Killings, MD  fluticasone furoate-vilanterol (BREO ELLIPTA) 200-25 MCG/INH AEPB Inhale 1 puff into the lungs daily. 11/01/17   Gildardo Pounds, NP  lidocaine (LIDODERM) 5 % Place 1 patch onto the skin daily. Remove & Discard patch within 12 hours or as directed by MD 08/05/18   Gildardo Pounds, NP  Multiple Vitamin (MULTI-VITAMINS) TABS Take 1 tablet by mouth 1 day or 1 dose. 04/04/15   [provider]  spironolactone (ALDACTONE) 50 MG tablet Take 1 tablet (50 mg total) by mouth daily. 10/17/18   Gildardo Pounds, NP  tadalafil, PAH, (ADCIRCA) 20 MG tablet TAKE 1/2-1 TABLET BY MOUTH EVERY OTHER DAY AS NEEDED FOR ERECTILE DYSFUNCTION. 11/03/18   Gildardo Pounds, NP  VENTOLIN HFA 108 (90 Base) MCG/ACT inhaler INHALE 1 PUFF EVERY 6 (SIX)  HOURS AS NEEDED INTO THE LUNGS FOR WHEEZING OR SHORTNESS OF BREATH. 06/19/18   Gildardo Pounds, NP    Family History Family History  Problem Relation Age of Onset  . Colitis Mother   . Arthritis Mother   . Cancer Father        lymphoma?  . Cancer Paternal Grandfather        bone  . Heart disease Neg Hx   . Stroke Neg Hx   . Diabetes Neg Hx     Social History Social History   Tobacco Use  . Smoking status: Current Every Day Smoker    Packs/day: 1.00    Types: Cigarettes  . Smokeless tobacco: Former Systems developer    Types: Chew  Substance Use Topics  . Alcohol use: Yes     Alcohol/week: 18.0 standard drinks    Types: 18 Cans of beer per week    Frequency: Never    Comment: daily  . Drug use: No     Allergies   Naproxen   Review of Systems Review of Systems  Constitutional: Negative for fever.  Gastrointestinal: Positive for vomiting. Negative for abdominal pain and diarrhea.  All other systems reviewed and are negative.    Physical Exam Updated Vital Signs There were no vitals taken for this visit.  Physical Exam Vitals signs and nursing note reviewed.  Constitutional:      General: He is not in acute distress.    Appearance: He is well-developed. He is not diaphoretic.     Comments: He appears somewhat pale.  HENT:     Head: Normocephalic and atraumatic.     Mouth/Throat:     Mouth: Mucous membranes are moist.  Neck:     Musculoskeletal: Normal range of motion and neck supple.  Cardiovascular:     Rate and Rhythm: Normal rate and regular rhythm.     Heart sounds: No murmur. No friction rub.  Pulmonary:     Effort: Pulmonary effort is normal. No respiratory distress.     Breath sounds: Normal breath sounds. No wheezing or rales.  Abdominal:     General: Bowel sounds are normal. There is no distension.     Palpations: Abdomen is soft.     Tenderness: There is no abdominal tenderness.  Musculoskeletal: Normal range of motion.  Skin:    General: Skin is warm and dry.  Neurological:     Mental Status: He is alert and oriented to person, place, and time.     Coordination: Coordination normal.      ED Treatments / Results  Labs (all labs ordered are listed, but only abnormal results are displayed) Labs Reviewed  COMPREHENSIVE METABOLIC PANEL  CBC WITH DIFFERENTIAL/PLATELET  LIPASE, BLOOD  PROTIME-INR  TYPE AND SCREEN    EKG None  Radiology No results found.  Procedures Procedures (including critical care time)  Medications Ordered in ED Medications  sodium chloride 0.9 % bolus 1,000 mL (has no administration in  time range)     Initial Impression / Assessment and Plan / ED Course  I have reviewed the triage vital signs and the nursing notes.  Pertinent labs & imaging results that were available during my care of the patient were reviewed by me and considered in my medical decision making (see chart for details).  Patient with history of hepatitis C, cirrhosis, and alcoholic gastritis presenting with hematemesis.  This began this morning and the patient reports significant quantities of blood.  He appears hemodynamically stable, however his  hemoglobin is 6.8.  He was given IV Protonix, Zofran, IV fluids, and will be admitted to the hospitalist service under the care of Dr. [place] Pink.  I have also consulted GI who will assess the patient.  He has been written for 2 units of packed red cells.  CRITICAL CARE Performed by: Veryl Speak Total critical care time: 45 minutes Critical care time was exclusive of separately billable procedures and treating other patients. Critical care was necessary to treat or prevent imminent or life-threatening deterioration. Critical care was time spent personally by me on the following activities: development of treatment plan with patient and/or surrogate as well as nursing, discussions with consultants, evaluation of patient's response to treatment, examination of patient, obtaining history from patient or surrogate, ordering and performing treatments and interventions, ordering and review of laboratory studies, ordering and review of radiographic studies, pulse oximetry and re-evaluation of patient's condition.   Final Clinical Impressions(s) / ED Diagnoses   Final diagnoses:  None    ED Discharge Orders    None       Veryl Speak, MD 11/24/18 1721

## 2018-11-24 NOTE — Consult Note (Addendum)
Consultation  Referring Provider:  Dr. Stark Jock     Primary Care Physician:  Gildardo Pounds, NP Primary Gastroenterologist: Dr. Havery Moros         Reason for Consultation:  Hematemesis            HPI:   Levi Alvarez is a 48 y.o. male with a past medical history as listed below including hep C and cirrhosis as well as chronic alcoholism, who presented to the ER today with a complaint of hematemesis.    Today, patient explains he began feeling dizzy and clammy last night, they turned on the air and he was able to go back to sleep.  He woke up around 11/12 this morning/earlyafternoon and was extremely dizzy and clammy and became immediately nauseated and vomited "a lot of bright red blood" on the way to the bathroom.  He had a little more at home and his last episode was about a half of an emesis bag full about an hour ago in th ER.  Does admit that he had been having some abdominal pain off and on over the past month or so.      Also describes seeing some black tarry stools off and on over the past 1 to 2 months.  Does not recall the last time.  Does continue to drink at least 18 beers a day.  Also admits to using BC powders at least 1/day over the past 6 to 8 months or for some generalized body aches.    Social history positive for trying to obtain disability.  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    Denies fever, chills, weight loss, anorexia, heartburn or reflux.  ED course: Hemoglobin 6.8 (14.4-year ago), white count normal, BUN normal, AST elevated at 109 (158-year ago)  GI history:  11/21/2017 office visit any [facility]: New diagnosis of cirrhosis, history of 18 beers per day for the past several years, had stopped drinking alcohol the past 6 weeks at that time 01/06/2018 EGD Dr. Havery Moros: Normal esophagus, no varices, 2 cm hiatal hernia, gastritis, normal duodenal bulb and second portion of  duodenum ; pathology with reactive gastropathy 06/02/2018 right upper quadrant ultrasound with mild changes of early cirrhosis within the liver without evidence of focal mass, no other abnormality  Past Medical History:  Diagnosis Date  . Hepatitis C antibody positive in blood   . High blood pressure     Past Surgical History:  Procedure Laterality Date  . arm surgery    . KNEE SURGERY      Family History  Problem Relation Age of Onset  . Colitis Mother   . Arthritis Mother   . Cancer Father        lymphoma?  . Cancer Paternal Grandfather        bone  . Heart disease Neg Hx   . Stroke Neg Hx   . Diabetes Neg Hx     Social History   Tobacco Use  . Smoking status: Current Every Day Smoker    Packs/day: 1.00    Types: Cigarettes  . Smokeless tobacco: Former Systems developer    Types: Chew  Substance Use Topics  . Alcohol use: Yes    Alcohol/week: 18.0 standard drinks    Types: 18 Cans of beer per week    Frequency: Never    Comment: daily  . Drug use: No  Prior to Admission medications   Medication Sig Start Date End Date Taking? Authorizing Provider  albuterol (PROVENTIL) (2.5 MG/3ML) 0.083% nebulizer solution Take 3 mLs (2.5 mg total) by nebulization every 6 (six) hours as needed for wheezing or shortness of breath. 07/22/18   Gildardo Pounds, NP  BREO ELLIPTA 200-25 MCG/INH AEPB INHALE 1 PUFF DAILY INTO THE LUNGS. 09/11/18   Gildardo Pounds, NP  carvedilol (COREG) 3.125 MG tablet Take 1 tablet (3.125 mg total) by mouth 2 (two) times daily. 09/11/18 10/11/18  Gildardo Pounds, NP  diazepam (VALIUM) 5 MG tablet Take as directed prior to procedure. 11/03/18   Marybelle Killings, MD  fluticasone furoate-vilanterol (BREO ELLIPTA) 200-25 MCG/INH AEPB Inhale 1 puff into the lungs daily. 11/01/17   Gildardo Pounds, NP  lidocaine (LIDODERM) 5 % Place 1 patch onto the skin daily. Remove & Discard patch within 12 hours or as directed by MD 08/05/18   Gildardo Pounds, NP  Multiple Vitamin  (MULTI-VITAMINS) TABS Take 1 tablet by mouth 1 day or 1 dose. 04/04/15   [provider]  spironolactone (ALDACTONE) 50 MG tablet Take 1 tablet (50 mg total) by mouth daily. 10/17/18   Gildardo Pounds, NP  tadalafil, PAH, (ADCIRCA) 20 MG tablet TAKE 1/2-1 TABLET BY MOUTH EVERY OTHER DAY AS NEEDED FOR ERECTILE DYSFUNCTION. 11/03/18   Gildardo Pounds, NP  VENTOLIN HFA 108 (90 Base) MCG/ACT inhaler INHALE 1 PUFF EVERY 6 (SIX) HOURS AS NEEDED INTO THE LUNGS FOR WHEEZING OR SHORTNESS OF BREATH. 06/19/18   Gildardo Pounds, NP    Current Facility-Administered Medications  Medication Dose Route Frequency Provider Last Rate Last Dose  . 0.9 %  sodium chloride infusion  500 mL Intravenous Continuous Armbruster, Carlota Raspberry, MD      . 0.9 %  sodium chloride infusion  10 mL/hr Intravenous Once Veryl Speak, MD       Current Outpatient Medications  Medication Sig Dispense Refill  . albuterol (PROVENTIL) (2.5 MG/3ML) 0.083% nebulizer solution Take 3 mLs (2.5 mg total) by nebulization every 6 (six) hours as needed for wheezing or shortness of breath. 75 mL 12  . BREO ELLIPTA 200-25 MCG/INH AEPB INHALE 1 PUFF DAILY INTO THE LUNGS. 60 each 2  . carvedilol (COREG) 3.125 MG tablet Take 1 tablet (3.125 mg total) by mouth 2 (two) times daily. 60 tablet 1  . diazepam (VALIUM) 5 MG tablet Take as directed prior to procedure. 3 tablet 0  . fluticasone furoate-vilanterol (BREO ELLIPTA) 200-25 MCG/INH AEPB Inhale 1 puff into the lungs daily. 1 each 6  . lidocaine (LIDODERM) 5 % Place 1 patch onto the skin daily. Remove & Discard patch within 12 hours or as directed by MD 30 patch 1  . Multiple Vitamin (MULTI-VITAMINS) TABS Take 1 tablet by mouth 1 day or 1 dose.    . spironolactone (ALDACTONE) 50 MG tablet Take 1 tablet (50 mg total) by mouth daily. 30 tablet 2  . tadalafil, PAH, (ADCIRCA) 20 MG tablet TAKE 1/2-1 TABLET BY MOUTH EVERY OTHER DAY AS NEEDED FOR ERECTILE DYSFUNCTION. 10 tablet 1  . VENTOLIN HFA 108  (90 Base) MCG/ACT inhaler INHALE 1 PUFF EVERY 6 (SIX) HOURS AS NEEDED INTO THE LUNGS FOR WHEEZING OR SHORTNESS OF BREATH. 18 g 1    Allergies as of 11/24/2018 - Review Complete 11/18/2018  Allergen Reaction Noted  . Naproxen Hives and Swelling 03/03/2015     Review of Systems:    Constitutional: No weight loss,  fever or chills Skin: No rash Cardiovascular: No chest pain Respiratory: No SOB  Gastrointestinal: See HPI and otherwise negative Genitourinary: No dysuria  Neurological: +dizziness Musculoskeletal: +for chronic joint and muscle pain Hematologic: No bruising Psychiatric: No history of depression or anxiety    Physical Exam:  Vital signs in last 24 hours: Temp:  [98.7 F (37.1 C)] 98.7 F (37.1 C) (01/13 1515) Pulse Rate:  [93-97] 96 (01/13 1600) Resp:  [16] 16 (01/13 1600) BP: (121-133)/(82-86) 133/85 (01/13 1600) SpO2:  [96 %-99 %] 97 % (01/13 1600)   General:   Pleasant Caucasian male appears to be in NAD, Well developed, Well nourished, alert and cooperative Head:  Normocephalic and atraumatic. Eyes:   PEERL, EOMI. No icterus. Conjunctiva pink. Ears:  Normal auditory acuity. Neck:  Supple Throat: Oral cavity and pharynx without inflammation, swelling or lesion.  Lungs: Respirations even and unlabored. Lungs clear to auscultation bilaterally.   No wheezes, crackles, or rhonchi.  Heart: Normal S1, S2. No MRG. Regular rate and rhythm. No peripheral edema, cyanosis or pallor.  Abdomen:  Soft, nondistended, mild epigastric ttp, No rebound or guarding. Normal bowel sounds. No appreciable masses or hepatomegaly. Rectal:  Not performed.  Msk:  Symmetrical without gross deformities. Peripheral pulses intact.  Extremities:  Without edema, no deformity or joint abnormality. Neurologic:  Alert and  oriented x4;  grossly normal neurologically.  Skin:   Dry and intact without significant lesions or rashes.+tattoos over surface of body Psychiatric: Demonstrates good judgement  and reason without abnormal affect or behaviors.   LAB RESULTS: Recent Labs    11/24/18 1519  WBC 10.0  HGB 6.8*  HCT 23.5*  PLT 134*   BMET Recent Labs    11/24/18 1519  NA 141  K 5.1  CL 107  CO2 24  GLUCOSE 151*  BUN 20  CREATININE 0.68  CALCIUM 8.4*   LFT Recent Labs    11/24/18 1519  PROT 7.1  ALBUMIN 3.0*  AST 109*  ALT 39  ALKPHOS 69  BILITOT 1.2   PT/INR Recent Labs    11/24/18 1519  LABPROT 16.3*  INR 1.32    Impression / Plan:   Impression: 1.  Hematemesis: Started this morning x3 episodes, bright red blood, history of BC powder use and melena for the preceding months; consider ulcer versus varices versus other 2.  Cirrhosis: Diagnosed in January of this year, EGD in February with no varices, likely related to history of hep C and alcoholism, admits to 18 beers per day 3.  Melena 4.  Alcoholism: We will need CIWA protocol-per hospitalist  Plan: 1.  Start IV Pantoprazole infusion 2.  Start Octreotide 3.  Continue to monitor hemoglobin with transfusion as needed, agree with 2 units now 4.  We will plan for EGD tomorrow as long as patient is stable and hemoglobin greater than 7.  Did discuss risks, benefits, limitations and alternatives and the patient agrees to proceed.  This is scheduled with Dr. Ardis Hughs. 5. Discussed abstaining from alcohol and NSAIDs going forward 6. Will need CIWA protocol per hospitalist 7.  Please await any further recommendations from Dr. Ardis Hughs.  Thank you for your kind consultation, we will continue to follow.  Lavone Nian [facility]  11/24/2018, 4:36 PM  ________________________________________________________________________  Velora Heckler GI MD note:  I personally examined the patient, reviewed the data and agree with the assessment and plan described above. Overt UGI bleeding.  HD stable currently.  Possibly he's developed varices since EGD last year (still heavy  etoh abuser), possibly peptic ulcer (takes NSAIDs daily),  possibly other cause. He needs IV PPI drip, IV octreotide, at least 2 unit blood transfusion. Planning on EGD tomorrow.   Owens Loffler, MD [facility] Pager [phone]

## 2018-11-24 NOTE — ED Notes (Signed)
Attempted to call report nurse Lonn Georgia unable to take at this time

## 2018-11-24 NOTE — ED Notes (Signed)
ED TO INPATIENT HANDOFF REPORT  Name/Age/Gender Levi Alvarez 48 y.o. male  Code Status   Home/SNF/Other Home  Chief Complaint Emesis  Level of Care/Admitting Diagnosis ED Disposition    ED Disposition Condition [facility] Area: [place] [[id number]]  Level of Care: Stepdown [14]  Admit to SDU based on following criteria: Hemodynamic compromise or significant risk of instability:  Patient requiring short term acute titration and management of vasoactive drips, and invasive monitoring (i.e., CVP and Arterial line).  Diagnosis: Hematemesis [578.0.ICD-9-CM]  Admitting Physician: Bonnielee Haff [[id number]]  Attending Physician: Bonnielee Haff [[id number]]  Estimated length of stay: past midnight tomorrow  Certification:: I certify this patient will need inpatient services for at least 2 midnights  PT Class (Do Not Modify): Inpatient [101]  PT Acc Code (Do Not Modify): Private [1]       Medical History Past Medical History:  Diagnosis Date  . Hepatitis C antibody positive in blood   . High blood pressure     Allergies Allergies  Allergen Reactions  . Naproxen Hives and Swelling    IV Location/Drains/Wounds Patient Lines/Drains/Airways Status   Active Line/Drains/Airways    Name:   Placement date:   Placement time:   Site:   Days:   Peripheral IV 11/24/18 Left Antecubital   11/24/18    1520    Antecubital   less than 1   Peripheral IV 11/24/18 Left Hand   11/24/18    1849    Hand   less than 1   Peripheral IV 11/24/18 Right Antecubital   11/24/18    1851    Antecubital   less than 1          Labs/Imaging Results for orders placed or performed during the hospital encounter of 11/24/18 (from the past 48 hour(s))  Comprehensive metabolic panel     Status: Abnormal   Collection Time: 11/24/18  3:19 PM  Result Value Ref Range   Sodium 141 135 - 145 mmol/L   Potassium 5.1 3.5 - 5.1 mmol/L   Chloride 107 98 - 111 mmol/L   CO2 24 22 - 32  mmol/L   Glucose, Bld 151 (H) 70 - 99 mg/dL   BUN 20 6 - 20 mg/dL   Creatinine, Ser 0.68 0.61 - 1.24 mg/dL   Calcium 8.4 (L) 8.9 - 10.3 mg/dL   Total Protein 7.1 6.5 - 8.1 g/dL   Albumin 3.0 (L) 3.5 - 5.0 g/dL   AST 109 (H) 15 - 41 U/L   ALT 39 0 - 44 U/L   Alkaline Phosphatase 69 38 - 126 U/L   Total Bilirubin 1.2 0.3 - 1.2 mg/dL   GFR calc non Af Amer >60 >60 mL/min   GFR calc Af Amer >60 >60 mL/min   Anion gap 10 5 - 15    Comment: Performed at [facility], [place] [address], [place], [place] [address]  CBC with Differential     Status: Abnormal   Collection Time: 11/24/18  3:19 PM  Result Value Ref Range   WBC 10.0 4.0 - 10.5 K/uL   RBC 2.84 (L) 4.22 - 5.81 MIL/uL   Hemoglobin 6.8 (LL) 13.0 - 17.0 g/dL    Comment: REPEATED TO VERIFY THIS CRITICAL RESULT HAS VERIFIED AND BEEN CALLED TO B.JESSEE BY NATHAN THOMPSON ON 01 13 2020 AT [id number], AND HAS BEEN READ BACK. CRITICAL RESULT VERIFIED    HCT 23.5 (L) 39.0 - 52.0 %   MCV  82.7 80.0 - 100.0 fL   MCH 23.9 (L) 26.0 - 34.0 pg   MCHC 28.9 (L) 30.0 - 36.0 g/dL   RDW 21.6 (H) 11.5 - 15.5 %   Platelets 134 (L) 150 - 400 K/uL   nRBC 0.0 0.0 - 0.2 %   Neutrophils Relative % 69 %   Neutro Abs 6.9 1.7 - 7.7 K/uL   Lymphocytes Relative 16 %   Lymphs Abs 1.6 0.7 - 4.0 K/uL   Monocytes Relative 13 %   Monocytes Absolute 1.2 (H) 0.1 - 1.0 K/uL   Eosinophils Relative 0 %   Eosinophils Absolute 0.0 0.0 - 0.5 K/uL   Basophils Relative 1 %   Basophils Absolute 0.1 0.0 - 0.1 K/uL   Immature Granulocytes 1 %   Abs Immature Granulocytes 0.07 0.00 - 0.07 K/uL   Polychromasia PRESENT    Target Cells PRESENT     Comment: Performed at [facility], [place] [address], [place], [place] [address]  Lipase, blood     Status: Abnormal   Collection Time: 11/24/18  3:19 PM  Result Value Ref Range   Lipase 83 (H) 11 - 51 U/L    Comment: Performed at [facility], [place] [address] [place], [place]  [address]  Protime-INR     Status: Abnormal   Collection Time: 11/24/18  3:19 PM  Result Value Ref Range   Prothrombin Time 16.3 (H) 11.4 - 15.2 seconds   INR 1.32     Comment: Performed at [facility], [place] [address], [place], [place] [address]  ABO/Rh     Status: None   Collection Time: 11/24/18  3:19 PM  Result Value Ref Range   ABO/RH(D)      A POS Performed at [facility], [place] [address], [place], [place] [address]   Type and screen     Status: None (Preliminary result)   Collection Time: 11/24/18  3:23 PM  Result Value Ref Range   ABO/RH(D) A POS    Antibody Screen NEG    Sample Expiration      11/27/2018 Performed at [facility], [place] [address] [place], [place] [address]    Unit Number [id number]    Blood Component Type RED CELLS,LR    Unit division 00    Status of Unit ALLOCATED    Transfusion Status OK TO TRANSFUSE    Crossmatch Result Compatible    Unit Number [id number]    Blood Component Type RED CELLS,LR    Unit division 00    Status of Unit ALLOCATED    Transfusion Status OK TO TRANSFUSE    Crossmatch Result Compatible   Prepare RBC     Status: None   Collection Time: 11/24/18  4:34 PM  Result Value Ref Range   Order Confirmation      ORDER PROCESSED BY BLOOD BANK Performed at [facility], [place] [address], [place],  [address]    No results found. None  Pending Labs [employer] (From admission, onward)    Start     Ordered   Signed and Held  HIV antibody (Routine Testing)  Tomorrow morning,   R     Signed and Held   Signed and Held  CBC  Now then every 6 hours,   R     Signed and Held   Signed and Held  Comprehensive metabolic panel  Tomorrow morning,   R     Signed and  Held          Vitals/Pain Today's Vitals   11/24/18 1900 11/24/18 1945 11/24/18 2015 11/24/18 2045  BP: (!) 146/79 127/83 (!) 150/71 (!) 151/71  Pulse: 98 (!) 103 (!) 104 (!) 102   Resp: (!) 9 (!) 21 16 16   Temp:      TempSrc:      SpO2: (!) 87% 97% 94% 95%    Isolation Precautions No active isolations  Medications Medications  0.9 %  sodium chloride infusion (has no administration in time range)  pantoprazole (PROTONIX) 80 mg in sodium chloride 0.9 % 250 mL (0.32 mg/mL) infusion (8 mg/hr Intravenous Transfusing/Transfer 11/24/18 2115)  pantoprazole (PROTONIX) injection 40 mg (has no administration in time range)  octreotide (SANDOSTATIN) 2 mcg/mL load via infusion 50 mcg (50 mcg Intravenous Bolus from Bag 11/24/18 1856)    And  octreotide (SANDOSTATIN) 500 mcg in sodium chloride 0.9 % 250 mL (2 mcg/mL) infusion (50 mcg/hr Intravenous Transfusing/Transfer 11/24/18 2115)  sodium chloride 0.9 % bolus 1,000 mL (0 mLs Intravenous Stopped 11/24/18 2114)  pantoprazole (PROTONIX) injection 40 mg (40 mg Intravenous Given 11/24/18 1528)  ondansetron (ZOFRAN) injection 4 mg (4 mg Intravenous Given 11/24/18 1528)    Mobility walks

## 2018-11-24 NOTE — ED Triage Notes (Signed)
Transported by [facility] from home-- n/v (hematemesis)  and mild abdominal pain that started today. Patient typically consumes 12-18 beers per day but denies any ETOH use today. + orthostatics with EMS. EMS administered 500 cc of NS and 8 mg of Zofran IV PTA. 18 G IV in left AC.

## 2018-11-24 NOTE — ED Notes (Signed)
Date and time results received: 11/24/18 1634 (use smartphrase ".now" to insert current time)  Test: hgb Critical Value: 6.8  Name of Provider Notified: Dr Stark Jock  Orders Received? Or Actions Taken?: Actions Taken: notified Dr Stark Jock via phone of hgb 6.8

## 2018-11-24 NOTE — H&P (Signed)
[facility] History and Physical  Levi Alvarez QQP:[id number] DOB: August 10, 1971 DOA: 11/24/2018   PCP: Gildardo Pounds, NP  Specialists: Dr. Havery Moros is his gastroenterologist.  Patient is also followed by Dr. Donnetta Hutching with vascular surgery.  Followed by Dr. Lorin Mercy for chronic knee and back pain  Chief Complaint: Vomited blood  HPI: Levi Alvarez is a 48 y.o. male with a past medical history of alcoholism, liver cirrhosis, history of COPD, history of hepatitis C who was in his usual state of health last night when he started feeling dizzy and lightheaded.  He denies any syncopal episodes.  He also mentioned that he has noted black-colored stool for the past 2 days.  This morning when he woke up he went to the bathroom feeling extremely lightheaded.  He then proceeded on to vomit out a large quantity of blood.  He was brought into the emergency department.  He had another episode of bloody emesis in the emergency department.  He denies any abdominal pain.  No history of acid reflux.  No NSAID use recently.  However he did admit to using BC powder.  He continues to drink alcohol heavily consuming up to 18 beers a day.  Denies any bright red blood per rectum.  He mentioned that he underwent endoscopy last year.  Apparently this was done due to a diagnosis of liver cirrhosis for surveillance purposes.  No varices were appreciated on that endoscopy.  He was found to have gastritis.  In the emergency department patient was found to be anemic with a hemoglobin of 6.8.  He was hemodynamically stable.  He was seen by gastroenterology.  He will need hospitalization for further management.  Home Medications: Prior to Admission medications   Medication Sig Start Date End Date Taking? Authorizing Provider  albuterol (PROVENTIL) (2.5 MG/3ML) 0.083% nebulizer solution Take 3 mLs (2.5 mg total) by nebulization every 6 (six) hours as needed for wheezing or shortness of breath. 07/22/18   Gildardo Pounds, NP    BREO ELLIPTA 200-25 MCG/INH AEPB INHALE 1 PUFF DAILY INTO THE LUNGS. 09/11/18   Gildardo Pounds, NP  carvedilol (COREG) 3.125 MG tablet Take 1 tablet (3.125 mg total) by mouth 2 (two) times daily. 09/11/18 10/11/18  Gildardo Pounds, NP  diazepam (VALIUM) 5 MG tablet Take as directed prior to procedure. 11/03/18   Marybelle Killings, MD  fluticasone furoate-vilanterol (BREO ELLIPTA) 200-25 MCG/INH AEPB Inhale 1 puff into the lungs daily. 11/01/17   Gildardo Pounds, NP  lidocaine (LIDODERM) 5 % Place 1 patch onto the skin daily. Remove & Discard patch within 12 hours or as directed by MD 08/05/18   Gildardo Pounds, NP  Multiple Vitamin (MULTI-VITAMINS) TABS Take 1 tablet by mouth 1 day or 1 dose. 04/04/15   [provider]  spironolactone (ALDACTONE) 50 MG tablet Take 1 tablet (50 mg total) by mouth daily. 10/17/18   Gildardo Pounds, NP  tadalafil, PAH, (ADCIRCA) 20 MG tablet TAKE 1/2-1 TABLET BY MOUTH EVERY OTHER DAY AS NEEDED FOR ERECTILE DYSFUNCTION. 11/03/18   Gildardo Pounds, NP  VENTOLIN HFA 108 (90 Base) MCG/ACT inhaler INHALE 1 PUFF EVERY 6 (SIX) HOURS AS NEEDED INTO THE LUNGS FOR WHEEZING OR SHORTNESS OF BREATH. 06/19/18   Gildardo Pounds, NP    Allergies:  Allergies  Allergen Reactions  . Naproxen Hives and Swelling    Past Medical History: Past Medical History:  Diagnosis Date  . Hepatitis C antibody positive in blood   .  High blood pressure     Past Surgical History:  Procedure Laterality Date  . arm surgery    . KNEE SURGERY      Social History: Patient lives with his fiance.  He drinks up to 18 beers a day.  He also has history of smoking.  Family History:  Family History  Problem Relation Age of Onset  . Colitis Mother   . Arthritis Mother   . Cancer Father        lymphoma?  . Cancer Paternal Grandfather        bone  . Heart disease Neg Hx   . Stroke Neg Hx   . Diabetes Neg Hx      Review of Systems - History obtained from the patient General ROS:  positive for  - fatigue Psychological ROS: negative Ophthalmic ROS: negative ENT ROS: negative Allergy and Immunology ROS: negative Hematological and Lymphatic ROS: negative Endocrine ROS: negative Respiratory ROS: no cough, shortness of breath, or wheezing Cardiovascular ROS: no chest pain or dyspnea on exertion Gastrointestinal ROS: as in hpi Genito-Urinary ROS: no dysuria, trouble voiding, or hematuria Musculoskeletal ROS: negative Neurological ROS: no TIA or stroke symptoms Dermatological ROS: negative  Physical Examination  Vitals:   11/24/18 1615 11/24/18 1630 11/24/18 1645 11/24/18 1700  BP: 119/78 120/77 107/63 122/75  Pulse: 99 (!) 103 97 (!) 106  Resp:   15 16  Temp:      TempSrc:      SpO2: 97% 98% 99% 97%    BP 122/75   Pulse (!) 106   Temp 98.7 F (37.1 C) (Oral)   Resp 16   SpO2 97%   General appearance: alert, cooperative, appears stated age and no distress Head: Normocephalic, without obvious abnormality, atraumatic Eyes: Pallor.  No icterus.  Pupils equal reacting.  Extraocular movements intact. Throat: lips, mucosa, and tongue normal; teeth and gums normal Neck: no adenopathy, no carotid bruit, no JVD, supple, symmetrical, trachea midline and thyroid not enlarged, symmetric, no tenderness/mass/nodules Resp: clear to auscultation bilaterally Cardio: regular rate and rhythm, S1, S2 normal, no murmur, click, rub or gallop GI: soft, non-tender; bowel sounds normal; no masses,  no organomegaly Extremities: extremities normal, atraumatic, no cyanosis or edema Pulses: 2+ and symmetric Skin: Skin color, texture, turgor normal. No rashes or lesions Lymph nodes: Cervical, supraclavicular, and axillary nodes normal. Neurologic: Alert and oriented x3.  Cranial nerves II to XII intact.  Motor strength equal bilateral upper and lower extremities.   Labs on Admission: I have personally reviewed following labs and imaging studies  CBC: Recent Labs  Lab  11/24/18 1519  WBC 10.0  NEUTROABS PENDING  HGB 6.8*  HCT 23.5*  MCV 82.7  PLT 338*   Basic Metabolic Panel: Recent Labs  Lab 11/24/18 1519  NA 141  K 5.1  CL 107  CO2 24  GLUCOSE 151*  BUN 20  CREATININE 0.68  CALCIUM 8.4*   GFR: Estimated Creatinine Clearance: 143.9 mL/min (by C-G formula based on SCr of 0.68 mg/dL). Liver Function Tests: Recent Labs  Lab 11/24/18 1519  AST 109*  ALT 39  ALKPHOS 69  BILITOT 1.2  PROT 7.1  ALBUMIN 3.0*   Recent Labs  Lab 11/24/18 1519  LIPASE 83*   Coagulation Profile: Recent Labs  Lab 11/24/18 1519  INR 1.32    Radiological Exams on Admission: No results found.    Problem List  Principal Problem:   Hematemesis Active Problems:   OSA (obstructive sleep apnea)  Polyarthralgia   Chronic hepatitis C without hepatic coma (HCC)   Cirrhosis (HCC)   Acute blood loss anemia   Alcohol abuse   Chronic back pain   Assessment: This is a 48 year old Caucasian male with a past medical history as stated earlier who presents with hematemesis.  Differential diagnosis includes peptic ulcer disease, alcoholic gastritis, variceal bleeding.  He has acute blood loss anemia.  Plan:  1. Hematemesis/upper GI bleed: Recent history of melanotic stools as well.  See above for differential.  Gastroenterology consulted.  Patient will be placed on PPI infusion along with octreotide infusion.  He will need endoscopy.  He will be monitored in the stepdown unit.  N.p.o. except ice chips.  2.  Acute blood loss anemia: Hemoglobin 6.8.  He will be transfused 2 units of blood to begin with.  Monitor CBC every 6 hours after transfusion.  3.  Liver cirrhosis thought to be secondary to alcohol.  Patient also has a history of hepatitis C.  He does have elevated AST which could be due to his heavy alcohol consumption.  Lipase level noted to be 83.  No tenderness in the epigastric area.  Holding spironolactone.  Hold his beta-blocker.  4. History of  alcoholism: Continues to drink heavily.  Patient was strongly counseled to stop drinking alcohol.  He is at risk for alcohol withdrawal.  He will be placed on CIWA protocol.  5. History of obstructive sleep apnea: CPAP when he is more stable and when he is no longer vomiting.  6. Chronic knee and chronic back pain: Continue to monitor.  DVT Prophylaxis: SCDs Code Status: Full code Family Communication: Discussed with the patient Consults called: Gastroenterology  Severity of Illness: The appropriate patient status for this patient is INPATIENT. Inpatient status is judged to be reasonable and necessary in order to provide the required intensity of service to ensure the patient's safety. The patient's presenting symptoms, physical exam findings, and initial radiographic and laboratory data in the context of their chronic comorbidities is felt to place them at high risk for further clinical deterioration. Furthermore, it is not anticipated that the patient will be medically stable for discharge from the hospital within 2 midnights of admission. The following factors support the patient status of inpatient.   " The patient's presenting symptoms include hematemesis. " The worrisome physical exam findings include pallor. " The initial radiographic and laboratory data are worrisome because of severe anemia. " The chronic co-morbidities include liver cirrhosis.   * I certify that at the point of admission it is my clinical judgment that the patient will require inpatient hospital care spanning beyond 2 midnights from the point of admission due to high intensity of service, high risk for further deterioration and high frequency of surveillance required.*  Further management decisions will depend on results of further testing and patient's response to treatment.   Christina Gintz [employer]  [facility] operational officer on [email or web address]  11/24/2018, 5:21 PM

## 2018-11-25 ENCOUNTER — Encounter (HOSPITAL_COMMUNITY): Payer: Self-pay

## 2018-11-25 ENCOUNTER — Inpatient Hospital Stay (HOSPITAL_COMMUNITY): Payer: Self-pay | Admitting: Anesthesiology

## 2018-11-25 ENCOUNTER — Ambulatory Visit (INDEPENDENT_AMBULATORY_CARE_PROVIDER_SITE_OTHER): Payer: Self-pay | Admitting: Orthopaedic Surgery

## 2018-11-25 ENCOUNTER — Encounter (HOSPITAL_COMMUNITY)
Admission: EM | Disposition: A | Discharge status: Home / Self Care | Payer: Self-pay | Source: Home / Self Care | Attending: Family Medicine

## 2018-11-25 ENCOUNTER — Other Ambulatory Visit: Payer: Self-pay

## 2018-11-25 HISTORY — PX: ESOPHAGEAL BANDING: SHX5518

## 2018-11-25 HISTORY — PX: ESOPHAGOGASTRODUODENOSCOPY (EGD) WITH PROPOFOL: SHX5813

## 2018-11-25 LAB — COMPREHENSIVE METABOLIC PANEL
ALK PHOS: 59 U/L (ref 38–126)
ALT: 35 U/L (ref 0–44)
AST: 93 U/L — ABNORMAL HIGH (ref 15–41)
Albumin: 2.9 g/dL — ABNORMAL LOW (ref 3.5–5.0)
Anion gap: 9 (ref 5–15)
BUN: 24 mg/dL — ABNORMAL HIGH (ref 6–20)
CO2: 24 mmol/L (ref 22–32)
Calcium: 7.8 mg/dL — ABNORMAL LOW (ref 8.9–10.3)
Chloride: 105 mmol/L (ref 98–111)
Creatinine, Ser: 0.89 mg/dL (ref 0.61–1.24)
GFR calc Af Amer: >60 mL/min (ref >60)
GFR calc non Af Amer: >60 mL/min (ref >60)
GLUCOSE: 126 mg/dL — AB (ref 70–99)
Potassium: 4.3 mmol/L (ref 3.5–5.1)
Sodium: 138 mmol/L (ref 135–145)
Total Bilirubin: 1.5 mg/dL — ABNORMAL HIGH (ref 0.3–1.2)
Total Protein: 6.2 g/dL — ABNORMAL LOW (ref 6.5–8.1)

## 2018-11-25 LAB — HIV ANTIBODY (ROUTINE TESTING W REFLEX): HIV Screen 4th Generation wRfx: NONREACTIVE

## 2018-11-25 LAB — CBC
HCT: 24.6 % — ABNORMAL LOW (ref 39.0–52.0)
HCT: 24.7 % — ABNORMAL LOW (ref 39.0–52.0)
Hemoglobin: 7.5 g/dL — ABNORMAL LOW (ref 13.0–17.0)
Hemoglobin: 7.6 g/dL — ABNORMAL LOW (ref 13.0–17.0)
MCH: 25.2 pg — ABNORMAL LOW (ref 26.0–34.0)
MCH: 25.7 pg — ABNORMAL LOW (ref 26.0–34.0)
MCHC: 30.5 g/dL (ref 30.0–36.0)
MCHC: 30.8 g/dL (ref 30.0–36.0)
MCV: 82.6 fL (ref 80.0–100.0)
MCV: 83.4 fL (ref 80.0–100.0)
Platelets: 87 10*3/uL — ABNORMAL LOW (ref 150–400)
Platelets: 91 10*3/uL — ABNORMAL LOW (ref 150–400)
RBC: 2.98 MIL/uL — ABNORMAL LOW (ref 4.22–5.81)
RBC: 2.96 MIL/uL — ABNORMAL LOW (ref 4.22–5.81)
RDW: 21.2 % — ABNORMAL HIGH (ref 11.5–15.5)
RDW: 21.4 % — ABNORMAL HIGH (ref 11.5–15.5)
WBC: 9.9 10*3/uL (ref 4.0–10.5)
WBC: 9.8 10*3/uL (ref 4.0–10.5)
nRBC: 0.4 % — ABNORMAL HIGH (ref 0.0–0.2)
nRBC: 0.3 % — ABNORMAL HIGH (ref 0.0–0.2)

## 2018-11-25 LAB — CBC WITH DIFFERENTIAL/PLATELET
Abs Immature Granulocytes: 0.06 10*3/uL (ref 0.00–0.07)
Basophils Absolute: 0.1 10*3/uL (ref 0.0–0.1)
Basophils Relative: 1 %
Eosinophils Absolute: 0 10*3/uL (ref 0.0–0.5)
Eosinophils Relative: 0 %
HCT: 24.2 % — ABNORMAL LOW (ref 39.0–52.0)
Hemoglobin: 7.4 g/dL — ABNORMAL LOW (ref 13.0–17.0)
Immature Granulocytes: 1 %
Lymphocytes Relative: 20 %
Lymphs Abs: 2.4 10*3/uL (ref 0.7–4.0)
MCH: 25 pg — ABNORMAL LOW (ref 26.0–34.0)
MCHC: 30.6 g/dL (ref 30.0–36.0)
MCV: 81.8 fL (ref 80.0–100.0)
MONOS PCT: 14 %
Monocytes Absolute: 1.6 10*3/uL — ABNORMAL HIGH (ref 0.1–1.0)
Neutro Abs: 7.6 10*3/uL (ref 1.7–7.7)
Neutrophils Relative %: 64 %
Platelets: 93 10*3/uL — ABNORMAL LOW (ref 150–400)
RBC: 2.96 MIL/uL — ABNORMAL LOW (ref 4.22–5.81)
RDW: 20.5 % — ABNORMAL HIGH (ref 11.5–15.5)
WBC: 11.8 10*3/uL — ABNORMAL HIGH (ref 4.0–10.5)
nRBC: 0.3 % — ABNORMAL HIGH (ref 0.0–0.2)

## 2018-11-25 SURGERY — ESOPHAGOGASTRODUODENOSCOPY (EGD) WITH PROPOFOL
Anesthesia: Monitor Anesthesia Care

## 2018-11-25 MED ORDER — HYDRALAZINE HCL 20 MG/ML IJ SOLN
10.0000 mg | Freq: Four times a day (QID) | INTRAMUSCULAR | Status: DC | PRN
  Administered 2018-11-25 (×2): 10 mg via INTRAVENOUS
  Filled 2018-11-25 (×2): qty 1

## 2018-11-25 MED ORDER — CARVEDILOL 3.125 MG PO TABS
3.1250 mg | ORAL_TABLET | Freq: Two times a day (BID) | ORAL | Status: DC
  Administered 2018-11-25 – 2018-11-28 (×6): 3.125 mg via ORAL
  Filled 2018-11-25 (×6): qty 1

## 2018-11-25 MED ORDER — PROPOFOL 10 MG/ML IV BOLUS
INTRAVENOUS | Status: AC
  Filled 2018-11-25: qty 40

## 2018-11-25 MED ORDER — CLONIDINE HCL 0.1 MG PO TABS
0.1000 mg | ORAL_TABLET | Freq: Once | ORAL | Status: AC
  Administered 2018-11-25: 0.1 mg via ORAL
  Filled 2018-11-25: qty 1

## 2018-11-25 MED ORDER — LIDOCAINE HCL (PF) 2 % IJ SOLN
INTRAMUSCULAR | Status: DC | PRN
  Administered 2018-11-25: 30 mg via INTRADERMAL

## 2018-11-25 MED ORDER — PROPOFOL 10 MG/ML IV BOLUS
INTRAVENOUS | Status: DC | PRN
  Administered 2018-11-25 (×2): 40 mg via INTRAVENOUS
  Administered 2018-11-25 (×2): 20 mg via INTRAVENOUS
  Administered 2018-11-25 (×2): 40 mg via INTRAVENOUS
  Administered 2018-11-25 (×2): 50 mg via INTRAVENOUS
  Administered 2018-11-25 (×2): 20 mg via INTRAVENOUS
  Administered 2018-11-25: 50 mg via INTRAVENOUS
  Administered 2018-11-25: 100 mg via INTRAVENOUS
  Administered 2018-11-25: 20 mg via INTRAVENOUS

## 2018-11-25 MED ORDER — HYDRALAZINE HCL 20 MG/ML IJ SOLN: 10.0000 mg | INTRAMUSCULAR | Status: DC | PRN

## 2018-11-25 MED ORDER — LACTATED RINGERS IV SOLN
INTRAVENOUS | Status: DC
  Administered 2018-11-25: 13:00:00 via INTRAVENOUS

## 2018-11-25 MED ORDER — NICOTINE 14 MG/24HR TD PT24
14.0000 mg | MEDICATED_PATCH | Freq: Every day | TRANSDERMAL | Status: DC
  Administered 2018-11-25 – 2018-11-28 (×4): 14 mg via TRANSDERMAL
  Filled 2018-11-25 (×4): qty 1

## 2018-11-25 MED ORDER — SODIUM CHLORIDE 0.9 % IV SOLN
INTRAVENOUS | Status: DC
  Administered 2018-11-25: 03:00:00 via INTRAVENOUS

## 2018-11-25 MED ORDER — LACTULOSE 10 GM/15ML PO SOLN
20.0000 g | Freq: Two times a day (BID) | ORAL | Status: DC
  Administered 2018-11-25: 20 g via ORAL
  Filled 2018-11-25: qty 30

## 2018-11-25 MED ORDER — SODIUM CHLORIDE 0.9 % IV SOLN
2.0000 g | INTRAVENOUS | Status: DC
  Administered 2018-11-25 – 2018-11-28 (×4): 2 g via INTRAVENOUS
  Filled 2018-11-25 (×4): qty 2

## 2018-11-25 MED ORDER — PANTOPRAZOLE SODIUM 40 MG IV SOLR
40.0000 mg | Freq: Two times a day (BID) | INTRAVENOUS | Status: DC
  Administered 2018-11-25 – 2018-11-28 (×6): 40 mg via INTRAVENOUS
  Filled 2018-11-25 (×6): qty 40

## 2018-11-25 SURGICAL SUPPLY — 15 items

## 2018-11-25 NOTE — Transfer of Care (Signed)
Immediate Anesthesia Transfer of Care Note  Patient: Levi Alvarez  Procedure(s) Performed: ESOPHAGOGASTRODUODENOSCOPY (EGD) WITH PROPOFOL (N/A ) ESOPHAGEAL BANDING  Patient Location: PACU and Endoscopy Unit  Anesthesia Type:MAC  Level of Consciousness: awake, oriented and drowsy  Airway & Oxygen Therapy: Patient Spontanous Breathing and Patient connected to nasal cannula oxygen  Post-op Assessment: Report given to RN and Post -op Vital signs reviewed and stable  Post vital signs: Reviewed and stable  Last Vitals:  Vitals Value Taken Time  BP 147/97 11/25/2018  2:40 PM  Temp 37.3 C 11/25/2018  2:40 PM  Pulse 93 11/25/2018  2:44 PM  Resp 17 11/25/2018  2:44 PM  SpO2 98 % 11/25/2018  2:44 PM  Vitals shown include unvalidated device data.  Last Pain:  Vitals:   11/25/18 1440  TempSrc: Oral  PainSc: 0-No pain         Complications: No apparent anesthesia complications

## 2018-11-25 NOTE — Progress Notes (Addendum)
[facility] PROGRESS NOTE  Levi RAINERI MBW:[id number] DOB: [birth year] DOA: 11/24/2018  PCP: Gildardo Pounds, NP  Brief History/Interval Summary: 48 y.o. male with a past medical history of alcoholism, liver cirrhosis, history of COPD, history of hepatitis C who presented with hematemesis.  He was found to have acute blood loss anemia.  Patient was hospitalized for further management.  Gastroenterology was consulted.    Reason for Visit: Upper GI bleeding  Consultants: Gastroenterology  Procedures: EGD is planned for this hospital stay  Antibiotics: Will initiate ceftriaxone due to possibility of uresis  Subjective/Interval History: Patient states that he had another episode of vomiting last night which was bloody.  He denies any abdominal pain this morning.  Denies any shortness of breath.  ROS: No headaches  Objective:  Vital Signs  Vitals:   11/25/18 0337 11/25/18 0400 11/25/18 0800 11/25/18 0900  BP:   (!) 172/130 (!) 175/77  Pulse:  97 88 81  Resp:   (!) 21   Temp: 98.4 F (36.9 C)     TempSrc: Oral     SpO2:   97% 96%  Weight:      Height:        Intake/Output Summary (Last 24 hours) at 11/25/2018 0914 Last data filed at 11/25/2018 0800 Gross per 24 hour  Intake 1039.54 ml  Output 350 ml  Net 689.54 ml   Filed Weights   11/25/18 0242  Weight: 109.4 kg    General appearance: alert, cooperative, appears stated age and no distress Head: Normocephalic, without obvious abnormality, atraumatic Resp: clear to auscultation bilaterally Cardio: regular rate and rhythm, S1, S2 normal, no murmur, click, rub or gallop GI: soft, non-tender; bowel sounds normal; no masses,  no organomegaly Extremities: extremities normal, atraumatic, no cyanosis or edema Pulses: 2+ and symmetric Neurologic: Alert and oriented x3.  No focal neurological deficits.  Lab Results:  Data Reviewed: I have personally reviewed following labs and imaging studies  CBC: Recent  Labs  Lab 11/24/18 1519 11/25/18 0652  WBC 10.0 11.8*  NEUTROABS 6.9 7.6  HGB 6.8* 7.4*  HCT 23.5* 24.2*  MCV 82.7 81.8  PLT 134* 93*    Basic Metabolic Panel: Recent Labs  Lab 11/24/18 1519 11/25/18 0652  NA 141 138  K 5.1 4.3  CL 107 105  CO2 24 24  GLUCOSE 151* 126*  BUN 20 24*  CREATININE 0.68 0.89  CALCIUM 8.4* 7.8*    GFR: Estimated Creatinine Clearance: 128.5 mL/min (by C-G formula based on SCr of 0.89 mg/dL).  Liver Function Tests: Recent Labs  Lab 11/24/18 1519 11/25/18 0652  AST 109* 93*  ALT 39 35  ALKPHOS 69 59  BILITOT 1.2 1.5*  PROT 7.1 6.2*  ALBUMIN 3.0* 2.9*    Recent Labs  Lab 11/24/18 1519  LIPASE 83*    Coagulation Profile: Recent Labs  Lab 11/24/18 1519  INR 1.32     Radiology Studies: No results found.   Medications:  Scheduled: . fluticasone furoate-vilanterol  1 puff Inhalation Daily  . folic acid  1 mg Oral Daily  . LORazepam  0-4 mg Intravenous Q6H   Followed by  . [START ON 11/26/2018] LORazepam  0-4 mg Intravenous Q12H  . multivitamin with minerals  1 tablet Oral Daily  . [START ON 11/28/2018] pantoprazole  40 mg Intravenous Q12H  . thiamine  100 mg Oral Daily   Or  . thiamine  100 mg Intravenous Daily   Continuous: . sodium chloride    .  sodium chloride 20 mL/hr at 11/25/18 0800  . sodium chloride Stopped (11/24/18 2326)  . cefTRIAXone (ROCEPHIN)  IV    . octreotide  (SANDOSTATIN)    IV infusion 50 mcg/hr (11/25/18 0800)  . pantoprozole (PROTONIX) infusion 8 mg/hr (11/25/18 0800)   EMV:[id number] **OR** acetaminophen, albuterol, hydrALAZINE, LORazepam **OR** LORazepam, ondansetron **OR** ondansetron (ZOFRAN) IV    Assessment/Plan:    Upper GI bleed with hematemesis and melanotic stool Differential diagnoses include bleeding due to peptic ulcer disease, variceal bleeding, alcoholic gastritis.  Patient is n.p.o.  Gastroenterology has seen the patient and plan is for upper endoscopy during this  hospital stay.  He appears to be hemodynamically stable currently.  He received 2 units of blood transfusion last night.  He is on PPI and octreotide infusions.  Ceftriaxone will be added due to possibility of variceal bleed.    Acute blood loss anemia Hemoglobin was 6.8 at presentation.  He was transfused 2 units of blood.  Hemoglobin this morning is 7.4.  We will recheck later today and transfuse if needed.  Elevated blood pressure Could be due to withdrawal process.  Unknown if the patient has a history of hypertension.  He is noted to be on carvedilol at home.  We will give him hydralazine as needed for now.  History of liver cirrhosis thought to be secondary to alcohol Patient apparently also has a history of hepatitis C.  Holding his spironolactone and beta-blocker for now.  Elevated AST due to alcohol consumption.  Improved this morning.  Bili noted to be slightly elevated.  Thrombocytopenia Drop in platelet counts noted.  Continue to monitor for now.  He is not getting any heparin products.  History of alcoholism with alcohol withdrawal Patient is mildly tremulous.  He has high blood pressure which could be suggestive of withdrawal process.  He is on CIWA protocol.  He is on thiamine.  History of obstructive sleep apnea CPAP when he is more stable and not vomiting.  Chronic knee and chronic back pain Stable.  History of COPD Continue inhalers.  Stable respiratory status.  DVT Prophylaxis: SCDs    Code Status: Full code Family Communication: Discussed with the patient Disposition Plan: Management as outlined above.  Await further GI input.    LOS: 1 day   Sam Overbeck [employer] on [email or web address]  11/25/2018, 9:14 AM

## 2018-11-25 NOTE — Progress Notes (Signed)
Received from endo .Alert.

## 2018-11-25 NOTE — Anesthesia Preprocedure Evaluation (Addendum)
Anesthesia Evaluation  Patient identified by MRN, date of birth, ID band Patient confused    Reviewed: Allergy & Precautions, NPO status , Patient's Chart, lab work & pertinent test results  History of Anesthesia Complications Negative for: history of anesthetic complications  Airway Mallampati: II  TM Distance: >3 FB Neck ROM: Full    Dental  (+) Dental Advisory Given, Poor Dentition,    Pulmonary sleep apnea , Current Smoker,    Pulmonary exam normal breath sounds clear to auscultation       Cardiovascular hypertension, Pt. on medications Normal cardiovascular exam Rhythm:Regular Rate:Normal     Neuro/Psych Mild bilateral hand tremor, asterixis    GI/Hepatic negative GI ROS, (+) Cirrhosis     substance abuse  alcohol use, Hepatitis -, CEtOH abuse currently with symptoms of withdrawal (tremors), on CIWA protocol Upper GI bleed, ?esophageal varices   Endo/Other  negative endocrine ROS  Renal/GU negative Renal ROS     Musculoskeletal negative musculoskeletal ROS (+)   Abdominal   Peds  Hematology  (+) anemia , Hgb 7.4, plts 93 on 11/25/18   Anesthesia Other Findings Day of surgery medications reviewed with the patient.  Reproductive/Obstetrics                          Anesthesia Physical Anesthesia Plan  ASA: III and emergent  Anesthesia Plan: MAC   Post-op Pain Management:    Induction:   PONV Risk Score and Plan: Treatment may vary due to age or medical condition and Propofol infusion  Airway Management Planned: Natural Airway and Simple Face Mask  Additional Equipment:   Intra-op Plan:   Post-operative Plan:   Informed Consent: I have reviewed the patients History and Physical, chart, labs and discussed the procedure including the risks, benefits and alternatives for the proposed anesthesia with the patient or authorized representative who has indicated his/her  understanding and acceptance.     Dental advisory given  Plan Discussed with: CRNA  Anesthesia Plan Comments:        Anesthesia Quick Evaluation

## 2018-11-25 NOTE — Op Note (Addendum)
[facility] Patient Name: Levi Alvarez Procedure Date: 11/25/2018 MRN: [id number] Attending MD: Milus Banister , MD Date of Birth: October 14, 1971 CSN: [id number] Age: 48 Admit Type: Inpatient Procedure:                Upper GI endoscopy Indications:              Hematemesis, known cirrhosis, continue etoh abuse,                            EGD 2019 showed no varices Providers:                Milus Banister, MD, Cleda Daub, RN, Cherylynn Ridges, Technician, Karis Juba, CRNA Referring MD:              Medicines:                Monitored Anesthesia Care Complications:            No immediate complications. Estimated blood loss:                            None. Estimated Blood Loss:     Estimated blood loss: none. Procedure:                Pre-Anesthesia Assessment:                           - Prior to the procedure, a History and Physical                            was performed, and patient medications and                            allergies were reviewed. The patient's tolerance of                            previous anesthesia was also reviewed. The risks                            and benefits of the procedure and the sedation                            options and risks were discussed with the patient.                            All questions were answered, and informed consent                            was obtained. Prior Anticoagulants: The patient has                            taken no previous anticoagulant or antiplatelet  agents. ASA Grade Assessment: IV - A patient with                            severe systemic disease that is a constant threat                            to life. After reviewing the risks and benefits,                            the patient was deemed in satisfactory condition to                            undergo the procedure.                           After obtaining informed  consent, the endoscope was                            passed under direct vision. Throughout the                            procedure, the patient's blood pressure, pulse, and                            oxygen saturations were monitored continuously. The                            GIF-H190 ([id number]) Olympus gastroscope was                            introduced through the mouth, and advanced to the                            second part of duodenum. The upper GI endoscopy was                            accomplished without difficulty. The patient                            tolerated the procedure well. Scope In: Scope Out: Findings:      There was a moderate amount of slightly coffee ground gastric secretions       but no recent red blood and no blood clots in the UGI tract.      There was severe portal gastropathy changes throughout the stomach.      No gastric varices.      Large (> 5 mm) varices were found in the lower third of the esophagus,       one with 'red wales' at the distal aspect. Six bands were successfully       placed with complete eradication, resulting in deflation of varices.      The exam was otherwise without abnormality. Impression:               - Severe portal gastropathy.                           -  No gastric varices.                           - Large esophageal varices, very likely the source                            of his recent hematemesis. These were treated with                            placement of 6 variceal ligating bands Moderate Sedation:      Not Applicable - Patient had care per Anesthesia. Recommendation:           - Return to ICU for ongoing care.                           - I will stop PPI drip, change to IV Q 12 hours for                            now.                           - Continue octreotide drip, empiric antibiotics                            CIWA protocol for now.                           - Clear liquid diet. I've also started  lactulose                            orally, may have mild encephalopathy.                           - Transfuse blood products as needed.                           - He will need eventual repeat EGD in 5-6 weeks to                            repeat banding (if needed).                           - Absolutely most important is that he not resume                            drinking alcohol. Procedure Code(s):        --- Professional ---                           [phone], Esophagogastroduodenoscopy, flexible,                            transoral; with band ligation of esophageal/gastric  varices Diagnosis Code(s):        --- Professional ---                           I85.00, Esophageal varices without bleeding                           K92.0, Hematemesis CPT copyright 2018 [employer]. All rights reserved. The codes documented in this report are preliminary and upon coder review may  be revised to meet current compliance requirements. Milus Banister, MD 11/25/2018 2:28:06 PM This report has been signed electronically. Number of Addenda: 0

## 2018-11-25 NOTE — Anesthesia Postprocedure Evaluation (Signed)
Anesthesia Post Note  Patient: Levi Alvarez  Procedure(s) Performed: ESOPHAGOGASTRODUODENOSCOPY (EGD) WITH PROPOFOL (N/A ) ESOPHAGEAL BANDING     Patient location during evaluation: PACU Anesthesia Type: MAC Level of consciousness: awake and alert Pain management: pain level controlled Vital Signs Assessment: post-procedure vital signs reviewed and stable Respiratory status: spontaneous breathing, nonlabored ventilation and respiratory function stable Cardiovascular status: stable and blood pressure returned to baseline Postop Assessment: no apparent nausea or vomiting Anesthetic complications: no    Last Vitals:  Vitals:   11/25/18 1445 11/25/18 1522  BP:  (!) 185/70  Pulse: 94 91  Resp: (!) 26 (!) 22  Temp:    SpO2: 96% 98%    Last Pain:  Vitals:   11/25/18 1440  TempSrc: Oral  PainSc: 0-No pain                 Brennan Bailey

## 2018-11-25 NOTE — Interval H&P Note (Signed)
History and Physical Interval Note:  11/25/2018 1:44 PM  Levi Alvarez  has presented today for surgery, with the diagnosis of Hematemesis  The various methods of treatment have been discussed with the patient and family. After consideration of risks, benefits and other options for treatment, the patient has consented to  Procedure(s): ESOPHAGOGASTRODUODENOSCOPY (EGD) WITH PROPOFOL (N/A) as a surgical intervention .  The patient's history has been reviewed, patient examined, no change in status, stable for surgery.  I have reviewed the patient's chart and labs.  Questions were answered to the patient's satisfaction.     Milus Banister

## 2018-11-26 ENCOUNTER — Encounter (HOSPITAL_COMMUNITY): Payer: Self-pay | Admitting: Gastroenterology

## 2018-11-26 ENCOUNTER — Other Ambulatory Visit: Payer: Self-pay | Admitting: Nurse Practitioner

## 2018-11-26 DIAGNOSIS — K922 Gastrointestinal hemorrhage, unspecified: Secondary | ICD-10-CM

## 2018-11-26 DIAGNOSIS — G4733 Obstructive sleep apnea (adult) (pediatric): Secondary | ICD-10-CM

## 2018-11-26 DIAGNOSIS — B182 Chronic viral hepatitis C: Secondary | ICD-10-CM

## 2018-11-26 LAB — PROTIME-INR
INR: 1.25
Prothrombin Time: 15.5 seconds — ABNORMAL HIGH (ref 11.4–15.2)

## 2018-11-26 LAB — CBC
HCT: 24.5 % — ABNORMAL LOW (ref 39.0–52.0)
HCT: 24.7 % — ABNORMAL LOW (ref 39.0–52.0)
HCT: 24.8 % — ABNORMAL LOW (ref 39.0–52.0)
Hemoglobin: 7.4 g/dL — ABNORMAL LOW (ref 13.0–17.0)
Hemoglobin: 7.4 g/dL — ABNORMAL LOW (ref 13.0–17.0)
Hemoglobin: 7.4 g/dL — ABNORMAL LOW (ref 13.0–17.0)
MCH: 25.3 pg — ABNORMAL LOW (ref 26.0–34.0)
MCH: 25.5 pg — ABNORMAL LOW (ref 26.0–34.0)
MCH: 25.7 pg — ABNORMAL LOW (ref 26.0–34.0)
MCHC: 29.8 g/dL — ABNORMAL LOW (ref 30.0–36.0)
MCHC: 30 g/dL (ref 30.0–36.0)
MCHC: 30.2 g/dL (ref 30.0–36.0)
MCV: 84.3 fL (ref 80.0–100.0)
MCV: 84.5 fL (ref 80.0–100.0)
MCV: 86.1 fL (ref 80.0–100.0)
PLATELETS: 87 10*3/uL — AB (ref 150–400)
Platelets: 93 10*3/uL — ABNORMAL LOW (ref 150–400)
Platelets: 93 10*3/uL — ABNORMAL LOW (ref 150–400)
RBC: 2.88 MIL/uL — ABNORMAL LOW (ref 4.22–5.81)
RBC: 2.9 MIL/uL — ABNORMAL LOW (ref 4.22–5.81)
RBC: 2.93 MIL/uL — ABNORMAL LOW (ref 4.22–5.81)
RDW: 21.8 % — ABNORMAL HIGH (ref 11.5–15.5)
RDW: 22.2 % — ABNORMAL HIGH (ref 11.5–15.5)
RDW: 21.7 % — AB (ref 11.5–15.5)
WBC: 10.1 10*3/uL (ref 4.0–10.5)
WBC: 10.3 10*3/uL (ref 4.0–10.5)
WBC: 9.9 10*3/uL (ref 4.0–10.5)
nRBC: 0.2 % (ref 0.0–0.2)
nRBC: 0.2 % (ref 0.0–0.2)
nRBC: 0.3 % — ABNORMAL HIGH (ref 0.0–0.2)

## 2018-11-26 LAB — COMPREHENSIVE METABOLIC PANEL
ALT: 38 U/L (ref 0–44)
AST: 116 U/L — ABNORMAL HIGH (ref 15–41)
Albumin: 2.9 g/dL — ABNORMAL LOW (ref 3.5–5.0)
Alkaline Phosphatase: 55 U/L (ref 38–126)
Anion gap: 12 (ref 5–15)
BUN: 19 mg/dL (ref 6–20)
CO2: 20 mmol/L — ABNORMAL LOW (ref 22–32)
Calcium: 7.8 mg/dL — ABNORMAL LOW (ref 8.9–10.3)
Chloride: 105 mmol/L (ref 98–111)
Creatinine, Ser: 0.78 mg/dL (ref 0.61–1.24)
GFR calc non Af Amer: >60 mL/min (ref >60)
Glucose, Bld: 113 mg/dL — ABNORMAL HIGH (ref 70–99)
Potassium: 4 mmol/L (ref 3.5–5.1)
Sodium: 137 mmol/L (ref 135–145)
Total Bilirubin: 1.3 mg/dL — ABNORMAL HIGH (ref 0.3–1.2)
Total Protein: 6.2 g/dL — ABNORMAL LOW (ref 6.5–8.1)

## 2018-11-26 LAB — MRSA PCR SCREENING: MRSA by PCR: NEGATIVE

## 2018-11-26 LAB — PREPARE RBC (CROSSMATCH)

## 2018-11-26 LAB — AMMONIA: Ammonia: 84 umol/L — ABNORMAL HIGH (ref 9–35)

## 2018-11-26 MED ORDER — SODIUM CHLORIDE 0.9% IV SOLUTION
Freq: Once | INTRAVENOUS | Status: AC
  Administered 2018-11-26: 12:00:00 via INTRAVENOUS

## 2018-11-26 MED ORDER — LACTULOSE 10 GM/15ML PO SOLN
20.0000 g | Freq: Every day | ORAL | Status: DC
  Administered 2018-11-26 – 2018-11-28 (×3): 20 g via ORAL
  Filled 2018-11-26 (×3): qty 30

## 2018-11-26 MED ORDER — ALUM & MAG HYDROXIDE-SIMETH 200-200-20 MG/5ML PO SUSP
30.0000 mL | ORAL | Status: DC | PRN
  Administered 2018-11-26 – 2018-11-27 (×2): 30 mL via ORAL
  Filled 2018-11-26 (×2): qty 30

## 2018-11-26 NOTE — Progress Notes (Signed)
[facility] Progress Note    Since last GI note: EGD yesterday, see full report in epic.  This morning he was sleeping but easily aroused.  A bit nauseas but no vomiting.  No overt bleeding since EGD.  No abd pains.  Mentating very clearly.  Objective: Vital signs in last 24 hours: Temp:  [98 F (36.7 C)-99.2 F (37.3 C)] 98 F (36.7 C) (01/15 0405) Pulse Rate:  [76-96] 76 (01/15 0000) Resp:  [15-26] 22 (01/15 0000) BP: (147-202)/(70-130) 150/88 (01/15 0000) SpO2:  [96 %-100 %] 97 % (01/15 0000) Last BM Date: 11/23/18 General: alert and oriented times 3 Heart: regular rate and rythm Abdomen: soft, non-tender, non-distended, normal bowel sounds   Lab Results: Recent Labs    11/25/18 1156 11/25/18 1745 11/26/18 0019  WBC 9.9 9.8 10.3  HGB 7.5* 7.6* 7.4*  PLT 87* 91* 93*  MCV 82.6 83.4 84.3   Recent Labs    11/24/18 1519 11/25/18 0652 11/26/18 0623  NA 141 138 137  K 5.1 4.3 4.0  CL 107 105 105  CO2 24 24 20*  GLUCOSE 151* 126* 113*  BUN 20 24* 19  CREATININE 0.68 0.89 0.78  CALCIUM 8.4* 7.8* 7.8*   Recent Labs    11/24/18 1519 11/25/18 0652 11/26/18 0623  PROT 7.1 6.2* 6.2*  ALBUMIN 3.0* 2.9* 2.9*  AST 109* 93* 116*  ALT 39 35 38  ALKPHOS 69 59 55  BILITOT 1.2 1.5* 1.3*   Recent Labs    11/24/18 1519 11/26/18 0623  INR 1.32 1.25     Studies/Results: No results found.   Medications: Scheduled Meds: . carvedilol  3.125 mg Oral BID WC  . fluticasone furoate-vilanterol  1 puff Inhalation Daily  . folic acid  1 mg Oral Daily  . lactulose  20 g Oral BID  . LORazepam  0-4 mg Intravenous Q6H   Followed by  . LORazepam  0-4 mg Intravenous Q12H  . multivitamin with minerals  1 tablet Oral Daily  . nicotine  14 mg Transdermal Daily  . pantoprazole (PROTONIX) IV  40 mg Intravenous Q12H  . thiamine  100 mg Oral Daily   Or  . thiamine  100 mg Intravenous Daily   Continuous Infusions: . sodium chloride 75 mL/hr at 11/26/18 0515  .  cefTRIAXone (ROCEPHIN)  IV Stopped (11/25/18 1015)  . octreotide  (SANDOSTATIN)    IV infusion 50 mcg/hr (11/26/18 0515)   PRN Meds:.acetaminophen **OR** acetaminophen, albuterol, hydrALAZINE, LORazepam **OR** LORazepam, ondansetron **OR** ondansetron (ZOFRAN) IV    Assessment/Plan: 48 y.o. male with etoh, hep C (eradicated) cirrhosis  Here with UGI bleeding from newly diagnosed esophageal varices. Ligating bands placed yesterday.  Hopefully he will not have overt rebleeding. Hb has drifted to 7.4 and I will order another 1 unit PRBC transfusion. Should stay on octreotide drip another 1-2 days.  IV PPI twice daily is adequate for today.  He will need eventual repeat EGD as an outpatient in several weeks to check for varices eradication and re-ligate if needed.  He drinks almost a case of beer per day and I suspect has a component of acute alcoholic hepatitis on top of his known cirrhosis.  He understand it is MOST important that he not resume drinking.  Continue CIWA protocol but he is mentating very clearly this AM, no apparent DTs.  I started lactulose yesterday at BID dosing, will decrease to once daily and may stop completely pending his clinical course.  SHould continue empiric abx for  now.  I will advance diet to low salt solids.  Probably safe for tx out of ICU later today if no overt rebleeding.    Milus Banister, MD  11/26/2018, 7:15 AM [facility] Pager [phone]

## 2018-11-26 NOTE — Progress Notes (Signed)
[facility] PROGRESS NOTE  HASHIR DELEEUW NUU:[id number] DOB: 07-Feb-1971 DOA: 11/24/2018 PCP: Gildardo Pounds, NP   Narrative: 48 year old Caucasian male Prior cirrhosis-previously followed by Dr. Havery Moros treated with Epclusa-previous EGD 12/2016 showed no varices Chronic alcoholism with 18 beers a day Morbid obesity BMI 34 (weight gain 2030 pounds over the past couple of months) Admitted with 3 episodes of bright bloody emesis as well as dark tarry stools for the past couple of weeks     A & Plan Hematemesis- secondary to variceal bleed, gave-6 variceal ligating bands placed-per GI-continue octreotide X 48 hours at least, continue PPI twice daily-graduated diet to low-salt solids-continuing ceftriaxone as per GI can discontinue when feels stable Acute blood loss anemia requiring transfusion getting 1 more unit of packed red blood cells Liver cirrhosis-status post Epclusa- understands relationship between alcoholism-I have discontinued Tylenol at this stage-continue lactulose 20 mg daily Alcoholism last drink allegedly on 1/11-CIWA scores in 3-4 Hypertension Not sure if this is a new problem continue Coreg 3.125 and clonidine 0.1 in addition to PRN hydralazine OSA-unsure if he uses a new machine Chronic knee back pain Morbid obesity BMI 34-monitor Process of getting disability  SCDs, inpatient, pending completion octreotide and stability of hemoglobin-likely transfer to telemetry stabilizing the next 24 hours   Verlon Au, MD  [facility] Direct contact: [phone] --Via amion app OR  --[email or web address]; password [email or web address]  7PM-7AM contact night coverage as above 11/26/2018, 8:42 AM  LOS: 2 days   Consultants:  Gastroenterology   Interval history/Subjective: Awake alert pleasant complaining of some nausea this morning but no pain, passing greenish colored stool No fever no chills Did not sleep at all last night  Objective:  Vitals:  Vitals:   11/26/18 0000  11/26/18 0405  BP: (!) 150/88   Pulse: 76   Resp: (!) 22   Temp:  98 F (36.7 C)  SpO2: 97%     Exam:  EOMI thick neck no pallor no icterus thick beard multiple tattoos over both upper extremities Neurologically grossly intact moving all 4 limbs S1-S2 no murmur rub or gallop Abdomen is somewhat soft nontender no rebound no guarding No lower extremity edema with some rashes   I have personally reviewed the following:  DATA   Labs:  Hemoglobin stable in the 7.4 range since yesterday  INR 1.2  CO2 29 BUN/creatinine 19/0.7 down from 24/0.8  AST 162 4 total protein 6.2   Medical tests: Endoscopy performed 1/14               Severe portal gastropathy. No gastric varices. Large esophageal varices, very likely the source of his recent hematemesis. These were treated with placement of 6 variceal ligating bands   Test discussed with performing physician:  n  Decision to obtain old records:  n  Review and summation of old records:  n  Scheduled Meds: . sodium chloride   Intravenous Once  . carvedilol  3.125 mg Oral BID WC  . fluticasone furoate-vilanterol  1 puff Inhalation Daily  . folic acid  1 mg Oral Daily  . lactulose  20 g Oral Daily  . LORazepam  0-4 mg Intravenous Q6H   Followed by  . LORazepam  0-4 mg Intravenous Q12H  . multivitamin with minerals  1 tablet Oral Daily  . nicotine  14 mg Transdermal Daily  . pantoprazole (PROTONIX) IV  40 mg Intravenous Q12H  . thiamine  100 mg Oral Daily   Or  . thiamine  100 mg Intravenous Daily  Continuous Infusions: . sodium chloride 75 mL/hr at 11/26/18 0515  . cefTRIAXone (ROCEPHIN)  IV Stopped (11/25/18 1015)  . octreotide  (SANDOSTATIN)    IV infusion 50 mcg/hr (11/26/18 0515)    Principal Problem:   Upper GI bleed Active Problems:   OSA (obstructive sleep apnea)   Polyarthralgia   Chronic hepatitis C without hepatic coma (HCC)   Cirrhosis (HCC)   Acute blood loss anemia   Alcohol abuse   Chronic  back pain   LOS: 2 days

## 2018-11-26 NOTE — Plan of Care (Addendum)
  Problem: Clinical Measurements: Goal: Diagnostic test results will improve Outcome: Progressing Goal: Cardiovascular complication will be avoided Outcome: Progressing   Problem: Activity: Goal: Risk for activity intolerance will decrease Outcome: Progressing   Problem: Coping: Goal: Level of anxiety will decrease Outcome: Progressing   Problem: Elimination: Goal: Will not experience complications related to urinary retention Outcome: Progressing   Problem: Pain Managment: Goal: General experience of comfort will improve Outcome: Progressing

## 2018-11-27 ENCOUNTER — Telehealth: Payer: Self-pay

## 2018-11-27 LAB — CBC WITH DIFFERENTIAL/PLATELET
Abs Immature Granulocytes: 0.13 10*3/uL — ABNORMAL HIGH (ref 0.00–0.07)
BASOS PCT: 1 %
Basophils Absolute: 0.1 10*3/uL (ref 0.0–0.1)
Eosinophils Absolute: 0.2 10*3/uL (ref 0.0–0.5)
Eosinophils Relative: 2 %
HCT: 27.8 % — ABNORMAL LOW (ref 39.0–52.0)
Hemoglobin: 8.8 g/dL — ABNORMAL LOW (ref 13.0–17.0)
Immature Granulocytes: 1 %
Lymphocytes Relative: 18 %
Lymphs Abs: 2.1 10*3/uL (ref 0.7–4.0)
MCH: 26.3 pg (ref 26.0–34.0)
MCHC: 31.7 g/dL (ref 30.0–36.0)
MCV: 83.2 fL (ref 80.0–100.0)
Monocytes Absolute: 1.1 10*3/uL — ABNORMAL HIGH (ref 0.1–1.0)
Monocytes Relative: 10 %
Neutro Abs: 8 10*3/uL — ABNORMAL HIGH (ref 1.7–7.7)
Neutrophils Relative %: 68 %
Platelets: 81 10*3/uL — ABNORMAL LOW (ref 150–400)
RBC: 3.34 MIL/uL — ABNORMAL LOW (ref 4.22–5.81)
RDW: 21.3 % — AB (ref 11.5–15.5)
WBC: 11.6 10*3/uL — ABNORMAL HIGH (ref 4.0–10.5)
nRBC: 0.5 % — ABNORMAL HIGH (ref 0.0–0.2)

## 2018-11-27 LAB — BPAM RBC
Blood Product Expiration Date: 202002042359
Blood Product Expiration Date: 202002042359
Blood Product Expiration Date: 202002082359
ISSUE DATE / TIME: 202001132142
ISSUE DATE / TIME: 202001132321
ISSUE DATE / TIME: 202001151210
UNIT TYPE AND RH: 6200
Unit Type and Rh: 6200
Unit Type and Rh: 6200

## 2018-11-27 LAB — RENAL FUNCTION PANEL
Albumin: 2.8 g/dL — ABNORMAL LOW (ref 3.5–5.0)
Anion gap: 7 (ref 5–15)
BUN: 13 mg/dL (ref 6–20)
CO2: 21 mmol/L — ABNORMAL LOW (ref 22–32)
Calcium: 7.5 mg/dL — ABNORMAL LOW (ref 8.9–10.3)
Chloride: 108 mmol/L (ref 98–111)
Creatinine, Ser: 0.81 mg/dL (ref 0.61–1.24)
GFR calc Af Amer: >60 mL/min (ref >60)
GFR calc non Af Amer: >60 mL/min (ref >60)
Glucose, Bld: 101 mg/dL — ABNORMAL HIGH (ref 70–99)
Phosphorus: 2.4 mg/dL — ABNORMAL LOW (ref 2.5–4.6)
Potassium: 3.6 mmol/L (ref 3.5–5.1)
Sodium: 136 mmol/L (ref 135–145)

## 2018-11-27 LAB — TYPE AND SCREEN
ABO/RH(D): A POS
Antibody Screen: NEGATIVE
Unit division: 0
Unit division: 0
Unit division: 0

## 2018-11-27 NOTE — Telephone Encounter (Signed)
Thanks Patty, I can take care of this, just not sure what date. Any way I can add him into my hospital case block for 2/4, if I can start at 730 or do a 1230 case? Thanks

## 2018-11-27 NOTE — Progress Notes (Signed)
[facility] PROGRESS NOTE  Levi Alvarez LNL:[id number] DOB: 1971/01/21 DOA: 11/24/2018 PCP: Gildardo Pounds, NP   Narrative: 48 year old Caucasian male Prior cirrhosis-previously followed by Dr. Havery Moros treated with Epclusa-previous EGD 12/2016 showed no varices Chronic alcoholism with 18 beers a day Morbid obesity BMI 34 (weight gain 2030 pounds over the past couple of months) Admitted with 3 episodes of bright bloody emesis as well as dark tarry stools for the past couple of weeks     A & Plan Hematemesis- secondary to variceal bleed, gave-6 variceal ligating bands placed-per GI continue PPI twice daily-treated diet if hemoglobin stabilizes and patient tolerating can probably discharge tomorrow off of antibiotics octreotide etc. Acute blood loss anemia requiring transfusion getting 1 more unit of packed red blood cells-good response to transfusion Liver cirrhosis-hepatitis C status post Epclusa- understands relationship between alcoholism-I have discontinued Tylenol at this stage-lactulose discontinued Alcoholism last drink allegedly on 1/11-CIWA scores in 0-5 but patient feels a little uncomfortable Hypertension Not sure if this is a new problem continue Coreg 3.125 and clonidine 0.1 in addition to PRN hydralazine OSA-unsure if he uses a new machine Chronic knee back pain Morbid obesity BMI 34-monitor Process of getting disability  SCDs, inpatient, monitoring overnight to ensure no further bleeding graduating diet probably discharge a.m.  Verlon Au, MD  [facility] Direct contact: [phone] --Via amion app OR  --[email or web address]; password [email or web address]  7PM-7AM contact night coverage as above 11/27/2018, 5:22 PM  LOS: 3 days   Consultants:  Gastroenterology   Interval history/Subjective:  Poor sleep overnight asking for something to help him rest Felt a little bit more jittery today but overall is improved No fever no chills  Objective:  Vitals:  Vitals:    11/27/18 0524 11/27/18 1450  BP: (!) 159/89 136/80  Pulse: 70 77  Resp: 18 18  Temp: 98.2 F (36.8 C) 98.3 F (36.8 C)  SpO2: 98% 100%    Exam:  Alert oriented pleasant no distress thick neck thick beard Chest clear Abdomen soft no rebound no guarding No lower extremity edema No obvious tremor   I have personally reviewed the following:  DATA   Labs:  Hemoglobin up from 7.4-8.8 after 1 unit  Platelet 81 WBC 11 basic metabolic panel relatively normal except slightly low calcium and bicarb of 21   Medical tests: Endoscopy performed 1/14               Severe portal gastropathy. No gastric varices. Large esophageal varices, very likely the source of his recent hematemesis. These were treated with placement of 6 variceal ligating bands   Test discussed with performing physician:  n  Decision to obtain old records:  n  Review and summation of old records:  n  Scheduled Meds: . carvedilol  3.125 mg Oral BID WC  . fluticasone furoate-vilanterol  1 puff Inhalation Daily  . folic acid  1 mg Oral Daily  . lactulose  20 g Oral Daily  . LORazepam  0-4 mg Intravenous Q12H  . multivitamin with minerals  1 tablet Oral Daily  . nicotine  14 mg Transdermal Daily  . pantoprazole (PROTONIX) IV  40 mg Intravenous Q12H  . thiamine  100 mg Oral Daily   Or  . thiamine  100 mg Intravenous Daily   Continuous Infusions: . cefTRIAXone (ROCEPHIN)  IV Stopped (11/27/18 0954)  . octreotide  (SANDOSTATIN)    IV infusion 50 mcg/hr (11/27/18 1652)    Principal Problem:   Upper GI bleed Active Problems:  OSA (obstructive sleep apnea)   Polyarthralgia   Chronic hepatitis C without hepatic coma (HCC)   Cirrhosis (HCC)   Acute blood loss anemia   Alcohol abuse   Chronic back pain   LOS: 3 days

## 2018-11-27 NOTE — Progress Notes (Addendum)
     [facility] Progress Note  CC:  ETOH/Hep C cirrhosis with UGI bleed  Subjective:  Feels ok overall.  A little groggy and weak feeling at times, but has gotten up and moving around alone.  Had a decent sized BM yesterday that was dark in color, suspect old blood.  Objective:  Vital signs in last 24 hours: Temp:  [98 F (36.7 C)-99 F (37.2 C)] 98.2 F (36.8 C) (01/16 0524) Pulse Rate:  [63-80] 70 (01/16 0524) Resp:  [14-29] 18 (01/16 0524) BP: (123-162)/(80-104) 159/89 (01/16 0524) SpO2:  [96 %-100 %] 98 % (01/16 0524) Last BM Date: 11/26/18 General:  Alert, Well-developed, in NAD Heart:  Regular rate and rhythm; no murmurs Pulm:  CTAB.  No increased WOB. Abdomen:  Soft, obese, non-distended.  BS present.  Non-tender. Extremities:  Without edema. Neurologic:  Alert and oriented x 4;  grossly normal neurologically.   Lab Results: Recent Labs    11/26/18 0019 11/26/18 0623 11/27/18 0306  WBC 10.3 10.1  9.9 11.6*  HGB 7.4* 7.4*  7.4* 8.8*  HCT 24.7* 24.8*  24.5* 27.8*  PLT 93* 87*  93* 81*   BMET Recent Labs    11/25/18 0652 11/26/18 0623 11/27/18 0306  NA 138 137 136  K 4.3 4.0 3.6  CL 105 105 108  CO2 24 20* 21*  GLUCOSE 126* 113* 101*  BUN 24* 19 13  CREATININE 0.89 0.78 0.81  CALCIUM 7.8* 7.8* 7.5*   LFT Recent Labs    11/26/18 0623 11/27/18 0306  PROT 6.2*  --   ALBUMIN 2.9* 2.8*  AST 116*  --   ALT 38  --   ALKPHOS 55  --   BILITOT 1.3*  --    PT/INR Recent Labs    11/24/18 1519 11/26/18 0623  LABPROT 16.3* 15.5*  INR 1.32 1.25   Assessment / Plan: 48 y.o. male with etoh, hep C (eradicated) cirrhosis  Here with UGI bleeding from newly diagnosed esophageal varices. Ligating bands placed 1/14.  No sign of rebleeding. Hgb stable today after one unit PRBC's on 1/15.  Octreotide gtt ends this afternoon, which I think is ok.  IV PPI twice daily for now and be switched to PO upon discharge.  He will need eventual repeat  EGD as an outpatient in several weeks to check for varices eradication and re-ligate if needed.  Our office will contact him with that appt.   ? How long he needs to continue antibiotics.  Continue 2 gram/low sodium diet.   LOS: 3 days   Laban Emperor. Zehr  11/27/2018, 11:43 AM  ________________________________________________________________________  [facility] MD note:  I personally examined the patient, reviewed the data and agree with the assessment and plan described above.  He seems pretty deconditioned but this could just be his usual state of health.  He is interested in going home tomorrow if possible. That seems reasonable as long as no obvious rebleeding.  He should continue once daily oral PPI, indefintitely.  He should never resume drinking.  He should be on low salt diet, indefinitely.  I don't think he needs lactulose any longer and antibiotics can be stopped at discharge.   Our office will contact him about a repeat EGD for further banding in 4-6 weeks.  Please call or page with any further questions or concerns.    Owens Loffler, MD [facility] Pager [phone]

## 2018-11-27 NOTE — Telephone Encounter (Signed)
-----   Message from Milus Banister, MD sent at 11/27/2018 11:47 AM EST ----- He's actually an [facility] patient.  Should have rebanding with him.     Thanks ----- Message ----- From: Loralie Champagne, PA-C Sent: 11/27/2018  11:44 AM EST To: Milus Banister, MD, Timothy Lasso, RN  Sorry lol! ----- Message ----- From: Timothy Lasso, RN Sent: 11/27/2018  11:41 AM EST To: Loralie Champagne, PA-C  There is no patient attached.   ----- Message ----- From: Loralie Champagne, PA-C Sent: 11/27/2018  11:37 AM EST To: Milus Banister, MD, Timothy Lasso, RN  Patient had inpatient EGD with banding of varices.  Needs repeat EGD with Dr. Ardis Hughs as outpatient at [facility] again in about 4-5 weeks.  Can you please get this scheduled.  Dr. Ardis Hughs, do you want any other follow-up in the interim?  Thank you,  Jess

## 2018-11-27 NOTE — Progress Notes (Signed)
Patient arrived to 5E via stretcher. C/O of slight headache. Tele On, Box 51. Patient oriented to room and staff. Educated patient on plan of care. All questions answered. Patient resting comfortably. Will continue to monitor.

## 2018-11-27 NOTE — Telephone Encounter (Signed)
Dr Armbruster please advise. 

## 2018-11-28 ENCOUNTER — Other Ambulatory Visit: Payer: Self-pay

## 2018-11-28 DIAGNOSIS — K7460 Unspecified cirrhosis of liver: Secondary | ICD-10-CM

## 2018-11-28 DIAGNOSIS — K2970 Gastritis, unspecified, without bleeding: Secondary | ICD-10-CM

## 2018-11-28 LAB — CBC WITH DIFFERENTIAL/PLATELET
ABS IMMATURE GRANULOCYTES: 0.06 10*3/uL (ref 0.00–0.07)
BASOS PCT: 1 %
Basophils Absolute: 0.1 10*3/uL (ref 0.0–0.1)
EOS ABS: 0.2 10*3/uL (ref 0.0–0.5)
Eosinophils Relative: 2 %
HCT: 28.7 % — ABNORMAL LOW (ref 39.0–52.0)
HEMOGLOBIN: 8.5 g/dL — AB (ref 13.0–17.0)
Immature Granulocytes: 1 %
Lymphocytes Relative: 19 %
Lymphs Abs: 2.2 10*3/uL (ref 0.7–4.0)
MCH: 25.4 pg — ABNORMAL LOW (ref 26.0–34.0)
MCHC: 29.6 g/dL — ABNORMAL LOW (ref 30.0–36.0)
MCV: 85.7 fL (ref 80.0–100.0)
Monocytes Absolute: 1.3 10*3/uL — ABNORMAL HIGH (ref 0.1–1.0)
Monocytes Relative: 11 %
Neutro Abs: 7.7 10*3/uL (ref 1.7–7.7)
Neutrophils Relative %: 66 %
Platelets: 104 10*3/uL — ABNORMAL LOW (ref 150–400)
RBC: 3.35 MIL/uL — AB (ref 4.22–5.81)
RDW: 21.6 % — ABNORMAL HIGH (ref 11.5–15.5)
WBC: 11.6 10*3/uL — ABNORMAL HIGH (ref 4.0–10.5)
nRBC: 0.3 % — ABNORMAL HIGH (ref 0.0–0.2)

## 2018-11-28 MED ORDER — PANTOPRAZOLE SODIUM 40 MG PO TBEC: 40.0000 mg | DELAYED_RELEASE_TABLET | Freq: Every day | ORAL | 2 refills | Status: DC

## 2018-11-28 MED ORDER — ALUM & MAG HYDROXIDE-SIMETH 200-200-20 MG/5ML PO SUSP: 30.0000 mL | ORAL | 0 refills | Status: DC | PRN

## 2018-11-28 MED FILL — PANTOPRAZOLE SOD DR 40 MG T: 40 | 30 days supply | Qty: 30 | Fill #0

## 2018-11-28 NOTE — Telephone Encounter (Signed)
The pt has been scheduled for repeat banding with Dr Havery Moros for 12/16/18 at 730 am at [facility].  Left message on machine to call back

## 2018-11-28 NOTE — Telephone Encounter (Signed)
EGD scheduled, pt instructed and medications reviewed.  Patient instructions mailed to home.  Patient to call with any questions or concerns.  

## 2018-11-28 NOTE — Discharge Summary (Signed)
Physician Discharge Summary  BARDIA WANGERIN XLK:[id number] DOB: [birth year] DOA: 11/24/2018  PCP: Gildardo Pounds, NP  Admit date: 11/24/2018 Discharge date: 11/28/2018  Time spent: 35 minutes  Recommendations for Outpatient Follow-up:  1. Continue Protonix for at least 1 to 2 months and GI to follow-up 2. Will need further counseling regarding cirrhosis he does not seem to have a clear understanding of fluid restriction and diuretic 3. Will probably need further scope of esophagus to ensure varices not bleeding 4. Recommend basic metabolic panel and CBC in less than 1 week 5. Recommended fluid restriction 1500 cc and slight counseling done regarding the same 6. Consider nonselective beta-blocker for variceal detection 7. Recommend screening with stop bang or Epworth for sleep apnea  Discharge Diagnoses:  Principal Problem:   Upper GI bleed Active Problems:   OSA (obstructive sleep apnea)   Polyarthralgia   Chronic hepatitis C without hepatic coma (HCC)   Cirrhosis (HCC)   Acute blood loss anemia   Alcohol abuse   Chronic back pain   Discharge Condition: Improved  Diet recommendation: Heart healthy low-salt 1500 cc restricted  [employer]   11/25/18 0242  Weight: 109.4 kg    History of present illness:  48 year old Caucasian male Prior cirrhosis-previously followed by Dr. Havery Moros treated with Epclusa-previous EGD 12/2016 showed no varices Chronic alcoholism with 18 beers a day Morbid obesity BMI 34 (weight gain 2030 pounds over the past couple of months) Admitted with 3 episodes of bright bloody emesis as well as dark tarry stools for the past couple of weeks  Hospital Course:  Hematemesis- secondary to variceal bleed, gave-6 variceal ligating bands placed-per GI continue PPI twice daily-treated diet if hemoglobin stabilizes and patient tolerating can probably discharge tomorrow off of antibiotics octreotide etc. Acute blood loss anemia requiring transfusion getting  1 more unit of packed red blood cells-good response to transfusion Liver cirrhosis-hepatitis C status post Epclusa- understands relationship between alcoholism-I have discontinued Tylenol at this stage-lactulose discontinued per GI in hospital but may resume as an outpatient if needed and I have left it on his MAR in addition we will continue his Aldactone and I gave him some counseling regarding fluid restriction Alcoholism last drink allegedly on 1/11-CIWA scores in 0-5 and patient overall has improved since admission Hypertension Not sure if this is a new problem continue Coreg 3.125 and clonidine 0.1 in addition to PRN hydralazine-At follow-up visit once the scope was done may determine if he needs a nonselective beta-blocker for his varices OSA-unsure if he uses a new machine Chronic knee back pain Morbid obesity BMI 34-monitor Process of getting disability  Labs:  Hemoglobin up from 7.4-8.8 after 1 unit of blood and on discharge was 8.5  Basic metabolic panel last done 3/66 was relatively normalized   Medical tests: Endoscopy performed 1/14 Severe portal gastropathy. No gastric varices. Large esophageal varices, very likely the source of his recent hematemesis. These were treated with placement of 6 variceal ligating bands   Consultations:  Gastroenterology  Discharge Exam: Vitals:   11/28/18 0440 11/28/18 0815  BP: 135/82   Pulse: 70   Resp: 18   Temp: 98.1 F (36.7 C)   SpO2: 99% 98%    Awake alert pleasant no distress eating drinking no nausea no vomiting EOMI NCAT thick neck Abdomen obese nontender cannot appreciate organomegaly No lower extremity edema No tenderness but does have some reflux-like symptoms  Discharge Instructions   Discharge Instructions    Diet - low sodium heart healthy  Complete by:  As directed    Discharge instructions   Complete by:  As directed    You have been recommended to absolutely quit drinking and I would  encourage you to follow those instructions I would recommend that you continue with Protonix medication twice a day at least for 2 to 3 weeks and make sure that if you do not hear from gastroenterology office in about 1 week then you call them to schedule a follow-up Please continue your fluid medications such as Aldactone and your lactulose and you will need to have a fluid restriction about 3-minute Pet bottles of soda or 1500 cc a day and you will need to stay away from salt because you have underlying cirrhosis which your gastroenterologist should be counseling Re: Best of luck and happy new year   Increase activity slowly   Complete by:  As directed      Allergies as of 11/28/2018      Reactions   Naproxen Hives, Swelling      Medication List    STOP taking these medications   BC FAST PAIN RELIEF PO   diazepam 5 MG tablet Commonly known as:  VALIUM     TAKE these medications   alum & mag hydroxide-simeth 200-200-20 MG/5ML suspension Commonly known as:  MAALOX/MYLANTA Take 30 mLs by mouth every 4 (four) hours as needed for indigestion or heartburn.   BREO ELLIPTA 200-25 MCG/INH Aepb Generic drug:  fluticasone furoate-vilanterol INHALE 1 PUFF DAILY INTO THE LUNGS. What changed:    See the new instructions.  Another medication with the same name was removed. Continue taking this medication, and follow the directions you see here.   carvedilol 3.125 MG tablet Commonly known as:  COREG Take 1 tablet (3.125 mg total) by mouth 2 (two) times daily.   lidocaine 5 % Commonly known as:  LIDODERM Place 1 patch onto the skin daily. Remove & Discard patch within 12 hours or as directed by MD   loratadine 10 MG tablet Commonly known as:  CLARITIN Take 10 mg by mouth daily.   MILK THISTLE PO Take 1 tablet by mouth daily.   MULTI-VITAMINS Tabs Take 1 tablet by mouth 1 day or 1 dose.   pantoprazole 40 MG tablet Commonly known as:  PROTONIX Take 1 tablet (40 mg total) by  mouth daily.   spironolactone 50 MG tablet Commonly known as:  ALDACTONE Take 1 tablet (50 mg total) by mouth daily.   tadalafil (PAH) 20 MG tablet Commonly known as:  ADCIRCA TAKE 1/2-1 TABLET BY MOUTH EVERY OTHER DAY AS NEEDED FOR ERECTILE DYSFUNCTION. What changed:  See the new instructions.   VENTOLIN HFA 108 (90 Base) MCG/ACT inhaler Generic drug:  albuterol INHALE 1 PUFF EVERY 6 (SIX) HOURS AS NEEDED INTO THE LUNGS FOR WHEEZING OR SHORTNESS OF BREATH. What changed:    See the new instructions.  Another medication with the same name was removed. Continue taking this medication, and follow the directions you see here.   VITAMIN C PO Take 1 tablet by mouth daily.      Allergies  Allergen Reactions  . Naproxen Hives and Swelling      The results of significant diagnostics from this hospitalization (including imaging, microbiology, ancillary and laboratory) are listed below for reference.    Significant Diagnostic Studies: Mr Cervical Spine Wo Contrast  Result Date: 11/14/2018 CLINICAL DATA:  Neck and upper back pain. EXAM: MRI CERVICAL SPINE WITHOUT CONTRAST TECHNIQUE: Multiplanar, multisequence MR imaging of  the cervical spine was performed. No intravenous contrast was administered. COMPARISON:  Radiographs 10/21/2018 FINDINGS: Alignment: Normal. Vertebrae: Normal marrow signal.  No bone lesions or fractures. Cord: Normal cord signal intensity.  No cord lesions or syrinx. Posterior Fossa, vertebral arteries, paraspinal tissues: Mild cerebellar atrophy suggested. Disc levels: C2-3: No significant findings. C3-4: No significant findings. C4-5: No significant findings. C5-6: Shallow broad-based disc protrusion slightly asymmetric to the right with mild flattening of the ventral thecal sac and slightly narrowing of the ventral CSF space. There is also mild osteophytic ridging and uncinate spurring with mild foraminal narrowing bilaterally. C6-7: Shallow central and slightly right  paracentral disc protrusion with mild mass effect on the ventral thecal sac and narrowing of the ventral CSF space. There is also mild osteophytic ridging and uncinate spurring with mild foraminal encroachment. C7-T1: No significant findings. IMPRESSION: 1. Degenerative disc disease at C5-6 and C6-7 with shallow broad-based disc protrusions, osteophytic ridging and uncinate spurring. Mild mass effect on the thecal sac and mild foraminal narrowing bilaterally as detailed above. 2. No acute bony findings and normal appearance of the cervical spinal cord. Electronically Signed   By: Marijo Sanes M.D.   On: 11/14/2018 10:49   Mr Lumbar Spine Wo Contrast  Result Date: 11/14/2018 CLINICAL DATA:  Chronic low back pain and left lower extremity radiculopathy. EXAM: MRI LUMBAR SPINE WITHOUT CONTRAST TECHNIQUE: Multiplanar, multisequence MR imaging of the lumbar spine was performed. No intravenous contrast was administered. COMPARISON:  CT scan 12/09/2017. FINDINGS: Segmentation: There are five lumbar type vertebral bodies. The last full intervertebral disc space is labeled L5-S1. Alignment:  Normal Vertebrae: Normal marrow signal except for a few small scattered hemangiomas and mild endplate reactive changes. Conus medullaris and cauda equina: Conus extends to the T12-L1 level. Conus and cauda equina appear normal. Paraspinal and other soft tissues: No significant paraspinal or retroperitoneal findings. Disc levels: L1-2: No disc protrusions or foraminal stenosis. Mild epidural lipomatosis pushing the thecal sac anteriorly. L2-3: No focal disc protrusions or foraminal stenosis. Moderate epidural lipomatosis pushing the thecal sac anteriorly. L3-4: Slight bulging annulus and osteophytic ridging but no focal disc protrusion. There is significant epidural lipomatosis compressing the thecal sac anteriorly. No foraminal stenosis. L4-5: Bulging annulus and mild osteophytic ridging along with a shallow right foraminal disc  osteophyte complex. There is mild right foraminal encroachment possibly irritating the right L4 nerve root. Significant epidural lipomatosis compressing the thecal sac. L5-S1: Shallow broad-based disc protrusion with slight extrusion of disc material down behind the S1 vertebral body. No direct neural compression but there is significant epidural lipomatosis compressing the thecal sac. Mild foraminal encroachment bilaterally. IMPRESSION: 1. Significant multilevel epidural lipomatosis with compression of the thecal sac. 2. Bulging disc at L4-5 along with osteophytic ridging. There is also a shallow right foraminal disc osteophyte complex and mild right foraminal narrowing with potential irritation of the right L4 nerve root. 3. Small central disc extrusion at L5-S1 but no direct neural compression. Electronically Signed   By: Marijo Sanes M.D.   On: 11/14/2018 10:59    Microbiology: Recent Results (from the past 240 hour(s))  MRSA PCR Screening     Status: None   Collection Time: 11/26/18  9:13 PM  Result Value Ref Range Status   MRSA by PCR NEGATIVE NEGATIVE Final    Comment:        The GeneXpert MRSA Assay (FDA approved for NASAL specimens only), is one component of a comprehensive MRSA colonization surveillance program. It is not intended  to diagnose MRSA infection nor to guide or monitor treatment for MRSA infections. Performed at [facility], [place] [address], [place], [place] [address]      Labs: Basic Metabolic Panel: Recent Labs  Lab 11/24/18 1519 11/25/18 0652 11/26/18 0623 11/27/18 0306  NA 141 138 137 136  K 5.1 4.3 4.0 3.6  CL 107 105 105 108  CO2 24 24 20* 21*  GLUCOSE 151* 126* 113* 101*  BUN 20 24* 19 13  CREATININE 0.68 0.89 0.78 0.81  CALCIUM 8.4* 7.8* 7.8* 7.5*  PHOS  --   --   --  2.4*   Liver Function Tests: Recent Labs  Lab 11/24/18 1519 11/25/18 0652 11/26/18 0623 11/27/18 0306  AST 109* 93* 116*  --   ALT 39 35 38  --    ALKPHOS 69 59 55  --   BILITOT 1.2 1.5* 1.3*  --   PROT 7.1 6.2* 6.2*  --   ALBUMIN 3.0* 2.9* 2.9* 2.8*   Recent Labs  Lab 11/24/18 1519  LIPASE 83*   Recent Labs  Lab 11/26/18 0623  AMMONIA 84*   CBC: Recent Labs  Lab 11/24/18 1519 11/25/18 0652  11/25/18 1745 11/26/18 0019 11/26/18 0623 11/27/18 0306 11/28/18 0532  WBC 10.0 11.8*   < > 9.8 10.3 10.1  9.9 11.6* 11.6*  NEUTROABS 6.9 7.6  --   --   --   --  8.0* 7.7  HGB 6.8* 7.4*   < > 7.6* 7.4* 7.4*  7.4* 8.8* 8.5*  HCT 23.5* 24.2*   < > 24.7* 24.7* 24.8*  24.5* 27.8* 28.7*  MCV 82.7 81.8   < > 83.4 84.3 86.1  84.5 83.2 85.7  PLT 134* 93*   < > 91* 93* 87*  93* 81* 104*   < > = values in this interval not displayed.   Cardiac Enzymes: No results for input(s): CKTOTAL, CKMB, CKMBINDEX, TROPONINI in the last 168 hours. BNP: BNP (last 3 results) No results for input(s): BNP in the last 8760 hours.  ProBNP (last 3 results) No results for input(s): PROBNP in the last 8760 hours.  CBG: No results for input(s): GLUCAP in the last 168 hours.     Signed:  Nita Sells MD   [facility] 11/28/2018, 8:53 AM

## 2018-11-28 NOTE — Progress Notes (Signed)
This shift  No bleeding issues. CIWA, 0. Will continue to monitor.

## 2018-11-28 NOTE — Care Management Note (Signed)
Case Management Note  Patient Details  Name: Levi Alvarez MRN: [id number] Date of Birth: 1971-09-03  Subjective/Objective:                  discharged  Action/Plan: Discharged to home with self care encouraged to stop drinking/csw for resources/ Discharged to home with self-care Orders checked for hhc needs. No CM needs present at time of discharge. Expected Discharge Date:  11/28/18               Expected Discharge Plan:  Home/Self Care  In-House Referral:  Clinical Social Work  Discharge planning Services  CM Consult  Post Acute Care Choice:    Choice offered to:     DME Arranged:    DME Agency:     HH Arranged:    HH Agency:     Status of Service:  Completed, signed off  If discussed at [employer], dates discussed:    Additional Comments:  Leeroy Cha, RN 11/28/2018, 10:24 AM

## 2018-12-01 NOTE — Telephone Encounter (Signed)
He will keep the current scheduled time.  He understands to call back, but understands the procedure would have to be moved to March or April for a later time.

## 2018-12-01 NOTE — Telephone Encounter (Signed)
Patient friend [name] states that this day will not work with her schedule and to please call her back to reschedule instead of pt. Best [phone]

## 2018-12-01 NOTE — Telephone Encounter (Signed)
This needs to go to [facility]- she is covering for Dr Havery Moros this week.

## 2018-12-01 NOTE — Telephone Encounter (Signed)
Pt sched for EGD 2.4.20 7:30 AM.  Pt's wife called to request a later appt in the day.  She has to " drop off her kids at school."

## 2018-12-01 NOTE — Telephone Encounter (Signed)
Pt sched for EGD 2.4.20 7:30 AM.  Pt's wife called to request a later appt in the day.  She has to "drop off her kids at school."

## 2018-12-01 NOTE — Telephone Encounter (Signed)
I explained that at this time there are no later procedure opening for 12/16/18.  He is advised that his ride does not need to remain at the hospital all of the time, that he could be dropped off then come back and pick him up.

## 2018-12-01 NOTE — Telephone Encounter (Signed)
I explained to Levi Alvarez that I do not have a March or April time at this point.  She is going to leave the appt as is.

## 2018-12-02 ENCOUNTER — Encounter (INDEPENDENT_AMBULATORY_CARE_PROVIDER_SITE_OTHER): Payer: Self-pay | Admitting: Orthopaedic Surgery

## 2018-12-02 ENCOUNTER — Ambulatory Visit (INDEPENDENT_AMBULATORY_CARE_PROVIDER_SITE_OTHER): Payer: Self-pay | Admitting: Orthopaedic Surgery

## 2018-12-02 ENCOUNTER — Ambulatory Visit: Payer: Self-pay | Attending: Nurse Practitioner | Admitting: Nurse Practitioner

## 2018-12-02 ENCOUNTER — Encounter: Payer: Self-pay | Admitting: Nurse Practitioner

## 2018-12-02 VITALS — BP 142/86 | HR 73 | Temp 98.4°F | Ht 70.0 in | Wt 253.8 lb

## 2018-12-02 VITALS — BP 139/83 | HR 62 | Ht 70.0 in | Wt 254.0 lb

## 2018-12-02 DIAGNOSIS — E8809 Other disorders of plasma-protein metabolism, not elsewhere classified: Secondary | ICD-10-CM

## 2018-12-02 DIAGNOSIS — Z8379 Family history of other diseases of the digestive system: Secondary | ICD-10-CM | POA: Insufficient documentation

## 2018-12-02 DIAGNOSIS — E882 Lipomatosis, not elsewhere classified: Secondary | ICD-10-CM

## 2018-12-02 DIAGNOSIS — M1732 Unilateral post-traumatic osteoarthritis, left knee: Secondary | ICD-10-CM

## 2018-12-02 DIAGNOSIS — Z79899 Other long term (current) drug therapy: Secondary | ICD-10-CM | POA: Insufficient documentation

## 2018-12-02 DIAGNOSIS — D1779 Benign lipomatous neoplasm of other sites: Secondary | ICD-10-CM

## 2018-12-02 DIAGNOSIS — K219 Gastro-esophageal reflux disease without esophagitis: Secondary | ICD-10-CM | POA: Insufficient documentation

## 2018-12-02 DIAGNOSIS — F1010 Alcohol abuse, uncomplicated: Secondary | ICD-10-CM | POA: Insufficient documentation

## 2018-12-02 DIAGNOSIS — J449 Chronic obstructive pulmonary disease, unspecified: Secondary | ICD-10-CM | POA: Insufficient documentation

## 2018-12-02 DIAGNOSIS — I10 Essential (primary) hypertension: Secondary | ICD-10-CM | POA: Insufficient documentation

## 2018-12-02 DIAGNOSIS — Z9884 Bariatric surgery status: Secondary | ICD-10-CM | POA: Insufficient documentation

## 2018-12-02 DIAGNOSIS — Z886 Allergy status to analgesic agent status: Secondary | ICD-10-CM | POA: Insufficient documentation

## 2018-12-02 DIAGNOSIS — M47812 Spondylosis without myelopathy or radiculopathy, cervical region: Secondary | ICD-10-CM

## 2018-12-02 MED ORDER — ALBUTEROL SULFATE (2.5 MG/3ML) 0.083% IN NEBU: 2.5000 mg | INHALATION_SOLUTION | Freq: Four times a day (QID) | RESPIRATORY_TRACT | 99 refills | Status: DC | PRN

## 2018-12-02 MED ORDER — CHLORDIAZEPOXIDE HCL 5 MG PO CAPS: 5.0000 mg | ORAL_CAPSULE | Freq: Two times a day (BID) | ORAL | 0 refills | Status: AC

## 2018-12-02 MED ORDER — PANTOPRAZOLE SODIUM 40 MG PO TBEC: 40.0000 mg | DELAYED_RELEASE_TABLET | Freq: Two times a day (BID) | ORAL | 2 refills | Status: DC

## 2018-12-02 NOTE — Progress Notes (Signed)
Office Visit Note   Patient: Levi Alvarez           Date of Birth: [birth year]           MRN: [id number] Visit Date: 12/02/2018              Requested by: Levi Pounds, NP [place], [place] [address] PCP: Levi Pounds, NP   Assessment & Plan: Visit Diagnoses:  1. Spondylosis without myelopathy or radiculopathy, cervical region   2. Epidural lipomatosis   3. Hypoalbuminemia   4. Post-traumatic osteoarthritis of left knee     Plan: Patient has no levels of severe compression.  He does have cervical spondylosis at 2 levels with narrowing.  Left knee osteoarthritis posttraumatic from previous ligamentous reconstruction procedure years ago.  He needs to work on improved nutrition and avoid alcohol consumption.  We discussed not substituting alcohol addiction problems with narcotic medication.  He can return in 1 month for recheck.  Follow-Up Instructions: Return in about 1 month (around 01/02/2019).   Orders:  No orders of the defined types were placed in this encounter.  No orders of the defined types were placed in this encounter.     Procedures: No procedures performed   Clinical Data: No additional findings.   Subjective: Chief Complaint  Patient presents with  . MRI Review    C and L spine    HPI 48 year old male returns with ongoing problems with low back pain and neck pain.  Recent admission to the hospital for GI bleeding related to alcoholic gastritis with elevated liver enzymes, history of chronic hepatitis C, cirrhosis, alcohol abuse.  He cannot take anti-inflammatories nor Tylenol due to the above problems.  Continues to have back pain and neck pain.  MRI scan cervical spine and lumbar spine has been obtained and available for review.  Past history of multiple upper extremity fractures left upper extremity with plates and screws.  Pain in his legs with standing and walking.  Review of Systems positive for hypertension sleep apnea obesity,  hepatitis C, chronic, cervical spondylosis, snoring, sleep apnea, poor nutrition, alcohol abuse, cirrhosis, COPD, upper GI bleed requiring admission and transfusion otherwise negative is obtains HPI.  History of left knee ACL PCL MCL reconstruction by Dr. Lorre Alvarez.   Objective: Vital Signs: BP 139/83   Pulse 62   Ht 5\' 10"  (1.778 m)   Wt 254 lb (115.2 kg)   BMI 36.45 kg/m   Physical Exam Constitutional:      Appearance: He is well-developed.  HENT:     Head: Normocephalic and atraumatic.  Eyes:     Pupils: Pupils are equal, round, and reactive to light.  Neck:     Thyroid: No thyromegaly.     Trachea: No tracheal deviation.  Cardiovascular:     Rate and Rhythm: Normal rate.  Pulmonary:     Effort: Pulmonary effort is normal.     Breath sounds: No wheezing.  Abdominal:     General: Bowel sounds are normal.     Palpations: Abdomen is soft.  Skin:    General: Skin is warm and dry.     Capillary Refill: Capillary refill takes less than 2 seconds.  Neurological:     Mental Status: He is alert and oriented to person, place, and time.  Psychiatric:        Behavior: Behavior normal.        Thought Content: Thought content normal.  Judgment: Judgment normal.     Ortho Exam patient has discomfort with cervical rotation.  No rash over exposed skin multiple tattoos.  Reflexes are 2+ normal heel toe gait.  Left knee crepitus with multiple scars from ligamentous reconstruction procedures.  Specialty Comments:  No specialty comments available.  Imaging: CLINICAL DATA:  Chronic low back pain and left lower extremity radiculopathy.  EXAM: MRI LUMBAR SPINE WITHOUT CONTRAST  TECHNIQUE: Multiplanar, multisequence MR imaging of the lumbar spine was performed. No intravenous contrast was administered.  COMPARISON:  CT scan 12/09/2017.  FINDINGS: Segmentation: There are five lumbar type vertebral bodies. The last full intervertebral disc space is labeled  L5-S1.  Alignment:  Normal  Vertebrae: Normal marrow signal except for a few small scattered hemangiomas and mild endplate reactive changes.  Conus medullaris and cauda equina: Conus extends to the T12-L1 level. Conus and cauda equina appear normal.  Paraspinal and other soft tissues: No significant paraspinal or retroperitoneal findings.  Disc levels:  L1-2: No disc protrusions or foraminal stenosis. Mild epidural lipomatosis pushing the thecal sac anteriorly.  L2-3: No focal disc protrusions or foraminal stenosis. Moderate epidural lipomatosis pushing the thecal sac anteriorly.  L3-4: Slight bulging annulus and osteophytic ridging but no focal disc protrusion. There is significant epidural lipomatosis compressing the thecal sac anteriorly. No foraminal stenosis.  L4-5: Bulging annulus and mild osteophytic ridging along with a shallow right foraminal disc osteophyte complex. There is mild right foraminal encroachment possibly irritating the right L4 nerve root. Significant epidural lipomatosis compressing the thecal sac.  L5-S1: Shallow broad-based disc protrusion with slight extrusion of disc material down behind the S1 vertebral body. No direct neural compression but there is significant epidural lipomatosis compressing the thecal sac. Mild foraminal encroachment bilaterally.  IMPRESSION: 1. Significant multilevel epidural lipomatosis with compression of the thecal sac. 2. Bulging disc at L4-5 along with osteophytic ridging. There is also a shallow right foraminal disc osteophyte complex and mild right foraminal narrowing with potential irritation of the right L4 nerve root. 3. Small central disc extrusion at L5-S1 but no direct neural compression.   Electronically Signed   By: Levi Alvarez M.D.   On: 11/14/2018 10:59 CLINICAL DATA:  Neck and upper back pain.  EXAM: MRI CERVICAL SPINE WITHOUT CONTRAST  TECHNIQUE: Multiplanar, multisequence MR  imaging of the cervical spine was performed. No intravenous contrast was administered.  COMPARISON:  Radiographs 10/21/2018  FINDINGS: Alignment: Normal.  Vertebrae: Normal marrow signal.  No bone lesions or fractures.  Cord: Normal cord signal intensity.  No cord lesions or syrinx.  Posterior Fossa, vertebral arteries, paraspinal tissues: Mild cerebellar atrophy suggested.  Disc levels:  C2-3: No significant findings.  C3-4: No significant findings.  C4-5: No significant findings.  C5-6: Shallow broad-based disc protrusion slightly asymmetric to the right with mild flattening of the ventral thecal sac and slightly narrowing of the ventral CSF space. There is also mild osteophytic ridging and uncinate spurring with mild foraminal narrowing bilaterally.  C6-7: Shallow central and slightly right paracentral disc protrusion with mild mass effect on the ventral thecal sac and narrowing of the ventral CSF space. There is also mild osteophytic ridging and uncinate spurring with mild foraminal encroachment.  C7-T1: No significant findings.  IMPRESSION: 1. Degenerative disc disease at C5-6 and C6-7 with shallow broad-based disc protrusions, osteophytic ridging and uncinate spurring. Mild mass effect on the thecal sac and mild foraminal narrowing bilaterally as detailed above. 2. No acute bony findings and normal appearance of the cervical spinal  cord.   Electronically Signed   By: Levi Alvarez M.D.   On: 11/14/2018 10:49  PMFS History: Patient Active Problem List   Diagnosis Date Noted  . Spondylosis without myelopathy or radiculopathy, cervical region 12/03/2018  . 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  . Post-traumatic osteoarthritis of left knee 12/03/2018  . Chronic obstructive pulmonary disease ([place]) 12/02/2018  . Upper GI bleed 11/24/2018  . Acute blood loss anemia 11/24/2018  . Alcohol abuse 11/24/2018  . Chronic back pain 11/24/2018  . Chronic  hepatitis C without hepatic coma ([place]) 10/08/2017  . Cirrhosis ([place]) 10/08/2017  . Chronic pain of left knee 07/23/2017  . Essential hypertension 07/12/2017  . OSA (obstructive sleep apnea) 07/12/2017  . Class 2 obesity with serious comorbidity and body mass index (BMI) of 35.0 to 35.9 in adult 07/12/2017  . Elevated liver enzymes 07/12/2017  . Other chronic pain 07/12/2017  . Smoker 07/12/2017  . Hyperlipidemia 07/12/2017  . Polyarthralgia 07/12/2017  . Snoring 06/05/2017   Past Medical History:  Diagnosis Date  . Hepatitis C antibody positive in blood   . High blood pressure     Family History  Problem Relation Age of Onset  . Colitis Mother   . Arthritis Mother   . Cancer Father        lymphoma?  . Cancer Paternal Grandfather        bone  . Heart disease Neg Hx   . Stroke Neg Hx   . Diabetes Neg Hx     Past Surgical History:  Procedure Laterality Date  . arm surgery    . ESOPHAGEAL BANDING  11/25/2018   Procedure: ESOPHAGEAL BANDING;  Surgeon: Milus Banister, MD;  Location: [facility];  Service: Endoscopy;;  . ESOPHAGOGASTRODUODENOSCOPY (EGD) WITH PROPOFOL N/A 11/25/2018   Procedure: ESOPHAGOGASTRODUODENOSCOPY (EGD) WITH PROPOFOL;  Surgeon: Milus Banister, MD;  Location: [facility];  Service: Endoscopy;  Laterality: N/A;  . KNEE SURGERY     Social History   Occupational History  . Not on file  Tobacco Use  . Smoking status: Current Every Day Smoker    Packs/day: 1.00    Types: Cigarettes  . Smokeless tobacco: Former Systems developer    Types: Chew  Substance and Sexual Activity  . Alcohol use: Yes    Alcohol/week: 18.0 standard drinks    Types: 18 Cans of beer per week    Frequency: Never    Comment: daily  . Drug use: No  . Sexual activity: Yes    Partners: Female

## 2018-12-02 NOTE — Progress Notes (Signed)
Assessment & Plan:  Levi Alvarez was seen today for hypertension and back pain.  Diagnoses and all orders for this visit:  Essential hypertension Continue all antihypertensives as prescribed.  Remember to bring in your blood pressure log with you for your follow up appointment.  DASH/Mediterranean Diets are healthier choices for HTN.    Chronic obstructive pulmonary disease, unspecified COPD type (HCC) -     albuterol (PROVENTIL) (2.5 MG/3ML) 0.083% nebulizer solution; Take 3 mLs (2.5 mg total) by nebulization every 6 (six) hours as needed for wheezing or shortness of breath. Chronic and stable.   Gastroesophageal reflux disease without esophagitis -     pantoprazole (PROTONIX) 40 MG tablet; Take 1 tablet (40 mg total) by mouth 2 (two) times daily before a meal for 30 days. INSTRUCTIONS: Avoid GERD Triggers: acidic, spicy or fried foods, caffeine, coffee, sodas,  alcohol and chocolate.    Alcohol abuse -     chlordiazePOXIDE ([name]) 5 MG capsule; Take 1 capsule (5 mg total) by mouth 2 (two) times daily for 14 days. Refrain from all alcohol use.    Patient has been counseled on age-appropriate routine health concerns for screening and prevention. These are reviewed and up-to-date. Referrals have been placed accordingly. Immunizations are up-to-date or declined.    Subjective:   Chief Complaint  Patient presents with  . Hypertension  . Back Pain   [name] 48 y.o. male presents to office today for follow up to HTN and with complaints of tremors and "shakiness" from abstaining from ETOH( he was drinking up to 18 beers a day right until his hospital admission). He was admitted to the hospital 11-24-2018 for UGI bleed due to ETOH abuse and large esophageal variceal bleed. He had 6 variceal ligating bands placed by GI and was instructed to continue PPI BID. He is scheduled for f/u endoscopy as OP 12-16-2018. He currently denies any hematemesis or abdominal pain. He was given a  benzodiazepine in the hospital however he was not discharged home with a prescription. Will start him on a short dose of librium to be dc'd in 2 weeks.  Essential Hypertension Blood pressure well controlled on coreg 3.125 mg BID and spirinolactone 50 mg daily. He endorses medication compliance and denies chest pain, shortness of breath, palpitations, lightheadedness, dizziness, headaches or BLE edema.   BP Readings from Last 3 Encounters:  12/02/18 139/83  12/02/18 (!) 142/86  11/28/18 135/82     Review of Systems  Constitutional: Negative for fever, malaise/fatigue and weight loss.  HENT: Negative.  Negative for nosebleeds.   Eyes: Negative.  Negative for blurred vision, double vision and photophobia.  Respiratory: Positive for shortness of breath (chronic). Negative for cough.   Cardiovascular: Negative.  Negative for chest pain, palpitations and leg swelling.  Gastrointestinal: Positive for heartburn. Negative for nausea and vomiting.  Musculoskeletal: Positive for back pain. Negative for myalgias.  Neurological: Positive for tremors. Negative for dizziness, focal weakness, seizures and headaches.  Psychiatric/Behavioral: Positive for substance abuse. Negative for suicidal ideas.    Past Medical History:  Diagnosis Date  . Hepatitis C antibody positive in blood   . High blood pressure     Past Surgical History:  Procedure Laterality Date  . arm surgery    . ESOPHAGEAL BANDING  11/25/2018   Procedure: ESOPHAGEAL BANDING;  Surgeon: Milus Banister, MD;  Location: [facility];  Service: Endoscopy;;  . ESOPHAGOGASTRODUODENOSCOPY (EGD) WITH PROPOFOL N/A 11/25/2018   Procedure: ESOPHAGOGASTRODUODENOSCOPY (EGD) WITH PROPOFOL;  Surgeon:  Milus Banister, MD;  Location: [facility];  Service: Endoscopy;  Laterality: N/A;  . KNEE SURGERY      Family History  Problem Relation Age of Onset  . Colitis Mother   . Arthritis Mother   . Cancer Father        lymphoma?  . Cancer Paternal  Grandfather        bone  . Heart disease Neg Hx   . Stroke Neg Hx   . Diabetes Neg Hx     Social History Reviewed with no changes to be made today.   Outpatient Medications Prior to Visit  Medication Sig Dispense Refill  . alum & mag hydroxide-simeth (MAALOX/MYLANTA) 200-200-20 MG/5ML suspension Take 30 mLs by mouth every 4 (four) hours as needed for indigestion or heartburn. 355 mL 0  . Ascorbic Acid (VITAMIN C PO) Take 1 tablet by mouth daily.    Marland Kitchen BREO ELLIPTA 200-25 MCG/INH AEPB INHALE 1 PUFF DAILY INTO THE LUNGS. (Patient taking differently: Inhale 1 puff into the lungs. ) 60 each 2  . loratadine (CLARITIN) 10 MG tablet Take 10 mg by mouth daily.    Marland Kitchen MILK THISTLE PO Take 1 tablet by mouth daily.    . Multiple Vitamin (MULTI-VITAMINS) TABS Take 1 tablet by mouth 1 day or 1 dose.    . spironolactone (ALDACTONE) 50 MG tablet Take 1 tablet (50 mg total) by mouth daily. 30 tablet 2  . tadalafil, PAH, (ADCIRCA) 20 MG tablet TAKE 1/2-1 TABLET BY MOUTH EVERY OTHER DAY AS NEEDED FOR ERECTILE DYSFUNCTION. (Patient taking differently: Take 10-20 mg by mouth every other day. PRN for erectile dysfunction) 10 tablet 1  . VENTOLIN HFA 108 (90 Base) MCG/ACT inhaler INHALE 1 PUFF EVERY 6 (SIX) HOURS AS NEEDED INTO THE LUNGS FOR WHEEZING OR SHORTNESS OF BREATH. (Patient taking differently: Inhale 2 puffs into the lungs every 6 (six) hours as needed for wheezing or shortness of breath. ) 18 g 1  . pantoprazole (PROTONIX) 40 MG tablet Take 1 tablet (40 mg total) by mouth daily. 30 tablet 2  . carvedilol (COREG) 3.125 MG tablet Take 1 tablet (3.125 mg total) by mouth 2 (two) times daily. 60 tablet 1  . lidocaine (LIDODERM) 5 % Place 1 patch onto the skin daily. Remove & Discard patch within 12 hours or as directed by MD (Patient not taking: Reported on 11/24/2018) 30 patch 1   Facility-Administered Medications Prior to Visit  Medication Dose Route Frequency Provider Last Rate Last Dose  . 0.9 %  sodium  chloride infusion  500 mL Intravenous Continuous Armbruster, Carlota Raspberry, MD        Allergies  Allergen Reactions  . Naproxen Hives and Swelling       Objective:    BP (!) 142/86   Pulse 73   Temp 98.4 F (36.9 C) (Oral)   Ht 5\' 10"  (1.778 m)   Wt 253 lb 12.8 oz (115.1 kg)   SpO2 99%   BMI 36.42 kg/m  Wt Readings from Last 3 Encounters:  12/02/18 254 lb (115.2 kg)  12/02/18 253 lb 12.8 oz (115.1 kg)  11/25/18 241 lb 2.9 oz (109.4 kg)    Physical Exam Vitals signs and nursing note reviewed.  Constitutional:      Appearance: He is well-developed.  HENT:     Head: Normocephalic and atraumatic.  Neck:     Musculoskeletal: Normal range of motion.  Cardiovascular:     Rate and Rhythm: Normal rate and regular rhythm.  Heart sounds: Normal heart sounds. No murmur. No friction rub. No gallop.   Pulmonary:     Effort: Pulmonary effort is normal. No tachypnea or respiratory distress.     Breath sounds: Normal breath sounds. No decreased breath sounds, wheezing, rhonchi or rales.  Chest:     Chest wall: No tenderness.  Abdominal:     General: Bowel sounds are normal.     Palpations: Abdomen is soft.  Musculoskeletal: Normal range of motion.  Skin:    General: Skin is warm and dry.  Neurological:     Mental Status: He is alert and oriented to person, place, and time.     Coordination: Coordination normal.  Psychiatric:        Behavior: Behavior normal. Behavior is cooperative.        Thought Content: Thought content normal.        Judgment: Judgment normal.          Patient has been counseled extensively about nutrition and exercise as well as the importance of adherence with medications and regular follow-up. The patient was given clear instructions to go to ER or return to medical center if symptoms don't improve, worsen or new problems develop. The patient verbalized understanding.   Follow-up: Return in about 3 months (around 03/03/2019) for make lab appointment  in 2 weeks and see me in 3 months.   Gildardo Pounds, FNP-BC [facility] [place], [place]   12/03/2018, 10:24 PM

## 2018-12-03 ENCOUNTER — Encounter: Payer: Self-pay | Admitting: Nurse Practitioner

## 2018-12-03 DIAGNOSIS — D1779 Benign lipomatous neoplasm of other sites: Secondary | ICD-10-CM | POA: Insufficient documentation

## 2018-12-03 DIAGNOSIS — M1732 Unilateral post-traumatic osteoarthritis, left knee: Secondary | ICD-10-CM | POA: Insufficient documentation

## 2018-12-03 DIAGNOSIS — M47812 Spondylosis without myelopathy or radiculopathy, cervical region: Secondary | ICD-10-CM | POA: Insufficient documentation

## 2018-12-03 MED FILL — ALBUTEROL SUL 2.5 MG/3 ML S: (2.5 MG/3ML | 12 days supply | Qty: 150 | Fill #0

## 2018-12-09 ENCOUNTER — Other Ambulatory Visit: Payer: Self-pay | Admitting: Nurse Practitioner

## 2018-12-09 DIAGNOSIS — F17218 Nicotine dependence, cigarettes, with other nicotine-induced disorders: Secondary | ICD-10-CM

## 2018-12-09 MED FILL — !VENTOLIN HFA INHALER: 108 (90 BAS | 25 days supply | Qty: 18 | Fill #0

## 2018-12-11 ENCOUNTER — Encounter (HOSPITAL_COMMUNITY): Payer: Self-pay | Admitting: Emergency Medicine

## 2018-12-11 ENCOUNTER — Emergency Department (HOSPITAL_COMMUNITY): Payer: Self-pay

## 2018-12-11 ENCOUNTER — Emergency Department (HOSPITAL_COMMUNITY)
Admission: EM | Admit: 2018-12-11 | Discharge: 2018-12-11 | Disposition: A | Discharge status: Home / Self Care | Payer: Self-pay | Attending: Emergency Medicine | Admitting: Emergency Medicine

## 2018-12-11 DIAGNOSIS — K529 Noninfective gastroenteritis and colitis, unspecified: Secondary | ICD-10-CM | POA: Insufficient documentation

## 2018-12-11 DIAGNOSIS — Z79899 Other long term (current) drug therapy: Secondary | ICD-10-CM | POA: Insufficient documentation

## 2018-12-11 DIAGNOSIS — R1033 Periumbilical pain: Secondary | ICD-10-CM

## 2018-12-11 DIAGNOSIS — F17210 Nicotine dependence, cigarettes, uncomplicated: Secondary | ICD-10-CM | POA: Insufficient documentation

## 2018-12-11 DIAGNOSIS — I10 Essential (primary) hypertension: Secondary | ICD-10-CM | POA: Insufficient documentation

## 2018-12-11 LAB — COMPREHENSIVE METABOLIC PANEL
ALBUMIN: 3.1 g/dL — AB (ref 3.5–5.0)
ALT: 43 U/L (ref 0–44)
ANION GAP: 5 (ref 5–15)
AST: 78 U/L — ABNORMAL HIGH (ref 15–41)
Alkaline Phosphatase: 83 U/L (ref 38–126)
BUN: 6 mg/dL (ref 6–20)
CO2: 25 mmol/L (ref 22–32)
Calcium: 9.1 mg/dL (ref 8.9–10.3)
Chloride: 105 mmol/L (ref 98–111)
Creatinine, Ser: 0.8 mg/dL (ref 0.61–1.24)
GFR calc Af Amer: >60 mL/min (ref >60)
GFR calc non Af Amer: >60 mL/min (ref >60)
GLUCOSE: 117 mg/dL — AB (ref 70–99)
Potassium: 4.3 mmol/L (ref 3.5–5.1)
SODIUM: 135 mmol/L (ref 135–145)
TOTAL PROTEIN: 7.5 g/dL (ref 6.5–8.1)
Total Bilirubin: 1.1 mg/dL (ref 0.3–1.2)

## 2018-12-11 LAB — AMMONIA: Ammonia: 23 umol/L (ref 9–35)

## 2018-12-11 LAB — CBC WITH DIFFERENTIAL/PLATELET
Abs Immature Granulocytes: 0.05 10*3/uL (ref 0.00–0.07)
BASOS ABS: 0.1 10*3/uL (ref 0.0–0.1)
Basophils Relative: 1 %
EOS PCT: 2 %
Eosinophils Absolute: 0.2 10*3/uL (ref 0.0–0.5)
HCT: 30.8 % — ABNORMAL LOW (ref 39.0–52.0)
Hemoglobin: 9.1 g/dL — ABNORMAL LOW (ref 13.0–17.0)
Immature Granulocytes: 1 %
LYMPHS PCT: 17 %
Lymphs Abs: 1.7 10*3/uL (ref 0.7–4.0)
MCH: 24.2 pg — ABNORMAL LOW (ref 26.0–34.0)
MCHC: 29.5 g/dL — ABNORMAL LOW (ref 30.0–36.0)
MCV: 81.9 fL (ref 80.0–100.0)
Monocytes Absolute: 1.2 10*3/uL — ABNORMAL HIGH (ref 0.1–1.0)
Monocytes Relative: 11 %
Neutro Abs: 7.2 10*3/uL (ref 1.7–7.7)
Neutrophils Relative %: 68 %
Platelets: 292 10*3/uL (ref 150–400)
RBC: 3.76 MIL/uL — ABNORMAL LOW (ref 4.22–5.81)
RDW: 19.9 % — ABNORMAL HIGH (ref 11.5–15.5)
WBC: 10.4 10*3/uL (ref 4.0–10.5)
nRBC: 0 % (ref 0.0–0.2)

## 2018-12-11 LAB — URINALYSIS, ROUTINE W REFLEX MICROSCOPIC
Bilirubin Urine: NEGATIVE
Glucose, UA: NEGATIVE mg/dL
HGB URINE DIPSTICK: NEGATIVE
Ketones, ur: NEGATIVE mg/dL
Leukocytes, UA: NEGATIVE
Nitrite: NEGATIVE
Protein, ur: NEGATIVE mg/dL
Specific Gravity, Urine: 1.033 — ABNORMAL HIGH (ref 1.005–1.030)
pH: 6 (ref 5.0–8.0)

## 2018-12-11 LAB — LIPASE, BLOOD: Lipase: 52 U/L — ABNORMAL HIGH (ref 11–51)

## 2018-12-11 MED ORDER — ONDANSETRON HCL 4 MG/2ML IJ SOLN
4.0000 mg | Freq: Once | INTRAMUSCULAR | Status: AC
  Administered 2018-12-11: 4 mg via INTRAVENOUS
  Filled 2018-12-11: qty 2

## 2018-12-11 MED ORDER — ONDANSETRON 4 MG PO TBDP: 4.0000 mg | ORAL_TABLET | Freq: Three times a day (TID) | ORAL | 0 refills | Status: DC | PRN

## 2018-12-11 MED ORDER — CIPROFLOXACIN HCL 500 MG PO TABS
500.0000 mg | ORAL_TABLET | Freq: Once | ORAL | Status: AC
  Administered 2018-12-11: 500 mg via ORAL
  Filled 2018-12-11: qty 1

## 2018-12-11 MED ORDER — METRONIDAZOLE 500 MG PO TABS: 500.0000 mg | ORAL_TABLET | Freq: Three times a day (TID) | ORAL | 0 refills | Status: DC

## 2018-12-11 MED ORDER — HYDROMORPHONE HCL 1 MG/ML IJ SOLN
0.5000 mg | Freq: Once | INTRAMUSCULAR | Status: AC
  Administered 2018-12-11: 0.5 mg via INTRAVENOUS
  Filled 2018-12-11: qty 1

## 2018-12-11 MED ORDER — CIPROFLOXACIN HCL 500 MG PO TABS: 500.0000 mg | ORAL_TABLET | Freq: Two times a day (BID) | ORAL | 0 refills | Status: DC

## 2018-12-11 MED ORDER — IOPAMIDOL (ISOVUE-300) INJECTION 61%
100.0000 mL | Freq: Once | INTRAVENOUS | Status: AC | PRN
  Administered 2018-12-11: 100 mL via INTRAVENOUS

## 2018-12-11 MED ORDER — METRONIDAZOLE 500 MG PO TABS
500.0000 mg | ORAL_TABLET | Freq: Once | ORAL | Status: AC
  Administered 2018-12-11: 500 mg via ORAL
  Filled 2018-12-11: qty 1

## 2018-12-11 MED FILL — !BREO ELLIPTA 200-25 MCG: 200-25 | 30 days supply | Qty: 60 | Fill #2

## 2018-12-11 MED FILL — ?SPIRONOLACTONE 50 MG TABLE: 50 | 30 days supply | Qty: 30 | Fill #2

## 2018-12-11 MED FILL — ONDANSETRON ODT 4 MG TABLET: 4 | 3 days supply | Qty: 10 | Fill #0

## 2018-12-11 MED FILL — ?PANTOPRAZOLE SO DR 40MG TA: 40 | 30 days supply | Qty: 60 | Fill #0

## 2018-12-11 MED FILL — ?CARVEDILOL 3.125 MG TABLET: 3.125 | 30 days supply | Qty: 60 | Fill #1

## 2018-12-11 MED FILL — metroNIDAZOLE 500 MG TABS: 500 | 7 days supply | Qty: 21 | Fill #0

## 2018-12-11 MED FILL — CIPROFLOXACIN HCL 500 MG TA: 500 | 7 days supply | Qty: 14 | Fill #0

## 2018-12-11 NOTE — ED Notes (Signed)
D/c reviewed with patient 

## 2018-12-11 NOTE — ED Triage Notes (Signed)
Patient arrived from home reporting that he had a procedure 2 weeks ago and he is having pain around his abdominal area

## 2018-12-11 NOTE — ED Notes (Addendum)
Pt transported to CT. Pt also notified to provide UA as soon as possible; urinal is at bedside / Pt verbalized understanding.

## 2018-12-11 NOTE — ED Provider Notes (Signed)
[facility] EMERGENCY DEPARTMENT Provider Note   CSN: [id number] Arrival date & time: 12/11/18  9379     History   Chief Complaint No chief complaint on file.   HPI Levi Alvarez is a 48 y.o. male.  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  Patient also also had distention of his abdomen.  He has been very tired and sleepy, mildly confused over the past 2 days.  Denies fever, chest pain, shortness of breath.  No vomiting or diarrhea.  No constipation or urine symptoms.  No treatments prior to arrival.  Patient is due for repeat endoscopy next week.  No history of abdominal surgeries.     Past Medical History:  Diagnosis Date  . Hepatitis C antibody positive in blood   . High blood pressure     Patient Active Problem List   Diagnosis Date Noted  . Spondylosis without myelopathy or radiculopathy, cervical region 12/03/2018  . 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  . Post-traumatic osteoarthritis of left knee 12/03/2018  . Chronic obstructive pulmonary disease ([place]) 12/02/2018  . Upper GI bleed 11/24/2018  . Acute blood loss anemia 11/24/2018  . Alcohol abuse 11/24/2018  . Chronic back pain 11/24/2018  . Chronic hepatitis C without hepatic coma ([place]) 10/08/2017  . Cirrhosis ([place]) 10/08/2017  . Chronic pain of left knee 07/23/2017  . Essential hypertension 07/12/2017  . OSA (obstructive sleep apnea) 07/12/2017  . Class 2 obesity with serious comorbidity and body mass index (BMI) of 35.0 to 35.9 in adult 07/12/2017  . Elevated liver enzymes 07/12/2017  . Other chronic pain 07/12/2017  . Smoker 07/12/2017  . Hyperlipidemia 07/12/2017  . Polyarthralgia 07/12/2017  . Snoring 06/05/2017    Past Surgical History:  Procedure Laterality Date  . arm  surgery    . ESOPHAGEAL BANDING  11/25/2018   Procedure: ESOPHAGEAL BANDING;  Surgeon: Milus Banister, MD;  Location: [facility];  Service: Endoscopy;;  . ESOPHAGOGASTRODUODENOSCOPY (EGD) WITH PROPOFOL N/A 11/25/2018   Procedure: ESOPHAGOGASTRODUODENOSCOPY (EGD) WITH PROPOFOL;  Surgeon: Milus Banister, MD;  Location: [facility];  Service: Endoscopy;  Laterality: N/A;  . KNEE SURGERY          Home Medications    Prior to Admission medications   Medication Sig Start Date End Date Taking? Authorizing Provider  albuterol (PROVENTIL) (2.5 MG/3ML) 0.083% nebulizer solution Take 3 mLs (2.5 mg total) by nebulization every 6 (six) hours as needed for wheezing or shortness of breath. 12/02/18   Gildardo Pounds, NP  alum & mag hydroxide-simeth (MAALOX/MYLANTA) 200-200-20 MG/5ML suspension Take 30 mLs by mouth every 4 (four) hours as needed for indigestion or heartburn. 11/28/18   Nita Sells, MD  Ascorbic Acid (VITAMIN C PO) Take 1 tablet by mouth daily.    [provider]  BREO ELLIPTA 200-25 MCG/INH AEPB INHALE 1 PUFF DAILY INTO THE LUNGS. Patient taking differently: Inhale 1 puff into the lungs.  09/11/18   Gildardo Pounds, NP  carvedilol (COREG) 3.125 MG tablet Take 1 tablet (3.125 mg total) by mouth 2 (two) times daily. 09/11/18 11/24/18  Gildardo Pounds, NP  chlordiazePOXIDE ([name]) 5 MG capsule Take 1 capsule (5 mg total) by mouth 2 (two) times daily for 14 days. 12/02/18 12/16/18  Gildardo Pounds, NP  lidocaine (LIDODERM) 5 % Place 1  patch onto the skin daily. Remove & Discard patch within 12 hours or as directed by MD Patient not taking: Reported on 11/24/2018 08/05/18   Gildardo Pounds, NP  loratadine (CLARITIN) 10 MG tablet Take 10 mg by mouth daily.    [provider]  MILK THISTLE PO Take 1 tablet by mouth daily.    [provider]  Multiple Vitamin (MULTI-VITAMINS) TABS Take 1 tablet by mouth 1 day or 1 dose. 04/04/15   [provider]    pantoprazole (PROTONIX) 40 MG tablet Take 1 tablet (40 mg total) by mouth 2 (two) times daily before a meal for 30 days. 12/02/18 01/01/19  Gildardo Pounds, NP  spironolactone (ALDACTONE) 50 MG tablet Take 1 tablet (50 mg total) by mouth daily. 10/17/18   Gildardo Pounds, NP  tadalafil, PAH, (ADCIRCA) 20 MG tablet TAKE 1/2-1 TABLET BY MOUTH EVERY OTHER DAY AS NEEDED FOR ERECTILE DYSFUNCTION. Patient taking differently: Take 10-20 mg by mouth every other day. PRN for erectile dysfunction 11/03/18   Gildardo Pounds, NP  VENTOLIN HFA 108 (90 Base) MCG/ACT inhaler INHALE 1 PUFF EVERY 6 (SIX) HOURS AS NEEDED INTO THE LUNGS FOR WHEEZING OR SHORTNESS OF BREATH. 12/09/18   Gildardo Pounds, NP    Family History Family History  Problem Relation Age of Onset  . Colitis Mother   . Arthritis Mother   . Cancer Father        lymphoma?  . Cancer Paternal Grandfather        bone  . Heart disease Neg Hx   . Stroke Neg Hx   . Diabetes Neg Hx     Social History Social History   Tobacco Use  . Smoking status: Current Every Day Smoker    Packs/day: 1.00    Types: Cigarettes  . Smokeless tobacco: Former Systems developer    Types: Chew  Substance Use Topics  . Alcohol use: Yes    Alcohol/week: 18.0 standard drinks    Types: 18 Cans of beer per week    Frequency: Never    Comment: daily  . Drug use: No     Allergies   Naproxen   Review of Systems Review of Systems  Constitutional: Positive for fatigue. Negative for fever.  HENT: Negative for rhinorrhea and sore throat.   Eyes: Negative for redness.  Respiratory: Negative for cough.   Cardiovascular: Negative for chest pain.  Gastrointestinal: Positive for abdominal distention and abdominal pain. Negative for diarrhea, nausea and vomiting.  Genitourinary: Negative for dysuria.  Musculoskeletal: Negative for myalgias.  Skin: Negative for rash.  Neurological: Negative for headaches.  Psychiatric/Behavioral: Positive for confusion.     Physical  Exam Updated Vital Signs BP 119/80 (BP Location: Right Arm)   Pulse 74   Temp 98.2 F (36.8 C) (Oral)   Resp 20   SpO2 100%   Physical Exam Vitals signs and nursing note reviewed.  Constitutional:      Appearance: He is well-developed.  HENT:     Head: Normocephalic and atraumatic.  Eyes:     General:        Right eye: No discharge.        Left eye: No discharge.     Conjunctiva/sclera: Conjunctivae normal.  Neck:     Musculoskeletal: Normal range of motion and neck supple.  Cardiovascular:     Rate and Rhythm: Normal rate and regular rhythm.     Heart sounds: Normal heart sounds.  Pulmonary:     Effort:  Pulmonary effort is normal.     Breath sounds: Normal breath sounds.  Abdominal:     Palpations: Abdomen is soft.     Tenderness: There is abdominal tenderness. There is no guarding or rebound.     Hernia: A hernia is present.     Comments: Moderate tenderness focused over the periumbilical area.  There is a small umbilical hernia which is readily reducible.  No overlying erythema, redness, warmth.  Skin:    General: Skin is warm and dry.  Neurological:     Mental Status: He is alert.      ED Treatments / Results  Labs (all labs ordered are listed, but only abnormal results are displayed) Labs Reviewed  CBC WITH DIFFERENTIAL/PLATELET - Abnormal; Notable for the following components:      Result Value   RBC 3.76 (*)    Hemoglobin 9.1 (*)    HCT 30.8 (*)    MCH 24.2 (*)    MCHC 29.5 (*)    RDW 19.9 (*)    Monocytes Absolute 1.2 (*)    All other components within normal limits  COMPREHENSIVE METABOLIC PANEL - Abnormal; Notable for the following components:   Glucose, Bld 117 (*)    Albumin 3.1 (*)    AST 78 (*)    All other components within normal limits  LIPASE, BLOOD - Abnormal; Notable for the following components:   Lipase 52 (*)    All other components within normal limits  URINALYSIS, ROUTINE W REFLEX MICROSCOPIC - Abnormal; Notable for the following  components:   Color, Urine AMBER (*)    Specific Gravity, Urine 1.033 (*)    All other components within normal limits  AMMONIA    EKG None  Radiology Ct Abdomen Pelvis W Contrast  Result Date: 12/11/2018 CLINICAL DATA:  Generalized periumbilical abdominal pain, tremors, hypertension, shakiness, history of cirrhosis, heavy ethanol intake, chronic hepatitis-C, COPD, smoker EXAM: CT ABDOMEN AND PELVIS WITH CONTRAST TECHNIQUE: Multidetector CT imaging of the abdomen and pelvis was performed using the standard protocol following bolus administration of intravenous contrast. Sagittal and coronal MPR images reconstructed from axial data set. CONTRAST:  148mL ISOVUE-300 IOPAMIDOL (ISOVUE-300) INJECTION 61% IV. No oral contrast. COMPARISON:  12/09/2017 FINDINGS: Lower chest: Minimal subsegmental atelectasis LEFT lower lobe Hepatobiliary: Irregular cirrhotic appearing liver without focal mass. Gallbladder unremarkable. No biliary dilatation. Pancreas: Normal appearance Spleen: Normal size and appearance Adrenals/Urinary Tract: Adrenal glands normal appearance. Kidneys, ureters, and bladder normal appearance Stomach/Bowel: Normal appendix. Questionable mild diffuse colonic wall thickening ascending through descending colon question colitis. Stomach and remaining bowel loops unremarkable. Vascular/Lymphatic: Atherosclerotic calcifications aorta without aneurysm. Scattered collaterals in the upper abdomen. Major venous structures appear grossly patent. No adenopathy. Reproductive: Unremarkable Other: Small LEFT inguinal and umbilical hernias containing fat. Scattered edema within mesentery and within retroperitoneal tissue planes extending in the pelvis. No free air. Minimal perihepatic and perisplenic ascites. Musculoskeletal: No acute osseous findings. IMPRESSION: Cirrhotic appearing liver with scattered collaterals in the upper abdomen, scattered mesenteric and retroperitoneal edema, and minimal ascites.  Questionable wall thickening of the ascending to proximal descending colon cannot exclude colitis. Small LEFT inguinal and umbilical hernias containing fat. Electronically Signed   By: Lavonia Dana M.D.   On: 12/11/2018 12:43    Procedures Procedures (including critical care time)  Medications Ordered in ED Medications  HYDROmorphone (DILAUDID) injection 0.5 mg (0.5 mg Intravenous Given 12/11/18 1013)  ondansetron (ZOFRAN) injection 4 mg (4 mg Intravenous Given 12/11/18 1013)  iopamidol (ISOVUE-300) 61 %  injection 100 mL (100 mLs Intravenous Contrast Given 12/11/18 1205)  ciprofloxacin (CIPRO) tablet 500 mg (500 mg Oral Given 12/11/18 1500)  metroNIDAZOLE (FLAGYL) tablet 500 mg (500 mg Oral Given 12/11/18 1500)     Initial Impression / Assessment and Plan / ED Course  I have reviewed the triage vital signs and the nursing notes.  Pertinent labs & imaging results that were available during my care of the patient were reviewed by me and considered in my medical decision making (see chart for details).     Patient seen and examined. Work-up initiated. Medications ordered.  Will need CT imaging of the abdomen and pelvis.  I used bedside ultrasound to assess for ascites.  No large volume ascites visualized.  Vital signs reviewed and are as follows: BP 119/80 (BP Location: Right Arm)   Pulse 74   Temp 98.2 F (36.8 C) (Oral)   Resp 20   SpO2 100%   3:09 PM reviewed CT images.  Patient discussed with Dr. Ronnald Nian.  Will treat for colitis.  Patient will be started on Cipro, Flagyl.  He has GI follow-up next week for repeat endoscopy.  I encouraged the patient and his wife to call the gastroenterology office and give them an update regarding ED visit and CT findings.  The patient was urged to return to the Emergency Department immediately with worsening of current symptoms, worsening abdominal pain, persistent vomiting, blood noted in stools, fever, or any other concerns. The patient verbalized  understanding.    Final Clinical Impressions(s) / ED Diagnoses   Final diagnoses:  Colitis  Periumbilical abdominal pain   Patient with abdominal pain.  Recent admission for upper GI bleed due to esophageal varices.  Vitals are stable, no fever. Labs with normal white blood cell count, recovering anemia. Imaging CT of the abdomen shows signs of colitis without complication.  No signs of obstruction or incarcerated hernia. No signs of dehydration, patient is tolerating PO's. Lungs are clear and no signs suggestive of PNA. Low concern for appendicitis, cholecystitis, pancreatitis, ruptured viscus, UTI, kidney stone, aortic dissection, aortic aneurysm or other emergent abdominal etiology. Supportive therapy indicated with return if symptoms worsen.    ED Discharge Orders         Ordered    ciprofloxacin (CIPRO) 500 MG tablet  2 times daily     12/11/18 1506    metroNIDAZOLE (FLAGYL) 500 MG tablet  3 times daily     12/11/18 1506    ondansetron (ZOFRAN ODT) 4 MG disintegrating tablet  Every 8 hours PRN     12/11/18 1509           Carlisle Cater, PA-C 12/11/18 1511    Lennice Sites, DO 12/11/18 1723

## 2018-12-11 NOTE — ED Notes (Signed)
Got patient undress into a gown on the monitor patient is resting with call bell in reach and family at bedside 

## 2018-12-11 NOTE — Discharge Instructions (Signed)
Please read and follow all provided instructions.  Your diagnoses today include:  1. Colitis   2. Periumbilical abdominal pain     Tests performed today include:  Blood counts and electrolytes  Blood tests to check liver and kidney function  Blood tests to check pancreas function  Urine test to look for infection and pregnancy (in women)  Vital signs. See below for your results today.   Medications prescribed:   Ciprofloxacin - antibiotic  You have been prescribed an antibiotic medicine: take the entire course of medicine even if you are feeling better. Stopping early can cause the antibiotic not to work.   Metronidazole - antibiotic  You have been prescribed an antibiotic medicine: take the entire course of medicine even if you are feeling better. Stopping early can cause the antibiotic not to work. Do not drink alcohol when taking this medication.    Zofran (ondansetron) - for nausea and vomiting  Take any prescribed medications only as directed.  Home care instructions:   Follow any educational materials contained in this packet.  Follow-up instructions: Please follow-up with your gastroenterologist as planned.  Please call their office and let them know that you were seen in the emergency department today for colitis.  Return instructions:  SEEK IMMEDIATE MEDICAL ATTENTION IF:  The pain does not go away or becomes severe   A temperature above 101F develops   Repeated vomiting occurs (multiple episodes)   The pain becomes localized to portions of the abdomen. The right side could possibly be appendicitis. In an adult, the left lower portion of the abdomen could be colitis or diverticulitis.   Blood is being passed in stools or vomit (bright red or black tarry stools)   You develop chest pain, difficulty breathing, dizziness or fainting, or become confused, poorly responsive, or inconsolable (young children)  If you have any other emergent concerns regarding  your health  Additional Information: Abdominal (belly) pain can be caused by many things. Your caregiver performed an examination and possibly ordered blood/urine tests and imaging (CT scan, x-rays, ultrasound). Many cases can be observed and treated at home after initial evaluation in the emergency department. Even though you are being discharged home, abdominal pain can be unpredictable. Therefore, you need a repeated exam if your pain does not resolve, returns, or worsens. Most patients with abdominal pain don't have to be admitted to the hospital or have surgery, but serious problems like appendicitis and gallbladder attacks can start out as nonspecific pain. Many abdominal conditions cannot be diagnosed in one visit, so follow-up evaluations are very important.  Your vital signs today were: BP 108/64 (BP Location: Right Arm)    Pulse 77    Temp 98.2 F (36.8 C) (Oral)    Resp 18    SpO2 99%  If your blood pressure (bp) was elevated above 135/85 this visit, please have this repeated by your doctor within one month. --------------

## 2018-12-12 ENCOUNTER — Other Ambulatory Visit: Payer: Self-pay | Admitting: Nurse Practitioner

## 2018-12-12 DIAGNOSIS — I10 Essential (primary) hypertension: Secondary | ICD-10-CM

## 2018-12-12 MED FILL — TADALAFIL 20 MG TABS: 20 | 30 days supply | Qty: 10 | Fill #0

## 2018-12-16 ENCOUNTER — Encounter (HOSPITAL_COMMUNITY)
Admission: RE | Disposition: A | Discharge status: Home / Self Care | Payer: Self-pay | Source: Ambulatory Visit | Attending: Gastroenterology

## 2018-12-16 ENCOUNTER — Ambulatory Visit (HOSPITAL_COMMUNITY): Payer: Self-pay | Admitting: Certified Registered Nurse Anesthetist

## 2018-12-16 ENCOUNTER — Encounter (HOSPITAL_COMMUNITY): Payer: Self-pay | Admitting: Certified Registered Nurse Anesthetist

## 2018-12-16 ENCOUNTER — Other Ambulatory Visit: Payer: Self-pay

## 2018-12-16 ENCOUNTER — Ambulatory Visit (HOSPITAL_COMMUNITY)
Admission: RE | Admit: 2018-12-16 | Discharge: 2018-12-16 | Disposition: A | Discharge status: Home / Self Care | Payer: Self-pay | Source: Ambulatory Visit | Attending: Gastroenterology | Admitting: Gastroenterology

## 2018-12-16 DIAGNOSIS — K7460 Unspecified cirrhosis of liver: Secondary | ICD-10-CM | POA: Insufficient documentation

## 2018-12-16 DIAGNOSIS — G4733 Obstructive sleep apnea (adult) (pediatric): Secondary | ICD-10-CM | POA: Insufficient documentation

## 2018-12-16 DIAGNOSIS — K2210 Ulcer of esophagus without bleeding: Secondary | ICD-10-CM | POA: Insufficient documentation

## 2018-12-16 DIAGNOSIS — J449 Chronic obstructive pulmonary disease, unspecified: Secondary | ICD-10-CM | POA: Insufficient documentation

## 2018-12-16 DIAGNOSIS — F1021 Alcohol dependence, in remission: Secondary | ICD-10-CM | POA: Insufficient documentation

## 2018-12-16 DIAGNOSIS — I8510 Secondary esophageal varices without bleeding: Secondary | ICD-10-CM | POA: Insufficient documentation

## 2018-12-16 DIAGNOSIS — F17210 Nicotine dependence, cigarettes, uncomplicated: Secondary | ICD-10-CM | POA: Insufficient documentation

## 2018-12-16 DIAGNOSIS — Z79899 Other long term (current) drug therapy: Secondary | ICD-10-CM | POA: Insufficient documentation

## 2018-12-16 DIAGNOSIS — I10 Essential (primary) hypertension: Secondary | ICD-10-CM | POA: Insufficient documentation

## 2018-12-16 DIAGNOSIS — D649 Anemia, unspecified: Secondary | ICD-10-CM | POA: Insufficient documentation

## 2018-12-16 DIAGNOSIS — B1920 Unspecified viral hepatitis C without hepatic coma: Secondary | ICD-10-CM | POA: Insufficient documentation

## 2018-12-16 DIAGNOSIS — K2970 Gastritis, unspecified, without bleeding: Secondary | ICD-10-CM

## 2018-12-16 DIAGNOSIS — I8501 Esophageal varices with bleeding: Secondary | ICD-10-CM

## 2018-12-16 LAB — GLUCOSE, CAPILLARY: Glucose-Capillary: 104 mg/dL — ABNORMAL HIGH (ref 70–99)

## 2018-12-16 SURGERY — INVASIVE LAB ABORTED CASE
Anesthesia: Monitor Anesthesia Care

## 2018-12-16 MED ORDER — ALBUTEROL SULFATE (2.5 MG/3ML) 0.083% IN NEBU
2.5000 mg | INHALATION_SOLUTION | Freq: Once | RESPIRATORY_TRACT | Status: AC
  Administered 2018-12-16: 2.5 mg via RESPIRATORY_TRACT

## 2018-12-16 MED ORDER — PROPOFOL 10 MG/ML IV BOLUS
INTRAVENOUS | Status: AC
  Filled 2018-12-16: qty 60

## 2018-12-16 MED ORDER — LACTATED RINGERS IV SOLN
INTRAVENOUS | Status: DC
  Administered 2018-12-16: 1000 mL via INTRAVENOUS

## 2018-12-16 MED ORDER — PROPOFOL 500 MG/50ML IV EMUL
INTRAVENOUS | Status: DC | PRN
  Administered 2018-12-16: 150 ug/kg/min via INTRAVENOUS

## 2018-12-16 MED ORDER — LIDOCAINE 2% (20 MG/ML) 5 ML SYRINGE
INTRAMUSCULAR | Status: DC | PRN
  Administered 2018-12-16: 100 mg via INTRAVENOUS

## 2018-12-16 MED ORDER — ALBUTEROL SULFATE (2.5 MG/3ML) 0.083% IN NEBU
INHALATION_SOLUTION | RESPIRATORY_TRACT | Status: AC
  Filled 2018-12-16: qty 3

## 2018-12-16 MED ORDER — SODIUM CHLORIDE 0.9 % IV SOLN: INTRAVENOUS | Status: DC

## 2018-12-16 MED ORDER — PROPOFOL 10 MG/ML IV BOLUS
INTRAVENOUS | Status: DC | PRN
  Administered 2018-12-16: 40 mg via INTRAVENOUS

## 2018-12-16 SURGICAL SUPPLY — 14 items

## 2018-12-16 NOTE — Transfer of Care (Signed)
Immediate Anesthesia Transfer of Care Note  Patient: AUGUSTIN BUN  Procedure(s) Performed: ABORTED CASE  Patient Location: PACU and Endoscopy Unit  Anesthesia Type:MAC  Level of Consciousness: awake, alert  and oriented  Airway & Oxygen Therapy: Patient Spontanous Breathing and Patient connected to nasal cannula oxygen  Post-op Assessment: Report given to RN and Post -op Vital signs reviewed and stable  Post vital signs: Reviewed and stable  Last Vitals:  Vitals Value Taken Time  BP    Temp    Pulse 72 12/16/2018  7:42 AM  Resp 16 12/16/2018  7:42 AM  SpO2 100 % 12/16/2018  7:42 AM  Vitals shown include unvalidated device data.  Last Pain:  Vitals:   12/16/18 0647  TempSrc: Oral  PainSc: 0-No pain         Complications: No apparent anesthesia complications

## 2018-12-16 NOTE — Interval H&P Note (Signed)
History and Physical Interval Note:  12/16/2018 7:26 AM  Levi Alvarez  has presented today for surgery, with the diagnosis of cirrhosis- banding varicies  The various methods of treatment have been discussed with the patient and family. After consideration of risks, benefits and other options for treatment, the patient has consented to  Procedure(s): ESOPHAGOGASTRODUODENOSCOPY (EGD) WITH PROPOFOL (N/A) as a surgical intervention .  The patient's history has been reviewed, patient examined, no change in status, stable for surgery.  I have reviewed the patient's chart and labs.  Questions were answered to the patient's satisfaction.     [place]

## 2018-12-16 NOTE — Anesthesia Postprocedure Evaluation (Signed)
Anesthesia Post Note  Patient: Levi Alvarez  Procedure(s) Performed: ABORTED CASE     Patient location during evaluation: PACU Anesthesia Type: MAC Level of consciousness: awake and alert Pain management: pain level controlled Vital Signs Assessment: post-procedure vital signs reviewed and stable Respiratory status: spontaneous breathing, nonlabored ventilation and respiratory function stable Cardiovascular status: blood pressure returned to baseline and stable Postop Assessment: no apparent nausea or vomiting Anesthetic complications: no    Last Vitals:  Vitals:   12/16/18 0750 12/16/18 0800  BP: (!) 115/54 111/61  Pulse: 72 70  Resp: 14 18  Temp:    SpO2: 100% 97%    Last Pain:  Vitals:   12/16/18 0800  TempSrc:   PainSc: 0-No pain                 Lidia Collum

## 2018-12-16 NOTE — H&P (Signed)
HPI:   Levi Alvarez is a 48 y.o. 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 Had EGD with banding on 11/25/18, he did well and eventually discharged. Since hospitalization he has completely stopped drinking by his own accord. No further bleeding symptoms. Generally feeling okay at this time. Labs on 12/11/18 showed Hgb of 9.1 with normal platelets. Prior INR okay.  Here for repeat EGD with banding.   Past Medical History:  Diagnosis Date  . Hepatitis C antibody positive in blood   . High blood pressure     Past Surgical History:  Procedure Laterality Date  . arm surgery    . ESOPHAGEAL BANDING  11/25/2018   Procedure: ESOPHAGEAL BANDING;  Surgeon: Milus Banister, MD;  Location: [facility];  Service: Endoscopy;;  . ESOPHAGOGASTRODUODENOSCOPY (EGD) WITH PROPOFOL N/A 11/25/2018   Procedure: ESOPHAGOGASTRODUODENOSCOPY (EGD) WITH PROPOFOL;  Surgeon: Milus Banister, MD;  Location: [facility];  Service: Endoscopy;  Laterality: N/A;  . KNEE SURGERY      Family History  Problem Relation Age of Onset  . Colitis Mother   . Arthritis Mother   . Cancer Father        lymphoma?  . Cancer Paternal Grandfather        bone  . Heart disease Neg Hx   . Stroke Neg Hx   . Diabetes Neg Hx      Social History   Tobacco Use  . Smoking status: Current Every Day Smoker    Packs/day: 1.00    Types: Cigarettes  . Smokeless tobacco: Former Systems developer    Types: Chew  Substance Use Topics  . Alcohol use: Yes    Alcohol/week: 18.0 standard drinks    Types: 18 Cans of beer per week    Frequency: Never    Comment: daily  . Drug use: No    Prior to Admission medications   Medication Sig Start Date End Date Taking? Authorizing Provider  albuterol (PROVENTIL) (2.5 MG/3ML) 0.083% nebulizer solution Take 3 mLs (2.5 mg total) by nebulization every 6 (six)  hours as needed for wheezing or shortness of breath. 12/02/18  Yes Gildardo Pounds, NP  alum & mag hydroxide-simeth (MAALOX/MYLANTA) 200-200-20 MG/5ML suspension Take 30 mLs by mouth every 4 (four) hours as needed for indigestion or heartburn. 11/28/18  Yes Nita Sells, MD  Ascorbic Acid (VITAMIN C PO) Take 1 tablet by mouth daily.   Yes [provider]  BREO ELLIPTA 200-25 MCG/INH AEPB INHALE 1 PUFF DAILY INTO THE LUNGS. Patient taking differently: Inhale 1 puff into the lungs.  09/11/18  Yes Gildardo Pounds, NP  carvedilol (COREG) 3.125 MG tablet TAKE 1 TABLET BY MOUTH 2 TIMES DAILY. 12/15/18  Yes Gildardo Pounds, NP  chlordiazePOXIDE ([name]) 5 MG capsule Take 1 capsule (5 mg total) by mouth 2 (two) times daily for 14 days. 12/02/18 12/16/18 Yes Gildardo Pounds, NP  ciprofloxacin (CIPRO) 500 MG tablet Take 1 tablet (500 mg total) by mouth 2 (two) times daily. 12/11/18  Yes Carlisle Cater, PA-C  lidocaine (LIDODERM) 5 % Place 1 patch onto the skin daily. Remove & Discard patch within 12 hours or as directed by MD 08/05/18  Yes Gildardo Pounds, NP  metroNIDAZOLE (FLAGYL) 500 MG tablet Take 1 tablet (500 mg total) by mouth 3 (three) times daily. 12/11/18  Yes Carlisle Cater,  PA-C  MILK THISTLE PO Take 1 tablet by mouth daily.   Yes [provider]  Multiple Vitamin (MULTI-VITAMINS) TABS Take 1 tablet by mouth 1 day or 1 dose. 04/04/15  Yes [provider]  ondansetron (ZOFRAN ODT) 4 MG disintegrating tablet Take 1 tablet (4 mg total) by mouth every 8 (eight) hours as needed for nausea or vomiting. 12/11/18  Yes Carlisle Cater, PA-C  pantoprazole (PROTONIX) 40 MG tablet Take 1 tablet (40 mg total) by mouth 2 (two) times daily before a meal for 30 days. 12/02/18 01/01/19 Yes Gildardo Pounds, NP  spironolactone (ALDACTONE) 50 MG tablet TAKE 1 TABLET (50 MG TOTAL) BY MOUTH DAILY. 12/15/18  Yes Gildardo Pounds, NP  tadalafil, PAH, (ADCIRCA) 20 MG tablet TAKE 1/2-1 TABLET BY  MOUTH EVERY OTHER DAY AS NEEDED FOR ERECTILE DYSFUNCTION. Patient taking differently: Take 10-20 mg by mouth every other day. PRN for erectile dysfunction 11/03/18  Yes Gildardo Pounds, NP  VENTOLIN HFA 108 (90 Base) MCG/ACT inhaler INHALE 1 PUFF EVERY 6 (SIX) HOURS AS NEEDED INTO THE LUNGS FOR WHEEZING OR SHORTNESS OF BREATH. 12/09/18  Yes Gildardo Pounds, NP  loratadine (CLARITIN) 10 MG tablet Take 10 mg by mouth daily.    [provider]    Current Facility-Administered Medications  Medication Dose Route Frequency Provider Last Rate Last Dose  . 0.9 %  sodium chloride infusion   Intravenous Continuous Levon Penning, Carlota Raspberry, MD      . lactated ringers infusion   Intravenous Continuous Havery Moros Carlota Raspberry, MD 10 mL/hr at 12/16/18 0703 1,000 mL at 12/16/18 0703    Allergies as of 11/28/2018 - Review Complete 11/25/2018  Allergen Reaction Noted  . Naproxen Hives and Swelling 03/03/2015     Review of Systems:    As per HPI, otherwise negative    Physical Exam:  Vital signs in last 24 hours: Temp:  [98.6 F (37 C)] 98.6 F (37 C) (02/04 0647) Pulse Rate:  [75] 75 (02/04 0647) Resp:  [18] 18 (02/04 0647) BP: (121)/(65) 121/65 (02/04 0647) SpO2:  [95 %] 95 % (02/04 0647) Weight:  [962 kg] 115 kg (02/04 0647)   General:   Pleasant male in NAD Lungs:  Respirations even and unlabored.  Heart:  Regular rate and rhythm;  Abdomen:  Soft, nondistended, nontender. . No appreciable masses Extremities:  Without edema. Neurologic:  Alert and  oriented x4;  grossly normal neurologically. Skin:  Intact without significant lesions or rashes. Psych:  Alert and cooperative. Normal affect.  Lab Results  Component Value Date   WBC 10.4 12/11/2018   HGB 9.1 (L) 12/11/2018   HCT 30.8 (L) 12/11/2018   MCV 81.9 12/11/2018   PLT 292 12/11/2018    Lab Results  Component Value Date   INR 1.25 11/26/2018   INR 1.32 11/24/2018   INR 1.1 10/08/2017    Lab Results  Component Value  Date   CREATININE 0.80 12/11/2018   BUN 6 12/11/2018   NA 135 12/11/2018   K 4.3 12/11/2018   CL 105 12/11/2018   CO2 25 12/11/2018      Impression / Plan:   48 y/o male with a history of alcoholism and hep C, here for EGD with banding of esophageal varices as outlined above. I have discussed risks / benefits of EGD and banding, as well as anesthesia with him. He agrees with the plan and wishes to proceed. Further recommendations pending results.    Carlota Raspberry Jameon Deller  12/16/2018,  7:22 AM

## 2018-12-16 NOTE — Discharge Instructions (Signed)
YOU HAD AN ENDOSCOPIC PROCEDURE TODAY: Refer to the procedure report and other information in the discharge instructions given to you for any specific questions about what was found during the examination. If this information does not answer your questions, please call [place] office at [phone] to clarify.  ° °YOU SHOULD EXPECT: Some feelings of bloating in the abdomen. Passage of more gas than usual. Walking can help get rid of the air that was put into your GI tract during the procedure and reduce the bloating.. ° °DIET: Your first meal following the procedure should be a light meal and then it is ok to progress to your normal diet. A half-sandwich or bowl of soup is an example of a good first meal. Heavy or fried foods are harder to digest and may make you feel nauseous or bloated. Drink plenty of fluids but you should avoid alcoholic beverages for 24 hours. If you had a esophageal dilation, please see attached instructions for diet.   ° °ACTIVITY: Your care partner should take you home directly after the procedure. You should plan to take it easy, moving slowly for the rest of the day. You can resume normal activity the day after the procedure however YOU SHOULD NOT DRIVE, use power tools, machinery or perform tasks that involve climbing or major physical exertion for 24 hours (because of the sedation medicines used during the test).  ° °SYMPTOMS TO REPORT IMMEDIATELY: °A gastroenterologist can be reached at any hour. Please call [phone]  for any of the following symptoms:  ° °Following upper endoscopy (EGD, EUS, ERCP, esophageal dilation) °Vomiting of blood or coffee ground material  °New, significant abdominal pain  °New, significant chest pain or pain under the shoulder blades  °Painful or persistently difficult swallowing  °New shortness of breath  °Black, tarry-looking or red, bloody stools ° °FOLLOW UP:  °If any biopsies were taken you will be contacted by phone or by letter within the next 1-3  weeks. Call [phone]  if you have not heard about the biopsies in 3 weeks.  °Please also call with any specific questions about appointments or follow up tests. ° °

## 2018-12-16 NOTE — Op Note (Signed)
[facility] Patient Name: Levi Alvarez Procedure Date: 12/16/2018 MRN: [id number] Attending MD: Carlota Raspberry. Havery Moros , MD Date of Birth: Sep 11, 1971 CSN: [id number] Age: 48 Admit Type: Outpatient Procedure:                Upper GI endoscopy Indications:              For therapy of esophageal varices Providers:                Remo Lipps P. Havery Moros, MD, Burtis Junes, RN, Elspeth Cho Tech., Technician, Stephanie [place] CRNA Referring MD:              Medicines:                Monitored Anesthesia Care Complications:            No immediate complications. Estimated blood loss:                            None. Estimated Blood Loss:     Estimated blood loss: none. Procedure:                Pre-Anesthesia Assessment:                           - Prior to the procedure, a History and Physical                            was performed, and patient medications and                            allergies were reviewed. The patient's tolerance of                            previous anesthesia was also reviewed. The risks                            and benefits of the procedure and the sedation                            options and risks were discussed with the patient.                            All questions were answered, and informed consent                            was obtained. Prior Anticoagulants: The patient has                            taken no previous anticoagulant or antiplatelet                            agents. ASA Grade Assessment: III - A patient with  severe systemic disease. After reviewing the risks                            and benefits, the patient was deemed in                            satisfactory condition to undergo the procedure.                           After obtaining informed consent, the endoscope was                            passed under direct vision. Throughout the     procedure, the patient's blood pressure, pulse, and                            oxygen saturations were monitored continuously. The                            [id number]-[id number] ([id number]) Olympus gastroscope was                            introduced through the mouth, and advanced to the                            body of the stomach. The upper GI endoscopy was                            accomplished without difficulty. The patient                            tolerated the procedure well. Scope In: Scope Out: Findings:      Grade II varices were found in the lower third of the esophagus, with       some healing ulcerations / stigmata of prior banding. Overall they were       smaller than previously reported on index endoscopy a few weeks ago.       Additional banding would have been performed today however due to large       volume of food in the patient's stomach, the procedure was aborted.      The exam of the esophagus was otherwise normal.      A large amount of food was found in the gastric fundus and in the       gastric body, the entire stomach was not examined and the procedure was       aborted given this finding. Impression:               - Grade II esophageal varices with stigmata of                            prior banding / healing ulcerations - overall                            improved from previous exam however further banding  not done today due to large amount of retained food                            in the stomach. Moderate Sedation:      No moderate sedation, case performed with MAC Recommendation:           - Patient has a contact number available for                            emergencies. The signs and symptoms of potential                            delayed complications were discussed with the                            patient. Return to normal activities tomorrow.                            Written discharge instructions were provided to  the                            patient.                           - Resume previous diet.                           - Continue present medications.                           - Repeat upper endoscopy for retreatment in the                            upcoming weeks - this will be scheduled by our                            office. Recommend clear liquid meal the evening                            prior (no solids evening prior to procedure) and                            dose of Reglan pre-procedure. Will discuss with                            patient. Also recommend office visit to address                            other issues and hospital follow up. Procedure Code(s):        --- Professional ---                           [id number], 52, Esophagogastroduodenoscopy, flexible,  transoral; diagnostic, including collection of                            specimen(s) by brushing or washing, when performed                            (separate procedure) Diagnosis Code(s):        --- Professional ---                           I85.00, Esophageal varices without bleeding CPT copyright 2018 American Medical Association. All rights reserved. The codes documented in this report are preliminary and upon coder review may  be revised to meet current compliance requirements. Remo Lipps P. Havery Moros, MD 12/16/2018 7:39:46 AM This report has been signed electronically. Number of Addenda: 0

## 2018-12-16 NOTE — Anesthesia Preprocedure Evaluation (Addendum)
Anesthesia Evaluation  Patient identified by MRN, date of birth, ID band Patient awake    Reviewed: Allergy & Precautions, NPO status , Patient's Chart, lab work & pertinent test results, reviewed documented beta blocker date and time   History of Anesthesia Complications Negative for: history of anesthetic complications  Airway Mallampati: II  TM Distance: >3 FB Neck ROM: Full    Dental  (+) Poor Dentition   Pulmonary sleep apnea , COPD, Current Smoker,    Pulmonary exam normal        Cardiovascular hypertension, Pt. on medications and Pt. on home beta blockers Normal cardiovascular exam     Neuro/Psych negative neurological ROS  negative psych ROS   GI/Hepatic negative GI ROS, (+) Cirrhosis   Esophageal Varices  substance abuse  alcohol use, Hepatitis -, C  Endo/Other  negative endocrine ROS  Renal/GU negative Renal ROS  negative genitourinary   Musculoskeletal negative musculoskeletal ROS (+)   Abdominal (+) + obese,   Peds  Hematology  (+) anemia ,   Anesthesia Other Findings 48 yo M for EGD - PMH: COPD, OSA, current smoker, HTN, EtOH abuse, HCV cirrhosis w/ esophageal varices s/p banding, anemia (Hgb 9.1)  Reproductive/Obstetrics                            Anesthesia Physical Anesthesia Plan  ASA: III  Anesthesia Plan: MAC   Post-op Pain Management:    Induction:   PONV Risk Score and Plan: 0 and Propofol infusion and Treatment may vary due to age or medical condition  Airway Management Planned: Nasal Cannula and Simple Face Mask  Additional Equipment: None  Intra-op Plan:   Post-operative Plan:   Informed Consent: I have reviewed the patients History and Physical, chart, labs and discussed the procedure including the risks, benefits and alternatives for the proposed anesthesia with the patient or authorized representative who has indicated his/her understanding and  acceptance.       Plan Discussed with:   Anesthesia Plan Comments:        Anesthesia Quick Evaluation

## 2018-12-17 ENCOUNTER — Telehealth: Payer: Self-pay

## 2018-12-17 ENCOUNTER — Ambulatory Visit: Payer: Self-pay | Attending: Family Medicine

## 2018-12-17 ENCOUNTER — Ambulatory Visit: Payer: No Typology Code available for payment source

## 2018-12-17 ENCOUNTER — Other Ambulatory Visit: Payer: Self-pay

## 2018-12-17 DIAGNOSIS — R748 Abnormal levels of other serum enzymes: Secondary | ICD-10-CM

## 2018-12-17 DIAGNOSIS — E876 Hypokalemia: Secondary | ICD-10-CM

## 2018-12-17 DIAGNOSIS — K7460 Unspecified cirrhosis of liver: Secondary | ICD-10-CM

## 2018-12-17 NOTE — Telephone Encounter (Signed)
Called and LM for pt that he is scheduled for EGD at [facility] on 3-9 for banding of varices. Procedure at 8:15am to arrive at 6:45am. Will need transportation.  Pt. Has an appt with [facility] on 2-24.  Will need to be instructed at that appt.  Asked pt to call back to confirm date and time

## 2018-12-17 NOTE — Telephone Encounter (Signed)
-----   Message from Yetta Flock, MD sent at 12/16/2018  9:24 AM EST ----- Regarding: another hospital EGD Jan this patient also needs an EGD with banding of varices during the open March slot if you can help schedule, no rush. Thanks

## 2018-12-17 NOTE — Telephone Encounter (Signed)
Called and spoke to patient's girlfriend, [name].  She indicated Dr. Havery Moros discussed the possibility of him having an ECL. She also is unable to bring him that early in the morning due to her work schedule.  Spoke with Dr. Havery Moros. He confirmed ECL and Kelly with 12:00pm.  Called Scheduling and Moved pt to 12:00pm and changed his procedure to an ECL.

## 2018-12-17 NOTE — Progress Notes (Signed)
MA reordered abnormal labs from hospital visit based on PCP requesting patient return for 2 week lab recheck.

## 2018-12-18 LAB — CBC WITH DIFFERENTIAL/PLATELET
Basophils Absolute: 0.1 10*3/uL (ref 0.0–0.2)
Basos: 1 %
EOS (ABSOLUTE): 0.3 10*3/uL (ref 0.0–0.4)
Eos: 3 %
Hematocrit: 27.5 % — ABNORMAL LOW (ref 37.5–51.0)
Hemoglobin: 8.7 g/dL — ABNORMAL LOW (ref 13.0–17.7)
IMMATURE GRANULOCYTES: 0 %
Immature Grans (Abs): 0 10*3/uL (ref 0.0–0.1)
Lymphocytes Absolute: 2.3 10*3/uL (ref 0.7–3.1)
Lymphs: 23 %
MCH: 24.7 pg — ABNORMAL LOW (ref 26.6–33.0)
MCHC: 31.6 g/dL (ref 31.5–35.7)
MCV: 78 fL — ABNORMAL LOW (ref 79–97)
Monocytes Absolute: 1.3 10*3/uL — ABNORMAL HIGH (ref 0.1–0.9)
Monocytes: 13 %
Neutrophils Absolute: 5.9 10*3/uL (ref 1.4–7.0)
Neutrophils: 60 %
Platelets: 295 10*3/uL (ref 150–450)
RBC: 3.52 x10E6/uL — ABNORMAL LOW (ref 4.14–5.80)
RDW: 17.7 % — AB (ref 11.6–15.4)
WBC: 9.9 10*3/uL (ref 3.4–10.8)

## 2018-12-18 LAB — COMPREHENSIVE METABOLIC PANEL
ALT: 43 IU/L (ref 0–44)
AST: 93 IU/L — ABNORMAL HIGH (ref 0–40)
Albumin/Globulin Ratio: 1.2 (ref 1.2–2.2)
Albumin: 3.6 g/dL — ABNORMAL LOW (ref 4.0–5.0)
Alkaline Phosphatase: 84 IU/L (ref 39–117)
BUN/Creatinine Ratio: 11 (ref 9–20)
BUN: 8 mg/dL (ref 6–24)
Bilirubin Total: 0.5 mg/dL (ref 0.0–1.2)
CO2: 21 mmol/L (ref 20–29)
Calcium: 8.6 mg/dL — ABNORMAL LOW (ref 8.7–10.2)
Chloride: 103 mmol/L (ref 96–106)
Creatinine, Ser: 0.74 mg/dL — ABNORMAL LOW (ref 0.76–1.27)
GFR calc Af Amer: 127 mL/min/{1.73_m2} (ref >59)
GFR calc non Af Amer: 110 mL/min/{1.73_m2} (ref >59)
Globulin, Total: 2.9 g/dL (ref 1.5–4.5)
Glucose: 103 mg/dL — ABNORMAL HIGH (ref 65–99)
Potassium: 4.3 mmol/L (ref 3.5–5.2)
Sodium: 135 mmol/L (ref 134–144)
Total Protein: 6.5 g/dL (ref 6.0–8.5)

## 2018-12-18 LAB — LIPASE: Lipase: 85 U/L — ABNORMAL HIGH (ref 13–78)

## 2018-12-23 ENCOUNTER — Telehealth: Payer: Self-pay | Admitting: Nurse Practitioner

## 2018-12-23 NOTE — Telephone Encounter (Signed)
Patient girlfriend called because they have not been able to get in contact with anna limon. Please follow up.

## 2018-12-23 NOTE — Telephone Encounter (Signed)
Pt was informed, that he still medicaid pending, need to get in-touch with [employer]

## 2019-01-02 ENCOUNTER — Ambulatory Visit (INDEPENDENT_AMBULATORY_CARE_PROVIDER_SITE_OTHER): Payer: Self-pay | Admitting: Orthopaedic Surgery

## 2019-01-06 ENCOUNTER — Telehealth: Payer: Self-pay

## 2019-01-06 ENCOUNTER — Encounter: Payer: Self-pay | Admitting: Gastroenterology

## 2019-01-06 ENCOUNTER — Other Ambulatory Visit (INDEPENDENT_AMBULATORY_CARE_PROVIDER_SITE_OTHER): Payer: Self-pay

## 2019-01-06 ENCOUNTER — Ambulatory Visit (INDEPENDENT_AMBULATORY_CARE_PROVIDER_SITE_OTHER): Payer: No Typology Code available for payment source | Admitting: Gastroenterology

## 2019-01-06 VITALS — BP 120/78 | HR 72 | Ht 70.0 in | Wt 235.0 lb

## 2019-01-06 DIAGNOSIS — K7030 Alcoholic cirrhosis of liver without ascites: Secondary | ICD-10-CM

## 2019-01-06 DIAGNOSIS — D649 Anemia, unspecified: Secondary | ICD-10-CM

## 2019-01-06 DIAGNOSIS — F1020 Alcohol dependence, uncomplicated: Secondary | ICD-10-CM

## 2019-01-06 DIAGNOSIS — I8500 Esophageal varices without bleeding: Secondary | ICD-10-CM

## 2019-01-06 DIAGNOSIS — K5900 Constipation, unspecified: Secondary | ICD-10-CM

## 2019-01-06 DIAGNOSIS — Z8619 Personal history of other infectious and parasitic diseases: Secondary | ICD-10-CM

## 2019-01-06 DIAGNOSIS — R935 Abnormal findings on diagnostic imaging of other abdominal regions, including retroperitoneum: Secondary | ICD-10-CM

## 2019-01-06 LAB — CBC WITH DIFFERENTIAL/PLATELET
Basophils Absolute: 0.1 10*3/uL (ref 0.0–0.1)
Basophils Relative: 0.8 % (ref 0.0–3.0)
Eosinophils Absolute: 0.2 10*3/uL (ref 0.0–0.7)
Eosinophils Relative: 2 % (ref 0.0–5.0)
HCT: 32 % — ABNORMAL LOW (ref 39.0–52.0)
HEMOGLOBIN: 9.9 g/dL — AB (ref 13.0–17.0)
LYMPHS PCT: 24.9 % (ref 12.0–46.0)
Lymphs Abs: 2.9 10*3/uL (ref 0.7–4.0)
MCHC: 30.9 g/dL (ref 30.0–36.0)
MCV: 74.9 fl — ABNORMAL LOW (ref 78.0–100.0)
MONOS PCT: 12.4 % — AB (ref 3.0–12.0)
Monocytes Absolute: 1.4 10*3/uL — ABNORMAL HIGH (ref 0.1–1.0)
Neutro Abs: 6.9 10*3/uL (ref 1.4–7.7)
Neutrophils Relative %: 59.9 % (ref 43.0–77.0)
Platelets: 243 10*3/uL (ref 150.0–400.0)
RBC: 4.27 Mil/uL (ref 4.22–5.81)
RDW: 20.8 % — ABNORMAL HIGH (ref 11.5–15.5)
WBC: 11.5 10*3/uL — ABNORMAL HIGH (ref 4.0–10.5)

## 2019-01-06 LAB — IBC + FERRITIN
Ferritin: 9 ng/mL — ABNORMAL LOW (ref 22.0–322.0)
IRON: 25 ug/dL — AB (ref 42–165)
Saturation Ratios: 4.8 % — ABNORMAL LOW (ref 20.0–50.0)
Transferrin: 375 mg/dL — ABNORMAL HIGH (ref 212.0–360.0)

## 2019-01-06 LAB — COMPREHENSIVE METABOLIC PANEL
ALT: 29 U/L (ref 0–53)
AST: 37 U/L (ref 0–37)
Albumin: 3.8 g/dL (ref 3.5–5.2)
Alkaline Phosphatase: 96 U/L (ref 39–117)
BUN: 9 mg/dL (ref 6–23)
CALCIUM: 9.5 mg/dL (ref 8.4–10.5)
CO2: 26 mEq/L (ref 19–32)
Chloride: 98 mEq/L (ref 96–112)
Creatinine, Ser: 0.76 mg/dL (ref 0.40–1.50)
GFR: 109.89 mL/min (ref >60.00)
Glucose, Bld: 91 mg/dL (ref 70–99)
Potassium: 4.2 mEq/L (ref 3.5–5.1)
Sodium: 132 mEq/L — ABNORMAL LOW (ref 135–145)
Total Bilirubin: 0.7 mg/dL (ref 0.2–1.2)
Total Protein: 8 g/dL (ref 6.0–8.3)

## 2019-01-06 LAB — TSH: TSH: 3.84 u[IU]/mL (ref 0.35–4.50)

## 2019-01-06 LAB — FOLATE: Folate: >24 ng/mL (ref >5.9)

## 2019-01-06 LAB — VITAMIN B12: Vitamin B-12: 847 pg/mL (ref 211–911)

## 2019-01-06 MED ORDER — LACTULOSE 20 GM/30ML PO SOLN: 30.0000 mL | Freq: Every day | ORAL | 1 refills | Status: DC

## 2019-01-06 MED ORDER — METOCLOPRAMIDE HCL 10 MG PO TABS: ORAL_TABLET | ORAL | 0 refills | Status: DC

## 2019-01-06 NOTE — Telephone Encounter (Signed)
Moved pt up to the 10:00am spot on Monday, 3-9 for ECL at [facility]. To arrive at 8:30am. Spoke to pt's girlfriend and reviewed the times he needs to drink prep, take meds, etc.  She expressed understanding that he will need to be NPO after 6:00am

## 2019-01-06 NOTE — Progress Notes (Signed)
HPI :  48 year old male here for follow-up visit. 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 He has previously been treated for hepatitis C with undetectable viral load. Unfortunately since I last saw him he had a relapse of alcohol was drinking upwards of a case of beer a day. He admitted to the hospital in January for hematemesis and anemia.  EGD done per Dr. Ardis Hughs, esophageal varices noted with high risk stigmata and banded x 6. He recovered from his hospitalization and was discharged on Coreg as well as PPI. He was counseled to abstain from alcohol. He reports he has been sober for the past 44 days has been doing quite well with this. He saw me on February 4 for repeat EGD with further varices banding, varices were improved in size although were amenable to banding however unfortunately he had a large volume of food in stomach and the procedure was aborted.  General he is been doing pretty well since of last seen him. He has been on Aldactone 50 mg day and no problems with lower extremity edema or ascites. He does have some constipation which bothers him at times. His wife reports he seems confused at times if he is not having regular bowel movements. He has been compliant with his medications. He is lost about 30 pounds since he's stopped drinking alcohol. CT scan during his admission showed no evidence of HCC. It did show some nonspecific thickening of his colon. He denies any blood in his stools. Otherwise no other abdominal pains. He denies any family history of colon cancer or upper tract cancers.   Of note, prior labs for liver disease negative but has not been screened for Wilson's or alpha one antitrypsin. He has been vaccinated to hep A and B and completed the series in 2019.  EGD 01/06/2018 - 2cm HH, no varices, portal hypertensive gastriits EGD 11/25/18 - large esophageal varices, one with red whale sign - banded x 6, severe portal hypertensive  gastritis EGD 12/16/18 - grade II varices in the lower esophagus, procedure was aborted due to large volume of food in the stomach, no banding done  CT scan 12/11/2008 - cirrhotic appearing liver, ? Wall thickening of the ascending and proximal descending colon, small left inguinal and umbilical hernias  Past Medical History:  Diagnosis Date  . Alcoholism ([place])   . Cirrhosis ([place])    secondary to alcohol and hep C  . Esophageal varices ([place])   . Hepatitis C antibody positive in blood   . High blood pressure      Past Surgical History:  Procedure Laterality Date  . arm surgery    . ESOPHAGEAL BANDING  11/25/2018   Procedure: ESOPHAGEAL BANDING;  Surgeon: Milus Banister, MD;  Location: [facility];  Service: Endoscopy;;  . ESOPHAGOGASTRODUODENOSCOPY (EGD) WITH PROPOFOL N/A 11/25/2018   Procedure: ESOPHAGOGASTRODUODENOSCOPY (EGD) WITH PROPOFOL;  Surgeon: Milus Banister, MD;  Location: [facility];  Service: Endoscopy;  Laterality: N/A;  . KNEE SURGERY     Family History  Problem Relation Age of Onset  . Colitis Mother   . Arthritis Mother   . Cancer Father        lymphoma?  . Cancer Paternal Grandfather        bone  . Heart disease Neg Hx   . Stroke Neg Hx   . Diabetes Neg Hx    Social History   Tobacco Use  . Smoking status: Current Every  Day Smoker    Packs/day: 1.00    Types: Cigarettes  . Smokeless tobacco: Former Systems developer    Types: Chew  Substance Use Topics  . Alcohol use: Yes    Alcohol/week: 18.0 standard drinks    Types: 18 Cans of beer per week    Frequency: Never    Comment: daily  . Drug use: No   Current Outpatient Medications  Medication Sig Dispense Refill  . albuterol (PROVENTIL) (2.5 MG/3ML) 0.083% nebulizer solution Take 3 mLs (2.5 mg total) by nebulization every 6 (six) hours as needed for wheezing or shortness of breath. 150 mL prn  . alum & mag hydroxide-simeth (MAALOX/MYLANTA) 200-200-20 MG/5ML suspension Take 30 mLs by mouth every 4 (four)  hours as needed for indigestion or heartburn. 355 mL 0  . Ascorbic Acid (VITAMIN C PO) Take 1 tablet by mouth daily.    Marland Kitchen BREO ELLIPTA 200-25 MCG/INH AEPB INHALE 1 PUFF DAILY INTO THE LUNGS. (Patient taking differently: Inhale 1 puff into the lungs. ) 60 each 2  . carvedilol (COREG) 3.125 MG tablet TAKE 1 TABLET BY MOUTH 2 TIMES DAILY. 60 tablet 2  . loratadine (CLARITIN) 10 MG tablet Take 10 mg by mouth daily.    Marland Kitchen MILK THISTLE PO Take 1 tablet by mouth daily.    . Multiple Vitamin (MULTI-VITAMINS) TABS Take 1 tablet by mouth 1 day or 1 dose.    . spironolactone (ALDACTONE) 50 MG tablet TAKE 1 TABLET (50 MG TOTAL) BY MOUTH DAILY. 30 tablet 2  . tadalafil, PAH, (ADCIRCA) 20 MG tablet TAKE 1/2-1 TABLET BY MOUTH EVERY OTHER DAY AS NEEDED FOR ERECTILE DYSFUNCTION. (Patient taking differently: Take 10-20 mg by mouth every other day. PRN for erectile dysfunction) 10 tablet 1  . VENTOLIN HFA 108 (90 Base) MCG/ACT inhaler INHALE 1 PUFF EVERY 6 (SIX) HOURS AS NEEDED INTO THE LUNGS FOR WHEEZING OR SHORTNESS OF BREATH. 18 g 1  . Lactulose 20 GM/30ML SOLN Take 30 mLs (20 g total) by mouth daily. 450 mL 1  . metoCLOPramide (REGLAN) 10 MG tablet Take 10 mg by mouth at 5:30pm before beginning the prep the day before your colonoscopy/endoscopy 1 tablet 0  . pantoprazole (PROTONIX) 40 MG tablet Take 1 tablet (40 mg total) by mouth 2 (two) times daily before a meal for 30 days. 60 tablet 2   Current Facility-Administered Medications  Medication Dose Route Frequency Provider Last Rate Last Dose  . 0.9 %  sodium chloride infusion  500 mL Intravenous Continuous Mannie Ohlin, Carlota Raspberry, MD       Allergies  Allergen Reactions  . Naproxen Hives and Swelling     Review of Systems: All systems reviewed and negative except where noted in HPI.    Ct Abdomen Pelvis W Contrast  Result Date: 12/11/2018 CLINICAL DATA:  Generalized periumbilical abdominal pain, tremors, hypertension, shakiness, history of cirrhosis,  heavy ethanol intake, chronic hepatitis-C, COPD, smoker EXAM: CT ABDOMEN AND PELVIS WITH CONTRAST TECHNIQUE: Multidetector CT imaging of the abdomen and pelvis was performed using the standard protocol following bolus administration of intravenous contrast. Sagittal and coronal MPR images reconstructed from axial data set. CONTRAST:  179mL ISOVUE-300 IOPAMIDOL (ISOVUE-300) INJECTION 61% IV. No oral contrast. COMPARISON:  12/09/2017 FINDINGS: Lower chest: Minimal subsegmental atelectasis LEFT lower lobe Hepatobiliary: Irregular cirrhotic appearing liver without focal mass. Gallbladder unremarkable. No biliary dilatation. Pancreas: Normal appearance Spleen: Normal size and appearance Adrenals/Urinary Tract: Adrenal glands normal appearance. Kidneys, ureters, and bladder normal appearance Stomach/Bowel: Normal appendix. Questionable mild diffuse  colonic wall thickening ascending through descending colon question colitis. Stomach and remaining bowel loops unremarkable. Vascular/Lymphatic: Atherosclerotic calcifications aorta without aneurysm. Scattered collaterals in the upper abdomen. Major venous structures appear grossly patent. No adenopathy. Reproductive: Unremarkable Other: Small LEFT inguinal and umbilical hernias containing fat. Scattered edema within mesentery and within retroperitoneal tissue planes extending in the pelvis. No free air. Minimal perihepatic and perisplenic ascites. Musculoskeletal: No acute osseous findings. IMPRESSION: Cirrhotic appearing liver with scattered collaterals in the upper abdomen, scattered mesenteric and retroperitoneal edema, and minimal ascites. Questionable wall thickening of the ascending to proximal descending colon cannot exclude colitis. Small LEFT inguinal and umbilical hernias containing fat. Electronically Signed   By: Lavonia Dana M.D.   On: 12/11/2018 12:43    Lab Results  Component Value Date   WBC 9.9 12/17/2018   HGB 8.7 (L) 12/17/2018   HCT 27.5 (L)  12/17/2018   MCV 78 (L) 12/17/2018   PLT 295 12/17/2018    Lab Results  Component Value Date   CREATININE 0.74 (L) 12/17/2018   BUN 8 12/17/2018   NA 135 12/17/2018   K 4.3 12/17/2018   CL 103 12/17/2018   CO2 21 12/17/2018    Lab Results  Component Value Date   ALT 43 12/17/2018   AST 93 (H) 12/17/2018   ALKPHOS 84 12/17/2018   BILITOT 0.5 12/17/2018    Lab Results  Component Value Date   INR 1.25 11/26/2018   INR 1.32 11/24/2018   INR 1.1 10/08/2017     Physical Exam: BP 120/78   Pulse 72   Ht 5\' 10"  (1.778 m)   Wt 235 lb (106.6 kg)   BMI 33.72 kg/m  Constitutional: Pleasant, male in no acute distress. HEENT: Normocephalic and atraumatic. No scleral icterus. Neck supple.  Cardiovascular: Normal rate, regular rhythm.  Pulmonary/chest: Effort normal and breath sounds normal. No wheezing, rales or rhonchi. Abdominal: Soft, nondistended, nontender.. There are no masses palpable.  Extremities: no edema Lymphadenopathy: No cervical adenopathy noted. Neurological: Alert and oriented to person place and time. No asterixis Skin: Skin is warm and dry. No rashes noted. Psychiatric: Normal mood and affect. Behavior is normal.   ASSESSMENT AND PLAN: 47 year old male here for reassessment of the following issues:  Cirrhosis / esophageal varices / alcoholism / history of hepatitis C - he has decompensated in the past year due to significant alcohol use. Admitted with variceal bleed in January status post band ligation. His follow-up EGD unfortunately had a lot of residual food in the stomach and his procedure was aborted. He is rescheduled for an EGD to be done in the next 2 weeks at [facility] to perform further banding if needed. He'll be given a dose of Reglan before the procedure and will be on clear liquid diet the day before him. Otherwise he is been doing a good job with alcohol abstinence since his admission, and I discussed the seriousness of his liver  disease and need for complete abstinence moving forward to reduce the risk of further decompensation. We'll continue the Coreg for management of his hypertension and varices. He will continue Aldactone for now. Sounds like he may have a very mild hepatic encephalopathy ongoing. In light of his constipation will give lactulose and can titrate that as needed. He'll be due for repeat imaging of his liver in July an should follow up with [place] every 6 months. He agreed.   Anemia - suspect iron deficiency from portal hypertensive gastritis / variceal bleed but  will check CBC and labs today to further evaluate. If iron deficient will add iron supplementation  Constipation / abnormal imaging colon - will proceed with colonoscopy at same time as EGD. Suspect benign change due to portal hypertension but will await result. Lactulose given. He agreed.   Cellar, MD [facility]

## 2019-01-06 NOTE — Patient Instructions (Addendum)
If you are age 47 or older, your body mass index should be between 23-30. Your Body mass index is 33.72 kg/m. If this is out of the aforementioned range listed, please consider follow up with your Primary Care Provider.  If you are age 12 or younger, your body mass index should be between 19-25. Your Body mass index is 33.72 kg/m. If this is out of the aformentioned range listed, please consider follow up with your Primary Care Provider.   You have been scheduled for an endoscopy and colonoscopy. Please follow the written instructions given to you at your visit today. Please pick up your prep supplies at the pharmacy within the next 1-3 days. If you use inhalers (even only as needed), please bring them with you on the day of your procedure. Your physician has requested that you go to www.startemmi.com and enter the access code given to you at your visit today. This web site gives a general overview about your procedure. However, you should still follow specific instructions given to you by our office regarding your preparation for the procedure.  We are giving you a sample of Suprep today to use as prep for your colonoscopy.  Continue your medications.  We have sent the following medications to your pharmacy for you to pick up at your convenience: Lactulose: take 38ml by mouth daily.  Can titrate up to twice a day as needed and tolerated.  Reglan 10mg : Take the evening before your procedure at 5:30pm before you begin drinking your prep.  Please go to the lab in the basement of our building to have lab work done as you leave today. Hit "B" for basement when you get on the elevator.  When the doors open the lab is on your left.  We will call you with the results. Thank you.  Thank you for entrusting me with your care and for choosing [facility], Dr. Goulding Cellar

## 2019-01-07 LAB — CERULOPLASMIN: Ceruloplasmin: 35 mg/dL (ref 18–36)

## 2019-01-07 LAB — ALPHA-1-ANTITRYPSIN: A-1 Antitrypsin, Ser: 212 mg/dL — ABNORMAL HIGH (ref 83–199)

## 2019-01-07 MED FILL — LACTULOSE 10 GM/15 ML SOLN: 10 | 15 days supply | Qty: 450 | Fill #0

## 2019-01-07 MED FILL — METOCLOPRAMIDE 10 MG TABLET: 10 | 1 days supply | Qty: 1 | Fill #0

## 2019-01-12 ENCOUNTER — Encounter: Payer: Self-pay | Admitting: Nurse Practitioner

## 2019-01-12 ENCOUNTER — Other Ambulatory Visit: Payer: Self-pay | Admitting: Nurse Practitioner

## 2019-01-12 DIAGNOSIS — N529 Male erectile dysfunction, unspecified: Secondary | ICD-10-CM

## 2019-01-12 DIAGNOSIS — J449 Chronic obstructive pulmonary disease, unspecified: Secondary | ICD-10-CM

## 2019-01-12 DIAGNOSIS — I10 Essential (primary) hypertension: Secondary | ICD-10-CM

## 2019-01-12 MED FILL — !VENTOLIN HFA INHALER: 108 (90 BAS | 25 days supply | Qty: 18 | Fill #1

## 2019-01-12 MED FILL — ?ALBUTEROL SUL 2.5 MG/3 MLS: (2.5 MG/3ML | 15 days supply | Qty: 180 | Fill #1

## 2019-01-12 MED FILL — ?SPIRONOLACTON 50MG TABLET: 50 | 30 days supply | Qty: 30 | Fill #0

## 2019-01-12 MED FILL — !BREO ELLIPTA 200-25 MCG: 200-25 | 30 days supply | Qty: 60 | Fill #0

## 2019-01-12 MED FILL — ?PANTOPRAZOLE SOD DR 40MG T: 40 | 30 days supply | Qty: 60 | Fill #1

## 2019-01-12 MED FILL — ?CARVEDILOL 3.125 MG TABLET: 3.125 | 30 days supply | Qty: 60 | Fill #0

## 2019-01-14 ENCOUNTER — Telehealth: Payer: Self-pay

## 2019-01-14 NOTE — Telephone Encounter (Signed)
Call placed to patient to confirm that he is requesting a face mask to use with his nebulizer.  Spoke to his friend, Noma ( DPR on file) who explained that the he has no insurance and she obtained the nebulizer for him. He would prefer to use the face mask with the nebulizer.  She is inquiring about the price of the face mask and understands he would need to pay out of pocket.    Call placed to [facility], calls dropped x2 after holding about 10 minutes.  Call placed to [place] [facility] [place] who stated that the face masks would cost about $100. Message left for [facility] [place] to inquire about the cost of the facemasks.

## 2019-01-18 NOTE — Anesthesia Preprocedure Evaluation (Addendum)
Anesthesia Evaluation  Patient identified by MRN, date of birth, ID band Patient awake    Reviewed: Allergy & Precautions, NPO status , Patient's Chart, lab work & pertinent test results  Airway Mallampati: II  TM Distance: >3 FB Neck ROM: Full    Dental no notable dental hx. (+) Teeth Intact,    Pulmonary sleep apnea , COPD, Current Smoker,    Pulmonary exam normal breath sounds clear to auscultation       Cardiovascular hypertension, Pt. on medications Normal cardiovascular exam Rhythm:Regular Rate:Normal     Neuro/Psych PSYCHIATRIC DISORDERS negative neurological ROS     GI/Hepatic (+) Cirrhosis       , Hepatitis -, CHgb 9.9   Endo/Other    Renal/GU      Musculoskeletal   Abdominal   Peds  Hematology  (+) anemia ,   Anesthesia Other Findings   Reproductive/Obstetrics                            Anesthesia Physical Anesthesia Plan  ASA: III  Anesthesia Plan: MAC   Post-op Pain Management:    Induction: Intravenous  PONV Risk Score and Plan: Treatment may vary due to age or medical condition  Airway Management Planned: Natural Airway and Nasal Cannula  Additional Equipment:   Intra-op Plan:   Post-operative Plan:   Informed Consent: I have reviewed the patients History and Physical, chart, labs and discussed the procedure including the risks, benefits and alternatives for the proposed anesthesia with the patient or authorized representative who has indicated his/her understanding and acceptance.     Dental advisory given  Plan Discussed with: CRNA  Anesthesia Plan Comments:        Anesthesia Quick Evaluation

## 2019-01-19 ENCOUNTER — Encounter (HOSPITAL_COMMUNITY): Payer: Self-pay | Admitting: *Deleted

## 2019-01-19 ENCOUNTER — Ambulatory Visit (HOSPITAL_COMMUNITY): Payer: Self-pay | Admitting: Anesthesiology

## 2019-01-19 ENCOUNTER — Ambulatory Visit (HOSPITAL_COMMUNITY)
Admission: RE | Admit: 2019-01-19 | Discharge: 2019-01-19 | Disposition: A | Discharge status: Home / Self Care | Payer: Self-pay | Attending: Gastroenterology | Admitting: Gastroenterology

## 2019-01-19 ENCOUNTER — Encounter (HOSPITAL_COMMUNITY)
Admission: RE | Disposition: A | Discharge status: Home / Self Care | Payer: Self-pay | Source: Home / Self Care | Attending: Gastroenterology

## 2019-01-19 ENCOUNTER — Other Ambulatory Visit: Payer: Self-pay

## 2019-01-19 DIAGNOSIS — D125 Benign neoplasm of sigmoid colon: Secondary | ICD-10-CM

## 2019-01-19 DIAGNOSIS — K7460 Unspecified cirrhosis of liver: Secondary | ICD-10-CM

## 2019-01-19 DIAGNOSIS — R933 Abnormal findings on diagnostic imaging of other parts of digestive tract: Secondary | ICD-10-CM

## 2019-01-19 DIAGNOSIS — D126 Benign neoplasm of colon, unspecified: Secondary | ICD-10-CM

## 2019-01-19 DIAGNOSIS — K648 Other hemorrhoids: Secondary | ICD-10-CM | POA: Insufficient documentation

## 2019-01-19 DIAGNOSIS — K2970 Gastritis, unspecified, without bleeding: Secondary | ICD-10-CM

## 2019-01-19 DIAGNOSIS — K5730 Diverticulosis of large intestine without perforation or abscess without bleeding: Secondary | ICD-10-CM | POA: Insufficient documentation

## 2019-01-19 DIAGNOSIS — K7030 Alcoholic cirrhosis of liver without ascites: Secondary | ICD-10-CM | POA: Insufficient documentation

## 2019-01-19 DIAGNOSIS — F17210 Nicotine dependence, cigarettes, uncomplicated: Secondary | ICD-10-CM | POA: Insufficient documentation

## 2019-01-19 DIAGNOSIS — D124 Benign neoplasm of descending colon: Secondary | ICD-10-CM

## 2019-01-19 DIAGNOSIS — Q438 Other specified congenital malformations of intestine: Secondary | ICD-10-CM | POA: Insufficient documentation

## 2019-01-19 DIAGNOSIS — D120 Benign neoplasm of cecum: Secondary | ICD-10-CM

## 2019-01-19 DIAGNOSIS — G4730 Sleep apnea, unspecified: Secondary | ICD-10-CM | POA: Insufficient documentation

## 2019-01-19 DIAGNOSIS — K766 Portal hypertension: Secondary | ICD-10-CM | POA: Insufficient documentation

## 2019-01-19 DIAGNOSIS — F1021 Alcohol dependence, in remission: Secondary | ICD-10-CM | POA: Insufficient documentation

## 2019-01-19 DIAGNOSIS — K635 Polyp of colon: Secondary | ICD-10-CM | POA: Insufficient documentation

## 2019-01-19 DIAGNOSIS — I8510 Secondary esophageal varices without bleeding: Secondary | ICD-10-CM

## 2019-01-19 DIAGNOSIS — D123 Benign neoplasm of transverse colon: Secondary | ICD-10-CM | POA: Insufficient documentation

## 2019-01-19 DIAGNOSIS — I8500 Esophageal varices without bleeding: Secondary | ICD-10-CM

## 2019-01-19 DIAGNOSIS — K2960 Other gastritis without bleeding: Secondary | ICD-10-CM | POA: Insufficient documentation

## 2019-01-19 DIAGNOSIS — I10 Essential (primary) hypertension: Secondary | ICD-10-CM | POA: Insufficient documentation

## 2019-01-19 DIAGNOSIS — F17200 Nicotine dependence, unspecified, uncomplicated: Secondary | ICD-10-CM | POA: Insufficient documentation

## 2019-01-19 DIAGNOSIS — J449 Chronic obstructive pulmonary disease, unspecified: Secondary | ICD-10-CM | POA: Insufficient documentation

## 2019-01-19 DIAGNOSIS — Z79899 Other long term (current) drug therapy: Secondary | ICD-10-CM | POA: Insufficient documentation

## 2019-01-19 DIAGNOSIS — Z8619 Personal history of other infectious and parasitic diseases: Secondary | ICD-10-CM | POA: Insufficient documentation

## 2019-01-19 HISTORY — PX: COLONOSCOPY WITH PROPOFOL: SHX5780

## 2019-01-19 HISTORY — PX: ESOPHAGEAL BANDING: SHX5518

## 2019-01-19 HISTORY — PX: POLYPECTOMY: SHX5525

## 2019-01-19 HISTORY — PX: ESOPHAGOGASTRODUODENOSCOPY (EGD) WITH PROPOFOL: SHX5813

## 2019-01-19 SURGERY — ESOPHAGOGASTRODUODENOSCOPY (EGD) WITH PROPOFOL
Anesthesia: Monitor Anesthesia Care

## 2019-01-19 MED ORDER — PROPOFOL 10 MG/ML IV BOLUS
INTRAVENOUS | Status: AC
  Filled 2019-01-19: qty 20

## 2019-01-19 MED ORDER — SODIUM CHLORIDE 0.9 % IV SOLN: INTRAVENOUS | Status: DC

## 2019-01-19 MED ORDER — PROPOFOL 500 MG/50ML IV EMUL
INTRAVENOUS | Status: DC | PRN
  Administered 2019-01-19: 150 ug/kg/min via INTRAVENOUS

## 2019-01-19 MED ORDER — PROPOFOL 10 MG/ML IV BOLUS
INTRAVENOUS | Status: AC
  Filled 2019-01-19: qty 60

## 2019-01-19 MED ORDER — LACTATED RINGERS IV SOLN
INTRAVENOUS | Status: DC
  Administered 2019-01-19: 1000 mL via INTRAVENOUS

## 2019-01-19 MED ORDER — PROPOFOL 10 MG/ML IV BOLUS
INTRAVENOUS | Status: DC | PRN
  Administered 2019-01-19 (×5): 20 mg via INTRAVENOUS

## 2019-01-19 SURGICAL SUPPLY — 24 items

## 2019-01-19 NOTE — Transfer of Care (Signed)
Immediate Anesthesia Transfer of Care Note  Patient: Levi Alvarez  Procedure(s) Performed: ESOPHAGOGASTRODUODENOSCOPY (EGD) WITH PROPOFOL (N/A ) COLONOSCOPY WITH PROPOFOL (N/A ) ESOPHAGEAL BANDING POLYPECTOMY  Patient Location: PACU  Anesthesia Type:MAC  Level of Consciousness: sedated  Airway & Oxygen Therapy: Patient Spontanous Breathing and Patient connected to nasal cannula oxygen  Post-op Assessment: Report given to RN and Post -op Vital signs reviewed and stable  Post vital signs: Reviewed and stable  Last Vitals:  Vitals Value Taken Time  BP    Temp    Pulse 58 01/19/2019 10:40 AM  Resp 19 01/19/2019 10:40 AM  SpO2 94 % 01/19/2019 10:40 AM  Vitals shown include unvalidated device data.  Last Pain:  Vitals:   01/19/19 0908  TempSrc: Oral  PainSc: 0-No pain         Complications: No apparent anesthesia complications

## 2019-01-19 NOTE — Op Note (Addendum)
[facility] Patient Name: Levi Alvarez Procedure Date: 01/19/2019 MRN: [id number] Attending MD: Carlota Raspberry. Havery Moros , MD Date of Birth: July 12, 1971 CSN: [id number] Age: 48 Admit Type: Outpatient Procedure:                Colonoscopy Indications:              Abnormal CT of the GI tract - thickening of the                            right and transverse colon, first colonoscopy Providers:                Carlota Raspberry. Havery Moros, MD, Jeanella Cara,                            RN, William Dalton, Technician Referring MD:              Medicines:                Monitored Anesthesia Care Complications:            No immediate complications. Estimated blood loss:                            Minimal. Estimated Blood Loss:     Estimated blood loss was minimal. Procedure:                Pre-Anesthesia Assessment:                           - Prior to the procedure, a History and Physical                            was performed, and patient medications and                            allergies were reviewed. The patient's tolerance of                            previous anesthesia was also reviewed. The risks                            and benefits of the procedure and the sedation                            options and risks were discussed with the patient.                            All questions were answered, and informed consent                            was obtained. Prior Anticoagulants: The patient has                            taken no previous anticoagulant or antiplatelet  agents. ASA Grade Assessment: III - A patient with                            severe systemic disease. After reviewing the risks                            and benefits, the patient was deemed in                            satisfactory condition to undergo the procedure.                           After obtaining informed consent, the colonoscope    was passed under direct vision. Throughout the                            procedure, the patient's blood pressure, pulse, and                            oxygen saturations were monitored continuously. The                            CF-HQ190L ([id number]) Olympus colonoscope was                            introduced through the anus and advanced to the the                            cecum, identified by appendiceal orifice and                            ileocecal valve. The colonoscopy was performed                            without difficulty. The patient tolerated the                            procedure well. The quality of the bowel                            preparation was adequate. The ileocecal valve,                            appendiceal orifice, and rectum were photographed. Scope In: 9:50:25 AM Scope Out: 10:28:15 AM Scope Withdrawal Time: 0 hours 26 minutes 9 seconds  Total Procedure Duration: 0 hours 37 minutes 50 seconds  Findings:      The perianal and digital rectal examinations were normal.      A 8 mm polyp was found in the cecum. The polyp was sessile. The polyp       was removed with a cold snare. Resection and retrieval were complete.      Two sessile polyps were found in the transverse colon. The polyps were 5       to 6 mm in size. These polyps were removed with a  cold snare. Resection       and retrieval were complete.      A 5 mm polyp was found in the descending colon. The polyp was sessile.       The polyp was removed with a cold snare. Resection and retrieval were       complete.      A 5 mm polyp was found in the sigmoid colon. The polyp was sessile. The       polyp was removed with a cold snare. Resection and retrieval were       complete.      Multiple suspected rectal hyperplastic polyps were note. Two diminutive       representative lesions were removed with a cold snare. Resection and       retrieval were complete.      A 8 mm polyp was found in the  rectum. The polyp was semi-pedunculated.       The polyp was removed with a hot snare on Endocut setting. Resection and       retrieval were complete.      Scattered small-mouthed diverticula were found in the transverse colon       and ascending colon.      Internal hemorrhoids were found during retroflexion.      The colon was tortous. There was looping in the right colon which make       cecal intubation challenging. The colon was diffusely edematous, suspect       due to underlying portal hypertension. No focal pathology noted in these       areas otherwise, suspect CT change is due to portal hypertension /       edema. The exam was otherwise without abnormality. Impression:               - One 8 mm polyp in the cecum, removed with a cold                            snare. Resected and retrieved.                           - Two 5 to 6 mm polyps in the transverse colon,                            removed with a cold snare. Resected and retrieved.                           - One 5 mm polyp in the descending colon, removed                            with a cold snare. Resected and retrieved.                           - One 5 mm polyp in the sigmoid colon, removed with                            a cold snare. Resected and retrieved.                           - Mulitple suspected  rectal hyperplastic polyps -                            two representative lesions removed with a cold                            snare. Resected and retrieved.                           - One 8 mm polyp in the rectum, removed with a hot                            snare. Resected and retrieved.                           - Diverticulosis in the transverse colon and in the                            ascending colon.                           - Tortous colon                           - Edema throughout, consistent with change from                            portal hypertension, I suspect the cause of CT                             changes.                           - Internal hemorrhoids.                           - The examination was otherwise normal. Moderate Sedation:      No moderate sedation, case performed with MAC Recommendation:           - Patient has a contact number available for                            emergencies. The signs and symptoms of potential                            delayed complications were discussed with the                            patient. Return to normal activities tomorrow.                            Written discharge instructions were provided to the                            patient.                           -  Resume previous diet.                           - Continue present medications.                           - Await pathology results. Procedure Code(s):        --- Professional ---                           [phone], Colonoscopy, flexible; with removal of                            tumor(s), polyp(s), or other lesion(s) by snare                            technique Diagnosis Code(s):        --- Professional ---                           D12.0, Benign neoplasm of cecum                           D12.4, Benign neoplasm of descending colon                           D12.5, Benign neoplasm of sigmoid colon                           K62.1, Rectal polyp                           D12.3, Benign neoplasm of transverse colon (hepatic                            flexure or splenic flexure)                           K64.8, Other hemorrhoids                           K57.30, Diverticulosis of large intestine without                            perforation or abscess without bleeding                           R93.3, Abnormal findings on diagnostic imaging of                            other parts of digestive tract CPT copyright 2018 American Medical Association. All rights reserved. The codes documented in this report are preliminary and upon coder review may  be revised  to meet current compliance requirements. Remo Lipps P. Havery Moros, MD 01/19/2019 10:38:27 AM This report has been signed electronically. Number of Addenda: 0

## 2019-01-19 NOTE — Op Note (Signed)
Levi Alvarez Patient Name: Levi Alvarez Procedure Date: 01/19/2019 MRN: [id number] Attending MD: Carlota Raspberry. Havery Moros , MD Date of Birth: 1971-01-30 CSN: [id number] Age: 48 Admit Type: Outpatient Procedure:                Upper GI endoscopy Indications:              For therapy of esophageal varices, history of                            variceal bleed in January s/p 6 bands for                            treatment. Last EGD no banding performed due to                            residual food in the stomach. Here for surveillance                            EGD. On [facility] Providers:                Carlota Raspberry. Havery Moros, MD, Jeanella Cara,                            RN, William Dalton, Technician Referring MD:              Medicines:                Monitored Anesthesia Care Complications:            No immediate complications. Estimated blood loss:                            Minimal. Estimated Blood Loss:     Estimated blood loss was minimal. Procedure:                Pre-Anesthesia Assessment:                           - Prior to the procedure, a History and Physical                            was performed, and patient medications and                            allergies were reviewed. The patient's tolerance of                            previous anesthesia was also reviewed. The risks                            and benefits of the procedure and the sedation                            options and risks were discussed with the patient.  All questions were answered, and informed consent                            was obtained. Prior Anticoagulants: The patient has                            taken no previous anticoagulant or antiplatelet                            agents. ASA Grade Assessment: III - A patient with                            severe systemic disease. After reviewing the risks                            and benefits, the  patient was deemed in                            satisfactory condition to undergo the procedure.                           After obtaining informed consent, the endoscope was                            passed under direct vision. Throughout the                            procedure, the patient's blood pressure, pulse, and                            oxygen saturations were monitored continuously. The                            [id number]-H190 ([id number]) Olympus gastroscope was                            introduced through the mouth, and advanced to the                            second part of duodenum. The upper GI endoscopy was                            accomplished without difficulty. The patient                            tolerated the procedure well. Scope In: Scope Out: Findings:      Esophagogastric landmarks were identified: the Z-line was found at 42       cm, the gastroesophageal junction was found at 42 cm and the upper       extent of the gastric folds was found at 42 cm from the incisors.      One column of Grade II varices were found in the lower third of the       esophagus, with scarring / stigmata of prior  banding. They were medium       in size. Three bands were successfully placed resulting in deflation of       the varix.      The exam of the esophagus was otherwise normal.      Diffuse mild inflammation was found in the stomach consistent with mild       portal hypertensive gastritis.      The exam of the stomach was otherwise normal. No gastric varices.      The duodenal bulb and second portion of the duodenum were normal. Impression:               - Esophagogastric landmarks identified.                           - Grade II esophageal varix - overall improved                            since last exam. Banded x 3.                           - Mild portal hypertensive gastritis.                           - Normal duodenal bulb and second portion of the                             duodenum. Moderate Sedation:      No moderate sedation, case performed with MAC Recommendation:           - Patient has a contact number available for                            emergencies. The signs and symptoms of potential                            delayed complications were discussed with the                            patient. Return to normal activities tomorrow.                            Written discharge instructions were provided to the                            patient.                           - Liquid diet today and then advance slowly as                            tolerated.                           - Continue present medications (Coreg, Protonix)                           - Repeat upper endoscopy  in 1 month for further                            surveillance and treatment if needed Procedure Code(s):        --- Professional ---                           [phone], Esophagogastroduodenoscopy, flexible,                            transoral; with band ligation of esophageal/gastric                            varices Diagnosis Code(s):        --- Professional ---                           I85.00, Esophageal varices without bleeding                           K29.70, Gastritis, unspecified, without bleeding CPT copyright 2018 American Medical Association. All rights reserved. The codes documented in this report are preliminary and upon coder review may  be revised to meet current compliance requirements. Remo Lipps P. Armbruster, MD 01/19/2019 10:48:13 AM This report has been signed electronically. Number of Addenda: 0

## 2019-01-19 NOTE — Discharge Instructions (Signed)
Upper Endoscopy, Adult °Upper endoscopy is a procedure to look inside the upper GI (gastrointestinal) tract. The upper GI tract is made up of: °· The part of the body that moves food from your mouth to your stomach (esophagus). °· The stomach. °· The first part of your small intestine (duodenum). °This procedure is also called esophagogastroduodenoscopy (EGD) or gastroscopy. In this procedure, your health care provider passes a thin, flexible tube (endoscope) through your mouth and down your esophagus into your stomach. A small camera is attached to the end of the tube. Images from the camera appear on a monitor in the exam room. During this procedure, your health care provider may also remove a small piece of tissue to be sent to a lab and examined under a microscope (biopsy). °Your health care provider may do an upper endoscopy to diagnose cancers of the upper GI tract. You may also have this procedure to find the cause of other conditions, such as: °· Stomach pain. °· Heartburn. °· Pain or problems when swallowing. °· Nausea and vomiting. °· Stomach bleeding. °· Stomach ulcers. °Tell a health care provider about: °· Any allergies you have. °· All medicines you are taking, including vitamins, herbs, eye drops, creams, and over-the-counter medicines. °· Any problems you or family members have had with anesthetic medicines. °· Any blood disorders you have. °· Any surgeries you have had. °· Any medical conditions you have. °· Whether you are pregnant or may be pregnant. °What are the risks? °Generally, this is a safe procedure. However, problems may occur, including: °· Infection. °· Bleeding. °· Allergic reactions to medicines. °· A tear or hole (perforation) in the esophagus, stomach, or duodenum. °What happens before the procedure? °Staying hydrated °Follow instructions from your health care provider about hydration, which may include: °· Up to 2 hours before the procedure - you may continue to drink clear  liquids, such as water, clear fruit juice, black coffee, and plain tea. ° °Eating and drinking restrictions °Follow instructions from your health care provider about eating and drinking, which may include: °· 8 hours before the procedure - stop eating heavy meals or foods, such as meat, fried foods, or fatty foods. °· 6 hours before the procedure - stop eating light meals or foods, such as toast or cereal. °· 6 hours before the procedure - stop drinking milk or drinks that contain milk. °· 2 hours before the procedure - stop drinking clear liquids. °Medicines °Ask your health care provider about: °· Changing or stopping your regular medicines. This is especially important if you are taking diabetes medicines or blood thinners. °· Taking medicines such as aspirin and ibuprofen. These medicines can thin your blood. Do not take these medicines unless your health care provider tells you to take them. °· Taking over-the-counter medicines, vitamins, herbs, and supplements. °General instructions °· Plan to have someone take you home from the hospital or clinic. °· If you will be going home right after the procedure, plan to have someone with you for 24 hours. °· Ask your health care provider what steps will be taken to help prevent infection. °What happens during the procedure? ° °· An IV will be inserted into one of your veins. °· You may be given one or more of the following: °? A medicine to help you relax (sedative). °? A medicine to numb the throat (local anesthetic). °· You will lie on your left side on an exam table. °· Your health care provider will pass the endoscope through   your mouth and down your esophagus. °· Your health care provider will use the scope to check the inside of your esophagus, stomach, and duodenum. Biopsies may be taken. °· The endoscope will be removed. °The procedure may vary among health care providers and hospitals. °What happens after the procedure? °· Your blood pressure, heart rate,  breathing rate, and blood oxygen level will be monitored until you leave the hospital or clinic. °· Do not drive for 24 hours if you were given a sedative during your procedure. °· When your throat is no longer numb, you may be given some fluids to drink. °· It is up to you to get the results of your procedure. Ask your health care provider, or the department that is doing the procedure, when your results will be ready. °Summary °· Upper endoscopy is a procedure to look inside the upper GI tract. °· During the procedure, an IV will be inserted into one of your veins. You may be given a medicine to help you relax. °· A medicine will be used to numb your throat. °· The endoscope will be passed through your mouth and down your esophagus. °This information is not intended to replace advice given to you by your health care provider. Make sure you discuss any questions you have with your health care provider. °Document Released: 10/26/2000 Document Revised: 03/31/2018 Document Reviewed: 03/31/2018 °Elsevier Interactive Patient Education © 2019 [employer] Inc. ° ° °Colonoscopy, Adult, Care After °This sheet gives you information about how to care for yourself after your procedure. Your doctor may also give you more specific instructions. If you have problems or questions, call your doctor. °What can I expect after the procedure? °After the procedure, it is common to have: °· A small amount of blood in your poop for 24 hours. °· Some gas. °· Mild cramping or bloating in your belly. °Follow these instructions at home: °General instructions °· For the first 24 hours after the procedure: °? Do not drive or use machinery. °? Do not sign important documents. °? Do not drink alcohol. °? Do your daily activities more slowly than normal. °? Eat foods that are soft and easy to digest. °· Take over-the-counter or prescription medicines only as told by your doctor. °To help cramping and bloating: ° °· Try walking around. °· Put heat on  your belly (abdomen) as told by your doctor. Use a heat source that your doctor recommends, such as a moist heat pack or a heating pad. °? Put a towel between your skin and the heat source. °? Leave the heat on for 20-30 minutes. °? Remove the heat if your skin turns bright red. This is especially important if you cannot feel pain, heat, or cold. You can get burned. °Eating and drinking ° °· Drink enough fluid to keep your pee (urine) clear or pale yellow. °· Return to your normal diet as told by your doctor. Avoid heavy or fried foods that are hard to digest. °· Avoid drinking alcohol for as long as told by your doctor. °Contact a doctor if: °· You have blood in your poop (stool) 2-3 days after the procedure. °Get help right away if: °· You have more than a small amount of blood in your poop. °· You see large clumps of tissue (blood clots) in your poop. °· Your belly is swollen. °· You feel sick to your stomach (nauseous). °· You throw up (vomit). °· You have a fever. °· You have belly pain that gets worse, and   medicine does not help your pain. °Summary °· After the procedure, it is common to have a small amount of blood in your poop. You may also have mild cramping and bloating in your belly. °· For the first 24 hours after the procedure, do not drive or use machinery, do not sign important documents, and do not drink alcohol. °· Get help right away if you have a lot of blood in your poop, feel sick to your stomach, have a fever, or have more belly pain. °This information is not intended to replace advice given to you by your health care provider. Make sure you discuss any questions you have with your health care provider. °Document Released: 12/01/2010 Document Revised: 08/29/2017 Document Reviewed: 07/23/2016 °Elsevier Interactive Patient Education © 2019 Elsevier Inc. ° °

## 2019-01-19 NOTE — Anesthesia Postprocedure Evaluation (Signed)
Anesthesia Post Note  Patient: Levi Alvarez  Procedure(s) Performed: ESOPHAGOGASTRODUODENOSCOPY (EGD) WITH PROPOFOL (N/A ) COLONOSCOPY WITH PROPOFOL (N/A ) ESOPHAGEAL BANDING POLYPECTOMY     Patient location during evaluation: Endoscopy Anesthesia Type: MAC Level of consciousness: awake and alert Pain management: pain level controlled Vital Signs Assessment: post-procedure vital signs reviewed and stable Respiratory status: spontaneous breathing, nonlabored ventilation and respiratory function stable Cardiovascular status: stable and blood pressure returned to baseline Postop Assessment: no apparent nausea or vomiting Anesthetic complications: no    Last Vitals:  Vitals:   01/19/19 1100 01/19/19 1110  BP: 133/82 (!) 145/85  Pulse: (!) 53 (!) 53  Resp: 16 17  Temp:    SpO2: 98% 97%    Last Pain:  Vitals:   01/19/19 1110  TempSrc:   PainSc: 0-No pain                 Lynda Rainwater

## 2019-01-20 ENCOUNTER — Encounter (HOSPITAL_COMMUNITY): Payer: Self-pay | Admitting: Gastroenterology

## 2019-01-21 ENCOUNTER — Telehealth: Payer: Self-pay

## 2019-01-21 NOTE — Telephone Encounter (Signed)
-----   Message from Yetta Flock, MD sent at 01/19/2019 10:50 AM EDT ----- Regarding: yet another! Another hospital EGD in 1 month, we will have to look at the list and discuss options at the office.  Richardson Landry

## 2019-01-21 NOTE — Telephone Encounter (Signed)
Called and spoke to [place].  Let her know that April is full at this time but we are working on another day and will keep them posted.  She expressed understanding and would like to request an 8:30 am or later arrival time (10am or greater procedure time)

## 2019-01-28 ENCOUNTER — Ambulatory Visit (HOSPITAL_BASED_OUTPATIENT_CLINIC_OR_DEPARTMENT_OTHER): Payer: Medicaid Other | Attending: Nurse Practitioner

## 2019-01-29 ENCOUNTER — Encounter: Payer: Self-pay | Admitting: Nurse Practitioner

## 2019-02-02 ENCOUNTER — Other Ambulatory Visit: Payer: Self-pay | Admitting: Nurse Practitioner

## 2019-02-02 DIAGNOSIS — F17218 Nicotine dependence, cigarettes, with other nicotine-induced disorders: Secondary | ICD-10-CM

## 2019-02-02 MED FILL — !BREO ELLIPTA 200-25 MCG: 200-25 | 30 days supply | Qty: 60 | Fill #1

## 2019-02-02 MED FILL — ?ALBUTEROL SUL 2.5 MG/3 MLS: (2.5 MG/3ML | 30 days supply | Qty: 360 | Fill #2

## 2019-02-02 MED FILL — LACTULOSE 10 GM/15 ML SOLN: 10 | 15 days supply | Qty: 450 | Fill #1

## 2019-02-02 MED FILL — PANTOPRAZOLE SOD DR 40 MG T: 40 | 30 days supply | Qty: 60 | Fill #2

## 2019-02-02 MED FILL — ?CARVEDILOL 3.125 MG TABLET: 3.125 | 60 days supply | Qty: 120 | Fill #1

## 2019-02-02 MED FILL — ?SPIRONOLACTON 50MG TABLET: 50 | 60 days supply | Qty: 60 | Fill #1

## 2019-02-03 MED FILL — TAMSULOSIN HCL 0.4 MG CAP: 0.4 | 30 days supply | Qty: 30 | Fill #0

## 2019-02-03 MED FILL — TADALAFIL (PAH) 20 MG TABS: 20 | 10 days supply | Qty: 10 | Fill #0

## 2019-02-03 MED FILL — !VENTOLIN HFA INHALER: 108 (90 BAS | 25 days supply | Qty: 18 | Fill #0

## 2019-02-04 ENCOUNTER — Other Ambulatory Visit: Payer: Self-pay | Admitting: Nurse Practitioner

## 2019-02-04 DIAGNOSIS — N529 Male erectile dysfunction, unspecified: Secondary | ICD-10-CM

## 2019-02-04 MED ORDER — TADALAFIL 10 MG PO TABS: 10.0000 mg | ORAL_TABLET | Freq: Every day | ORAL | 2 refills | Status: DC

## 2019-02-06 ENCOUNTER — Other Ambulatory Visit: Payer: Self-pay

## 2019-02-06 ENCOUNTER — Telehealth: Payer: Self-pay

## 2019-02-06 DIAGNOSIS — K7030 Alcoholic cirrhosis of liver without ascites: Secondary | ICD-10-CM

## 2019-02-06 DIAGNOSIS — I8500 Esophageal varices without bleeding: Secondary | ICD-10-CM

## 2019-02-06 NOTE — Telephone Encounter (Signed)
Per Dr. Havery Moros, added pt to April 7th [facility] hospital endo schedule.  Called and confirmed with [name], pt's girlfriend to arrive at 8:30am, NPO after 6:00am. Sent instructions thru [place].

## 2019-02-11 ENCOUNTER — Telehealth: Payer: Self-pay

## 2019-02-11 NOTE — Telephone Encounter (Signed)
Spoke with pt. Let him know we needed to cancel his EGD for 02/17/19 because of COVID-19. That we will reschedule when restrictions have lifted

## 2019-02-11 NOTE — Telephone Encounter (Signed)
Left message for pt. To call back

## 2019-02-17 ENCOUNTER — Encounter (HOSPITAL_COMMUNITY): Admission: RE | Payer: Self-pay | Source: Home / Self Care

## 2019-02-17 ENCOUNTER — Ambulatory Visit (HOSPITAL_COMMUNITY): Admission: RE | Admit: 2019-02-17 | Payer: Medicaid Other | Source: Home / Self Care | Admitting: Gastroenterology

## 2019-02-17 SURGERY — ESOPHAGOGASTRODUODENOSCOPY (EGD) WITH PROPOFOL
Anesthesia: Monitor Anesthesia Care

## 2019-02-25 ENCOUNTER — Ambulatory Visit: Payer: No Typology Code available for payment source | Admitting: Vascular Surgery

## 2019-02-25 ENCOUNTER — Encounter (HOSPITAL_COMMUNITY): Payer: No Typology Code available for payment source

## 2019-02-28 ENCOUNTER — Other Ambulatory Visit: Payer: Self-pay | Admitting: Nurse Practitioner

## 2019-02-28 ENCOUNTER — Other Ambulatory Visit: Payer: Self-pay | Admitting: Family Medicine

## 2019-02-28 DIAGNOSIS — K219 Gastro-esophageal reflux disease without esophagitis: Secondary | ICD-10-CM

## 2019-02-28 DIAGNOSIS — I10 Essential (primary) hypertension: Secondary | ICD-10-CM

## 2019-02-28 MED FILL — TADALAFIL 10 MG TABS: 10 | 30 days supply | Qty: 10 | Fill #0

## 2019-02-28 MED FILL — TAMSULOSIN HCL 0.4 MG CAP: 0.4 | 30 days supply | Qty: 30 | Fill #1

## 2019-03-02 MED FILL — ?PANTOPRAZOLE SOD DR 40MGTA: 40 | 30 days supply | Qty: 60 | Fill #0

## 2019-03-02 MED FILL — ?CARVEDILOL 3.125 MG TABLET: 3.125 | 90 days supply | Qty: 180 | Fill #0

## 2019-03-03 ENCOUNTER — Ambulatory Visit: Payer: Self-pay | Attending: Nurse Practitioner | Admitting: Nurse Practitioner

## 2019-03-03 ENCOUNTER — Other Ambulatory Visit: Payer: Self-pay

## 2019-03-03 ENCOUNTER — Encounter: Payer: Self-pay | Admitting: Nurse Practitioner

## 2019-03-03 DIAGNOSIS — F17200 Nicotine dependence, unspecified, uncomplicated: Secondary | ICD-10-CM

## 2019-03-03 DIAGNOSIS — D649 Anemia, unspecified: Secondary | ICD-10-CM

## 2019-03-03 NOTE — Progress Notes (Signed)
Virtual Visit via Telephone Note  I connected with [name] on 03/03/19  at   2:10 PM EDT  EDT by telephone and verified that I am speaking with the correct person using two identifiers.   Consent I discussed the limitations, risks, security and privacy concerns of performing an evaluation and management service by telephone and the availability of in person appointments. I also discussed with the patient that there may be a patient responsible charge related to this service. The patient expressed understanding and agreed to proceed.   Location of Patient: Private Residence    Location of Provider: [place] participating in Telemedicine visit: Geryl Rankins FNP-BC [place]    History of Present Illness: Telemedicine follow up.  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  A few months ago he was admitted to the hospital for hematemesis and anemia.  EGD 11/25/18 - large esophageal varices, one with red whale sign - banded x 6, severe portal hypertensive gastritis EGD 12/16/18 - grade II varices in the lower esophagus, procedure was aborted due to large volume of food in the stomach, no banding done 109/65.  EGD 01/06/2018 - 2cm HH, no varices, portal hypertensive gastritis Today he states he has not had a drink since January. Doing well. Will need repeat EGD. Continues to smoke. Would like assistance with stopping.   Essential Hypertension Chronic. He is not monitoring his blood pressure at home. Taking carvedilol 3.125mg  BID, Spironolactone 50 mg daily. Denies chest pain, palpitations, lightheadedness, dizziness, headaches.  BP Readings from Last 3 Encounters:  01/19/19 (!) 145/85  01/06/19 120/78  12/16/18 111/61     Observations/Objective: Awake and alert and oriented.     Assessment and Plan: Diagnoses and all orders for this  visit:  Anemia, unspecified type -     CBC -     Iron, TIBC and Ferritin Panel  Tobacco dependence -     nicotine (NICODERM CQ - DOSED IN MG/24 HOURS) 21 mg/24hr patch; Place 1 patch (21 mg total) onto the skin daily. -     nicotine (NICODERM CQ - DOSED IN MG/24 HOURS) 14 mg/24hr patch; Place 1 patch (14 mg total) onto the skin daily for 28 days. -     nicotine (NICODERM CQ - DOSED IN MG/24 HR) 7 mg/24hr patch; Place 1 patch (7 mg total) onto the skin daily for 28 days.   Follow Up Instructions Return in about 3 months (around 06/02/2019).     I discussed the assessment and treatment plan with the patient. The patient was provided an opportunity to ask questions and all were answered. The patient agreed with the plan and demonstrated an understanding of the instructions.   The patient was advised to call back or seek an in-person evaluation if the symptoms worsen or if the condition fails to improve as anticipated.  I provided 20 minutes of non-face-to-face time during this encounter including median intraservice time, reviewing previous notes, labs, imaging, medications and explaining diagnosis and management.  Gildardo Pounds, FNP-BC

## 2019-03-04 ENCOUNTER — Encounter: Payer: Self-pay | Admitting: Nurse Practitioner

## 2019-03-04 ENCOUNTER — Other Ambulatory Visit: Payer: Self-pay | Admitting: Nurse Practitioner

## 2019-03-04 MED ORDER — NICOTINE 14 MG/24HR TD PT24: 14.0000 mg | MEDICATED_PATCH | Freq: Every day | TRANSDERMAL | 0 refills | Status: AC

## 2019-03-04 MED ORDER — NICOTINE 7 MG/24HR TD PT24: 7.0000 mg | MEDICATED_PATCH | Freq: Every day | TRANSDERMAL | 0 refills | Status: AC

## 2019-03-04 MED ORDER — NICOTINE 21 MG/24HR TD PT24: 21.0000 mg | MEDICATED_PATCH | Freq: Every day | TRANSDERMAL | 0 refills | Status: AC

## 2019-03-05 ENCOUNTER — Other Ambulatory Visit: Payer: Self-pay

## 2019-03-05 ENCOUNTER — Other Ambulatory Visit: Payer: Self-pay | Admitting: Nurse Practitioner

## 2019-03-05 ENCOUNTER — Ambulatory Visit: Payer: Self-pay | Attending: Family Medicine

## 2019-03-05 ENCOUNTER — Encounter: Payer: Self-pay | Admitting: Nurse Practitioner

## 2019-03-05 MED FILL — NICOTINE 21 MG/24HR PATCH: 21 | 28 days supply | Qty: 28 | Fill #0

## 2019-03-07 ENCOUNTER — Other Ambulatory Visit: Payer: Self-pay | Admitting: Nurse Practitioner

## 2019-03-07 LAB — CBC
Hematocrit: 43.9 % (ref 37.5–51.0)
Hemoglobin: 13.8 g/dL (ref 13.0–17.7)
MCH: 27.8 pg (ref 26.6–33.0)
MCHC: 31.4 g/dL — ABNORMAL LOW (ref 31.5–35.7)
MCV: 89 fL (ref 79–97)
Platelets: 148 10*3/uL — ABNORMAL LOW (ref 150–450)
RBC: 4.96 x10E6/uL (ref 4.14–5.80)
RDW: 24.3 % — ABNORMAL HIGH (ref 11.6–15.4)
WBC: 8 10*3/uL (ref 3.4–10.8)

## 2019-03-07 LAB — IRON,TIBC AND FERRITIN PANEL
Ferritin: 70 ng/mL (ref 30–400)
Iron Saturation: 18 % (ref 15–55)
Iron: 63 ug/dL (ref 38–169)
Total Iron Binding Capacity: 356 ug/dL (ref 250–450)
UIBC: 293 ug/dL (ref 111–343)

## 2019-03-07 MED ORDER — TADALAFIL 5 MG PO TABS: 5.0000 mg | ORAL_TABLET | Freq: Every day | ORAL | 1 refills | Status: DC

## 2019-03-09 MED FILL — TADALAFIL 5 MG TABS: 5 | 30 days supply | Qty: 30 | Fill #0

## 2019-03-18 ENCOUNTER — Other Ambulatory Visit: Payer: Self-pay

## 2019-03-18 DIAGNOSIS — I8500 Esophageal varices without bleeding: Secondary | ICD-10-CM

## 2019-03-24 MED FILL — ?SPIRONOLACTON 50MG TABL: 50 | 90 days supply | Qty: 90 | Fill #0

## 2019-03-30 ENCOUNTER — Ambulatory Visit (HOSPITAL_COMMUNITY): Admission: RE | Admit: 2019-03-30 | Payer: Medicaid Other | Source: Home / Self Care | Admitting: Gastroenterology

## 2019-03-30 ENCOUNTER — Encounter (HOSPITAL_COMMUNITY): Admission: RE | Payer: Self-pay | Source: Home / Self Care

## 2019-03-30 SURGERY — ESOPHAGOGASTRODUODENOSCOPY (EGD) WITH PROPOFOL
Anesthesia: Monitor Anesthesia Care

## 2019-04-01 ENCOUNTER — Encounter (HOSPITAL_COMMUNITY): Payer: Self-pay

## 2019-04-01 ENCOUNTER — Ambulatory Visit (HOSPITAL_COMMUNITY): Admit: 2019-04-01 | Payer: Medicaid Other | Admitting: Gastroenterology

## 2019-04-01 SURGERY — ESOPHAGOGASTRODUODENOSCOPY (EGD) WITH PROPOFOL
Anesthesia: Monitor Anesthesia Care

## 2019-04-02 ENCOUNTER — Other Ambulatory Visit: Payer: Self-pay | Admitting: Nurse Practitioner

## 2019-04-02 ENCOUNTER — Encounter: Payer: Self-pay | Admitting: Nurse Practitioner

## 2019-04-02 DIAGNOSIS — K219 Gastro-esophageal reflux disease without esophagitis: Secondary | ICD-10-CM

## 2019-04-02 MED FILL — TAMSULOSIN HCL 0.4 MG CAP: 0.4 | 30 days supply | Qty: 30 | Fill #2

## 2019-04-02 MED FILL — ?PANTOPRAZOLE SOD DR 40MGTA: 40 | 30 days supply | Qty: 60 | Fill #1

## 2019-04-02 MED FILL — TADALAFIL 5 MG TABS: 5 | 30 days supply | Qty: 30 | Fill #1

## 2019-04-02 MED FILL — $VENTOLIN HFA 18G INHALER: 108 (90 BAS | 25 days supply | Qty: 18 | Fill #1

## 2019-04-06 MED ORDER — PANTOPRAZOLE SODIUM 40 MG PO TBEC: 40.0000 mg | DELAYED_RELEASE_TABLET | Freq: Two times a day (BID) | ORAL | 2 refills | Status: DC

## 2019-04-15 ENCOUNTER — Other Ambulatory Visit: Payer: Self-pay

## 2019-04-15 ENCOUNTER — Telehealth: Payer: Self-pay

## 2019-04-15 DIAGNOSIS — I8500 Esophageal varices without bleeding: Secondary | ICD-10-CM

## 2019-04-15 NOTE — Telephone Encounter (Signed)
Patient scheduled for EGD w/poss. Banding at [facility] 04/28/19 at 11:45am. Pre-visit scheduled 04/16/19 @ 11:00am COVID testing scheduled 04/24/19, spoke to patient's girlfriend [name]) and gave quarantine restrictions.

## 2019-04-15 NOTE — Telephone Encounter (Signed)
Left message to call back  

## 2019-04-16 ENCOUNTER — Ambulatory Visit: Payer: Self-pay

## 2019-04-16 ENCOUNTER — Other Ambulatory Visit: Payer: Self-pay

## 2019-04-16 VITALS — Ht 71.0 in | Wt 225.0 lb

## 2019-04-16 DIAGNOSIS — I8500 Esophageal varices without bleeding: Secondary | ICD-10-CM

## 2019-04-16 NOTE — Progress Notes (Signed)
Per [name] (girlfriend), no allergies to soy or egg products.Pt not taking any weight loss meds or using  O2 at home. No sedation problems per girlfriend.   Emmi info was refused by girlfriend. The pt has had several procedures!  The PV was done over the phone due to COVID-19.   I verified the pt's address and insurance with the girlfriend. Reviewed prep instructions and medical  history with the girlfriend and will mail paperwork to the pt today.  The pt will call if he has any questions or changes prior to his procedure.

## 2019-04-22 ENCOUNTER — Ambulatory Visit: Payer: Self-pay | Admitting: Urology

## 2019-04-24 ENCOUNTER — Other Ambulatory Visit (HOSPITAL_COMMUNITY)
Admission: RE | Admit: 2019-04-24 | Discharge: 2019-04-24 | Disposition: A | Discharge status: Home / Self Care | Payer: HRSA Program | Source: Ambulatory Visit | Attending: Gastroenterology | Admitting: Gastroenterology

## 2019-04-24 DIAGNOSIS — Z1159 Encounter for screening for other viral diseases: Secondary | ICD-10-CM | POA: Insufficient documentation

## 2019-04-24 DIAGNOSIS — Z01812 Encounter for preprocedural laboratory examination: Secondary | ICD-10-CM | POA: Insufficient documentation

## 2019-04-25 LAB — NOVEL CORONAVIRUS, NAA (HOSP ORDER, SEND-OUT TO REF LAB; TAT 18-24 HRS): SARS-CoV-2, NAA: NOT DETECTED

## 2019-04-27 ENCOUNTER — Other Ambulatory Visit: Payer: Self-pay

## 2019-04-27 ENCOUNTER — Encounter (HOSPITAL_COMMUNITY): Payer: Self-pay

## 2019-04-27 NOTE — Progress Notes (Signed)
SPOKE W/  [name]     SCREENING SYMPTOMS OF COVID 19:   COUGH--NO  RUNNY NOSE--- NO  SORE THROAT---NO  NASAL CONGESTION----NO  SNEEZING----NO  SHORTNESS OF BREATH---NO  DIFFICULTY BREATHING---NO  TEMP >100.0 -----NO  UNEXPLAINED BODY ACHES------NO  CHILLS -------- NO  HEADACHES ---------NO  LOSS OF SMELL/ TASTE --------NO    HAVE YOU OR ANY FAMILY MEMBER TRAVELLED PAST 14 DAYS OUT OF THE   COUNTY---NO STATE----NO COUNTRY----NO  HAVE YOU OR ANY FAMILY MEMBER BEEN EXPOSED TO ANYONE WITH COVID 19?  NO

## 2019-04-28 ENCOUNTER — Other Ambulatory Visit: Payer: Self-pay

## 2019-04-28 ENCOUNTER — Encounter (HOSPITAL_COMMUNITY)
Admission: RE | Disposition: A | Discharge status: Home / Self Care | Payer: Self-pay | Source: Home / Self Care | Attending: Gastroenterology

## 2019-04-28 ENCOUNTER — Ambulatory Visit (HOSPITAL_COMMUNITY)
Admission: RE | Admit: 2019-04-28 | Discharge: 2019-04-28 | Disposition: A | Discharge status: Home / Self Care | Payer: Self-pay | Attending: Gastroenterology | Admitting: Gastroenterology

## 2019-04-28 ENCOUNTER — Encounter (HOSPITAL_COMMUNITY): Payer: Self-pay | Admitting: *Deleted

## 2019-04-28 ENCOUNTER — Ambulatory Visit (HOSPITAL_COMMUNITY): Payer: Self-pay | Admitting: Anesthesiology

## 2019-04-28 DIAGNOSIS — F17210 Nicotine dependence, cigarettes, uncomplicated: Secondary | ICD-10-CM | POA: Insufficient documentation

## 2019-04-28 DIAGNOSIS — Z6833 Body mass index (BMI) 33.0-33.9, adult: Secondary | ICD-10-CM | POA: Insufficient documentation

## 2019-04-28 DIAGNOSIS — Z09 Encounter for follow-up examination after completed treatment for conditions other than malignant neoplasm: Secondary | ICD-10-CM | POA: Insufficient documentation

## 2019-04-28 DIAGNOSIS — K7030 Alcoholic cirrhosis of liver without ascites: Secondary | ICD-10-CM | POA: Insufficient documentation

## 2019-04-28 DIAGNOSIS — I8510 Secondary esophageal varices without bleeding: Secondary | ICD-10-CM

## 2019-04-28 DIAGNOSIS — K219 Gastro-esophageal reflux disease without esophagitis: Secondary | ICD-10-CM | POA: Insufficient documentation

## 2019-04-28 DIAGNOSIS — J449 Chronic obstructive pulmonary disease, unspecified: Secondary | ICD-10-CM | POA: Insufficient documentation

## 2019-04-28 DIAGNOSIS — G4730 Sleep apnea, unspecified: Secondary | ICD-10-CM | POA: Insufficient documentation

## 2019-04-28 DIAGNOSIS — I8500 Esophageal varices without bleeding: Secondary | ICD-10-CM | POA: Insufficient documentation

## 2019-04-28 DIAGNOSIS — I10 Essential (primary) hypertension: Secondary | ICD-10-CM | POA: Insufficient documentation

## 2019-04-28 HISTORY — DX: Personal history of other diseases of the digestive system: Z87.19

## 2019-04-28 HISTORY — DX: Male erectile dysfunction, unspecified: N52.9

## 2019-04-28 HISTORY — DX: Chronic obstructive pulmonary disease, unspecified: J44.9

## 2019-04-28 HISTORY — DX: Gastro-esophageal reflux disease without esophagitis: K21.9

## 2019-04-28 HISTORY — DX: Solitary pulmonary nodule: R91.1

## 2019-04-28 HISTORY — DX: Personal history of diseases of the blood and blood-forming organs and certain disorders involving the immune mechanism: Z86.2

## 2019-04-28 HISTORY — DX: Constipation, unspecified: K59.00

## 2019-04-28 HISTORY — PX: ESOPHAGOGASTRODUODENOSCOPY (EGD) WITH PROPOFOL: SHX5813

## 2019-04-28 HISTORY — DX: Other cervical disc degeneration, unspecified cervical region: M50.30

## 2019-04-28 SURGERY — ESOPHAGOGASTRODUODENOSCOPY (EGD) WITH PROPOFOL
Anesthesia: Monitor Anesthesia Care

## 2019-04-28 MED ORDER — PROPOFOL 500 MG/50ML IV EMUL
INTRAVENOUS | Status: DC | PRN
  Administered 2019-04-28: 20 mg via INTRAVENOUS
  Administered 2019-04-28: 50 mg via INTRAVENOUS

## 2019-04-28 MED ORDER — PROPOFOL 500 MG/50ML IV EMUL
INTRAVENOUS | Status: DC | PRN
  Administered 2019-04-28: 150 ug/kg/min via INTRAVENOUS

## 2019-04-28 MED ORDER — PROPOFOL 10 MG/ML IV BOLUS
INTRAVENOUS | Status: AC
  Filled 2019-04-28: qty 40

## 2019-04-28 MED ORDER — SODIUM CHLORIDE 0.9 % IV SOLN: INTRAVENOUS | Status: DC

## 2019-04-28 MED ORDER — LACTATED RINGERS IV SOLN
INTRAVENOUS | Status: DC
  Administered 2019-04-28: 12:00:00 via INTRAVENOUS

## 2019-04-28 MED ORDER — ONDANSETRON HCL 4 MG/2ML IJ SOLN
INTRAMUSCULAR | Status: DC | PRN
  Administered 2019-04-28: 4 mg via INTRAVENOUS

## 2019-04-28 SURGICAL SUPPLY — 15 items

## 2019-04-28 NOTE — Anesthesia Postprocedure Evaluation (Signed)
Anesthesia Post Note  Patient: Levi Alvarez  Procedure(s) Performed: ESOPHAGOGASTRODUODENOSCOPY (EGD) WITH PROPOFOL (N/A ) POSSIBLE GASTRIC VARICES BANDING (N/A )     Patient location during evaluation: PACU Anesthesia Type: MAC Level of consciousness: awake and alert Pain management: pain level controlled Vital Signs Assessment: post-procedure vital signs reviewed and stable Respiratory status: spontaneous breathing, nonlabored ventilation, respiratory function stable and patient connected to nasal cannula oxygen Cardiovascular status: stable and blood pressure returned to baseline Postop Assessment: no apparent nausea or vomiting Anesthetic complications: no    Last Vitals:  Vitals:   04/28/19 1244 04/28/19 1250  BP: 132/80 135/85  Pulse: 67 (!) 51  Resp: 18 14  Temp: 36.6 C   SpO2: 98% 99%    Last Pain:  Vitals:   04/28/19 1250  TempSrc:   PainSc: 0-No pain                 Johnisha Louks

## 2019-04-28 NOTE — Interval H&P Note (Signed)
History and Physical Interval Note:  04/28/2019 11:25 AM  Levi Alvarez  has presented today for surgery, with the diagnosis of esophageal varices.  The various methods of treatment have been discussed with the patient and family. After consideration of risks, benefits and other options for treatment, the patient has consented to  Procedure(s): ESOPHAGOGASTRODUODENOSCOPY (EGD) WITH PROPOFOL (N/A) POSSIBLE GASTRIC VARICES BANDING (N/A) as a surgical intervention.  The patient's history has been reviewed, patient examined, no change in status, stable for surgery.  I have reviewed the patient's chart and labs.  Questions were answered to the patient's satisfaction.     [place]

## 2019-04-28 NOTE — Discharge Instructions (Signed)
Upper Endoscopy, Adult Upper endoscopy is a procedure to look inside the upper GI (gastrointestinal) tract. The upper GI tract is made up of:  The part of the body that moves food from your mouth to your stomach (esophagus).  The stomach.  The first part of your small intestine (duodenum). This procedure is also called esophagogastroduodenoscopy (EGD) or gastroscopy. In this procedure, your health care provider passes a thin, flexible tube (endoscope) through your mouth and down your esophagus into your stomach. A small camera is attached to the end of the tube. Images from the camera appear on a monitor in the exam room. During this procedure, your health care provider may also remove a small piece of tissue to be sent to a lab and examined under a microscope (biopsy). Your health care provider may do an upper endoscopy to diagnose cancers of the upper GI tract. You may also have this procedure to find the cause of other conditions, such as:  Stomach pain.  Heartburn.  Pain or problems when swallowing.  Nausea and vomiting.  Stomach bleeding.  Stomach ulcers. Tell a health care provider about:  Any allergies you have.  All medicines you are taking, including vitamins, herbs, eye drops, creams, and over-the-counter medicines.  Any problems you or family members have had with anesthetic medicines.  Any blood disorders you have.  Any surgeries you have had.  Any medical conditions you have.  Whether you are pregnant or may be pregnant. What are the risks? Generally, this is a safe procedure. However, problems may occur, including:  Infection.  Bleeding.  Allergic reactions to medicines.  A tear or hole (perforation) in the esophagus, stomach, or duodenum. What happens before the procedure? Staying hydrated Follow instructions from your health care provider about hydration, which may include:  Up to 2 hours before the procedure - you may continue to drink clear  liquids, such as water, clear fruit juice, black coffee, and plain tea.  Eating and drinking restrictions Follow instructions from your health care provider about eating and drinking, which may include:  8 hours before the procedure - stop eating heavy meals or foods, such as meat, fried foods, or fatty foods.  6 hours before the procedure - stop eating light meals or foods, such as toast or cereal.  6 hours before the procedure - stop drinking milk or drinks that contain milk.  2 hours before the procedure - stop drinking clear liquids. Medicines Ask your health care provider about:  Changing or stopping your regular medicines. This is especially important if you are taking diabetes medicines or blood thinners.  Taking medicines such as aspirin and ibuprofen. These medicines can thin your blood. Do not take these medicines unless your health care provider tells you to take them.  Taking over-the-counter medicines, vitamins, herbs, and supplements. General instructions  Plan to have someone take you home from the hospital or clinic.  If you will be going home right after the procedure, plan to have someone with you for 24 hours.  Ask your health care provider what steps will be taken to help prevent infection. What happens during the procedure?   An IV will be inserted into one of your veins.  You may be given one or more of the following: ? A medicine to help you relax (sedative). ? A medicine to numb the throat (local anesthetic).  You will lie on your left side on an exam table.  Your health care provider will pass the endoscope through  your mouth and down your esophagus. °· Your health care provider will use the scope to check the inside of your esophagus, stomach, and duodenum. Biopsies may be taken. °· The endoscope will be removed. °The procedure may vary among health care providers and hospitals. °What happens after the procedure? °· Your blood pressure, heart rate,  breathing rate, and blood oxygen level will be monitored until you leave the hospital or clinic. °· Do not drive for 24 hours if you were given a sedative during your procedure. °· When your throat is no longer numb, you may be given some fluids to drink. °· It is up to you to get the results of your procedure. Ask your health care provider, or the department that is doing the procedure, when your results will be ready. °Summary °· Upper endoscopy is a procedure to look inside the upper GI tract. °· During the procedure, an IV will be inserted into one of your veins. You may be given a medicine to help you relax. °· A medicine will be used to numb your throat. °· The endoscope will be passed through your mouth and down your esophagus. °This information is not intended to replace advice given to you by your health care provider. Make sure you discuss any questions you have with your health care provider. °Document Released: 10/26/2000 Document Revised: 03/31/2018 Document Reviewed: 03/31/2018 °Elsevier Interactive Patient Education © 2019 [employer] ° °

## 2019-04-28 NOTE — H&P (Signed)
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 Had 6 bands placed in January for bleeding, another 3 bands placed in March. Last EGD postponed due to COVID 19, here for surveillance EGD today. He has stopped drinking. He denies any symptoms that bother him.   Past Medical History:  Diagnosis Date  . Alcoholism ([place])   . Blood transfusion without reported diagnosis    jan, 2020 3 units PRBC  . Cirrhosis ([place])    secondary to alcohol and hep C  . Constipation    Resolved  . COPD (chronic obstructive pulmonary disease) ([place])   . DDD (degenerative disc disease), cervical   . ED (erectile dysfunction)   . Esophageal varices ([place])   . GERD (gastroesophageal reflux disease)   . H/O: upper GI bleed   . Hepatitis C antibody positive in blood    resolved  . High blood pressure   . History of anemia   . Left inguinal hernia 2020  . Lung nodule    Small  . Sleep apnea    in process of getting CPAP  . Substance abuse ([place])    was an alcoholic  . Umbilical hernia [id number]     Past Surgical History:  Procedure Laterality Date  . arm surgery     left arm,  . COLONOSCOPY WITH PROPOFOL N/A 01/19/2019   Procedure: COLONOSCOPY WITH PROPOFOL;  Surgeon: Yetta Flock, MD;  Location: [facility];  Service: Gastroenterology;  Laterality: N/A;  . ESOPHAGEAL BANDING  11/25/2018   Procedure: ESOPHAGEAL BANDING;  Surgeon: Milus Banister, MD;  Location: [facility];  Service: Endoscopy;;  . ESOPHAGEAL BANDING  01/19/2019   Procedure: ESOPHAGEAL BANDING;  Surgeon: Yetta Flock, MD;  Location: [facility];  Service: Gastroenterology;;  . ESOPHAGOGASTRODUODENOSCOPY (EGD) WITH PROPOFOL N/A 11/25/2018   Procedure: ESOPHAGOGASTRODUODENOSCOPY (EGD) WITH PROPOFOL;  Surgeon: Milus Banister, MD;  Location: [facility];  Service: Endoscopy;  Laterality: N/A;  . ESOPHAGOGASTRODUODENOSCOPY (EGD) WITH PROPOFOL N/A 01/19/2019   Procedure: ESOPHAGOGASTRODUODENOSCOPY  (EGD) WITH PROPOFOL;  Surgeon: Yetta Flock, MD;  Location: [facility];  Service: Gastroenterology;  Laterality: N/A;  . KNEE SURGERY Left   . POLYPECTOMY  01/19/2019   Procedure: POLYPECTOMY;  Surgeon: Yetta Flock, MD;  Location: [facility];  Service: Gastroenterology;;   Family History  Problem Relation Age of Onset  . Colitis Mother   . Arthritis Mother   . Cancer Father        lymphoma?  . Cancer Paternal Grandfather        bone  . Heart disease Neg Hx   . Stroke Neg Hx   . Diabetes Neg Hx    Social History   Tobacco Use  . Smoking status: Current Every Day Smoker    Packs/day: 1.00    Years: 30.00    Pack years: 30.00    Types: Cigarettes  . Smokeless tobacco: Former Systems developer    Types: Chew  Substance Use Topics  . Alcohol use: Not Currently    Frequency: Never    Comment: stopped in 11/2018  . Drug use: No   No current facility-administered medications for this encounter.    Allergies  Allergen Reactions  . Naproxen Hives and Swelling     Review of Systems: All systems reviewed and negative except where noted in HPI.   Lab Results  Component Value Date   WBC 8.0 03/05/2019   HGB 13.8 03/05/2019   HCT 43.9 03/05/2019  MCV 89 03/05/2019   PLT 148 (L) 03/05/2019    Lab Results  Component Value Date   CREATININE 0.76 01/06/2019   BUN 9 01/06/2019   NA 132 (L) 01/06/2019   K 4.2 01/06/2019   CL 98 01/06/2019   CO2 26 01/06/2019    Lab Results  Component Value Date   ALT 29 01/06/2019   AST 37 01/06/2019   ALKPHOS 96 01/06/2019   BILITOT 0.7 01/06/2019     Physical Exam: BP 132/60   Pulse (!) 55   Temp 99.6 F (37.6 C) (Temporal)   Resp 15   Ht 5\' 10"  (1.778 m)   Wt 104.3 kg   SpO2 99%   BMI 33.00 kg/m  Constitutional: Pleasant,well-developed, male in no acute distress. Cardiovascular: Normal rate, regular rhythm.  Pulmonary/chest: Effort normal and breath sounds normal. No wheezing, rales or rhonchi. Abdominal:  Soft, nondistended, nontender.  There are no masses palpable. No hepatomegaly. Extremities: no edema   ASSESSMENT AND PLAN: 48 y/o male with cirrhosis and history of bleeding varices, here for surveillance EGD with banding if needed. I have discussed risks / benefits and he wishes to proceed. Further recommendations pending the results.  Dixon Cellar, MD [facility]

## 2019-04-28 NOTE — Anesthesia Preprocedure Evaluation (Addendum)
Anesthesia Evaluation  Patient identified by MRN, date of birth, ID band Patient awake    Reviewed: Allergy & Precautions, NPO status , Patient's Chart, lab work & pertinent test results  Airway Mallampati: II  TM Distance: >3 FB Neck ROM: Full    Dental no notable dental hx. (+) Teeth Intact,    Pulmonary sleep apnea , COPD, Current Smoker,    Pulmonary exam normal breath sounds clear to auscultation       Cardiovascular hypertension, Pt. on medications Normal cardiovascular exam Rhythm:Regular Rate:Normal     Neuro/Psych PSYCHIATRIC DISORDERS negative neurological ROS     GI/Hepatic GERD  Medicated,(+) Cirrhosis     substance abuse  , Hepatitis -, CHgb 9.9   Endo/Other  Morbid obesity  Renal/GU      Musculoskeletal   Abdominal   Peds  Hematology  (+) Blood dyscrasia, anemia ,   Anesthesia Other Findings   Reproductive/Obstetrics                             Anesthesia Physical  Anesthesia Plan  ASA: III  Anesthesia Plan: MAC   Post-op Pain Management:    Induction: Intravenous  PONV Risk Score and Plan: Treatment may vary due to age or medical condition  Airway Management Planned: Natural Airway and Nasal Cannula  Additional Equipment:   Intra-op Plan:   Post-operative Plan:   Informed Consent: I have reviewed the patients History and Physical, chart, labs and discussed the procedure including the risks, benefits and alternatives for the proposed anesthesia with the patient or authorized representative who has indicated his/her understanding and acceptance.     Dental advisory given  Plan Discussed with: CRNA  Anesthesia Plan Comments:         Anesthesia Quick Evaluation

## 2019-04-28 NOTE — Transfer of Care (Signed)
Immediate Anesthesia Transfer of Care Note  Patient: Levi Alvarez  Procedure(s) Performed: ESOPHAGOGASTRODUODENOSCOPY (EGD) WITH PROPOFOL (N/A ) POSSIBLE GASTRIC VARICES BANDING (N/A )  Patient Location: PACU  Anesthesia Type:MAC  Level of Consciousness: awake, alert  and oriented  Airway & Oxygen Therapy: Patient Spontanous Breathing and Patient connected to nasal cannula oxygen  Post-op Assessment: Report given to RN and Post -op Vital signs reviewed and stable  Post vital signs: Reviewed and stable  Last Vitals:  Vitals Value Taken Time  BP    Temp    Pulse    Resp    SpO2      Last Pain:  Vitals:   04/28/19 1113  TempSrc: Temporal  PainSc: 0-No pain         Complications: No apparent anesthesia complications

## 2019-04-28 NOTE — Op Note (Addendum)
[facility] Patient Name: Levi Alvarez Procedure Date: 04/28/2019 MRN: [id number] Attending MD: Carlota Raspberry. Havery Moros , MD Date of Birth: July 26, 1971 CSN: [id number] Age: 48 Admit Type: Inpatient Procedure:                Upper GI endoscopy Indications:              Follow-up of esophageal varices, s/p banding in                            January for bleeding varices, and again in March on                            surveillanc eexam, on Coreg Providers:                Remo Lipps P. Havery Moros, MD, Cleda Daub, RN, Cherylynn Ridges, Technician, Rosario Adie, CRNA Referring MD:              Medicines:                Monitored Anesthesia Care Complications:            No immediate complications. Estimated blood loss:                            None. Estimated Blood Loss:     Estimated blood loss: none. Procedure:                Pre-Anesthesia Assessment:                           - Prior to the procedure, a History and Physical                            was performed, and patient medications and                            allergies were reviewed. The patient's tolerance of                            previous anesthesia was also reviewed. The risks                            and benefits of the procedure and the sedation                            options and risks were discussed with the patient.                            All questions were answered, and informed consent                            was obtained. Prior Anticoagulants: The patient has  taken no previous anticoagulant or antiplatelet                            agents. ASA Grade Assessment: III - A patient with                            severe systemic disease. After reviewing the risks                            and benefits, the patient was deemed in                            satisfactory condition to undergo the procedure.  After obtaining informed consent, the endoscope was                            passed under direct vision. Throughout the                            procedure, the patient's blood pressure, pulse, and                            oxygen saturations were monitored continuously. The                            GIF-H190 ([id number] Olympus gastroscope was                            introduced through the mouth, and advanced to the                            second part of duodenum. The upper GI endoscopy was                            accomplished without difficulty. The patient                            tolerated the procedure well. Scope In: Scope Out: Findings:      Esophagogastric landmarks were identified: the Z-line was found at 42       cm, the gastroesophageal junction was found at 42 cm and the upper       extent of the gastric folds was found at 42 cm from the incisors.      Small varices were found in the middle third of the esophagus and in the       lower third of the esophagus which flattened with insufflation. Stigmata       of prior banding with scarring was noted in the mid to distal esophagus.       A protuberant area of tissue was noted proximal to the GEJ, I suspect       related to scarring from prior banding.      The exam of the esophagus was otherwise normal.      There was adeherent mucous in the fundus. The entire examined stomach       was otherwise normal.  A prominent fold was noted in the duodenal bulb. The duodenal bulb and       second portion of the duodenum were otherwise normal. Impression:               - Esophagogastric landmarks identified.                           - Small esophageal varices that easily flattened                            with insufflation and stigmata of prior banding                            noted. No further banding performed today.                           - Normal stomach.                           - Normal duodenal bulb and  second portion of the                            duodenum. Moderate Sedation:      No moderate sedation, case performed with MAC Recommendation:           - Patient has a contact number available for                            emergencies. The signs and symptoms of potential                            delayed complications were discussed with the                            patient. Return to normal activities tomorrow.                            Written discharge instructions were provided to the                            patient.                           - Resume previous diet.                           - Continue present medications (Coreg)                           - Repeat upper endoscopy in 3-6 months for                            surveillance.                           - Follow up in the clinic in August for reassessment Procedure Code(s):        ---  Professional ---                           [phone], Esophagogastroduodenoscopy, flexible,                            transoral; diagnostic, including collection of                            specimen(s) by brushing or washing, when performed                            (separate procedure) Diagnosis Code(s):        --- Professional ---                           I85.00, Esophageal varices without bleeding CPT copyright 2019 American [employer] Association. All rights reserved. The codes documented in this report are preliminary and upon coder review may  be revised to meet current compliance requirements. Remo Lipps P. Deslyn Cavenaugh, MD 04/28/2019 12:42:43 PM This report has been signed electronically. Number of Addenda: 0

## 2019-05-04 ENCOUNTER — Other Ambulatory Visit: Payer: Self-pay | Admitting: Nurse Practitioner

## 2019-05-04 MED FILL — ?PANTOPRAZOLE SODI DR 40MGT: 40 | 30 days supply | Qty: 60 | Fill #2

## 2019-05-04 MED FILL — TAMSULOSIN HCL 0.4 MG CAP: 0.4 | 30 days supply | Qty: 30 | Fill #3

## 2019-05-04 MED FILL — TADALAFIL 5 MG TABS: 5 | 30 days supply | Qty: 30 | Fill #0

## 2019-06-04 ENCOUNTER — Ambulatory Visit: Payer: Self-pay | Admitting: Urology

## 2019-06-08 ENCOUNTER — Other Ambulatory Visit: Payer: Self-pay | Admitting: Nurse Practitioner

## 2019-06-08 MED FILL — TAMSULOSIN HCL 0.4 MG CAP: 0.4 | 30 days supply | Qty: 30 | Fill #4

## 2019-06-08 MED FILL — ALBUTEROL SUL 2.5 MG/3 ML S: (2.5 MG/3ML | 25 days supply | Qty: 300 | Fill #3

## 2019-06-08 MED FILL — PANTOPRAZOLE SOD DR 40 MG T: 40 | 30 days supply | Qty: 60 | Fill #0

## 2019-06-08 MED FILL — TADALAFIL 5 MG TABS: 5 | 30 days supply | Qty: 30 | Fill #1

## 2019-06-09 MED FILL — ?SPIRONOLACTON 50MG TABL: 50 | 30 days supply | Qty: 30 | Fill #0

## 2019-07-06 ENCOUNTER — Other Ambulatory Visit: Payer: Self-pay | Admitting: Nurse Practitioner

## 2019-07-06 MED FILL — TAMSULOSIN HCL 0.4 MG CAP: 0.4 | 30 days supply | Qty: 30 | Fill #5

## 2019-07-06 MED FILL — PANTOPRAZOLE SOD DR 40 MG T: 40 | 30 days supply | Qty: 60 | Fill #1

## 2019-07-06 MED FILL — CARVEDILOL 3.125 MG TABLET: 3.125 | 30 days supply | Qty: 60 | Fill #0

## 2019-07-07 MED FILL — ?SPIRONOLACTON 50MG TABL: 50 | 30 days supply | Qty: 30 | Fill #0

## 2019-07-07 MED FILL — TADALAFIL 5 MG TABS: 5 | 30 days supply | Qty: 30 | Fill #0

## 2019-08-10 ENCOUNTER — Other Ambulatory Visit: Payer: Self-pay | Admitting: Family Medicine

## 2019-08-10 MED FILL — TAMSULOSIN HCL 0.4 MG CAP: 0.4 | 30 days supply | Qty: 30 | Fill #6

## 2019-08-10 MED FILL — PANTOPRAZOLE SOD DR 40 MG T: 40 | 30 days supply | Qty: 60 | Fill #2

## 2019-08-10 MED FILL — CARVEDILOL 3.125 MG TABLET: 3.125 | 30 days supply | Qty: 60 | Fill #1

## 2019-08-13 ENCOUNTER — Other Ambulatory Visit: Payer: Self-pay | Admitting: Family Medicine

## 2019-08-14 ENCOUNTER — Ambulatory Visit: Payer: Medicaid Other | Admitting: Gastroenterology

## 2019-08-17 ENCOUNTER — Encounter: Payer: Self-pay | Admitting: Nurse Practitioner

## 2019-08-17 ENCOUNTER — Other Ambulatory Visit: Payer: Self-pay

## 2019-08-17 ENCOUNTER — Ambulatory Visit: Payer: Self-pay | Attending: Nurse Practitioner | Admitting: Nurse Practitioner

## 2019-08-17 DIAGNOSIS — N529 Male erectile dysfunction, unspecified: Secondary | ICD-10-CM

## 2019-08-17 DIAGNOSIS — J449 Chronic obstructive pulmonary disease, unspecified: Secondary | ICD-10-CM

## 2019-08-17 DIAGNOSIS — I10 Essential (primary) hypertension: Secondary | ICD-10-CM

## 2019-08-17 MED ORDER — SPIRONOLACTONE 50 MG PO TABS: 50.0000 mg | ORAL_TABLET | Freq: Every day | ORAL | 1 refills | Status: DC

## 2019-08-17 MED ORDER — TADALAFIL 5 MG PO TABS: 5.0000 mg | ORAL_TABLET | Freq: Every day | ORAL | 0 refills | Status: DC

## 2019-08-17 MED ORDER — SPIRONOLACTONE 50 MG PO TABS: 50.0000 mg | ORAL_TABLET | Freq: Every day | ORAL | 0 refills | Status: DC

## 2019-08-17 MED ORDER — CARVEDILOL 3.125 MG PO TABS: 3.1250 mg | ORAL_TABLET | Freq: Two times a day (BID) | ORAL | 3 refills | Status: DC

## 2019-08-17 MED ORDER — BREO ELLIPTA 200-25 MCG/INH IN AEPB: 1.0000 | INHALATION_SPRAY | Freq: Every day | RESPIRATORY_TRACT | 1 refills | Status: DC

## 2019-08-17 MED FILL — TADALAFIL 5 MG TABS: 5 | 30 days supply | Qty: 30 | Fill #0

## 2019-08-17 MED FILL — ?SPIRONOLACTON 50MG TABL: 50 | 30 days supply | Qty: 30 | Fill #0

## 2019-08-17 MED FILL — $BREO ELLIPTA 200-25 MCG IN: 200-25 | 90 days supply | Qty: 180 | Fill #0

## 2019-08-17 NOTE — Progress Notes (Signed)
Virtual Visit via Telephone Note Due to national recommendations of social distancing due to [place] 19, telehealth visit is felt to be most appropriate for this patient at this time.  I discussed the limitations, risks, security and privacy concerns of performing an evaluation and management service by telephone and the availability of in person appointments. I also discussed with the patient that there may be a patient responsible charge related to this service. The patient expressed understanding and agreed to proceed.    I connected with Levi Alvarez on 08/17/19  at   3:10 PM EDT  EDT by telephone and verified that I am speaking with the correct person using two identifiers.   Consent I discussed the limitations, risks, security and privacy concerns of performing an evaluation and management service by telephone and the availability of in person appointments. I also discussed with the patient that there may be a patient responsible charge related to this service. The patient expressed understanding and agreed to proceed.   Location of Patient: [facility] planning/management officer of Provider: [place] [facility] [place] participating in Telemedicine visit: Geryl Rankins FNP-BC [place]    History of Present Illness: Telemedicine visit for: Essential Hypertension   Essential Hypertension He is monitoring his blood pressure at home however currently does not have home readings available and can not recall last reading. States he is not at home right now with his BP log. Current medications include carvedilol 3.125 mg BID, spironolactone 50 mg daily. Denies chest pain, palpitations, lightheadedness, dizziness, headaches or BLE edema.  BP Readings from Last 3 Encounters:  04/28/19 (!) 141/86  01/19/19 (!) 145/85  01/06/19 120/78     Past Medical History:  Diagnosis Date  . Alcoholism ([place])   . Blood transfusion without reported diagnosis    jan,  2020 3 units PRBC  . 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 Constipation    Resolved  . COPD (chronic obstructive pulmonary disease) ([place])   . DDD (degenerative disc disease), cervical   . ED (erectile dysfunction)   . Esophageal varices ([place])   . GERD (gastroesophageal reflux disease)   . H/O: upper GI bleed   . Hepatitis C antibody positive in blood    resolved  . High blood pressure   . History of anemia   . Left inguinal hernia 2020  . Lung nodule    Small  . Sleep apnea    in process of getting CPAP  . Substance abuse ([place])    was an alcoholic  . Umbilical hernia [id number]    Past Surgical History:  Procedure Laterality Date  . arm surgery     left arm,  . COLONOSCOPY WITH PROPOFOL N/A 01/19/2019   Procedure: COLONOSCOPY WITH PROPOFOL;  Surgeon: Yetta Flock, MD;  Location: [facility];  Service: Gastroenterology;  Laterality: N/A;  . ESOPHAGEAL BANDING  11/25/2018   Procedure: ESOPHAGEAL BANDING;  Surgeon: Milus Banister, MD;  [facility]: [facility];  Service: Endoscopy;;  . ESOPHAGEAL BANDING  01/19/2019   Procedure: ESOPHAGEAL BANDING;  Surgeon: Yetta Flock, MD;  [facility];  Service: Gastroenterology;;  . ESOPHAGOGASTRODUODENOSCOPY (EGD) WITH PROPOFOL N/A 11/25/2018   Procedure: ESOPHAGOGASTRODUODENOSCOPY (EGD) WITH PROPOFOL;  Surgeon: Milus Banister, MD;  [facility];  Service: Endoscopy;  Laterality: N/A;  . ESOPHAGOGASTRODUODENOSCOPY (EGD) WITH PROPOFOL N/A 01/19/2019   Procedure: ESOPHAGOGASTRODUODENOSCOPY (EGD) WITH PROPOFOL;  Surgeon: Havery Moros,  Carlota Raspberry, MD;  Location: [facility];  Service: Gastroenterology;  Laterality: N/A;  . ESOPHAGOGASTRODUODENOSCOPY (EGD) WITH PROPOFOL N/A 04/28/2019   Procedure: ESOPHAGOGASTRODUODENOSCOPY (EGD) WITH PROPOFOL;  Surgeon: Yetta Flock, MD;  [facility];  Service: Gastroenterology;  Laterality: N/A;  . KNEE SURGERY Left   . POLYPECTOMY  01/19/2019    Procedure: POLYPECTOMY;  Surgeon: Yetta Flock, MD;  Location: [facility];  Service: Gastroenterology;;    Family History  Problem Relation Age of Onset  . Colitis Mother   . Arthritis Mother   . Cancer Father        lymphoma?  . Cancer Paternal Grandfather        bone  . Heart disease Neg Hx   . Stroke Neg Hx   . Diabetes Neg Hx     Social History   Socioeconomic History  . Marital status: Divorced    Spouse name: Not on file  . Number of children: Not on file  . Years of education: Not on file  . Highest education level: Not on file  Occupational History  . Not on file  Social Needs  . Financial resource strain: Not on file  . Food insecurity    Worry: Not on file    Inability: Not on file  . Transportation needs    Medical: Not on file    Non-medical: Not on file  Tobacco Use  . Smoking status: Current Every Day Smoker    Packs/day: 1.00    Years: 30.00    Pack years: 30.00    Types: Cigarettes  . Smokeless tobacco: Former Systems developer    Types: Chew  Substance and Sexual Activity  . Alcohol use: Not Currently    Frequency: Never    Comment: stopped in 11/2018  . Drug use: No  . Sexual activity: Yes    Partners: Female  Lifestyle  . Physical activity    Days per week: Not on file    Minutes per session: Not on file  . Stress: Not on file  Relationships  . Social Herbalist on phone: Not on file    Gets together: Not on file    Attends religious service: Not on file    Active member of club or organization: Not on file    Attends meetings of clubs or organizations: Not on file    Relationship status: Not on file  Other Topics Concern  . Not on file  Social History Narrative  . Not on file     Observations/Objective: Awake, alert and oriented x 3   Review of Systems  Constitutional: Negative for fever, malaise/fatigue and weight loss.  HENT: Negative.  Negative for nosebleeds.   Eyes: Negative.  Negative for blurred vision, double  vision and photophobia.  Respiratory: Negative.  Negative for cough and shortness of breath.   Cardiovascular: Negative.  Negative for chest pain, palpitations and leg swelling.  Gastrointestinal: Negative.  Negative for heartburn, nausea and vomiting.  Genitourinary:       ED  Musculoskeletal: Negative.  Negative for myalgias.  Neurological: Negative.  Negative for dizziness, focal weakness, seizures and headaches.  Psychiatric/Behavioral: Negative.  Negative for suicidal ideas.    Assessment and Plan: Diagnoses and all orders for this visit:  Essential hypertension -     carvedilol (COREG) 3.125 MG tablet; Take 1 tablet (3.125 mg total) by mouth 2 (two) times daily with a meal. TAKE 1 TABLET (3.125 MG TOTAL) BY MOUTH 2 (TWO) TIMES  DAILY. -     spironolactone (ALDACTONE) 50 MG tablet; Take 1 tablet (50 mg total) by mouth daily. Continue all antihypertensives as prescribed.  Remember to bring in your blood pressure log with you for your follow up appointment.  DASH/Mediterranean Diets are healthier choices for HTN.    Chronic obstructive pulmonary disease, unspecified COPD type (HCC) -     fluticasone furoate-vilanterol (BREO ELLIPTA) 200-25 MCG/INH AEPB; Inhale 1 puff into the lungs daily.  Erectile dysfunction, unspecified erectile dysfunction type -     tadalafil (CIALIS) 5 MG tablet; Take 1 tablet (5 mg total) by mouth daily. Must have office visit for refills     Follow Up Instructions Return in about 6 weeks (around 09/25/2019) for BP recheck.     I discussed the assessment and treatment plan with the patient. The patient was provided an opportunity to ask questions and all were answered. The patient agreed with the plan and demonstrated an understanding of the instructions.   The patient was advised to call back or seek an in-person evaluation if the symptoms worsen or if the condition fails to improve as anticipated.  I provided 17 minutes of non-face-to-face time during  this encounter including median intraservice time, reviewing previous notes, labs, imaging, medications and explaining diagnosis and management.  Gildardo Pounds, FNP-BC

## 2019-08-19 ENCOUNTER — Encounter: Payer: Self-pay | Admitting: Nurse Practitioner

## 2019-09-15 ENCOUNTER — Other Ambulatory Visit: Payer: Self-pay | Admitting: Nurse Practitioner

## 2019-09-15 DIAGNOSIS — N529 Male erectile dysfunction, unspecified: Secondary | ICD-10-CM

## 2019-09-15 DIAGNOSIS — K219 Gastro-esophageal reflux disease without esophagitis: Secondary | ICD-10-CM

## 2019-09-15 MED FILL — PANTOPRAZOLE SOD DR 40 MG T: 40 | 30 days supply | Qty: 60 | Fill #0

## 2019-09-15 MED FILL — ?SPIRONOLACTON 50MG TABLET: 50 | 30 days supply | Qty: 30 | Fill #0

## 2019-09-15 MED FILL — ?CARVEDILOL 3.125 MG TABLET: 3.125 | 30 days supply | Qty: 60 | Fill #2

## 2019-09-15 MED FILL — TAMSULOSIN HCL 0.4 MG CAP: 0.4 | 30 days supply | Qty: 30 | Fill #7

## 2019-09-15 MED FILL — TADALAFIL 5 MG TABS: 5 | 30 days supply | Qty: 30 | Fill #0

## 2019-09-24 ENCOUNTER — Telehealth: Payer: Self-pay

## 2019-09-24 NOTE — Telephone Encounter (Signed)
Called patient to do their pre-visit COVID screening.  Call went to voicemail. Unable to do prescreening.  

## 2019-09-25 ENCOUNTER — Encounter: Payer: Self-pay | Admitting: Gastroenterology

## 2019-09-25 ENCOUNTER — Ambulatory Visit: Payer: Self-pay | Attending: Nurse Practitioner | Admitting: Nurse Practitioner

## 2019-09-25 ENCOUNTER — Other Ambulatory Visit (INDEPENDENT_AMBULATORY_CARE_PROVIDER_SITE_OTHER): Payer: Self-pay

## 2019-09-25 ENCOUNTER — Other Ambulatory Visit: Payer: Self-pay

## 2019-09-25 ENCOUNTER — Ambulatory Visit (INDEPENDENT_AMBULATORY_CARE_PROVIDER_SITE_OTHER): Payer: Self-pay | Admitting: Gastroenterology

## 2019-09-25 ENCOUNTER — Encounter: Payer: Self-pay | Admitting: Nurse Practitioner

## 2019-09-25 VITALS — BP 121/73 | HR 52 | Temp 98.2°F | Ht 70.0 in | Wt 235.0 lb

## 2019-09-25 VITALS — BP 110/72 | HR 55 | Temp 97.8°F | Ht 70.0 in | Wt 235.0 lb

## 2019-09-25 DIAGNOSIS — Z8619 Personal history of other infectious and parasitic diseases: Secondary | ICD-10-CM

## 2019-09-25 DIAGNOSIS — F419 Anxiety disorder, unspecified: Secondary | ICD-10-CM

## 2019-09-25 DIAGNOSIS — I10 Essential (primary) hypertension: Secondary | ICD-10-CM

## 2019-09-25 DIAGNOSIS — F329 Major depressive disorder, single episode, unspecified: Secondary | ICD-10-CM

## 2019-09-25 DIAGNOSIS — K7030 Alcoholic cirrhosis of liver without ascites: Secondary | ICD-10-CM

## 2019-09-25 DIAGNOSIS — F17200 Nicotine dependence, unspecified, uncomplicated: Secondary | ICD-10-CM

## 2019-09-25 DIAGNOSIS — J449 Chronic obstructive pulmonary disease, unspecified: Secondary | ICD-10-CM

## 2019-09-25 DIAGNOSIS — K219 Gastro-esophageal reflux disease without esophagitis: Secondary | ICD-10-CM

## 2019-09-25 DIAGNOSIS — I8501 Esophageal varices with bleeding: Secondary | ICD-10-CM

## 2019-09-25 LAB — CBC WITH DIFFERENTIAL/PLATELET
Basophils Absolute: 0.1 10*3/uL (ref 0.0–0.1)
Basophils Relative: 1.1 % (ref 0.0–3.0)
Eosinophils Absolute: 0.2 10*3/uL (ref 0.0–0.7)
Eosinophils Relative: 3.1 % (ref 0.0–5.0)
HCT: 42.9 % (ref 39.0–52.0)
Hemoglobin: 14.5 g/dL (ref 13.0–17.0)
Lymphocytes Relative: 25.3 % (ref 12.0–46.0)
Lymphs Abs: 1.7 10*3/uL (ref 0.7–4.0)
MCHC: 33.8 g/dL (ref 30.0–36.0)
MCV: 99.1 fl (ref 78.0–100.0)
Monocytes Absolute: 0.9 10*3/uL (ref 0.1–1.0)
Monocytes Relative: 13.1 % — ABNORMAL HIGH (ref 3.0–12.0)
Neutro Abs: 3.9 10*3/uL (ref 1.4–7.7)
Neutrophils Relative %: 57.4 % (ref 43.0–77.0)
Platelets: 145 10*3/uL — ABNORMAL LOW (ref 150.0–400.0)
RBC: 4.33 Mil/uL (ref 4.22–5.81)
RDW: 15 % (ref 11.5–15.5)
WBC: 6.9 10*3/uL (ref 4.0–10.5)

## 2019-09-25 LAB — COMPREHENSIVE METABOLIC PANEL
ALT: 29 U/L (ref 0–53)
AST: 32 U/L (ref 0–37)
Albumin: 4 g/dL (ref 3.5–5.2)
Alkaline Phosphatase: 80 U/L (ref 39–117)
BUN: 10 mg/dL (ref 6–23)
CO2: 28 mEq/L (ref 19–32)
Calcium: 9.3 mg/dL (ref 8.4–10.5)
Chloride: 103 mEq/L (ref 96–112)
Creatinine, Ser: 0.83 mg/dL (ref 0.40–1.50)
GFR: 98.96 mL/min (ref >60.00)
Glucose, Bld: 87 mg/dL (ref 70–99)
Potassium: 4.2 mEq/L (ref 3.5–5.1)
Sodium: 138 mEq/L (ref 135–145)
Total Bilirubin: 0.5 mg/dL (ref 0.2–1.2)
Total Protein: 7.3 g/dL (ref 6.0–8.3)

## 2019-09-25 LAB — PROTIME-INR
INR: 1.1 ratio — ABNORMAL HIGH (ref 0.8–1.0)
Prothrombin Time: 12.9 s (ref 9.6–13.1)

## 2019-09-25 MED ORDER — BREO ELLIPTA 200-25 MCG/INH IN AEPB: 1.0000 | INHALATION_SPRAY | Freq: Every day | RESPIRATORY_TRACT | 1 refills | Status: DC

## 2019-09-25 MED ORDER — SPIRONOLACTONE 25 MG PO TABS: 50.0000 mg | ORAL_TABLET | Freq: Every day | ORAL | 1 refills | Status: DC

## 2019-09-25 MED ORDER — HYDROXYZINE HCL 50 MG PO TABS: 50.0000 mg | ORAL_TABLET | Freq: Three times a day (TID) | ORAL | 1 refills | Status: DC | PRN

## 2019-09-25 MED FILL — hydrOXYzine HCL 50 MG TABS: 50 | 20 days supply | Qty: 60 | Fill #0

## 2019-09-25 NOTE — Patient Instructions (Signed)
If you are age 47 or older, your body mass index should be between 23-30. Your Body mass index is 33.72 kg/m. If this is out of the aforementioned range listed, please consider follow up with your Primary Care Provider.  If you are age 13 or younger, your body mass index should be between 19-25. Your Body mass index is 33.72 kg/m. If this is out of the aformentioned range listed, please consider follow up with your Primary Care Provider.   Please go to the lab in the basement of our building to have lab work done as you leave today. Hit "B" for basement when you get on the elevator.  When the doors open the lab is on your left.  We will call you with the results. Thank you.  Please decrease your Protonix to once a day.  You have been scheduled for an abdominal ultrasound at [facility] (1st floor of hospital) on Wednesday, 09-30-19 at 11:00am. Please arrive 15 minutes prior to your appointment for registration. Make certain not to have anything to eat or drink 6 hours prior to your appointment. Should you need to reschedule your appointment, please contact radiology at [phone]. This test typically takes about 30 minutes to perform.  You are due for an Upper Endoscopy at [facility].  We will contact you when the January schedule comes out to get you scheduled.  You will need to be Covid Tested several days prior to the procedure and quarantine until the procedure.   Thank you for entrusting me with your care and for choosing [facility], Dr. Luke Cellar

## 2019-09-25 NOTE — Progress Notes (Signed)
HPI :  48 year old male here for follow-up visit.   History of cirrhosis related to alcohol use and history of hepatitis C. Previously been treated for hepatitis C with undetectable viral load.  His course has been complicated by bleeding esophageal varices in January 2020 when he had a relapse of alcohol use.  He had 6 bands placed per Dr. Ardis Hughs at that time.  I performed an endoscopy for him in March and placed an additional 4 bands to eradicate the varices.  He subsequently had a follow-up endoscopy with me in 05/03/2023 which did not show any significant varices at that time.  He has been doing a good job with abstaining from alcohol until his best friend passed away in 03-May-2023.  Since that time he was drinking a few beers per day, most days of the week.  Wife present today, confirms he did not drink more than that.  For the past 2 weeks he has completely abstained from alcohol and knows he needs to do so, still struggling with the grieve of his friend's passing.  He denies any further symptoms of bleeding.  He denies any problems with edema or ascites.  No jaundice.  No issues with encephalopathy.  I had given him lactulose in the past for questionable mild encephalopathy in the setting of constipation.  He is not been taking it much at all given his bowels have been normal and has been feeling well.  He does take 50 mg of Aldactone for mild edema.  He takes Coreg twice a day for the varices and his blood pressure.  His resting heart rates in the mid 50s.  He is having neck pain that radiates down his right arm from his C-spine, he is considering C-spine surgery if it is covered by his insurance at some point time.  I offered him a flu shot today which he declined, he states he never gets the flu shot.  He has been taking Protonix 40 mg twice a day since his variceal bleed.  He denies any problems of reflux or abdominal pains.  We discussed dosing changes as below  His last imaging of his liver was in  January 2020 with a CT scan which did not show any evidence of [place]   He has been vaccinated to hep A and B and completed the series in 2019.  Of note since of last seen him he had a colonoscopy with me and had multiple polyps removed.  He had edema in his colon from portal hypertension.   EGD 01/06/2018 - 2cm HH, no varices, portal hypertensive gastriits EGD 11/25/18 - large esophageal varices, one with red whale sign - banded x 6, severe portal hypertensive gastritis EGD 12/16/18 - grade II varices in the lower esophagus, procedure was aborted due to large volume of food in the stomach, no banding done  CT scan 12/11/2008 - cirrhotic appearing liver, ? Wall thickening of the ascending and proximal descending colon, small left inguinal and umbilical hernias  EGD 02/15/79 - Esophagogastric landmarks identified. - Grade II esophageal varix - overall improved since last exam. Banded x 3. - Mild portal hypertensive gastritis. - Normal duodenal bulb and second portion of the duodenum.  Colonoscopy - 01/19/19 - One 8 mm polyp in the cecum, removed with a cold snare. Resected and retrieved. - Two 5 to 6 mm polyps in the transverse colon, removed with a cold snare. Resected and retrieved. - One 5 mm polyp in the descending colon, removed with  a cold snare. Resected and retrieved. - One 5 mm polyp in the sigmoid colon, removed with a cold snare. Resected and retrieved. - Mulitple suspected rectal hyperplastic polyps - two representative lesions removed with a cold snare. Resected and retrieved. - One 8 mm polyp in the rectum, removed with a hot snare. Resected and retrieved. - Diverticulosis in the transverse colon and in the ascending colon. - Tortous colon - Edema throughout, consistent with change from portal hypertension, I suspect the cause of CT changes. - Internal hemorrhoids. - The examination was otherwise normal.  EGD 04/28/19 - Esophagogastric landmarks identified. - Small esophageal  varices that easily flattened with insufflation and stigmata of prior banding noted. No further banding performed today. - Normal stomach. - Normal duodenal bulb and second portion of the duodenum.       Past Medical History:  Diagnosis Date  . Alcoholism ([place])   . Blood transfusion without reported diagnosis    jan, 2020 3 units PRBC  . 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 Constipation    Resolved  . COPD (chronic obstructive pulmonary disease) ([place])   . DDD (degenerative disc disease), cervical   . ED (erectile dysfunction)   . Esophageal varices ([place])   . GERD (gastroesophageal reflux disease)   . H/O: upper GI bleed   . Hepatitis C antibody positive in blood    resolved  . High blood pressure   . History of anemia   . Left inguinal hernia 2020  . Lung nodule    Small  . Sleep apnea    in process of getting CPAP  . Substance abuse ([place])    was an alcoholic  . Umbilical hernia [id number]     Past Surgical History:  Procedure Laterality Date  . arm surgery     left arm,  . COLONOSCOPY WITH PROPOFOL N/A 01/19/2019   Procedure: COLONOSCOPY WITH PROPOFOL;  Surgeon: Yetta Flock, MD;  [facility]: [facility];  Service: Gastroenterology;  Laterality: N/A;  . ESOPHAGEAL BANDING  11/25/2018   Procedure: ESOPHAGEAL BANDING;  Surgeon: Milus Banister, MD;  [facility];  Service: Endoscopy;;  . ESOPHAGEAL BANDING  01/19/2019   Procedure: ESOPHAGEAL BANDING;  Surgeon: Yetta Flock, MD;  [facility];  Service: Gastroenterology;;  . ESOPHAGOGASTRODUODENOSCOPY (EGD) WITH PROPOFOL N/A 11/25/2018   Procedure: ESOPHAGOGASTRODUODENOSCOPY (EGD) WITH PROPOFOL;  Surgeon: Milus Banister, MD;  [facility];  Service: Endoscopy;  Laterality: N/A;  . ESOPHAGOGASTRODUODENOSCOPY (EGD) WITH PROPOFOL N/A 01/19/2019   Procedure: ESOPHAGOGASTRODUODENOSCOPY (EGD) WITH PROPOFOL;  Surgeon: Yetta Flock, MD;  [facility];   Service: Gastroenterology;  Laterality: N/A;  . ESOPHAGOGASTRODUODENOSCOPY (EGD) WITH PROPOFOL N/A 04/28/2019   Procedure: ESOPHAGOGASTRODUODENOSCOPY (EGD) WITH PROPOFOL;  Surgeon: Yetta Flock, MD;  [facility];  Service: Gastroenterology;  Laterality: N/A;  . KNEE SURGERY Left   . POLYPECTOMY  01/19/2019   Procedure: POLYPECTOMY;  Surgeon: Yetta Flock, MD;  Location[facility];  Service: Gastroenterology;;   Family History  Problem Relation Age of Onset  . Colitis Mother   . Arthritis Mother   . Cancer Father        lymphoma?  . Cancer Paternal Grandfather        bone  . Heart disease Neg Hx   . Stroke Neg Hx   . Diabetes Neg Hx    Social History   Tobacco Use  . Smoking status: Current Every Day Smoker  Packs/day: 1.00    Years: 30.00    Pack years: 30.00    Types: Cigarettes  . Smokeless tobacco: Former Systems developer    Types: Chew  Substance Use Topics  . Alcohol use: Not Currently    Frequency: Never    Comment: stopped in 11/2018  . Drug use: No   Current Outpatient Medications  Medication Sig Dispense Refill  . acetaminophen (TYLENOL) 500 MG tablet Take 1,000 mg by mouth every 6 (six) hours as needed (pain.).    Marland Kitchen albuterol (PROVENTIL) (2.5 MG/3ML) 0.083% nebulizer solution Take 3 mLs (2.5 mg total) by nebulization every 6 (six) hours as needed for wheezing or shortness of breath. 150 mL prn  . Ascorbic Acid (VITAMIN C) 1000 MG tablet Take 1,000 mg by mouth daily.    . ferrous sulfate 325 (65 FE) MG tablet Take 325 mg by mouth 2 (two) times daily.    Marland Kitchen loratadine (CLARITIN) 10 MG tablet Take 10 mg by mouth daily as needed for allergies.     . Milk Thistle 1000 MG CAPS Take 1,000 mg by mouth 2 (two) times daily.    . Multiple Vitamin (MULTIVITAMIN WITH MINERALS) TABS tablet Take 1 tablet by mouth daily.    . pantoprazole (PROTONIX) 40 MG tablet Take 1 tablet (40 mg total) by mouth daily. 60 tablet 2  . tadalafil (CIALIS) 5 MG tablet TAKE 1  TABLET (5 MG TOTAL) BY MOUTH DAILY. MUST HAVE OFFICE VISIT FOR REFILLS 30 tablet 0  . tamsulosin (FLOMAX) 0.4 MG CAPS capsule Take 0.4 mg by mouth daily.    . VENTOLIN HFA 108 (90 Base) MCG/ACT inhaler INHALE 1 PUFF EVERY 6 (SIX) HOURS AS NEEDED INTO THE LUNGS FOR WHEEZING OR SHORTNESS OF BREATH. (Patient taking differently: Inhale 1 puff into the lungs every 6 (six) hours as needed for wheezing or shortness of breath. ) 18 g 1  . carvedilol (COREG) 3.125 MG tablet Take 1 tablet (3.125 mg total) by mouth 2 (two) times daily with a meal. TAKE 1 TABLET (3.125 MG TOTAL) BY MOUTH 2 (TWO) TIMES DAILY. 60 tablet 3  . fluticasone furoate-vilanterol (BREO ELLIPTA) 200-25 MCG/INH AEPB Inhale 1 puff into the lungs daily. 180 each 1  . hydrOXYzine (ATARAX/VISTARIL) 50 MG tablet Take 1 tablet (50 mg total) by mouth 3 (three) times daily as needed for anxiety. 60 tablet 1  . Lactulose 20 GM/30ML SOLN Take 30 mLs (20 g total) by mouth daily. (Patient not taking: Reported on 08/17/2019) 450 mL 1  . spironolactone (ALDACTONE) 25 MG tablet Take 2 tablets (50 mg total) by mouth daily. 90 tablet 1   Current Facility-Administered Medications  Medication Dose Route Frequency Provider Last Rate Last Dose  . 0.9 %  sodium chloride infusion  500 mL Intravenous Continuous , Carlota Raspberry, MD       Allergies  Allergen Reactions  . Naproxen Hives and Swelling     Review of Systems: All systems reviewed and negative except where noted in HPI.   Lab Results  Component Value Date   WBC 6.9 09/25/2019   HGB 14.5 09/25/2019   HCT 42.9 09/25/2019   MCV 99.1 09/25/2019   PLT 145.0 (L) 09/25/2019    Lab Results  Component Value Date   CREATININE 0.83 09/25/2019   BUN 10 09/25/2019   NA 138 09/25/2019   K 4.2 09/25/2019   CL 103 09/25/2019   CO2 28 09/25/2019    Lab Results  Component Value Date   ALT 29  09/25/2019   AST 32 09/25/2019   ALKPHOS 80 09/25/2019   BILITOT 0.5 09/25/2019     Physical  Exam: BP 110/72 (BP Location: Left Arm, Patient Position: Sitting, Cuff Size: Normal)   Pulse (!) 55   Temp 97.8 F (36.6 C) (Oral)   Ht _0  (1.778 m)   Wt 235 lb (106.6 kg)   BMI 33.72 kg/m  Constitutional: Pleasant,well-developed, male in no acute distress. HEENT: Normocephalic and atraumatic. Conjunctivae are normal. No scleral icterus. Neck supple.  Cardiovascular: Normal rate, regular rhythm.  Pulmonary/chest: Effort normal and breath sounds normal. No wheezing, rales or rhonchi. Abdominal: Soft, nondistended, nontender. There are no masses palpable. No hepatomegaly. Extremities: no edema Lymphadenopathy: No cervical adenopathy noted. Neurological: Alert and oriented to person place and time. No asterixis Skin: Skin is warm and dry. No rashes noted. Psychiatric: Normal mood and affect. Behavior is normal.   ASSESSMENT AND PLAN: 48 year old male here for reassessment of the following:  Alcoholic cirrhosis / esophageal varices / history of hepatitis C - unfortunately has had some recurrent alcohol use since of last seen him while he grieves the death of his best friend.  We discussed this at length.  He is decompensated earlier this year with variceal bleeding if he continues to drink I am concerned about his risk for further decompensation and potential need of liver transplant in the future.  He understands and is working his best to abstain.  I offered him referral to behavioral health for this and he declines.  Otherwise he is due for a follow-up endoscopy at the hospital with banding of varices if amenable, this will be done likely in January timeframe.  I discussed risks and benefits and he wants to proceed with that.  He is due for basic labs today including CBC, c-Met, INR, AFP.  He is due for [facility] screening with a right upper quadrant ultrasound, will coordinate that.  He will continue to see me every 6 months or sooner with any issues. All questions answered.  Of note flu shot  was recommended and he declined.  GERD - I believe he was on high-dose Protonix following his severe bleed in January.  Reflux symptoms controlled, will reduce Protonix to 40 mg once a day at this time and taper down lower if amenable over time.  Carefree Cellar, MD [facility]astroenter[facility]

## 2019-09-25 NOTE — Progress Notes (Signed)
Assessment & Plan:  Levi Alvarez was seen today for follow-up.  Diagnoses and all orders for this visit:  Essential hypertension -     spironolactone (ALDACTONE) 25 MG tablet; Take 2 tablets (50 mg total) by mouth daily. Continue all antihypertensives as prescribed.  Remember to bring in your blood pressure log with you for your follow up appointment.  DASH/Mediterranean Diets are healthier choices for HTN.    Chronic obstructive pulmonary disease, unspecified COPD type (HCC) -     fluticasone furoate-vilanterol (BREO ELLIPTA) 200-25 MCG/INH AEPB; Inhale 1 puff into the lungs daily.  Tobacco dependence Levi Alvarez was counseled on the dangers of tobacco use, and was advised to quit. Reviewed strategies to maximize success, including removing cigarettes and smoking materials from environment, stress management and support of family/friends as well as pharmacological alternatives including: Wellbutrin, Chantix, Nicotine patch, Nicotine gum or lozenges. Smoking cessation support: smoking cessation hotline: 1-800-QUIT-NOW.  Smoking cessation classes are also available through [facility]. Call [phone] or visit our website at [email or web address]   A total of 3 minutes was spent on counseling for smoking cessation and Levi Alvarez is not ready to quit.   Anxiety and depression -     hydrOXYzine (ATARAX/VISTARIL) 50 MG tablet; Take 1 tablet (50 mg total) by mouth 3 (three) times daily as needed for anxiety.    Patient has been counseled on age-appropriate routine health concerns for screening and prevention. These are reviewed and up-to-date. Referrals have been placed accordingly. Immunizations are up-to-date or declined.    Subjective:   Chief Complaint  Patient presents with  . Follow-up    Pt. is here for blood pressure follow up.    HPI Levi Alvarez 48 y.o. male presents to office today for HTN  has a past medical history of Alcoholism ([place]), Blood transfusion  without reported diagnosis, Cirrhosis ([place]), Constipation, COPD (chronic obstructive pulmonary disease) ([place]), DDD (degenerative disc disease), cervical, ED (erectile dysfunction), Esophageal varices ([place]), GERD (gastroesophageal reflux disease), H/O: upper GI bleed, Hepatitis C antibody positive in blood, High blood pressure, History of anemia, Left inguinal hernia (2020), Lung nodule, Sleep apnea, Substance abuse ([place]), and Umbilical hernia ([id number]).  Currently seeing GI for his history of cirrhosis. S/P endoscopy with a total of 10 bands to eradicate varices. F/U endoscopy in June did not show any significant varices. Pantoprazole was decreased from BID to 40 mg daily at his last GI office visit 10-02-23.   His best friend died unexpectedly earlier this year. He is having a difficult time; grieving. He declines antidepressant. I offered Wellbutrin (smoking cessation and depression) however he declined today. Denies any current thoughts of self harm.     Essential Hypertension Well controlled. He endorses medication compliance taking carvedilol 3.125 mg daily, spironolactone 50 mg daily.  He does not monitor his blood pressure at home. Denies chest pain,  palpitations, lightheadedness, dizziness, headaches or BLE edema.  BP Readings from Last 3 Encounters:  02-Oct-2019 121/73  2019-10-02 110/72  04/28/19 (!) 141/86     Review of Systems  Constitutional: Negative for fever, malaise/fatigue and weight loss.  HENT: Negative.  Negative for nosebleeds.   Eyes: Negative.  Negative for blurred vision, double vision and photophobia.  Respiratory: Negative.  Negative for cough and shortness of breath.   Cardiovascular: Negative.  Negative for chest pain, palpitations and leg swelling.  Gastrointestinal: Negative.  Negative for heartburn, nausea and vomiting.  Musculoskeletal: Negative.  Negative for myalgias.  Neurological: Negative.  Negative for  dizziness, focal weakness, seizures and headaches.   Psychiatric/Behavioral: Positive for depression and substance abuse (history of alcoholism). Negative for suicidal ideas. The patient is nervous/anxious.     Past Medical History:  Diagnosis Date  . Alcoholism ([place])   . Blood transfusion without reported diagnosis    jan, 2020 3 units PRBC  . Cirrhosis ([place])    secondary to alcohol and hep C  . Constipation    Resolved  . COPD (chronic obstructive pulmonary disease) ([place])   . DDD (degenerative disc disease), cervical   . ED (erectile dysfunction)   . Esophageal varices ([place])   . GERD (gastroesophageal reflux disease)   . H/O: upper GI bleed   . Hepatitis C antibody positive in blood    resolved  . High blood pressure   . History of anemia   . Left inguinal hernia 2020  . Lung nodule    Small  . Sleep apnea    in process of getting CPAP  . Substance abuse ([place])    was an alcoholic  . Umbilical hernia [id number]    Past Surgical History:  Procedure Laterality Date  . arm surgery     left arm,  . COLONOSCOPY WITH PROPOFOL N/A 01/19/2019   Procedure: COLONOSCOPY WITH PROPOFOL;  Surgeon: Yetta Flock, MD;  Location: [facility];  Service: Gastroenterology;  Laterality: N/A;  . ESOPHAGEAL BANDING  11/25/2018   Procedure: ESOPHAGEAL BANDING;  Surgeon: Milus Banister, MD;  Location: [facility];  Service: Endoscopy;;  . ESOPHAGEAL BANDING  01/19/2019   Procedure: ESOPHAGEAL BANDING;  Surgeon: Yetta Flock, MD;  [facility];  Service: Gastroenterology;;  . ESOPHAGOGASTRODUODENOSCOPY (EGD) WITH PROPOFOL N/A 11/25/2018   Procedure: ESOPHAGOGASTRODUODENOSCOPY (EGD) WITH PROPOFOL;  Surgeon: Milus Banister, MD;  [facility];  Service: Endoscopy;  Laterality: N/A;  . ESOPHAGOGASTRODUODENOSCOPY (EGD) WITH PROPOFOL N/A 01/19/2019   Procedure: ESOPHAGOGASTRODUODENOSCOPY (EGD) WITH PROPOFOL;  Surgeon: Yetta Flock, MD;  [facility];  Service: Gastroenterology;  Laterality: N/A;  .  ESOPHAGOGASTRODUODENOSCOPY (EGD) WITH PROPOFOL N/A 04/28/2019   Procedure: ESOPHAGOGASTRODUODENOSCOPY (EGD) WITH PROPOFOL;  Surgeon: Yetta Flock, MD;  [facility];  Service: Gastroenterology;  Laterality: N/A;  . KNEE SURGERY Left   . POLYPECTOMY  01/19/2019   Procedure: POLYPECTOMY;  Surgeon: Yetta Flock, MD;  Location: [facility];  Service: Gastroenterology;;    Family History  Problem Relation Age of Onset  . Colitis Mother   . Arthritis Mother   . Cancer Father        lymphoma?  . Cancer Paternal Grandfather        bone  . Heart disease Neg Hx   . Stroke Neg Hx   . Diabetes Neg Hx     Social History Reviewed with no changes to be made today.   Outpatient Medications Prior to Visit  Medication Sig Dispense Refill  . acetaminophen (TYLENOL) 500 MG tablet Take 1,000 mg by mouth every 6 (six) hours as needed (pain.).    Marland Kitchen albuterol (PROVENTIL) (2.5 MG/3ML) 0.083% nebulizer solution Take 3 mLs (2.5 mg total) by nebulization every 6 (six) hours as needed for wheezing or shortness of breath. 150 mL prn  . Ascorbic Acid (VITAMIN C) 1000 MG tablet Take 1,000 mg by mouth daily.    Marland Kitchen loratadine (CLARITIN) 10 MG tablet Take 10 mg by mouth daily as needed for allergies.     . Milk Thistle 1000 MG CAPS Take 1,000 mg by mouth 2 (two) times daily.    Marland Kitchen  Multiple Vitamin (MULTIVITAMIN WITH MINERALS) TABS tablet Take 1 tablet by mouth daily.    . pantoprazole (PROTONIX) 40 MG tablet Take 1 tablet (40 mg total) by mouth daily. 60 tablet 2  . tadalafil (CIALIS) 5 MG tablet TAKE 1 TABLET (5 MG TOTAL) BY MOUTH DAILY. MUST HAVE OFFICE VISIT FOR REFILLS 30 tablet 0  . tamsulosin (FLOMAX) 0.4 MG CAPS capsule Take 0.4 mg by mouth daily.    . VENTOLIN HFA 108 (90 Base) MCG/ACT inhaler INHALE 1 PUFF EVERY 6 (SIX) HOURS AS NEEDED INTO THE LUNGS FOR WHEEZING OR SHORTNESS OF BREATH. (Patient taking differently: Inhale 1 puff into the lungs every 6 (six) hours as needed for wheezing  or shortness of breath. ) 18 g 1  . fluticasone furoate-vilanterol (BREO ELLIPTA) 200-25 MCG/INH AEPB Inhale 1 puff into the lungs daily. 180 each 1  . carvedilol (COREG) 3.125 MG tablet Take 1 tablet (3.125 mg total) by mouth 2 (two) times daily with a meal. TAKE 1 TABLET (3.125 MG TOTAL) BY MOUTH 2 (TWO) TIMES DAILY. 60 tablet 3  . ferrous sulfate 325 (65 FE) MG tablet Take 325 mg by mouth 2 (two) times daily.    . Lactulose 20 GM/30ML SOLN Take 30 mLs (20 g total) by mouth daily. (Patient not taking: Reported on 08/17/2019) 450 mL 1  . spironolactone (ALDACTONE) 50 MG tablet Take 1 tablet (50 mg total) by mouth daily. 30 tablet 1   Facility-Administered Medications Prior to Visit  Medication Dose Route Frequency Provider Last Rate Last Dose  . 0.9 %  sodium chloride infusion  500 mL Intravenous Continuous Armbruster, Carlota Raspberry, MD        Allergies  Allergen Reactions  . Naproxen Hives and Swelling       Objective:    BP 121/73 (BP Location: Left Arm, Patient Position: Sitting, Cuff Size: Large)   Pulse (!) 52   Temp 98.2 F (36.8 C) (Oral)   Ht 5\' 10"  (1.778 m)   Wt 235 lb (106.6 kg)   SpO2 97%   BMI 33.72 kg/m  Wt Readings from Last 3 Encounters:  09/25/19 235 lb (106.6 kg)  09/25/19 235 lb (106.6 kg)  04/28/19 230 lb (104.3 kg)    Physical Exam Vitals signs and nursing note reviewed.  Constitutional:      Appearance: He is well-developed.  HENT:     Head: Normocephalic and atraumatic.  Neck:     Musculoskeletal: Normal range of motion.  Cardiovascular:     Rate and Rhythm: Regular rhythm. Bradycardia present.     Heart sounds: Normal heart sounds. No murmur. No friction rub. No gallop.   Pulmonary:     Effort: Pulmonary effort is normal. No tachypnea or respiratory distress.     Breath sounds: Normal breath sounds. No decreased breath sounds, wheezing, rhonchi or rales.  Chest:     Chest wall: No tenderness.  Abdominal:     General: Bowel sounds are normal.      Palpations: Abdomen is soft.  Musculoskeletal: Normal range of motion.  Skin:    General: Skin is warm and dry.  Neurological:     Mental Status: He is alert and oriented to person, place, and time.     Coordination: Coordination normal.  Psychiatric:        Attention and Perception: Attention normal.        Mood and Affect: Mood and affect normal.        Speech: Speech normal.  Behavior: Behavior normal. Behavior is cooperative.        Thought Content: Thought content normal.        Cognition and Memory: Cognition and memory normal.        Judgment: Judgment normal.        Patient has been counseled extensively about nutrition and exercise as well as the importance of adherence with medications and regular follow-up. The patient was given clear instructions to go to ER or return to medical center if symptoms don't improve, worsen or new problems develop. The patient verbalized understanding.   Follow-up: Return in about 3 months (around 12/26/2019).   Gildardo Pounds, FNP-BC [facility] [place], [place]   09/27/2019, 11:07 PM

## 2019-09-27 ENCOUNTER — Encounter: Payer: Self-pay | Admitting: Nurse Practitioner

## 2019-09-27 MED ORDER — CARVEDILOL 3.125 MG PO TABS: 3.1250 mg | ORAL_TABLET | Freq: Two times a day (BID) | ORAL | 3 refills | Status: DC

## 2019-09-28 LAB — AFP TUMOR MARKER: AFP-Tumor Marker: 2.9 ng/mL (ref <6.1)

## 2019-09-30 ENCOUNTER — Ambulatory Visit (HOSPITAL_COMMUNITY)
Admission: RE | Admit: 2019-09-30 | Discharge: 2019-09-30 | Disposition: A | Discharge status: Home / Self Care | Payer: Medicaid Other | Source: Ambulatory Visit | Attending: Gastroenterology | Admitting: Gastroenterology

## 2019-09-30 ENCOUNTER — Other Ambulatory Visit: Payer: Self-pay

## 2019-09-30 DIAGNOSIS — Z8619 Personal history of other infectious and parasitic diseases: Secondary | ICD-10-CM | POA: Diagnosis present

## 2019-09-30 DIAGNOSIS — I8501 Esophageal varices with bleeding: Secondary | ICD-10-CM | POA: Insufficient documentation

## 2019-09-30 DIAGNOSIS — K219 Gastro-esophageal reflux disease without esophagitis: Secondary | ICD-10-CM | POA: Diagnosis present

## 2019-09-30 DIAGNOSIS — K7030 Alcoholic cirrhosis of liver without ascites: Secondary | ICD-10-CM | POA: Diagnosis present

## 2019-10-19 ENCOUNTER — Other Ambulatory Visit: Payer: Self-pay | Admitting: Nurse Practitioner

## 2019-10-19 DIAGNOSIS — N529 Male erectile dysfunction, unspecified: Secondary | ICD-10-CM

## 2019-10-19 MED FILL — ?CARVEDILOL 3.125 MG TABLET: 3.125 | 30 days supply | Qty: 60 | Fill #0

## 2019-10-19 MED FILL — ?SPIRONOLACTONE 25 MG TABLE: 25 | 30 days supply | Qty: 60 | Fill #0

## 2019-10-19 MED FILL — hydrOXYzine HCL 50 MG TABS: 50 | 20 days supply | Qty: 60 | Fill #0

## 2019-10-19 MED FILL — TAMSULOSIN HCL 0.4 MG CAP: 0.4 | 30 days supply | Qty: 30 | Fill #8

## 2019-10-19 MED FILL — PANTOPRAZOLE SOD DR 40 MG T: 40 | 30 days supply | Qty: 60 | Fill #1

## 2019-10-21 MED FILL — ?TADALAFIL 5 MG TABS: 5 | 30 days supply | Qty: 30 | Fill #0

## 2019-10-27 ENCOUNTER — Telehealth: Payer: Self-pay

## 2019-10-27 ENCOUNTER — Telehealth: Payer: Self-pay | Admitting: Gastroenterology

## 2019-10-27 ENCOUNTER — Other Ambulatory Visit: Payer: Self-pay

## 2019-10-27 DIAGNOSIS — I8500 Esophageal varices without bleeding: Secondary | ICD-10-CM

## 2019-10-27 NOTE — Telephone Encounter (Signed)
Patient scheduled for EGD w/poss. Banding on 11/03/19 with Dr. Loletha Carrow. Patient to arrive at 9:45am. COVID testing scheduled for 10/30/19 at 9:00am, patient knows to go through [facility] [address] and quarantine after testing until procedure. Instructions sent by My Chart and left message on the phone

## 2019-10-27 NOTE — Telephone Encounter (Signed)
Pt called to let you know that he received your message about appt for Covid test and procedure at [facility]. He is fine with both appts. Pls call him if there is any other information that he needs to know.

## 2019-10-30 ENCOUNTER — Other Ambulatory Visit (HOSPITAL_COMMUNITY)
Admission: RE | Admit: 2019-10-30 | Discharge: 2019-10-30 | Disposition: A | Discharge status: Home / Self Care | Payer: Medicaid Other | Source: Ambulatory Visit | Attending: Gastroenterology | Admitting: Gastroenterology

## 2019-10-30 DIAGNOSIS — Z01812 Encounter for preprocedural laboratory examination: Secondary | ICD-10-CM | POA: Diagnosis present

## 2019-10-30 DIAGNOSIS — Z20828 Contact with and (suspected) exposure to other viral communicable diseases: Secondary | ICD-10-CM | POA: Insufficient documentation

## 2019-10-31 LAB — NOVEL CORONAVIRUS, NAA (HOSP ORDER, SEND-OUT TO REF LAB; TAT 18-24 HRS): SARS-CoV-2, NAA: NOT DETECTED

## 2019-11-03 ENCOUNTER — Encounter (HOSPITAL_COMMUNITY)
Admission: RE | Disposition: A | Discharge status: Home / Self Care | Payer: Medicaid Other | Source: Home / Self Care | Attending: Gastroenterology

## 2019-11-03 ENCOUNTER — Ambulatory Visit (HOSPITAL_COMMUNITY)
Admission: RE | Admit: 2019-11-03 | Discharge: 2019-11-03 | Disposition: A | Discharge status: Home / Self Care | Payer: Medicaid Other | Attending: Gastroenterology | Admitting: Gastroenterology

## 2019-11-03 ENCOUNTER — Ambulatory Visit (HOSPITAL_COMMUNITY): Payer: Medicaid Other | Admitting: Registered Nurse

## 2019-11-03 ENCOUNTER — Encounter (HOSPITAL_COMMUNITY): Payer: Self-pay | Admitting: Gastroenterology

## 2019-11-03 ENCOUNTER — Other Ambulatory Visit: Payer: Self-pay

## 2019-11-03 DIAGNOSIS — I10 Essential (primary) hypertension: Secondary | ICD-10-CM | POA: Insufficient documentation

## 2019-11-03 DIAGNOSIS — K766 Portal hypertension: Secondary | ICD-10-CM | POA: Insufficient documentation

## 2019-11-03 DIAGNOSIS — M1732 Unilateral post-traumatic osteoarthritis, left knee: Secondary | ICD-10-CM | POA: Diagnosis not present

## 2019-11-03 DIAGNOSIS — F17210 Nicotine dependence, cigarettes, uncomplicated: Secondary | ICD-10-CM | POA: Insufficient documentation

## 2019-11-03 DIAGNOSIS — Z8619 Personal history of other infectious and parasitic diseases: Secondary | ICD-10-CM | POA: Diagnosis not present

## 2019-11-03 DIAGNOSIS — J449 Chronic obstructive pulmonary disease, unspecified: Secondary | ICD-10-CM | POA: Insufficient documentation

## 2019-11-03 DIAGNOSIS — Z79899 Other long term (current) drug therapy: Secondary | ICD-10-CM | POA: Diagnosis not present

## 2019-11-03 DIAGNOSIS — G4733 Obstructive sleep apnea (adult) (pediatric): Secondary | ICD-10-CM | POA: Diagnosis not present

## 2019-11-03 DIAGNOSIS — K219 Gastro-esophageal reflux disease without esophagitis: Secondary | ICD-10-CM | POA: Insufficient documentation

## 2019-11-03 DIAGNOSIS — I8500 Esophageal varices without bleeding: Secondary | ICD-10-CM | POA: Diagnosis not present

## 2019-11-03 DIAGNOSIS — K7460 Unspecified cirrhosis of liver: Secondary | ICD-10-CM | POA: Diagnosis not present

## 2019-11-03 DIAGNOSIS — K3189 Other diseases of stomach and duodenum: Secondary | ICD-10-CM | POA: Insufficient documentation

## 2019-11-03 DIAGNOSIS — Z886 Allergy status to analgesic agent status: Secondary | ICD-10-CM | POA: Diagnosis not present

## 2019-11-03 DIAGNOSIS — I8510 Secondary esophageal varices without bleeding: Secondary | ICD-10-CM

## 2019-11-03 HISTORY — PX: ESOPHAGOGASTRODUODENOSCOPY (EGD) WITH PROPOFOL: SHX5813

## 2019-11-03 SURGERY — ESOPHAGOGASTRODUODENOSCOPY (EGD) WITH PROPOFOL
Anesthesia: Monitor Anesthesia Care

## 2019-11-03 MED ORDER — SODIUM CHLORIDE 0.9 % IV SOLN: INTRAVENOUS | Status: DC

## 2019-11-03 MED ORDER — LIDOCAINE 2% (20 MG/ML) 5 ML SYRINGE
INTRAMUSCULAR | Status: DC | PRN
  Administered 2019-11-03: 100 mg via INTRAVENOUS

## 2019-11-03 MED ORDER — PROPOFOL 500 MG/50ML IV EMUL
INTRAVENOUS | Status: DC | PRN
  Administered 2019-11-03: 100 ug/kg/min via INTRAVENOUS

## 2019-11-03 MED ORDER — LACTATED RINGERS IV SOLN: INTRAVENOUS | Status: DC

## 2019-11-03 MED ORDER — PROPOFOL 10 MG/ML IV BOLUS
INTRAVENOUS | Status: DC | PRN
  Administered 2019-11-03: 40 mg via INTRAVENOUS
  Administered 2019-11-03 (×2): 30 mg via INTRAVENOUS
  Administered 2019-11-03: 40 mg via INTRAVENOUS
  Administered 2019-11-03: 30 mg via INTRAVENOUS

## 2019-11-03 SURGICAL SUPPLY — 15 items

## 2019-11-03 NOTE — Op Note (Signed)
[facility] Patient Name: Levi Alvarez Procedure Date: 11/03/2019 MRN: [id number] Attending MD: Estill Cotta. Loletha Carrow , MD Date of Birth: 1971-08-28 CSN: [id number] Age: 48 Admit Type: Outpatient Procedure:                Upper GI endoscopy Indications:              2nd degree variceal surveillance (following bleed                            and completed eradication) - variceal bleeding                            early 2020. No banding required on last                            surveillance EGD June 2020 while on carvedilol.                            Patient currently on carvedilol Providers:                Mallie Mussel L. Loletha Carrow, MD, Cleda Daub, RN, Marguerita Merles, Technician, Corie Chiquito, Technician, Marla Roe, CRNA Referring MD:             Cortez Cellar, MD Medicines:                Monitored Anesthesia Care Complications:            No immediate complications. Estimated Blood Loss:     Estimated blood loss: none. Procedure:                Pre-Anesthesia Assessment:                           - Prior to the procedure, a History and Physical                            was performed, and patient medications and                            allergies were reviewed. The patient's tolerance of                            previous anesthesia was also reviewed. The risks                            and benefits of the procedure and the sedation                            options and risks were discussed with the patient.  All questions were answered, and informed consent                            was obtained. Prior Anticoagulants: The patient has                            taken no previous anticoagulant or antiplatelet                            agents. ASA Grade Assessment: III - A patient with                            severe systemic disease. After reviewing the risks        and benefits, the patient was deemed in                            satisfactory condition to undergo the procedure.                           After obtaining informed consent, the endoscope was                            passed under direct vision. Throughout the                            procedure, the patient's blood pressure, pulse, and                            oxygen saturations were monitored continuously. The                            GIF-H190 ([id number]) Olympus gastroscope was                            introduced through the mouth, and advanced to the                            second part of duodenum. The upper GI endoscopy was                            accomplished without difficulty. The patient                            tolerated the procedure fairly well. Scope In: Scope Out: Findings:      Grade I varices were found in the lower third of the esophagus. No       banding required.      Portal hypertensive gastropathy was found in the entire examined stomach.      A large amount of food (residue) was found in the entire examined       stomach. This limited visualization. Retroflexion performed in stomach,       but gastric cardia/fundus not well visualized as a result of retained       food.      The examined duodenum  was normal. Impression:               - Grade I esophageal varices.                           - Portal hypertensive gastropathy.                           - A large amount of food (residue) in the stomach.                            Not seen on prior surveillance EGD - suspect                            non-adherence to pre-procedure dietary instructions.                           - Normal examined duodenum.                           - No specimens collected. Moderate Sedation:      MAC sedation used Recommendation:           - Patient has a contact number available for                            emergencies. The signs and symptoms of potential                             delayed complications were discussed with the                            patient. Return to normal activities tomorrow.                            Written discharge instructions were provided to the                            patient.                           - Resume previous diet.                           - Continue present medications.                           - Return to GI office at appointment to be                            scheduled. Procedure Code(s):        --- Professional ---                           [phone], Esophagogastroduodenoscopy, flexible,                            transoral; diagnostic,  including collection of                            specimen(s) by brushing or washing, when performed                            (separate procedure) Diagnosis Code(s):        --- Professional ---                           I85.00, Esophageal varices without bleeding                           K76.6, Portal hypertension                           K31.89, Other diseases of stomach and duodenum CPT copyright 2019 American Medical Association. All rights reserved. The codes documented in this report are preliminary and upon coder review may  be revised to meet current compliance requirements. Madeliene Tejera L. Loletha Carrow, MD 11/03/2019 11:27:02 AM This report has been signed electronically. Number of Addenda: 0

## 2019-11-03 NOTE — Discharge Instructions (Signed)
YOU HAD AN ENDOSCOPIC PROCEDURE TODAY: Refer to the procedure report and other information in the discharge instructions given to you for any specific questions about what was found during the examination. If this information does not answer your questions, please call [place] office at [phone] to clarify.  ° °YOU SHOULD EXPECT: Some feelings of bloating in the abdomen. Passage of more gas than usual. Walking can help get rid of the air that was put into your GI tract during the procedure and reduce the bloating. If you had a lower endoscopy (such as a colonoscopy or flexible sigmoidoscopy) you may notice spotting of blood in your stool or on the toilet paper. Some abdominal soreness may be present for a day or two, also. ° °DIET: Your first meal following the procedure should be a light meal and then it is ok to progress to your normal diet. A half-sandwich or bowl of soup is an example of a good first meal. Heavy or fried foods are harder to digest and may make you feel nauseous or bloated. Drink plenty of fluids but you should avoid alcoholic beverages for 24 hours. If you had a esophageal dilation, please see attached instructions for diet.   ° °ACTIVITY: Your care partner should take you home directly after the procedure. You should plan to take it easy, moving slowly for the rest of the day. You can resume normal activity the day after the procedure however YOU SHOULD NOT DRIVE, use power tools, machinery or perform tasks that involve climbing or major physical exertion for 24 hours (because of the sedation medicines used during the test).  ° °SYMPTOMS TO REPORT IMMEDIATELY: °A gastroenterologist can be reached at any hour. Please call [phone]  for any of the following symptoms:  °Following lower endoscopy (colonoscopy, flexible sigmoidoscopy) °Excessive amounts of blood in the stool  °Significant tenderness, worsening of abdominal pains  °Swelling of the abdomen that is new, acute  °Fever of 100° or  higher  °Following upper endoscopy (EGD, EUS, ERCP, esophageal dilation) °Vomiting of blood or coffee ground material  °New, significant abdominal pain  °New, significant chest pain or pain under the shoulder blades  °Painful or persistently difficult swallowing  °New shortness of breath  °Black, tarry-looking or red, bloody stools ° °FOLLOW UP:  °If any biopsies were taken you will be contacted by phone or by letter within the next 1-3 weeks. Call [phone]  if you have not heard about the biopsies in 3 weeks.  °Please also call with any specific questions about appointments or follow up tests. ° °

## 2019-11-03 NOTE — H&P (Signed)
History:  This patient presents for endoscopic testing for esophageal varices (Armbruster patient - prior bleeding and subsequent surveillance with prn banding).  [name] Referring physician: Gildardo Pounds, NP  Past Medical History: Past Medical History:  Diagnosis Date  . Alcoholism ([place])   . Blood transfusion without reported diagnosis    jan, 2020 3 units PRBC  . Cirrhosis ([place])    secondary to alcohol and hep C  . Constipation    Resolved  . COPD (chronic obstructive pulmonary disease) ([place])   . DDD (degenerative disc disease), cervical   . ED (erectile dysfunction)   . Esophageal varices ([place])   . GERD (gastroesophageal reflux disease)   . H/O: upper GI bleed   . Hepatitis C antibody positive in blood    resolved  . High blood pressure   . History of anemia   . Left inguinal hernia 2020  . Lung nodule    Small  . Sleep apnea    in process of getting CPAP  . Substance abuse ([place])    was an alcoholic  . Umbilical hernia [id number]     Past Surgical History: Past Surgical History:  Procedure Laterality Date  . arm surgery     left arm,  . COLONOSCOPY WITH PROPOFOL N/A 01/19/2019   Procedure: COLONOSCOPY WITH PROPOFOL;  Surgeon: Yetta Flock, MD;  [facility];  Service: Gastroenterology;  Laterality: N/A;  . ESOPHAGEAL BANDING  11/25/2018   Procedure: ESOPHAGEAL BANDING;  Surgeon: Milus Banister, MD;  [facility];  Service: Endoscopy;;  . ESOPHAGEAL BANDING  01/19/2019   Procedure: ESOPHAGEAL BANDING;  Surgeon: Yetta Flock, MD;  [facility];  Service: Gastroenterology;;  . ESOPHAGOGASTRODUODENOSCOPY (EGD) WITH PROPOFOL N/A 11/25/2018   Procedure: ESOPHAGOGASTRODUODENOSCOPY (EGD) WITH PROPOFOL;  Surgeon: Milus Banister, MD;  [facility];  Service: Endoscopy;  Laterality: N/A;  . ESOPHAGOGASTRODUODENOSCOPY (EGD) WITH PROPOFOL N/A 01/19/2019   Procedure: ESOPHAGOGASTRODUODENOSCOPY (EGD) WITH PROPOFOL;   Surgeon: Yetta Flock, MD;  [facility];  Service: Gastroenterology;  Laterality: N/A;  . ESOPHAGOGASTRODUODENOSCOPY (EGD) WITH PROPOFOL N/A 04/28/2019   Procedure: ESOPHAGOGASTRODUODENOSCOPY (EGD) WITH PROPOFOL;  Surgeon: Yetta Flock, MD;  [facility];  Service: Gastroenterology;  Laterality: N/A;  . KNEE SURGERY Left   . POLYPECTOMY  01/19/2019   Procedure: POLYPECTOMY;  Surgeon: Yetta Flock, MD;  Location: [facility];  Service: Gastroenterology;;    Allergies: Allergies  Allergen Reactions  . Naproxen Hives and Swelling    Outpatient Meds: Current Facility-Administered Medications  Medication Dose Route Frequency Provider Last Rate Last Admin  . 0.9 %  sodium chloride infusion   Intravenous Continuous Armbruster, Carlota Raspberry, MD      . lactated ringers infusion   Intravenous Continuous Nelida Meuse III, MD 125 mL/hr at 11/03/19 1052 New Bag at 11/03/19 1052      ___________________________________________________________________ Objective   Exam:  There were no vitals taken for this visit.   CV: RRR without murmur, S1/S2, no JVD, no peripheral edema  Resp: clear to auscultation bilaterally, normal RR and effort noted  GI: soft, no tenderness, with active bowel sounds. No guarding or palpable organomegaly noted.  Neuro: awake, alert and oriented x 3. Normal gross motor function and fluent speech   Assessment:  Esophageal varices with prior bleeding  Plan:  Surveillance EGD and possible EBL if needed.   Nelida Meuse III

## 2019-11-03 NOTE — Interval H&P Note (Signed)
History and Physical Interval Note:  11/03/2019 10:59 AM  Levi Alvarez  has presented today for surgery, with the diagnosis of survellance of esophageal varices.  The various methods of treatment have been discussed with the patient and family. After consideration of risks, benefits and other options for treatment, the patient has consented to  Procedure(s): ESOPHAGOGASTRODUODENOSCOPY (EGD) WITH PROPOFOL (N/A) as a surgical intervention.  The patient's history has been reviewed, patient examined, no change in status, stable for surgery.  I have reviewed the patient's chart and labs.  Questions were answered to the patient's satisfaction.     Nelida Meuse III

## 2019-11-03 NOTE — Transfer of Care (Signed)
Immediate Anesthesia Transfer of Care Note  Patient: Levi Alvarez  Procedure(s) Performed: ESOPHAGOGASTRODUODENOSCOPY (EGD) WITH PROPOFOL (N/A )  Patient Location: PACU  Anesthesia Type:MAC  Level of Consciousness: awake, alert  and oriented  Airway & Oxygen Therapy: Patient Spontanous Breathing and Patient connected to face mask oxygen  Post-op Assessment: Report given to RN and Post -op Vital signs reviewed and stable  Post vital signs: Reviewed and stable  Last Vitals:  Vitals Value Taken Time  BP    Temp    Pulse    Resp    SpO2      Last Pain:  Vitals:   11/03/19 1045  TempSrc: Oral  PainSc: 0-No pain         Complications: No apparent anesthesia complications

## 2019-11-03 NOTE — Anesthesia Procedure Notes (Signed)
Date/Time: 11/03/2019 11:10 AM Performed by: Talbot Grumbling, CRNA Oxygen Delivery Method: Simple face mask

## 2019-11-03 NOTE — Anesthesia Postprocedure Evaluation (Signed)
Anesthesia Post Note  Patient: Levi Alvarez  Procedure(s) Performed: ESOPHAGOGASTRODUODENOSCOPY (EGD) WITH PROPOFOL (N/A )     Patient location during evaluation: PACU Anesthesia Type: MAC Level of consciousness: awake and alert Pain management: pain level controlled Vital Signs Assessment: post-procedure vital signs reviewed and stable Respiratory status: spontaneous breathing, nonlabored ventilation, respiratory function stable and patient connected to nasal cannula oxygen Cardiovascular status: stable and blood pressure returned to baseline Postop Assessment: no apparent nausea or vomiting Anesthetic complications: no    Last Vitals:  Vitals:   11/03/19 1129 11/03/19 1130  BP: 116/69   Pulse: 66   Resp: (!) 24   Temp:  37 C  SpO2: 100%     Last Pain:  Vitals:   11/03/19 1130  TempSrc: Temporal  PainSc:                  [name]

## 2019-11-03 NOTE — Anesthesia Preprocedure Evaluation (Signed)
Anesthesia Evaluation  Patient identified by MRN, date of birth, ID band Patient awake    Reviewed: Allergy & Precautions, NPO status , Patient's Chart, lab work & pertinent test results  History of Anesthesia Complications Negative for: history of anesthetic complications  Airway Mallampati: II  TM Distance: >3 FB Neck ROM: Full    Dental  (+) Teeth Intact, Dental Advisory Given,    Pulmonary sleep apnea , COPD, neg recent URI, Current Smoker,    breath sounds clear to auscultation       Cardiovascular hypertension, Pt. on medications  Rhythm:Regular Rate:Normal     Neuro/Psych PSYCHIATRIC DISORDERS negative neurological ROS     GI/Hepatic GERD  ,(+) Cirrhosis       , Hepatitis -, CHgb 9.9   Endo/Other    Renal/GU      Musculoskeletal  (+) Arthritis ,   Abdominal   Peds  Hematology  (+) anemia ,   Anesthesia Other Findings   Reproductive/Obstetrics                             Anesthesia Physical Anesthesia Plan  ASA: III  Anesthesia Plan: MAC   Post-op Pain Management:    Induction: Intravenous  PONV Risk Score and Plan: 1 and Treatment may vary due to age or medical condition and Propofol infusion  Airway Management Planned: Nasal Cannula  Additional Equipment: None  Intra-op Plan:   Post-operative Plan:   Informed Consent: I have reviewed the patients History and Physical, chart, labs and discussed the procedure including the risks, benefits and alternatives for the proposed anesthesia with the patient or authorized representative who has indicated his/her understanding and acceptance.     Dental advisory given  Plan Discussed with: CRNA and Surgeon  Anesthesia Plan Comments:         Anesthesia Quick Evaluation

## 2019-11-04 ENCOUNTER — Encounter: Payer: Self-pay | Admitting: *Deleted

## 2019-11-04 ENCOUNTER — Telehealth: Payer: Self-pay

## 2019-11-04 NOTE — Telephone Encounter (Signed)
-----   Message from Yetta Flock, MD sent at 11/03/2019  4:59 PM EST ----- Sherlynn Stalls can you help coordinate clinic follow up with me for this patient? Thanks ----- Message ----- From: Doran Stabler, MD Sent: 11/03/2019   1:15 PM EST To: Yetta Flock, MD  EGD done  - small varices while on carvedilol - no banding performed.  Needs office follow up with you for cirrhosis  - HD

## 2019-11-04 NOTE — Telephone Encounter (Signed)
Scheduled office visit with Dr. Havery Moros on 12/11/19 (first available). Patient good with this

## 2019-11-16 ENCOUNTER — Other Ambulatory Visit: Payer: Self-pay | Admitting: Nurse Practitioner

## 2019-11-16 DIAGNOSIS — N529 Male erectile dysfunction, unspecified: Secondary | ICD-10-CM

## 2019-11-16 MED FILL — PANTOPRAZOLE SOD DR 40 MG T: 40 | 30 days supply | Qty: 60 | Fill #2

## 2019-11-16 MED FILL — ?CARVEDILOL 3.125 MG TABLET: 3.125 | 30 days supply | Qty: 60 | Fill #1

## 2019-11-16 MED FILL — TAMSULOSIN HCL 0.4 MG CAP: 0.4 | 30 days supply | Qty: 30 | Fill #9

## 2019-11-17 MED FILL — ?TADALAFIL 5 MG TABS: 5 | 30 days supply | Qty: 30 | Fill #0

## 2019-11-23 MED FILL — hydrOXYzine HCL 50 MG TABS: 50 | 20 days supply | Qty: 60 | Fill #1

## 2019-11-26 MED FILL — ?CARVEDILOL 3.125 MG TABLET: 3.125 | 30 days supply | Qty: 60 | Fill #1

## 2019-11-26 MED FILL — PANTOPRAZOLE SOD DR 40 MG T: 40 | 30 days supply | Qty: 60 | Fill #2

## 2019-11-26 MED FILL — ?TADALAFIL 5 MG TABS: 5 | 30 days supply | Qty: 30 | Fill #0

## 2019-11-26 MED FILL — TAMSULOSIN HCL 0.4 MG CAP: 0.4 | 30 days supply | Qty: 30 | Fill #9

## 2019-12-11 ENCOUNTER — Other Ambulatory Visit: Payer: Self-pay

## 2019-12-11 ENCOUNTER — Encounter: Payer: Self-pay | Admitting: Gastroenterology

## 2019-12-11 ENCOUNTER — Ambulatory Visit (INDEPENDENT_AMBULATORY_CARE_PROVIDER_SITE_OTHER): Payer: Self-pay | Admitting: Gastroenterology

## 2019-12-11 VITALS — BP 140/88 | HR 79 | Temp 98.0°F | Ht 70.0 in | Wt 238.4 lb

## 2019-12-11 DIAGNOSIS — K7460 Unspecified cirrhosis of liver: Secondary | ICD-10-CM

## 2019-12-11 DIAGNOSIS — K219 Gastro-esophageal reflux disease without esophagitis: Secondary | ICD-10-CM

## 2019-12-11 DIAGNOSIS — I8500 Esophageal varices without bleeding: Secondary | ICD-10-CM

## 2019-12-11 DIAGNOSIS — I8510 Secondary esophageal varices without bleeding: Secondary | ICD-10-CM

## 2019-12-11 NOTE — Progress Notes (Signed)
HPI :  49 year old male here for follow-up visit for cirrhosis.  History of cirrhosis related to alcohol use and history of hepatitis C. Previously been treated for hepatitis C with undetectable viral load.  His course has been complicated by bleeding esophageal varices in January 2020 when he had a relapse of alcohol use.  He was banded at that time, and then banded again during a follow up EGD last March.  He subsequently had a follow-up endoscopy with me in June which did not show any significant varices at that time, and then again most recently by Dr. Loletha Carrow this past December.  In general has been doing really well since have last seen him.  He previously had been drinking alcohol at her last visit when he relapsed after his best friend passed away.  He has pretty much stopped drinking completely since that time aside from a few beers on his birthday.  Denies any problems with ascites or edema.  He is on spironolactone at 50 mg a day actually for treatment of his blood pressure.  No jaundice.  No problems with encephalopathy.  He is taking Coreg twice a day and tolerating this in light of his variceal bleeding history.  He previously was on lactulose for history of questionable encephalopathy in the setting of constipation, but he is not been taking that at all lately and having no issues with encephalopathy.  He has been taking Protonix 40 mg once a day for reflux and is controlling his symptoms well.  Last ultrasound for [facility] screening was in November and did not show any new concerning lesions in his liver.  He is next due in May.  He denies any abdominal pains at this time.   He has been vaccinated to hep A and B and completed the series in 2019.   EGD 01/06/2018 - 2cm HH, no varices, portal hypertensive gastriits EGD 11/25/18 - large esophageal varices, one with red whale sign - banded x 6, severe portal hypertensive gastritis EGD 12/16/18 - grade II varices in the lower esophagus,  procedure was aborted due to large volume of food in the stomach, no banding done  CT scan 12/11/2008 - cirrhotic appearing liver, ? Wall thickening of the ascending and proximal descending colon, small left inguinal and umbilical hernias  EGD 123456 - Esophagogastric landmarks identified. - Grade II esophageal varix - overall improved since last exam. Banded x 3. - Mild portal hypertensive gastritis. - Normal duodenal bulb and second portion of the duodenum.  Colonoscopy - 01/19/19 - One 8 mm polyp in the cecum, removed with a cold snare. Resected and retrieved. - Two 5 to 6 mm polyps in the transverse colon, removed with a cold snare. Resected and retrieved. - One 5 mm polyp in the descending colon, removed with a cold snare. Resected and retrieved. - One 5 mm polyp in the sigmoid colon, removed with a cold snare. Resected and retrieved. - Mulitple suspected rectal hyperplastic polyps - two representative lesions removed with a cold snare. Resected and retrieved. - One 8 mm polyp in the rectum, removed with a hot snare. Resected and retrieved. - Diverticulosis in the transverse colon and in the ascending colon. - Tortous colon - Edema throughout, consistent with change from portal hypertension, I suspect the cause of CT changes. - Internal hemorrhoids. - The examination was otherwise normal.  EGD 04/28/19 - Esophagogastric landmarks identified. - Small esophageal varices that easily flattened with insufflation and stigmata of prior banding noted. No further  banding performed today. - Normal stomach. - Normal duodenal bulb and second portion of the duodenum.  EGD 11/03/19 - Dr. Loletha Carrow. Grade I varices were found in the lower third of the esophagus. No banding required. Portal hypertensive gastropathy was found in the entire examined stomach. A large amount of food (residue) was found in the entire examined stomach. This limited visualization. Retroflexion performed in stomach, but  gastric cardia/fundus not well visualized as a result of retained Food. The examined duodenum was normal.   Past Medical History:  Diagnosis Date  . Alcoholism ([place])   . Blood transfusion without reported diagnosis    jan, 2020 3 units PRBC  . Cirrhosis ([place])    secondary to alcohol and hep C  . Constipation    Resolved  . COPD (chronic obstructive pulmonary disease) ([place])   . DDD (degenerative disc disease), cervical   . ED (erectile dysfunction)   . Esophageal varices ([place])   . GERD (gastroesophageal reflux disease)   . H/O: upper GI bleed   . Hepatitis C antibody positive in blood    resolved  . High blood pressure   . History of anemia   . Left inguinal hernia 2020  . Lung nodule    Small  . Sleep apnea    in process of getting CPAP  . Substance abuse ([place])    was an alcoholic  . Umbilical hernia [id number]     Past Surgical History:  Procedure Laterality Date  . arm surgery     left arm,  . COLONOSCOPY WITH PROPOFOL N/A 01/19/2019   Procedure: COLONOSCOPY WITH PROPOFOL;  Surgeon: Yetta Flock, MD;  Location: [facility];  Service: Gastroenterology;  Laterality: N/A;  . ESOPHAGEAL BANDING  11/25/2018   Procedure: ESOPHAGEAL BANDING;  Surgeon: Milus Banister, MD;  [facility]: [facility];  Service: Endoscopy;;  . ESOPHAGEAL BANDING  01/19/2019   Procedure: ESOPHAGEAL BANDING;  Surgeon: Yetta Flock, MD;  [facility];  Service: Gastroenterology;;  . ESOPHAGOGASTRODUODENOSCOPY (EGD) WITH PROPOFOL N/A 11/25/2018   Procedure: ESOPHAGOGASTRODUODENOSCOPY (EGD) WITH PROPOFOL;  Surgeon: Milus Banister, MD;  [facility];  Service: Endoscopy;  Laterality: N/A;  . ESOPHAGOGASTRODUODENOSCOPY (EGD) WITH PROPOFOL N/A 01/19/2019   Procedure: ESOPHAGOGASTRODUODENOSCOPY (EGD) WITH PROPOFOL;  Surgeon: Yetta Flock, MD;  [facility];  Service: Gastroenterology;  Laterality: N/A;  . ESOPHAGOGASTRODUODENOSCOPY (EGD) WITH PROPOFOL N/A  04/28/2019   Procedure: ESOPHAGOGASTRODUODENOSCOPY (EGD) WITH PROPOFOL;  Surgeon: Yetta Flock, MD;  [facility];  Service: Gastroenterology;  Laterality: N/A;  . ESOPHAGOGASTRODUODENOSCOPY (EGD) WITH PROPOFOL N/A 11/03/2019   Procedure: ESOPHAGOGASTRODUODENOSCOPY (EGD) WITH PROPOFOL;  Surgeon: Doran Stabler, MD;  [facility];  Service: Gastroenterology;  Laterality: N/A;  . KNEE SURGERY Left   . POLYPECTOMY  01/19/2019   Procedure: POLYPECTOMY;  Surgeon: Yetta Flock, MD;  [facility];  Service: Gastroenterology;;   Family History  Problem Relation Age of Onset  . Colitis Mother   . Arthritis Mother   . Cancer Father        lymphoma?  . Cancer Paternal Grandfather        bone  . Heart disease Neg Hx   . Stroke Neg Hx   . Diabetes Neg Hx    Social History   Tobacco Use  . Smoking status: Current Every Day Smoker    Packs/day: 0.50    Years: 30.00    Pack years: 15.00    Types: Cigarettes  . Smokeless tobacco: Former  User    Types: Chew  Substance Use Topics  . Alcohol use: Not Currently    Comment: stopped in 11/2018  . Drug use: No   Current Outpatient Medications  Medication Sig Dispense Refill  . acetaminophen (TYLENOL) 500 MG tablet Take 1,000 mg by mouth every 6 (six) hours as needed (pain.).    Marland Kitchen albuterol (PROVENTIL) (2.5 MG/3ML) 0.083% nebulizer solution Take 3 mLs (2.5 mg total) by nebulization every 6 (six) hours as needed for wheezing or shortness of breath. 150 mL prn  . carvedilol (COREG) 3.125 MG tablet Take 1 tablet (3.125 mg total) by mouth 2 (two) times daily with a meal. TAKE 1 TABLET (3.125 MG TOTAL) BY MOUTH 2 (TWO) TIMES DAILY. 60 tablet 3  . fluticasone furoate-vilanterol (BREO ELLIPTA) 200-25 MCG/INH AEPB Inhale 1 puff into the lungs daily. 180 each 1  . loratadine (CLARITIN) 10 MG tablet Take 10 mg by mouth daily as needed for allergies.     . Milk Thistle 1000 MG CAPS Take 1,000 mg by mouth 2 (two)  times daily.    . Multiple Vitamin (MULTIVITAMIN WITH MINERALS) TABS tablet Take 1 tablet by mouth daily.    . pantoprazole (PROTONIX) 40 MG tablet Take 1 tablet (40 mg total) by mouth daily. 60 tablet 2  . spironolactone (ALDACTONE) 25 MG tablet Take 2 tablets (50 mg total) by mouth daily. (Patient taking differently: Take 25 mg by mouth daily. ) 90 tablet 1  . tadalafil (CIALIS) 5 MG tablet TAKE 1 TABLET (5 MG TOTAL) BY MOUTH DAILY. MUST HAVE OFFICE VISIT FOR REFILLS 30 tablet 0  . tamsulosin (FLOMAX) 0.4 MG CAPS capsule Take 0.4 mg by mouth daily.    . VENTOLIN HFA 108 (90 Base) MCG/ACT inhaler INHALE 1 PUFF EVERY 6 (SIX) HOURS AS NEEDED INTO THE LUNGS FOR WHEEZING OR SHORTNESS OF BREATH. (Patient taking differently: Inhale 1 puff into the lungs every 6 (six) hours as needed for wheezing or shortness of breath. ) 18 g 1   Current Facility-Administered Medications  Medication Dose Route Frequency Provider Last Rate Last Admin  . 0.9 %  sodium chloride infusion  500 mL Intravenous Continuous Gloria Ricardo, Carlota Raspberry, MD       Allergies  Allergen Reactions  . Naproxen Hives and Swelling     Review of Systems: All systems reviewed and negative except where noted in HPI.   Lab Results  Component Value Date   CREATININE 0.83 09/25/2019   BUN 10 09/25/2019   NA 138 09/25/2019   K 4.2 09/25/2019   CL 103 09/25/2019   CO2 28 09/25/2019    Lab Results  Component Value Date   ALT 29 09/25/2019   AST 32 09/25/2019   ALKPHOS 80 09/25/2019   BILITOT 0.5 09/25/2019    Lab Results  Component Value Date   INR 1.1 (H) 09/25/2019   INR 1.25 11/26/2018   INR 1.32 11/24/2018   Lab Results  Component Value Date   WBC 6.9 09/25/2019   HGB 14.5 09/25/2019   HCT 42.9 09/25/2019   MCV 99.1 09/25/2019   PLT 145.0 (L) 09/25/2019     Physical Exam: BP 140/88   Pulse 79   Temp 98 F (36.7 C)   Ht 5\' 10"  (1.778 m)   Wt 238 lb 6.4 oz (108.1 kg)   BMI 34.21 kg/m  Constitutional:  Pleasant,well-developed, male in no acute distress. HEENT: Normocephalic and atraumatic. Conjunctivae are normal. No scleral icterus. Lymphadenopathy: No cervical adenopathy noted. Neurological:  Alert and oriented to person place and time. Skin: Skin is warm and dry. No rashes noted. Psychiatric: Normal mood and affect. Behavior is normal.   ASSESSMENT AND PLAN: 49 year old male here for reassessment of the following:  Alcoholic cirrhosis / esophageal varices - generally doing pretty well since of last seen him.  His last EGD in December showed no recurrence of varices and did not require any further banding.  This is good news.  We will plan for another EGD 1 year from this last exam for further surveillance.  He will continue Coreg, he is tolerating it well.  Otherwise he is due for [facility] screening with another ultrasound in May.  His labs are up-to-date and we will repeat those labs again in May.  I discussed risk for long-term decompensation and HCC and he verbalized understanding.  We again discussed alcohol use and recommend he continue to abstain as best he can.  He will continue to see me every 6 months or sooner with any issues.  He agreed all questions answered.  GERD - well controlled on Protonix 40 mg once a day.  He will continue the regimen for now.  I spent 30 minutes of time, including in depth chart review, independent review of results as outlined above, communicating results with the patient directly, face-to-face time with the patient, coordinating care, and ordering studies and medications as appropriate, and documenting this encounter.   Home [place] Cellar, MD [facility]astroenter[facility]

## 2019-12-23 ENCOUNTER — Other Ambulatory Visit: Payer: Self-pay | Admitting: Family Medicine

## 2019-12-23 ENCOUNTER — Other Ambulatory Visit: Payer: Self-pay | Admitting: Nurse Practitioner

## 2019-12-23 DIAGNOSIS — K219 Gastro-esophageal reflux disease without esophagitis: Secondary | ICD-10-CM

## 2019-12-23 DIAGNOSIS — N529 Male erectile dysfunction, unspecified: Secondary | ICD-10-CM

## 2019-12-23 MED FILL — ?CARVEDILOL 3.125 MG TABLET: 3.125 | 30 days supply | Qty: 60 | Fill #2

## 2019-12-23 MED FILL — TAMSULOSIN HCL 0.4 MG CAP: 0.4 | 30 days supply | Qty: 30 | Fill #10

## 2019-12-24 ENCOUNTER — Other Ambulatory Visit: Payer: Self-pay | Admitting: Nurse Practitioner

## 2019-12-24 DIAGNOSIS — F329 Major depressive disorder, single episode, unspecified: Secondary | ICD-10-CM

## 2019-12-24 DIAGNOSIS — F419 Anxiety disorder, unspecified: Secondary | ICD-10-CM

## 2019-12-24 MED ORDER — TADALAFIL 5 MG PO TABS: 5.0000 mg | ORAL_TABLET | Freq: Every day | ORAL | 0 refills | Status: DC

## 2019-12-24 MED FILL — TADALAFIL 5 MG TABS: 5 | 30 days supply | Qty: 10 | Fill #0

## 2019-12-24 MED FILL — hydrOXYzine HCL 50 MG TABS: 50 | 20 days supply | Qty: 60 | Fill #0

## 2019-12-24 MED FILL — ?PANTOPRAZOLE SO DR 40MG TA: 40 | 30 days supply | Qty: 60 | Fill #0

## 2019-12-28 ENCOUNTER — Ambulatory Visit: Payer: Self-pay | Admitting: Nurse Practitioner

## 2019-12-28 ENCOUNTER — Other Ambulatory Visit: Payer: Self-pay

## 2019-12-30 ENCOUNTER — Encounter: Payer: Self-pay | Admitting: Nurse Practitioner

## 2019-12-30 ENCOUNTER — Other Ambulatory Visit: Payer: Self-pay

## 2019-12-30 ENCOUNTER — Ambulatory Visit: Payer: Self-pay | Attending: Nurse Practitioner | Admitting: Nurse Practitioner

## 2019-12-30 DIAGNOSIS — K219 Gastro-esophageal reflux disease without esophagitis: Secondary | ICD-10-CM

## 2019-12-30 DIAGNOSIS — F419 Anxiety disorder, unspecified: Secondary | ICD-10-CM

## 2019-12-30 DIAGNOSIS — F329 Major depressive disorder, single episode, unspecified: Secondary | ICD-10-CM

## 2019-12-30 NOTE — Progress Notes (Signed)
Virtual Visit via Telephone Note Due to national recommendations of social distancing due to [place] 19, telehealth visit is felt to be most appropriate for this patient at this time.  I discussed the limitations, risks, security and privacy concerns of performing an evaluation and management service by telephone and the availability of in person appointments. I also discussed with the patient that there may be a patient responsible charge related to this service. The patient expressed understanding and agreed to proceed.    I connected with Levi Alvarez on 12/30/19  at   2:10 PM EST  EDT by telephone and verified that I am speaking with the correct person using two identifiers.   Consent I discussed the limitations, risks, security and privacy concerns of performing an evaluation and management service by telephone and the availability of in person appointments. I also discussed with the patient that there may be a patient responsible charge related to this service. The patient expressed understanding and agreed to proceed.   Location of Patient: Private Residence   Location of Provider: [place] and [employer] Office    Persons participating in Telemedicine visit: Geryl Rankins FNP-BC [place]    History of Present Illness: Telemedicine visit for: Follow Up  has a past medical history of Alcoholism ([place]), Blood transfusion without reported diagnosis, Cirrhosis ([place]), Constipation, COPD (chronic obstructive pulmonary disease) ([place]), DDD (degenerative disc disease), cervical, ED (erectile dysfunction), Esophageal varices ([place]), GERD (gastroesophageal reflux disease), H/O: upper GI bleed, Hepatitis C antibody positive in blood, High blood pressure, History of anemia, Left inguinal hernia (2020), Lung nodule, Sleep apnea, Substance abuse ([place]), and Umbilical hernia ([id number]).   Doing well. Has no concerns or issues today. Trying to abstain from alcohol. Still  smoking (1/2 ppd). Seeing Dr. Havery Moros for  liver cirrhosis related to alcohol use and history of hep C (treated).   Essential Hypertension Taking coreg 3.125 mg BID, spironolactone 50 mg daily. Denies chest pain, shortness of breath, palpitations, lightheadedness, dizziness, headaches or BLE edema.  BP Readings from Last 3 Encounters:  12/11/19 140/88  11/03/19 (!) 156/78  09/25/19 121/73     Past Medical History:  Diagnosis Date  . Alcoholism ([place])   . Blood transfusion without reported diagnosis    jan, 2020 3 units PRBC  . Cirrhosis ([place])    secondary to alcohol and hep C  . Constipation    Resolved  . COPD (chronic obstructive pulmonary disease) ([place])   . DDD (degenerative disc disease), cervical   . ED (erectile dysfunction)   . Esophageal varices ([place])   . GERD (gastroesophageal reflux disease)   . H/O: upper GI bleed   . Hepatitis C antibody positive in blood    resolved  . High blood pressure   . History of anemia   . Left inguinal hernia 2020  . Lung nodule    Small  . Sleep apnea    in process of getting CPAP  . Substance abuse ([place])    was an alcoholic  . Umbilical hernia [id number]    Past Surgical History:  Procedure Laterality Date  . arm surgery     left arm,  . COLONOSCOPY WITH PROPOFOL N/A 01/19/2019   Procedure: COLONOSCOPY WITH PROPOFOL;  Surgeon: Yetta Flock, MD;  [facility];  Service: Gastroenterology;  Laterality: N/A;  . ESOPHAGEAL BANDING  11/25/2018   Procedure: ESOPHAGEAL BANDING;  Surgeon: Milus Banister, MD;  [facility];  Service: Endoscopy;;  .  ESOPHAGEAL BANDING  01/19/2019   Procedure: ESOPHAGEAL BANDING;  Surgeon: Yetta Flock, MD;  [facility];  Service: Gastroenterology;;  . ESOPHAGOGASTRODUODENOSCOPY (EGD) WITH PROPOFOL N/A 11/25/2018   Procedure: ESOPHAGOGASTRODUODENOSCOPY (EGD) WITH PROPOFOL;  Surgeon: Milus Banister, MD;  [facility];  Service: Endoscopy;  Laterality: N/A;   . ESOPHAGOGASTRODUODENOSCOPY (EGD) WITH PROPOFOL N/A 01/19/2019   Procedure: ESOPHAGOGASTRODUODENOSCOPY (EGD) WITH PROPOFOL;  Surgeon: Yetta Flock, MD;  [facility];  Service: Gastroenterology;  Laterality: N/A;  . ESOPHAGOGASTRODUODENOSCOPY (EGD) WITH PROPOFOL N/A 04/28/2019   Procedure: ESOPHAGOGASTRODUODENOSCOPY (EGD) WITH PROPOFOL;  Surgeon: Yetta Flock, MD;  [facility];  Service: Gastroenterology;  Laterality: N/A;  . ESOPHAGOGASTRODUODENOSCOPY (EGD) WITH PROPOFOL N/A 11/03/2019   Procedure: ESOPHAGOGASTRODUODENOSCOPY (EGD) WITH PROPOFOL;  Surgeon: Doran Stabler, MD;  [facility];  Service: Gastroenterology;  Laterality: N/A;  . KNEE SURGERY Left   . POLYPECTOMY  01/19/2019   Procedure: POLYPECTOMY;  Surgeon: Yetta Flock, MD;  [facility];  Service: Gastroenterology;;    Family History  Problem Relation Age of Onset  . Colitis Mother   . Arthritis Mother   . Cancer Father        lymphoma?  . Cancer Paternal Grandfather        bone  . Heart disease Neg Hx   . Stroke Neg Hx   . Diabetes Neg Hx     Social History   Socioeconomic History  . Marital status: Divorced    Spouse name: Not on file  . Number of children: Not on file  . Years of education: Not on file  . Highest education level: Not on file  Occupational History  . Not on file  Tobacco Use  . Smoking status: Current Every Day Smoker    Packs/day: 0.50    Years: 30.00    Pack years: 15.00    Types: Cigarettes  . Smokeless tobacco: Former Systems developer    Types: Chew  Substance and Sexual Activity  . Alcohol use: Not Currently    Comment: stopped in 11/2018  . Drug use: No  . Sexual activity: Yes    Partners: Female  Other Topics Concern  . Not on file  Social History Narrative  . Not on file   Social Determinants of Health   Financial Resource Strain:   . Difficulty of Paying Living Expenses: Not on file  Food Insecurity:   . Worried  About Charity fundraiser in the Last Year: Not on file  . Ran Out of Food in the Last Year: Not on file  Transportation Needs:   . Lack of Transportation (Medical): Not on file  . Lack of Transportation (Non-Medical): Not on file  Physical Activity:   . Days of Exercise per Week: Not on file  . Minutes of Exercise per Session: Not on file  Stress:   . Feeling of Stress : Not on file  Social Connections:   . Frequency of Communication with Friends and Family: Not on file  . Frequency of Social Gatherings with Friends and Family: Not on file  . Attends Religious Services: Not on file  . Active Member of Clubs or Organizations: Not on file  . Attends Archivist Meetings: Not on file  . Marital Status: Not on file     Observations/Objective: Awake, alert and oriented x 3   Review of Systems  Constitutional: Negative for fever, malaise/fatigue and weight loss.  HENT: Negative.  Negative for nosebleeds.  Eyes: Negative.  Negative for blurred vision, double vision and photophobia.  Respiratory: Negative.  Negative for cough and shortness of breath.   Cardiovascular: Negative.  Negative for chest pain, palpitations and leg swelling.  Gastrointestinal: Positive for heartburn. Negative for nausea and vomiting.  Musculoskeletal: Negative.  Negative for myalgias.  Neurological: Negative.  Negative for dizziness, focal weakness, seizures and headaches.  Psychiatric/Behavioral: Positive for depression. Negative for suicidal ideas. The patient is nervous/anxious.     Assessment and Plan: Jaxstin was seen today for follow-up.  Diagnoses and all orders for this visit:  Anxiety and depression -     hydrOXYzine (ATARAX/VISTARIL) 50 MG tablet; Take 1 tablet (50 mg total) by mouth 3 (three) times daily as needed for anxiety.  Gastroesophageal reflux disease without esophagitis -     pantoprazole (PROTONIX) 40 MG tablet; TAKE 1 TABLET BY MOUTH 2 (TWO) TIMES DAILY BEFORE A MEAL FOR 30  DAYS. INSTRUCTIONS: Avoid GERD Triggers: acidic, spicy or fried foods, caffeine, coffee, sodas,  alcohol and chocolate.    Follow Up Instructions Return in about 3 months (around 03/28/2020).     I discussed the assessment and treatment plan with the patient. The patient was provided an opportunity to ask questions and all were answered. The patient agreed with the plan and demonstrated an understanding of the instructions.   The patient was advised to call back or seek an in-person evaluation if the symptoms worsen or if the condition fails to improve as anticipated.  I provided 15 minutes of non-face-to-face time during this encounter including median intraservice time, reviewing previous notes, labs, imaging, medications and explaining diagnosis and management.  Gildardo Pounds, FNP-BC

## 2020-01-04 ENCOUNTER — Encounter: Payer: Self-pay | Admitting: Nurse Practitioner

## 2020-01-04 ENCOUNTER — Other Ambulatory Visit: Payer: Self-pay | Admitting: Nurse Practitioner

## 2020-01-04 DIAGNOSIS — Z130 Encounter for screening for diseases of the blood and blood-forming organs and certain disorders involving the immune mechanism: Secondary | ICD-10-CM

## 2020-01-04 DIAGNOSIS — E782 Mixed hyperlipidemia: Secondary | ICD-10-CM

## 2020-01-04 DIAGNOSIS — I10 Essential (primary) hypertension: Secondary | ICD-10-CM

## 2020-01-05 ENCOUNTER — Ambulatory Visit: Payer: Self-pay | Attending: Nurse Practitioner

## 2020-01-05 ENCOUNTER — Other Ambulatory Visit: Payer: Self-pay

## 2020-01-05 DIAGNOSIS — I10 Essential (primary) hypertension: Secondary | ICD-10-CM

## 2020-01-05 DIAGNOSIS — Z130 Encounter for screening for diseases of the blood and blood-forming organs and certain disorders involving the immune mechanism: Secondary | ICD-10-CM

## 2020-01-05 DIAGNOSIS — E782 Mixed hyperlipidemia: Secondary | ICD-10-CM

## 2020-01-06 LAB — LIPID PANEL
Chol/HDL Ratio: 2.6 ratio (ref 0.0–5.0)
Cholesterol, Total: 132 mg/dL (ref 100–199)
HDL: 51 mg/dL (ref >39)
LDL Chol Calc (NIH): 64 mg/dL (ref 0–99)
Triglycerides: 91 mg/dL (ref 0–149)
VLDL Cholesterol Cal: 17 mg/dL (ref 5–40)

## 2020-01-06 LAB — CBC
Hematocrit: 42 % (ref 37.5–51.0)
Hemoglobin: 14.7 g/dL (ref 13.0–17.7)
MCH: 33.1 pg — ABNORMAL HIGH (ref 26.6–33.0)
MCHC: 35 g/dL (ref 31.5–35.7)
MCV: 95 fL (ref 79–97)
Platelets: 83 10*3/uL — CL (ref 150–450)
RBC: 4.44 x10E6/uL (ref 4.14–5.80)
RDW: 13.6 % (ref 11.6–15.4)
WBC: 6.5 10*3/uL (ref 3.4–10.8)

## 2020-01-06 LAB — CMP14+EGFR
ALT: 32 IU/L (ref 0–44)
AST: 47 IU/L — ABNORMAL HIGH (ref 0–40)
Albumin/Globulin Ratio: 1.4 (ref 1.2–2.2)
Albumin: 3.9 g/dL — ABNORMAL LOW (ref 4.0–5.0)
Alkaline Phosphatase: 96 IU/L (ref 39–117)
BUN/Creatinine Ratio: 10 (ref 9–20)
BUN: 9 mg/dL (ref 6–24)
Bilirubin Total: 0.4 mg/dL (ref 0.0–1.2)
CO2: 23 mmol/L (ref 20–29)
Calcium: 9.2 mg/dL (ref 8.7–10.2)
Chloride: 104 mmol/L (ref 96–106)
Creatinine, Ser: 0.86 mg/dL (ref 0.76–1.27)
GFR calc Af Amer: 119 mL/min/{1.73_m2} (ref >59)
GFR calc non Af Amer: 103 mL/min/{1.73_m2} (ref >59)
Globulin, Total: 2.8 g/dL (ref 1.5–4.5)
Glucose: 134 mg/dL — ABNORMAL HIGH (ref 65–99)
Potassium: 4.7 mmol/L (ref 3.5–5.2)
Sodium: 142 mmol/L (ref 134–144)
Total Protein: 6.7 g/dL (ref 6.0–8.5)

## 2020-01-07 ENCOUNTER — Encounter: Payer: Self-pay | Admitting: Nurse Practitioner

## 2020-01-07 MED ORDER — PANTOPRAZOLE SODIUM 40 MG PO TBEC: DELAYED_RELEASE_TABLET | ORAL | 6 refills | Status: DC

## 2020-01-07 MED ORDER — HYDROXYZINE HCL 50 MG PO TABS: 50.0000 mg | ORAL_TABLET | Freq: Three times a day (TID) | ORAL | 2 refills | Status: AC | PRN

## 2020-02-03 ENCOUNTER — Other Ambulatory Visit: Payer: Self-pay | Admitting: Pharmacist

## 2020-02-03 DIAGNOSIS — N529 Male erectile dysfunction, unspecified: Secondary | ICD-10-CM

## 2020-02-03 MED ORDER — TADALAFIL 5 MG PO TABS: 5.0000 mg | ORAL_TABLET | Freq: Every day | ORAL | 1 refills | Status: DC

## 2020-02-03 MED FILL — ?CARVEDILOL 3.125 MG TABLET: 3.125 | 30 days supply | Qty: 60 | Fill #3

## 2020-02-03 MED FILL — ?PANTOPRAZOLE SO DR 40MG TA: 40 | 30 days supply | Qty: 60 | Fill #0

## 2020-02-04 MED FILL — TADALAFIL 5 MG TABS: 5 | 30 days supply | Qty: 10 | Fill #0

## 2020-02-05 ENCOUNTER — Other Ambulatory Visit: Payer: Self-pay | Admitting: Pharmacist

## 2020-02-05 MED ORDER — TAMSULOSIN HCL 0.4 MG PO CAPS: 0.4000 mg | ORAL_CAPSULE | Freq: Every day | ORAL | 2 refills | Status: DC

## 2020-02-05 MED FILL — TAMSULOSIN HCL 0.4 MG CAP: 0.4 | 30 days supply | Qty: 30 | Fill #0

## 2020-03-11 MED FILL — TAMSULOSIN HCL 0.4 MG CAP: 0.4 | 30 days supply | Qty: 30 | Fill #1

## 2020-03-11 MED FILL — ?PANTOPRAZOLE SO DR 40MG TA: 40 | 30 days supply | Qty: 60 | Fill #1

## 2020-03-11 MED FILL — ?CARVEDILOL 3.125 MG TABLET: 3.125 | 30 days supply | Qty: 60 | Fill #0

## 2020-03-12 ENCOUNTER — Other Ambulatory Visit: Payer: Self-pay

## 2020-03-12 DIAGNOSIS — K7460 Unspecified cirrhosis of liver: Secondary | ICD-10-CM

## 2020-03-12 NOTE — Progress Notes (Signed)
Orders for RUQ U/S and labs for [facility] screening

## 2020-03-14 ENCOUNTER — Telehealth: Payer: Self-pay

## 2020-03-14 NOTE — Telephone Encounter (Signed)
Pt scheduled for U/S on Thursday, 5-20 at 9:30am. To arrive at 9:15am.  NPO 6 hours. Needs labs.  Called and spoke to patient.  He was agreeable. Letter sent to MyChart as requested.

## 2020-03-14 NOTE — Progress Notes (Unsigned)
Letter sent to patient via MyChart.

## 2020-03-14 NOTE — Telephone Encounter (Signed)
-----   Message from Roetta Sessions, Adel sent at 12/11/2019  5:16 PM EST ----- Regarding: RUQ u/s and labs due in May 2021 RUQ ultrasound and labs due in May 2021  CBC, CMET, INR and AFP RUQ U/S - for [facility] screening

## 2020-03-21 ENCOUNTER — Encounter: Payer: Self-pay | Admitting: Nurse Practitioner

## 2020-03-25 ENCOUNTER — Other Ambulatory Visit: Payer: Self-pay | Admitting: Nurse Practitioner

## 2020-03-25 DIAGNOSIS — N529 Male erectile dysfunction, unspecified: Secondary | ICD-10-CM

## 2020-03-25 MED ORDER — TADALAFIL 20 MG PO TABS: 20.0000 mg | ORAL_TABLET | Freq: Every day | ORAL | 2 refills | Status: DC | PRN

## 2020-03-28 ENCOUNTER — Ambulatory Visit: Payer: Self-pay | Attending: Nurse Practitioner | Admitting: Nurse Practitioner

## 2020-03-28 ENCOUNTER — Encounter: Payer: Self-pay | Admitting: Nurse Practitioner

## 2020-03-28 ENCOUNTER — Other Ambulatory Visit: Payer: Self-pay

## 2020-03-28 VITALS — BP 164/86 | HR 67 | Temp 97.7°F | Ht 70.0 in | Wt 244.0 lb

## 2020-03-28 DIAGNOSIS — I10 Essential (primary) hypertension: Secondary | ICD-10-CM

## 2020-03-28 DIAGNOSIS — F17200 Nicotine dependence, unspecified, uncomplicated: Secondary | ICD-10-CM

## 2020-03-28 DIAGNOSIS — F331 Major depressive disorder, recurrent, moderate: Secondary | ICD-10-CM

## 2020-03-28 DIAGNOSIS — N529 Male erectile dysfunction, unspecified: Secondary | ICD-10-CM

## 2020-03-28 MED ORDER — CARVEDILOL 3.125 MG PO TABS: 3.1250 mg | ORAL_TABLET | Freq: Two times a day (BID) | ORAL | 3 refills | Status: DC

## 2020-03-28 MED ORDER — SPIRONOLACTONE 50 MG PO TABS: 50.0000 mg | ORAL_TABLET | Freq: Every day | ORAL | 1 refills | Status: DC

## 2020-03-28 MED ORDER — TAMSULOSIN HCL 0.4 MG PO CAPS: 0.4000 mg | ORAL_CAPSULE | Freq: Every day | ORAL | 2 refills | Status: DC

## 2020-03-28 MED ORDER — BUPROPION HCL ER (SR) 150 MG PO TB12: 150.0000 mg | ORAL_TABLET | Freq: Two times a day (BID) | ORAL | 2 refills | Status: DC

## 2020-03-28 NOTE — Progress Notes (Signed)
Assessment & Plan:  Levi Alvarez was seen today for hypertension.  Diagnoses and all orders for this visit:  Essential hypertension -     Discontinue: spironolactone (ALDACTONE) 50 MG tablet; Take 1 tablet (50 mg total) by mouth daily. -     carvedilol (COREG) 3.125 MG tablet; Take 1 tablet (3.125 mg total) by mouth 2 (two) times daily with a meal. TAKE 1 TABLET (3.125 MG TOTAL) BY MOUTH 2 (TWO) TIMES DAILY. -     spironolactone (ALDACTONE) 100 MG tablet; Take 1 tablet (100 mg total) by mouth daily. Continue all antihypertensives as prescribed.  Remember to bring in your blood pressure log with you for your follow up appointment.  DASH/Mediterranean Diets are healthier choices for HTN.    Tobacco dependence -     buPROPion (WELLBUTRIN SR) 150 MG 12 hr tablet; Take 1 tablet (150 mg total) by mouth 2 (two) times daily.  Erectile dysfunction, unspecified erectile dysfunction type -     tamsulosin (FLOMAX) 0.4 MG CAPS capsule; Take 1 capsule (0.4 mg total) by mouth daily.  Moderate episode of recurrent major depressive disorder (HCC) -     buPROPion (WELLBUTRIN SR) 150 MG 12 hr tablet; Take 1 tablet (150 mg total) by mouth 2 (two) times daily.    Patient has been counseled on age-appropriate routine health concerns for screening and prevention. These are reviewed and up-to-date. Referrals have been placed accordingly. Immunizations are up-to-date or declined.    Subjective:   Chief Complaint  Patient presents with  . Hypertension    Pt. is here for blood pressure check.    HPI Levi Alvarez 49 y.o. male presents to office today for blood pressure check.  H/O: HTN, heavy smoker, somewhat heavy drinker, hx/o chronic headaches, sleep problems, witnessed apnea, loud snoring, and daytime somnolence, Gastric ulcers, gastritis, chronic HEP C, Alcoholic/Hepatic cirrhosis (SEEING GI), esophageal varices,  GERD and COPD.  OSA; could not afford CPAP  He is very tearful today. States he is on  the edge. Trying hard to not start drinking again.  Daughter just left [place] to see a man she met on line. He is worried about her safety and she will not return his phone calls.  Best friend died just last year and he is still grieving that loss. Had several family member die over the past years, gf's father recently passed and 2 other friends recently gunned down. Concerned about his overall health.   Declines IP behavioral health evaluation but states he does need to see a psychiatrist as soon as posisble. Will start wellbutrin for depression and smoking cessation.   Depression screen [facility] 2/9 03/28/2020 12/30/2019 12/02/2018 09/26/2018 04/21/2018  Decreased Interest 1 0 0 1 0  Down, Depressed, Hopeless 3 0 0 1 2  PHQ - 2 Score 4 0 0 2 2  Altered sleeping 3 0 _0 Tired, decreased energy 1 0 1 2 0  Change in appetite 2 0 0 0 0  Feeling bad or failure about yourself  2 0 0 0 0  Trouble concentrating 2 0 0 0 0  Moving slowly or fidgety/restless 1 0 0 0 0  Suicidal thoughts 0 0 0 0 0  PHQ-9 Score 15 0 _1 Essential Hypertension Blood pressure is elevated today. He endorses adherence taking carvedilol 3.125 mg BID, spironolactone 50 mg daily. I will increase spironolactone to 100 mg. Denies chest pain.  BP Readings from Last 3  Encounters:  03/28/20 (!) 164/86  12/11/19 140/88  11/03/19 (!) 156/78    Review of Systems  Constitutional: Negative for fever, malaise/fatigue and weight loss.  HENT: Negative.  Negative for nosebleeds.   Eyes: Negative.  Negative for blurred vision, double vision and photophobia.  Respiratory: Negative.  Negative for cough and shortness of breath.   Cardiovascular: Negative.  Negative for chest pain, palpitations and leg swelling.  Gastrointestinal: Negative.  Negative for heartburn, nausea and vomiting.  Musculoskeletal: Negative.  Negative for myalgias.  Neurological: Negative.  Negative for dizziness, focal weakness, seizures and headaches.    Psychiatric/Behavioral: Positive for depression. Negative for suicidal ideas. The patient is nervous/anxious.     Past Medical History:  Diagnosis Date  . Alcoholism ([place])   . Blood transfusion without reported diagnosis    jan, 2020 3 units PRBC  . Cirrhosis ([place])    secondary to alcohol and hep C  . Constipation    Resolved  . COPD (chronic obstructive pulmonary disease) ([place])   . DDD (degenerative disc disease), cervical   . ED (erectile dysfunction)   . Esophageal varices ([place])   . GERD (gastroesophageal reflux disease)   . H/O: upper GI bleed   . Hepatitis C antibody positive in blood    resolved  . High blood pressure   . History of anemia   . Left inguinal hernia 2020  . Lung nodule    Small  . Sleep apnea    in process of getting CPAP  . Substance abuse ([place])    was an alcoholic  . Umbilical hernia 4315    Past Surgical History:  Procedure Laterality Date  . arm surgery     left arm,  . COLONOSCOPY WITH PROPOFOL N/A 01/19/2019   Procedure: COLONOSCOPY WITH PROPOFOL;  Surgeon: Yetta Flock, MD;  Location: [facility];  Service: Gastroenterology;  Laterality: N/A;  . ESOPHAGEAL BANDING  11/25/2018   Procedure: ESOPHAGEAL BANDING;  Surgeon: Milus Banister, MD;  Location: [facility];  Service: Endoscopy;;  . ESOPHAGEAL BANDING  01/19/2019   Procedure: ESOPHAGEAL BANDING;  Surgeon: Yetta Flock, MD;  Location: [facility];  Service: Gastroenterology;;  . ESOPHAGOGASTRODUODENOSCOPY (EGD) WITH PROPOFOL N/A 11/25/2018   Procedure: ESOPHAGOGASTRODUODENOSCOPY (EGD) WITH PROPOFOL;  Surgeon: Milus Banister, MD;  [facility];  Service: Endoscopy;  Laterality: N/A;  . ESOPHAGOGASTRODUODENOSCOPY (EGD) WITH PROPOFOL N/A 01/19/2019   Procedure: ESOPHAGOGASTRODUODENOSCOPY (EGD) WITH PROPOFOL;  Surgeon: Yetta Flock, MD;  [facility];  Service: Gastroenterology;  Laterality: N/A;  . ESOPHAGOGASTRODUODENOSCOPY (EGD) WITH PROPOFOL N/A  04/28/2019   Procedure: ESOPHAGOGASTRODUODENOSCOPY (EGD) WITH PROPOFOL;  Surgeon: Yetta Flock, MD;  [facility];  Service: Gastroenterology;  Laterality: N/A;  . ESOPHAGOGASTRODUODENOSCOPY (EGD) WITH PROPOFOL N/A 11/03/2019   Procedure: ESOPHAGOGASTRODUODENOSCOPY (EGD) WITH PROPOFOL;  Surgeon: Doran Stabler, MD;  [facility]: [facility];  Service: Gastroenterology;  Laterality: N/A;  . KNEE SURGERY Left   . POLYPECTOMY  01/19/2019   Procedure: POLYPECTOMY;  Surgeon: Yetta Flock, MD;  [facility];  Service: Gastroenterology;;    Family History  Problem Relation Age of Onset  . Colitis Mother   . Arthritis Mother   . Cancer Father        lymphoma?  . Cancer Paternal Grandfather        bone  . Heart disease Neg Hx   . Stroke Neg Hx   . Diabetes Neg Hx     Social History Reviewed with no changes to  be made today.   Outpatient Medications Prior to Visit  Medication Sig Dispense Refill  . acetaminophen (TYLENOL) 500 MG tablet Take 1,000 mg by mouth every 6 (six) hours as needed (pain.).    Marland Kitchen albuterol (PROVENTIL) (2.5 MG/3ML) 0.083% nebulizer solution Take 3 mLs (2.5 mg total) by nebulization every 6 (six) hours as needed for wheezing or shortness of breath. 150 mL prn  . fluticasone furoate-vilanterol (BREO ELLIPTA) 200-25 MCG/INH AEPB Inhale 1 puff into the lungs daily. 180 each 1  . loratadine (CLARITIN) 10 MG tablet Take 10 mg by mouth daily as needed for allergies.     . Milk Thistle 1000 MG CAPS Take 1,000 mg by mouth 2 (two) times daily.    . Multiple Vitamin (MULTIVITAMIN WITH MINERALS) TABS tablet Take 1 tablet by mouth daily.    . pantoprazole (PROTONIX) 40 MG tablet TAKE 1 TABLET BY MOUTH 2 (TWO) TIMES DAILY BEFORE A MEAL FOR 30 DAYS. 60 tablet 6  . tadalafil (CIALIS) 20 MG tablet Take 1 tablet (20 mg total) by mouth daily as needed for erectile dysfunction. 10 tablet 2  . VENTOLIN HFA 108 (90 Base) MCG/ACT inhaler INHALE 1 PUFF EVERY 6  (SIX) HOURS AS NEEDED INTO THE LUNGS FOR WHEEZING OR SHORTNESS OF BREATH. (Patient taking differently: Inhale 1 puff into the lungs every 6 (six) hours as needed for wheezing or shortness of breath. ) 18 g 1  . tamsulosin (FLOMAX) 0.4 MG CAPS capsule Take 1 capsule (0.4 mg total) by mouth daily. 30 capsule 2  . carvedilol (COREG) 3.125 MG tablet Take 1 tablet (3.125 mg total) by mouth 2 (two) times daily with a meal. TAKE 1 TABLET (3.125 MG TOTAL) BY MOUTH 2 (TWO) TIMES DAILY. 60 tablet 3  . spironolactone (ALDACTONE) 25 MG tablet Take 2 tablets (50 mg total) by mouth daily. (Patient taking differently: Take 25 mg by mouth daily. ) 90 tablet 1   Facility-Administered Medications Prior to Visit  Medication Dose Route Frequency Provider Last Rate Last Admin  . 0.9 %  sodium chloride infusion  500 mL Intravenous Continuous Armbruster, Carlota Raspberry, MD          Allergies  Allergen Reactions  . Naproxen Hives and Swelling       Objective:    BP (!) 164/86 (BP Location: Left Arm, Patient Position: Sitting, Cuff Size: Normal)   Pulse 67   Temp 97.7 F (36.5 C) (Temporal)   Ht _0  (1.778 m)   Wt 244 lb (110.7 kg)   SpO2 98%   BMI 35.01 kg/m  Wt Readings from Last 3 Encounters:  03/28/20 244 lb (110.7 kg)  12/11/19 238 lb 6.4 oz (108.1 kg)  11/03/19 229 lb 4.5 oz (104 kg)    Physical Exam Vitals and nursing note reviewed.  Constitutional:      Appearance: He is well-developed.  HENT:     Head: Normocephalic and atraumatic.  Cardiovascular:     Rate and Rhythm: Normal rate and regular rhythm.     Heart sounds: Normal heart sounds. No murmur. No friction rub. No gallop.   Pulmonary:     Effort: Pulmonary effort is normal. No tachypnea or respiratory distress.     Breath sounds: Normal breath sounds. No decreased breath sounds, wheezing, rhonchi or rales.  Chest:     Chest wall: No tenderness.  Abdominal:     General: Bowel sounds are normal.     Palpations: Abdomen is soft.    Musculoskeletal:  General: Normal range of motion.     Cervical back: Normal range of motion.  Skin:    General: Skin is warm and dry.  Neurological:     Mental Status: He is alert and oriented to person, place, and time.     Coordination: Coordination normal.  Psychiatric:        Behavior: Behavior normal. Behavior is cooperative.        Thought Content: Thought content normal.        Judgment: Judgment normal.          Patient has been counseled extensively about nutrition and exercise as well as the importance of adherence with medications and regular follow-up. The patient was given clear instructions to go to ER or return to medical center if symptoms don't improve, worsen or new problems develop. The patient verbalized understanding.   Follow-up: Return in about 4 weeks (around 04/25/2020) for depression , BP recheck, labs.   Gildardo Pounds, FNP-BC [facility] [place], [place]

## 2020-03-30 ENCOUNTER — Institutional Professional Consult (permissible substitution): Payer: Medicaid Other | Admitting: Licensed Clinical Social Worker

## 2020-03-31 ENCOUNTER — Ambulatory Visit (HOSPITAL_COMMUNITY): Admission: RE | Admit: 2020-03-31 | Payer: Self-pay | Source: Ambulatory Visit

## 2020-04-04 ENCOUNTER — Other Ambulatory Visit: Payer: Self-pay

## 2020-04-04 ENCOUNTER — Ambulatory Visit: Payer: Self-pay | Attending: Nurse Practitioner | Admitting: Licensed Clinical Social Worker

## 2020-04-04 DIAGNOSIS — F1020 Alcohol dependence, uncomplicated: Secondary | ICD-10-CM

## 2020-04-04 DIAGNOSIS — F411 Generalized anxiety disorder: Secondary | ICD-10-CM

## 2020-04-04 DIAGNOSIS — F331 Major depressive disorder, recurrent, moderate: Secondary | ICD-10-CM

## 2020-04-04 NOTE — BH Specialist Note (Signed)
Integrated Behavioral Health Initial Visit  MRN: [id number] Name: Levi Alvarez  Number of [place] Clinician visits:: 1/6 Session Start time: 8:45 AM  Session End time: 9:10 AM Total time: 25   Type of Service: [place] Interpretor:No. Interpretor Name and Language: NA   SUBJECTIVE: Levi Alvarez is a 49 y.o. male accompanied by self Patient was referred by NP Raul Del for depression, anxiety, and substance use. Patient reports the following symptoms/concerns: Pt reports difficulty managing psychosocial stressors, in addition, to medical health conditions resulting in chronic pain Duration of problem: Ongoing; Severity of problem: moderate  OBJECTIVE: Mood: Anxious and Depressed and Affect: Appropriate and Tearful Risk of harm to self or others: No plan to harm self or others  LIFE CONTEXT: Family and Social: Pt receives support from family School/Work: Pt has a pending disability case with assistance from a Occupational hygienist. Pt has obtained a [employer] Self-Care: Pt reports ongoing substance use (alcohol); however, states he has significantly decreased use since last year Life Changes: Pt has difficulty managing physical and mental health conditions  GOALS ADDRESSED: Patient will: 1. Reduce symptoms of: anxiety and depression 2. Increase knowledge and/or ability of: coping skills and healthy habits  3. Demonstrate ability to: Increase healthy adjustment to current life circumstances, Increase adequate support systems for patient/family and Decrease self-medicating behaviors  INTERVENTIONS: Interventions utilized: Solution-Focused Strategies, Supportive Counseling, Psychoeducation and/or Health Education and Link to [employer]  Standardized Assessments completed: Not Needed  ASSESSMENT: Patient currently experiencing depression and anxiety triggered by psychosocial stressors. Pt experiences chronic  pain, decreased ability to sleep, low appetite, racing thoughts, and increase in irritability. Denied SI/HI.   Patient may benefit from therapy and medication management. LCSW discussed correlation between one's physical and mental health, in addition, to how substance use can negatively impact both. Pt was successful in identifying healthier coping skills and is open to initiating therapy.   Resources for therapy and medication management provided.   PLAN: 1. Follow up with behavioral health clinician on : 04/18/2020 2. Behavioral recommendations: Utilize healthy coping skills discussed and resources provided 3. Referral(s): [place] (LME/Outside Clinic) 4. "From scale of 1-10, how likely are you to follow plan?":   Rebekah [place], LCSW 04/04/20 9:42 AM

## 2020-04-06 ENCOUNTER — Ambulatory Visit (HOSPITAL_COMMUNITY)
Admission: RE | Admit: 2020-04-06 | Discharge: 2020-04-06 | Disposition: A | Discharge status: Home / Self Care | Payer: Medicare Other | Source: Ambulatory Visit | Attending: Gastroenterology | Admitting: Gastroenterology

## 2020-04-06 ENCOUNTER — Other Ambulatory Visit: Payer: Self-pay

## 2020-04-06 DIAGNOSIS — K7460 Unspecified cirrhosis of liver: Secondary | ICD-10-CM | POA: Diagnosis not present

## 2020-04-09 ENCOUNTER — Encounter: Payer: Self-pay | Admitting: Nurse Practitioner

## 2020-04-09 MED ORDER — SPIRONOLACTONE 100 MG PO TABS: 100.0000 mg | ORAL_TABLET | Freq: Every day | ORAL | 0 refills | Status: DC

## 2020-04-12 MED FILL — SPIRONOLACTONE 100 MG TAB: 100 | 30 days supply | Qty: 30 | Fill #0

## 2020-04-15 MED FILL — ?CARVEDILOL 3.125 MG TABLET: 3.125 | 30 days supply | Qty: 60 | Fill #1

## 2020-04-15 MED FILL — ?PANTOPRAZOLE SO DR 40MG TA: 40 | 30 days supply | Qty: 60 | Fill #2

## 2020-04-15 MED FILL — TAMSULOSIN HCL 0.4 MG CAP: 0.4 | 30 days supply | Qty: 30 | Fill #2

## 2020-04-18 ENCOUNTER — Ambulatory Visit: Payer: Medicaid Other | Admitting: Licensed Clinical Social Worker

## 2020-04-19 MED FILL — ?TADALAFIL (PAH) 20 MG TAB: 20 | 30 days supply | Qty: 10 | Fill #1

## 2020-04-20 NOTE — Progress Notes (Signed)
Spoke to patient and informed on medication changes.  Pt. Understood.

## 2020-05-17 ENCOUNTER — Other Ambulatory Visit: Payer: Self-pay | Admitting: Nurse Practitioner

## 2020-05-17 ENCOUNTER — Encounter (HOSPITAL_COMMUNITY): Payer: Self-pay

## 2020-05-17 ENCOUNTER — Emergency Department (HOSPITAL_COMMUNITY)
Admission: EM | Admit: 2020-05-17 | Discharge: 2020-05-18 | Disposition: A | Discharge status: Home / Self Care | Payer: Medicare Other | Attending: Emergency Medicine | Admitting: Emergency Medicine

## 2020-05-17 DIAGNOSIS — F17218 Nicotine dependence, cigarettes, with other nicotine-induced disorders: Secondary | ICD-10-CM

## 2020-05-17 DIAGNOSIS — R1012 Left upper quadrant pain: Secondary | ICD-10-CM | POA: Diagnosis not present

## 2020-05-17 DIAGNOSIS — Z5321 Procedure and treatment not carried out due to patient leaving prior to being seen by health care provider: Secondary | ICD-10-CM | POA: Insufficient documentation

## 2020-05-17 MED ORDER — SODIUM CHLORIDE 0.9% FLUSH: 3.0000 mL | Freq: Once | INTRAVENOUS | Status: DC

## 2020-05-17 MED FILL — ?CARVEDILOL 3.125 MG TABLET: 3.125 | 30 days supply | Qty: 60 | Fill #2

## 2020-05-17 MED FILL — !BREO ELLIPTA 200-25 MCG: 200-25 | 30 days supply | Qty: 60 | Fill #1

## 2020-05-17 MED FILL — TAMSULOSIN HCL 0.4 MG CAP: 0.4 | 30 days supply | Qty: 30 | Fill #0

## 2020-05-17 MED FILL — ?TADALAFIL (PAH) 20 MG TAB: 20 | 30 days supply | Qty: 10 | Fill #2

## 2020-05-17 MED FILL — ?PANTOPRAZOLE SO DR 40MG TA: 40 | 30 days supply | Qty: 60 | Fill #3

## 2020-05-17 NOTE — ED Triage Notes (Signed)
Pt bib [facility] w/ c/o 7/10 sharp LUQ abdominal pain. EMS reports pain has subsided en route. Pt denies n/v/d. EMS VS: HR 60 NSR, 97% RA, RR 16, BP 139/64.

## 2020-05-17 NOTE — ED Notes (Signed)
Pt stated that he was leaving due to the long wait time, IV removed.

## 2020-05-18 LAB — COMPREHENSIVE METABOLIC PANEL
ALT: 69 U/L — ABNORMAL HIGH (ref 0–44)
AST: 101 U/L — ABNORMAL HIGH (ref 15–41)
Albumin: 4.1 g/dL (ref 3.5–5.0)
Alkaline Phosphatase: 75 U/L (ref 38–126)
Anion gap: 13 (ref 5–15)
BUN: 6 mg/dL (ref 6–20)
CO2: 21 mmol/L — ABNORMAL LOW (ref 22–32)
Calcium: 8.9 mg/dL (ref 8.9–10.3)
Chloride: 101 mmol/L (ref 98–111)
Creatinine, Ser: 0.8 mg/dL (ref 0.61–1.24)
GFR calc Af Amer: >60 mL/min (ref >60)
GFR calc non Af Amer: >60 mL/min (ref >60)
Glucose, Bld: 112 mg/dL — ABNORMAL HIGH (ref 70–99)
Potassium: 3.6 mmol/L (ref 3.5–5.1)
Sodium: 135 mmol/L (ref 135–145)
Total Bilirubin: 1.1 mg/dL (ref 0.3–1.2)
Total Protein: 7.4 g/dL (ref 6.5–8.1)

## 2020-05-18 LAB — URINALYSIS, ROUTINE W REFLEX MICROSCOPIC
Bilirubin Urine: NEGATIVE
Glucose, UA: NEGATIVE mg/dL
Hgb urine dipstick: NEGATIVE
Ketones, ur: NEGATIVE mg/dL
Leukocytes,Ua: NEGATIVE
Nitrite: NEGATIVE
Protein, ur: NEGATIVE mg/dL
Specific Gravity, Urine: 1.013 (ref 1.005–1.030)
pH: 5 (ref 5.0–8.0)

## 2020-05-18 LAB — CBC
HCT: 44.3 % (ref 39.0–52.0)
Hemoglobin: 15.1 g/dL (ref 13.0–17.0)
MCH: 33.6 pg (ref 26.0–34.0)
MCHC: 34.1 g/dL (ref 30.0–36.0)
MCV: 98.7 fL (ref 80.0–100.0)
Platelets: 90 10*3/uL — ABNORMAL LOW (ref 150–400)
RBC: 4.49 MIL/uL (ref 4.22–5.81)
RDW: 14.8 % (ref 11.5–15.5)
WBC: 8.1 10*3/uL (ref 4.0–10.5)
nRBC: 0 % (ref 0.0–0.2)

## 2020-05-18 LAB — LIPASE, BLOOD: Lipase: 58 U/L — ABNORMAL HIGH (ref 11–51)

## 2020-05-20 MED FILL — ALBUTEROL SULFATE HFA 108 (: 108 (90 BAS | 25 days supply | Qty: 18 | Fill #0

## 2020-05-29 ENCOUNTER — Other Ambulatory Visit: Payer: Self-pay

## 2020-05-29 ENCOUNTER — Ambulatory Visit
Admission: RE | Admit: 2020-05-29 | Discharge: 2020-05-29 | Disposition: A | Discharge status: Home / Self Care | Payer: Medicaid Other | Source: Ambulatory Visit | Attending: Emergency Medicine | Admitting: Emergency Medicine

## 2020-05-29 VITALS — BP 172/98 | HR 64 | Temp 98.2°F | Resp 16

## 2020-05-29 DIAGNOSIS — H5789 Other specified disorders of eye and adnexa: Secondary | ICD-10-CM | POA: Diagnosis not present

## 2020-05-29 DIAGNOSIS — S0592XA Unspecified injury of left eye and orbit, initial encounter: Secondary | ICD-10-CM

## 2020-05-29 MED ORDER — OFLOXACIN 0.3 % OP SOLN: 1.0000 [drp] | Freq: Four times a day (QID) | OPHTHALMIC | 0 refills | Status: DC

## 2020-05-29 NOTE — ED Provider Notes (Signed)
[facility]   [id number] 05/29/20 Arrival Time: 8338  Chief Complaint  Patient presents with  . Eye Problem     SUBJECTIVE:  Levi Alvarez is a 49 y.o. male who presented to the urgent care for complaint of left eye redness, sensitive to light and irritation for the past 1 day.  Reported he was hit by a tree limb.  Has not tried any OTC medication.  Denies aggravating or alleviating factor.  Denies similar symptoms in the past.  Denies fever, chills, nausea, vomiting, eye pain, painful eye movements, halos, discharge, itching, vision changes, double vision, FB sensation, periorbital erythema.     Denies contact lens use.    ROS: As per HPI.  All other pertinent ROS negative.     Past Medical History:  Diagnosis Date  . Alcoholism ([place])   . Blood transfusion without reported diagnosis    jan, 2020 3 units PRBC  . Cirrhosis ([place])    secondary to alcohol and hep C  . Constipation    Resolved  . COPD (chronic obstructive pulmonary disease) ([place])   . DDD (degenerative disc disease), cervical   . ED (erectile dysfunction)   . Esophageal varices ([place])   . GERD (gastroesophageal reflux disease)   . H/O: upper GI bleed   . Hepatitis C antibody positive in blood    resolved  . High blood pressure   . History of anemia   . Left inguinal hernia 2020  . Lung nodule    Small  . Sleep apnea    in process of getting CPAP  . Substance abuse ([place])    was an alcoholic  . Umbilical hernia 2505   Past Surgical History:  Procedure Laterality Date  . arm surgery     left arm,  . COLONOSCOPY WITH PROPOFOL N/A 01/19/2019   Procedure: COLONOSCOPY WITH PROPOFOL;  Surgeon: Yetta Flock, MD;  [facility];  Service: Gastroenterology;  Laterality: N/A;  . ESOPHAGEAL BANDING  11/25/2018   Procedure: ESOPHAGEAL BANDING;  Surgeon: Milus Banister, MD;  [facility];  Service: Endoscopy;;  . ESOPHAGEAL BANDING  01/19/2019   Procedure: ESOPHAGEAL BANDING;   Surgeon: Yetta Flock, MD;  [facility];  Service: Gastroenterology;;  . ESOPHAGOGASTRODUODENOSCOPY (EGD) WITH PROPOFOL N/A 11/25/2018   Procedure: ESOPHAGOGASTRODUODENOSCOPY (EGD) WITH PROPOFOL;  Surgeon: Milus Banister, MD;  [facility];  Service: Endoscopy;  Laterality: N/A;  . ESOPHAGOGASTRODUODENOSCOPY (EGD) WITH PROPOFOL N/A 01/19/2019   Procedure: ESOPHAGOGASTRODUODENOSCOPY (EGD) WITH PROPOFOL;  Surgeon: Yetta Flock, MD;  [facility];  Service: Gastroenterology;  Laterality: N/A;  . ESOPHAGOGASTRODUODENOSCOPY (EGD) WITH PROPOFOL N/A 04/28/2019   Procedure: ESOPHAGOGASTRODUODENOSCOPY (EGD) WITH PROPOFOL;  Surgeon: Yetta Flock, MD;  [facility];  Service: Gastroenterology;  Laterality: N/A;  . ESOPHAGOGASTRODUODENOSCOPY (EGD) WITH PROPOFOL N/A 11/03/2019   Procedure: ESOPHAGOGASTRODUODENOSCOPY (EGD) WITH PROPOFOL;  Surgeon: Doran Stabler, MD;  [facility];  Service: Gastroenterology;  Laterality: N/A;  . KNEE SURGERY Left   . POLYPECTOMY  01/19/2019   Procedure: POLYPECTOMY;  Surgeon: Yetta Flock, MD;  [facility];  Service: Gastroenterology;;   Allergies  Allergen Reactions  . Naproxen Hives and Swelling   Current Facility-Administered Medications on File Prior to Encounter  Medication Dose Route Frequency Provider Last Rate Last Admin  . 0.9 %  sodium chloride infusion  500 mL Intravenous Continuous Armbruster, Carlota Raspberry, MD       Current Outpatient Medications on File Prior to  Encounter  Medication Sig Dispense Refill  . acetaminophen (TYLENOL) 500 MG tablet Take 1,000 mg by mouth every 6 (six) hours as needed (pain.).    Marland Kitchen albuterol (PROVENTIL) (2.5 MG/3ML) 0.083% nebulizer solution Take 3 mLs (2.5 mg total) by nebulization every 6 (six) hours as needed for wheezing or shortness of breath. 150 mL prn  . buPROPion (WELLBUTRIN SR) 150 MG 12 hr tablet Take 1 tablet (150 mg total) by  mouth 2 (two) times daily. 60 tablet 2  . carvedilol (COREG) 3.125 MG tablet Take 1 tablet (3.125 mg total) by mouth 2 (two) times daily with a meal. TAKE 1 TABLET (3.125 MG TOTAL) BY MOUTH 2 (TWO) TIMES DAILY. 60 tablet 3  . fluticasone furoate-vilanterol (BREO ELLIPTA) 200-25 MCG/INH AEPB Inhale 1 puff into the lungs daily. 180 each 1  . loratadine (CLARITIN) 10 MG tablet Take 10 mg by mouth daily as needed for allergies.     . Milk Thistle 1000 MG CAPS Take 1,000 mg by mouth 2 (two) times daily.    . Multiple Vitamin (MULTIVITAMIN WITH MINERALS) TABS tablet Take 1 tablet by mouth daily.    . pantoprazole (PROTONIX) 40 MG tablet TAKE 1 TABLET BY MOUTH 2 (TWO) TIMES DAILY BEFORE A MEAL FOR 30 DAYS. 60 tablet 6  . spironolactone (ALDACTONE) 100 MG tablet Take 1 tablet (100 mg total) by mouth daily. 90 tablet 0  . tadalafil (CIALIS) 20 MG tablet Take 1 tablet (20 mg total) by mouth daily as needed for erectile dysfunction. 10 tablet 2  . tamsulosin (FLOMAX) 0.4 MG CAPS capsule Take 1 capsule (0.4 mg total) by mouth daily. 30 capsule 2  . VENTOLIN HFA 108 (90 Base) MCG/ACT inhaler INHALE 1 PUFF EVERY 6 (SIX) HOURS AS NEEDED INTO THE LUNGS FOR WHEEZING OR SHORTNESS OF BREATH. 18 g 1   Social History   Socioeconomic History  . Marital status: Divorced    Spouse name: Not on file  . Number of children: Not on file  . Years of education: Not on file  . Highest education level: Not on file  Occupational History  . Not on file  Tobacco Use  . Smoking status: Current Every Day Smoker    Packs/day: 0.50    Years: 30.00    Pack years: 15.00    Types: Cigarettes  . Smokeless tobacco: Former Systems developer    Types: Secondary school teacher  . Vaping Use: Never used  Substance and Sexual Activity  . Alcohol use: Not Currently    Comment: stopped in 11/2018  . Drug use: No  . Sexual activity: Yes    Partners: Female  Other Topics Concern  . Not on file  Social History Narrative  . Not on file   Social  Determinants of Health   Financial Resource Strain:   . Difficulty of Paying Living Expenses:   Food Insecurity:   . Worried About Charity fundraiser in the Last Year:   . Arboriculturist in the Last Year:   Transportation Needs:   . Film/video editor (Medical):   Marland Kitchen Lack of Transportation (Non-Medical):   Physical Activity:   . Days of Exercise per Week:   . Minutes of Exercise per Session:   Stress:   . Feeling of Stress :   Social Connections:   . Frequency of Communication with Friends and Family:   . Frequency of Social Gatherings with Friends and Family:   . Attends Religious Services:   .  Active Member of Clubs or Organizations:   . Attends Archivist Meetings:   Marland Kitchen Marital Status:   Intimate Partner Violence:   . Fear of Current or Ex-Partner:   . Emotionally Abused:   Marland Kitchen Physically Abused:   . Sexually Abused:    Family History  Problem Relation Age of Onset  . Colitis Mother   . Arthritis Mother   . Cancer Father        lymphoma?  . Cancer Paternal Grandfather        bone  . Heart disease Neg Hx   . Stroke Neg Hx   . Diabetes Neg Hx     OBJECTIVE:    Visual Acuity  Right Eye Distance:   Left Eye Distance:   Bilateral Distance:    Right Eye Near:   Left Eye Near:    Bilateral Near:      Vitals:   05/29/20 1438  BP: (!) 172/98  Pulse: 64  Resp: 16  Temp: 98.2 F (36.8 C)  TempSrc: Oral  SpO2: 98%    Physical Exam Vitals and nursing note reviewed.  Constitutional:      General: He is not in acute distress.    Appearance: Normal appearance. He is normal weight. He is not ill-appearing, toxic-appearing or diaphoretic.  Eyes:     General: Lids are normal. Lids are everted, no foreign bodies appreciated. Vision grossly intact. Gaze aligned appropriately. No visual field deficit.       Left eye: No foreign body, discharge or hordeolum.     Conjunctiva/sclera:     Left eye: Hemorrhage present.  Cardiovascular:     Rate and  Rhythm: Normal rate and regular rhythm.     Pulses: Normal pulses.     Heart sounds: Normal heart sounds. No murmur heard.  No friction rub. No gallop.   Pulmonary:     Effort: Pulmonary effort is normal. No respiratory distress.     Breath sounds: Normal breath sounds. No stridor. No wheezing, rhonchi or rales.  Chest:     Chest wall: No tenderness.  Neurological:     Mental Status: He is alert and oriented to person, place, and time.      ASSESSMENT & PLAN:  1. Eye redness   2. Left eye injury, initial encounter     Meds ordered this encounter  Medications  . ofloxacin (OCUFLOX) 0.3 % ophthalmic solution    Sig: Place 1 drop into the left eye 4 (four) times daily.    Dispense:  5 mL    Refill:  0    Discharge Instructions  Use ofloxacin as prescribed and to completion Use OTC systane or genteal gel eye drops at night as needed for symptomatic relief Use OTC ibuprofen or tylenol as needed for pain relief Return here or follow up with ophthamolgy if symptoms persists or worsen such as fever, chills, redness, swelling, eye pain, painful eye movements, vision changes, etc...  Reviewed expectations re: course of current medical issues. Questions answered. Outlined signs and symptoms indicating need for more acute intervention. Patient verbalized understanding. After Visit Summary given.    Note: This document was prepared using Dragon voice recognition software and may include unintentional dictation errors.    Emerson Monte, FNP 05/29/20 1525

## 2020-05-29 NOTE — Discharge Instructions (Addendum)
Use ofloxacin as prescribed and to completion Use OTC systane or genteal gel eye drops at night as needed for symptomatic relief Use OTC ibuprofen or tylenol as needed for pain relief Return here or follow up with ophthamolgy if symptoms persists or worsen such as fever, chills, redness, swelling, eye pain, painful eye movements, vision changes, etc..... 

## 2020-05-29 NOTE — ED Triage Notes (Signed)
Tree limb hit his eye yesterday, now is having pain and blood in the LT eye. Blurry vision and sensitive to Light

## 2020-05-30 MED FILL — OFLOXACIN 0.3% EYE DROPS: 0.3 | 18 days supply | Qty: 5 | Fill #0

## 2020-05-30 MED FILL — VENLAFAXINE HCL ER 37.5 MG: 37.5 | 25 days supply | Qty: 43 | Fill #0

## 2020-06-07 MED FILL — DUREZOL 0.05% EYE DROPS: 0.05 | 25 days supply | Qty: 5 | Fill #0

## 2020-06-07 MED FILL — BESIVANCE 0.6% SUSP: 0.6 | 25 days supply | Qty: 5 | Fill #0

## 2020-06-14 ENCOUNTER — Other Ambulatory Visit: Payer: Self-pay | Admitting: Nurse Practitioner

## 2020-06-14 DIAGNOSIS — N529 Male erectile dysfunction, unspecified: Secondary | ICD-10-CM

## 2020-06-14 MED FILL — ?TADALAFIL (PAH) 20 MG TAB: 20 | 30 days supply | Qty: 10 | Fill #0

## 2020-06-14 MED FILL — TAMSULOSIN HCL 0.4 MG CAP: 0.4 | 30 days supply | Qty: 30 | Fill #1

## 2020-06-14 MED FILL — ?CARVEDILOL 3.125 MG TABLET: 3.125 | 30 days supply | Qty: 60 | Fill #3

## 2020-06-14 MED FILL — ?PANTOPRAZOLE SODI DR 40MGT: 40 | 30 days supply | Qty: 60 | Fill #4

## 2020-06-14 MED FILL — !BREO ELLIPTA 200-25 MCG: 200-25 | 30 days supply | Qty: 60 | Fill #2

## 2020-06-15 MED FILL — VENLAFAXINE HCL ER 75 MG CA: 75 | 30 days supply | Qty: 30 | Fill #0

## 2020-06-16 MED FILL — DUREZOL 0.05% EYE DROPS: 0.05 | 25 days supply | Qty: 5 | Fill #0

## 2020-06-16 MED FILL — BESIVANCE 0.6% SUSP: 0.6 | 25 days supply | Qty: 5 | Fill #0

## 2020-06-30 MED FILL — BESIVANCE 0.6% SUSP: 0.6 | 25 days supply | Qty: 5 | Fill #0

## 2020-06-30 MED FILL — DUREZOL 0.05% EYE DROPS: 0.05 | 25 days supply | Qty: 5 | Fill #0

## 2020-07-07 MED FILL — DUREZOL 0.05% EYE DROPS: 0.05 | 25 days supply | Qty: 5 | Fill #0

## 2020-07-07 MED FILL — BESIVANCE 0.6% SUSP: 0.6 | 25 days supply | Qty: 5 | Fill #0

## 2020-07-11 MED FILL — hydrOXYzine HCL 50 MG TABS: 50 | 30 days supply | Qty: 60 | Fill #0

## 2020-07-19 MED FILL — TAMSULOSIN HCL 0.4 MG CAP: 0.4 | 30 days supply | Qty: 30 | Fill #2

## 2020-07-19 MED FILL — ?CARVEDILOL 3.125 MG TABLET: 3.125 | 30 days supply | Qty: 60 | Fill #0

## 2020-07-19 MED FILL — PANTOPRAZOLE SOD DR 40 MG T: 40 | 30 days supply | Qty: 60 | Fill #5

## 2020-07-19 MED FILL — TADALAFIL 20 MG TABS: 20 | 30 days supply | Qty: 10 | Fill #0

## 2020-07-20 MED FILL — VENLAFAXINE HCL ER 75 MG CA: 75 | 30 days supply | Qty: 30 | Fill #0

## 2020-07-25 MED FILL — DUREZOL 0.05% EYE DROPS: 0.05 | 25 days supply | Qty: 5 | Fill #0

## 2020-07-25 MED FILL — BESIVANCE 0.6% SUSP: 0.6 | 25 days supply | Qty: 5 | Fill #0

## 2020-08-01 ENCOUNTER — Other Ambulatory Visit: Payer: Self-pay | Admitting: Family

## 2020-08-01 MED FILL — LOSARTAN POTASSIUM 50 MG TA: 50 | 30 days supply | Qty: 30 | Fill #0

## 2020-08-01 MED FILL — DICLOFENAC SODIUM 1% GEL: 1 | 30 days supply | Qty: 100 | Fill #0

## 2020-08-15 ENCOUNTER — Other Ambulatory Visit: Payer: Self-pay | Admitting: Psychiatry

## 2020-08-17 ENCOUNTER — Other Ambulatory Visit: Payer: Self-pay | Admitting: Nurse Practitioner

## 2020-08-17 DIAGNOSIS — N529 Male erectile dysfunction, unspecified: Secondary | ICD-10-CM

## 2020-08-24 MED FILL — VENLAFAXINE HCL ER 37.5 MG: 37.5 | 30 days supply | Qty: 30 | Fill #0

## 2020-08-24 MED FILL — BREO ELLIPTA 200-25 MCG INH: 200-25 | 30 days supply | Qty: 60 | Fill #0

## 2020-08-24 MED FILL — TAMSULOSIN HCL 0.4 MG CAP: 0.4 | 90 days supply | Qty: 90 | Fill #0

## 2020-08-24 MED FILL — PANTOPRAZOLE SOD DR 40 MG T: 40 | 30 days supply | Qty: 60 | Fill #6

## 2020-08-24 MED FILL — VENLAFAXINE HCL ER 75 MG CA: 75 | 30 days supply | Qty: 30 | Fill #0

## 2020-09-05 MED FILL — LOSARTAN POTASSIUM 50 MG TA: 50 | 30 days supply | Qty: 30 | Fill #1

## 2020-09-15 ENCOUNTER — Other Ambulatory Visit: Payer: Self-pay | Admitting: Family

## 2020-09-15 MED FILL — MONTELUKAST SOD 10 MG TAB: 10 | 90 days supply | Qty: 90 | Fill #0

## 2020-09-15 MED FILL — AMOX-CLAV 875-125 MG TABLET: 875-125 | 5 days supply | Qty: 10 | Fill #0

## 2020-09-15 MED FILL — FLUTICASONE PROP 50 MCG SPR: 50 | 30 days supply | Qty: 16 | Fill #0

## 2020-09-19 ENCOUNTER — Telehealth: Payer: Self-pay

## 2020-09-19 DIAGNOSIS — K7460 Unspecified cirrhosis of liver: Secondary | ICD-10-CM

## 2020-09-19 NOTE — Progress Notes (Signed)
Pt scheduled for liver U/S on Tuesday, 10-11-20 at 8:00am at [facility]

## 2020-09-19 NOTE — Telephone Encounter (Signed)
Scheduled pt for RUQ U/S for [place] screening for Tuesday, 11-30 at  8:00am, NPO after midnight. Called and spoke to pt.  He confirmed appt information. Information sent to pt via MyChart

## 2020-09-19 NOTE — Telephone Encounter (Signed)
-----   Message from Roetta Sessions, [place] sent at 04/07/2020  5:02 PM EDT ----- Regarding: RUQ ultrasound due 09-2020 RUQ U/S due for [facility] screening in late Nov.

## 2020-09-19 NOTE — Telephone Encounter (Signed)
-----   Message from Roetta Sessions, [place] sent at 04/07/2020  5:02 PM EDT ----- Regarding: RUQ ultrasound due 09-2020 RUQ U/S due for [facility] screening in late Nov.

## 2020-10-11 ENCOUNTER — Ambulatory Visit (HOSPITAL_COMMUNITY): Payer: Medicare Other

## 2020-10-14 ENCOUNTER — Ambulatory Visit (HOSPITAL_COMMUNITY)
Admission: RE | Admit: 2020-10-14 | Discharge: 2020-10-14 | Disposition: A | Discharge status: Home / Self Care | Payer: Medicare Other | Source: Ambulatory Visit | Attending: Gastroenterology | Admitting: Gastroenterology

## 2020-10-14 ENCOUNTER — Other Ambulatory Visit: Payer: Self-pay

## 2020-10-14 DIAGNOSIS — K7460 Unspecified cirrhosis of liver: Secondary | ICD-10-CM

## 2020-10-21 ENCOUNTER — Ambulatory Visit (HOSPITAL_COMMUNITY): Payer: Medicare Other

## 2020-10-23 IMAGING — MR MR CERVICAL SPINE W/O CM
4 of 5 series · 19 of 48 positions shown · non-contrast
Comparison: Radiographs 10/21/2018

CLINICAL DATA: Neck and upper back pain.

EXAM:
MRI CERVICAL SPINE WITHOUT CONTRAST
TECHNIQUE: Multiplanar, multisequence MR imaging of the cervical spine was
performed. No intravenous contrast was administered.

[Series 2: T2 · sagittal · 3.0mm · 0.43mm/px · 6 of 13 slices shown (1 of 2)]
[im 1/13]
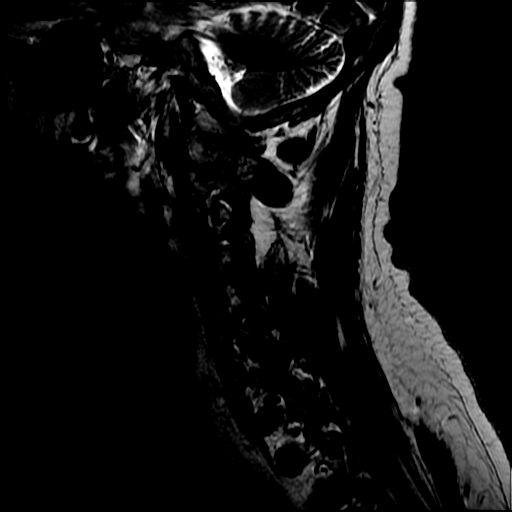
[im 3/13]
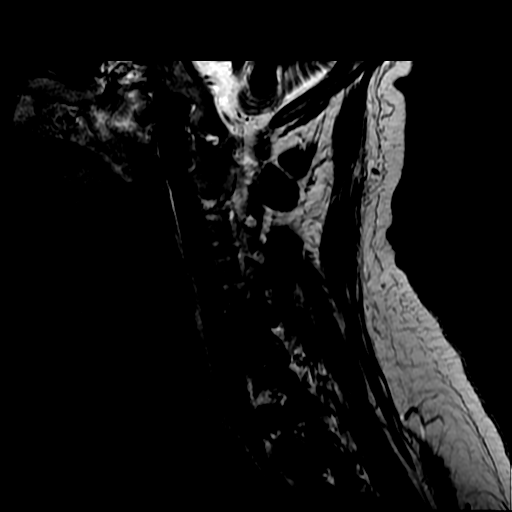
[im 5/13]
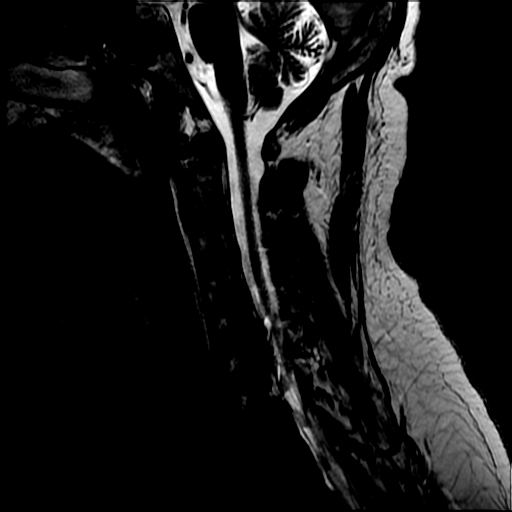
[im 8/13]
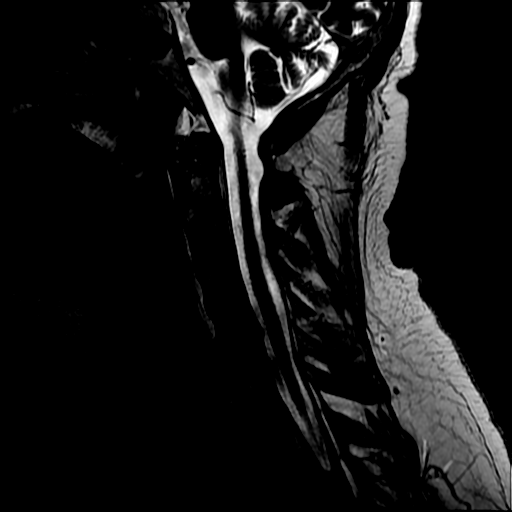
[im 10/13]
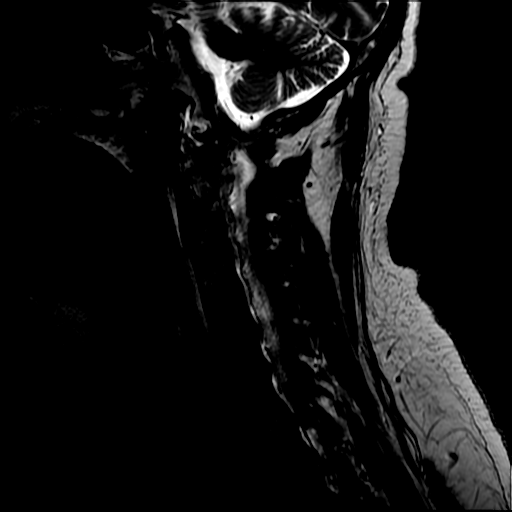
[im 13/13]
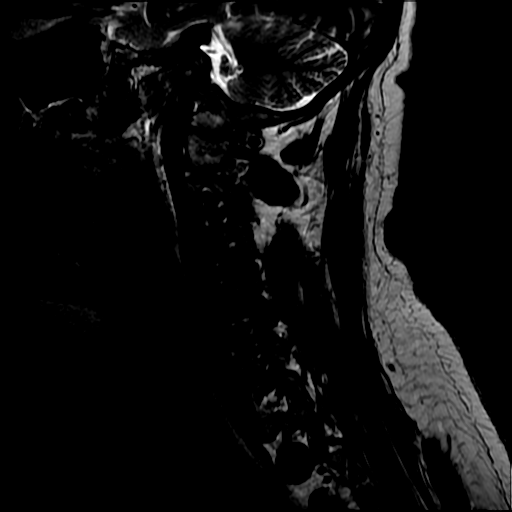

[Series 3: FLAIR · sagittal · 3.0mm · 0.43mm/px · 3 of 13 slices shown]
[im 1/13]
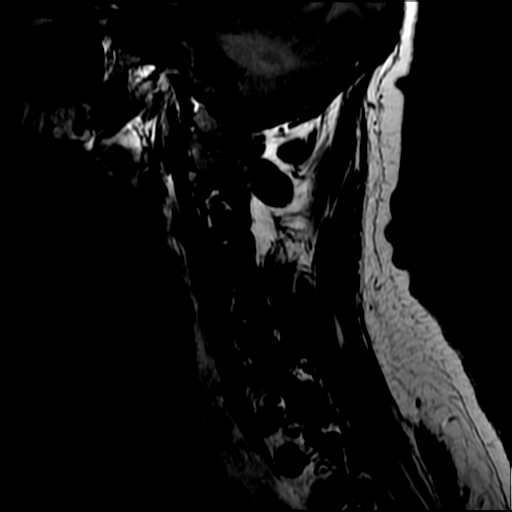
[im 7/13]
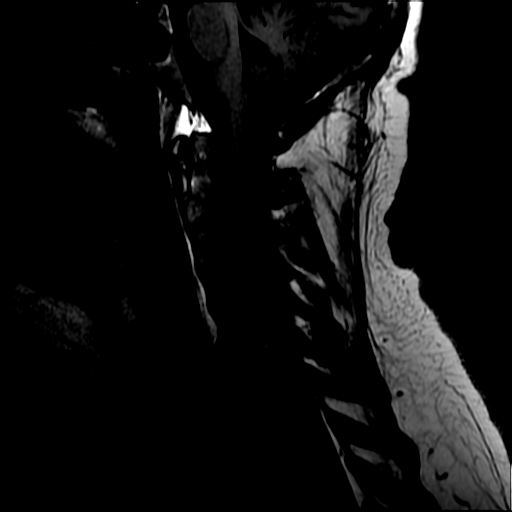
[im 13/13]
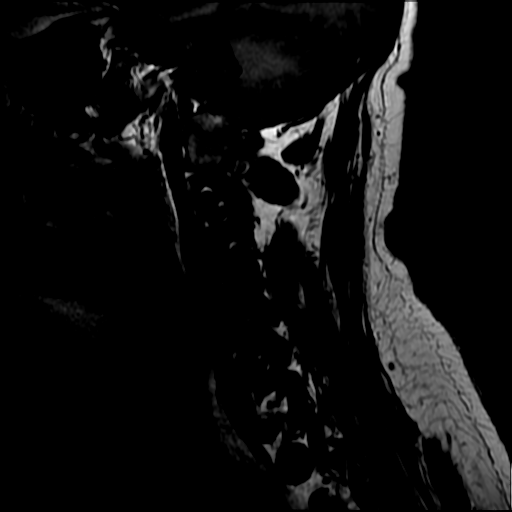

[Series 4: STIR · sagittal · 3.0mm · 0.43mm/px · 3 of 13 slices shown]
[im 1/13]
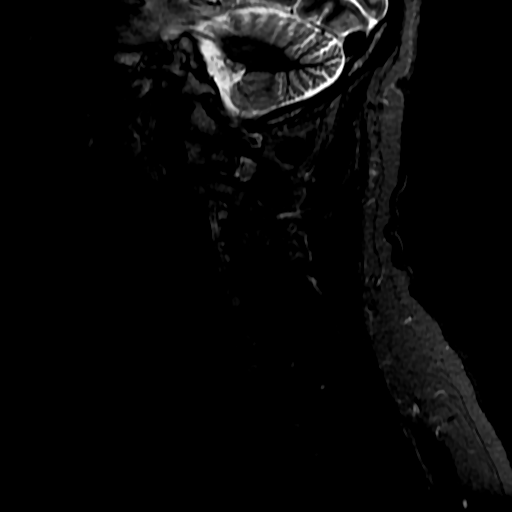
[im 7/13]
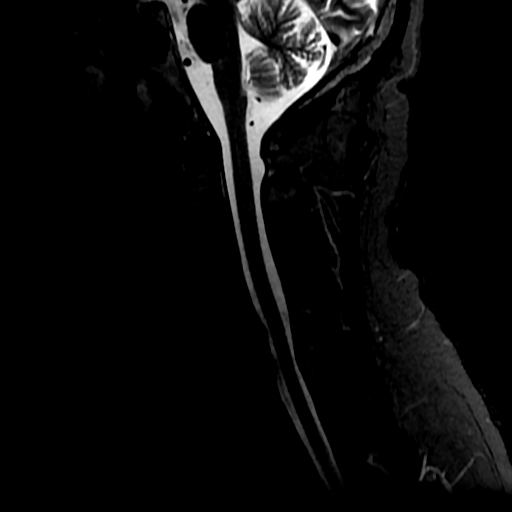
[im 13/13]
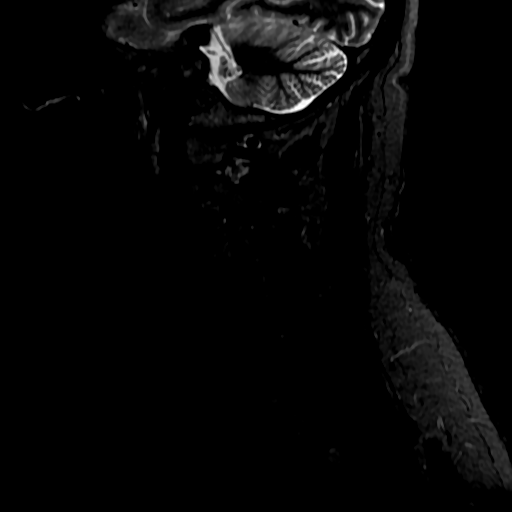

[Series 6: T2 · axial · 3.0mm · 0.35mm/px · z∈[-106,-4]mm · 7 of 36 slices shown (2 of 2)]
[im 1/36]
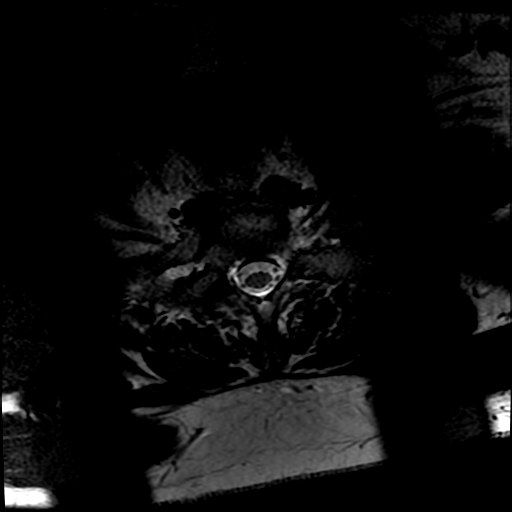
[im 6/36]
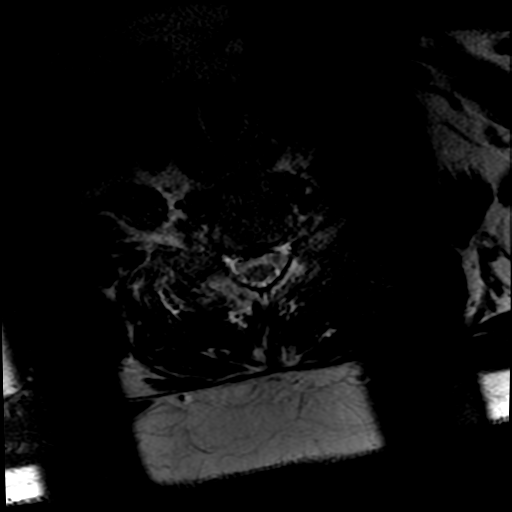
[im 11/36]
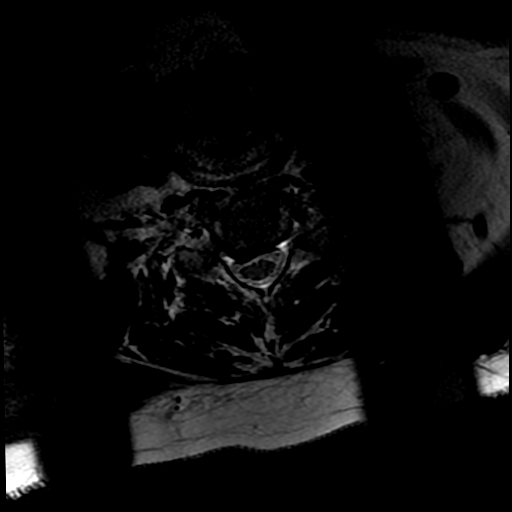
[im 16/36]
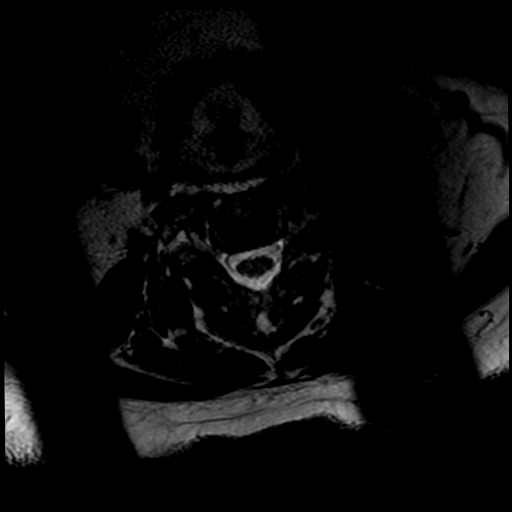
[im 18/36]
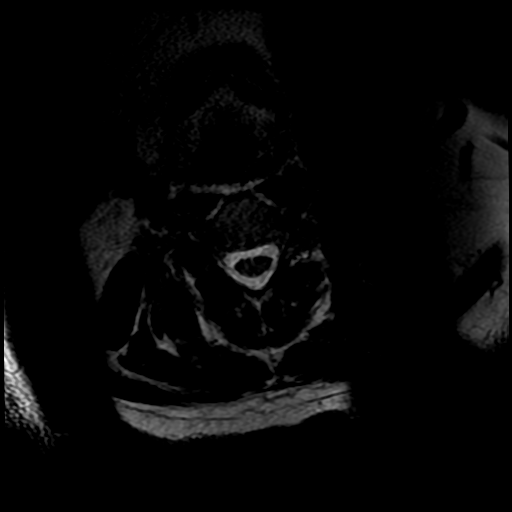
[im 21/36]
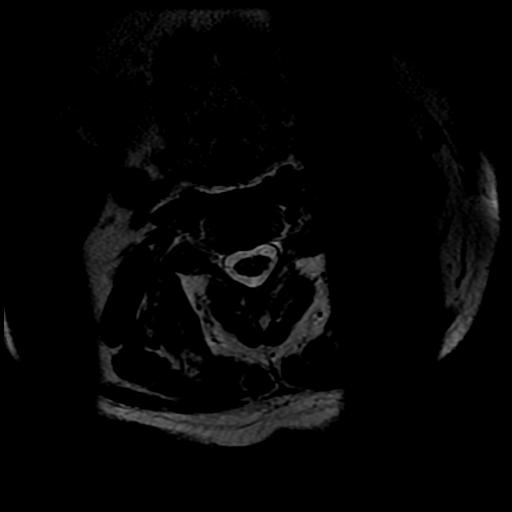
[im 31/36]
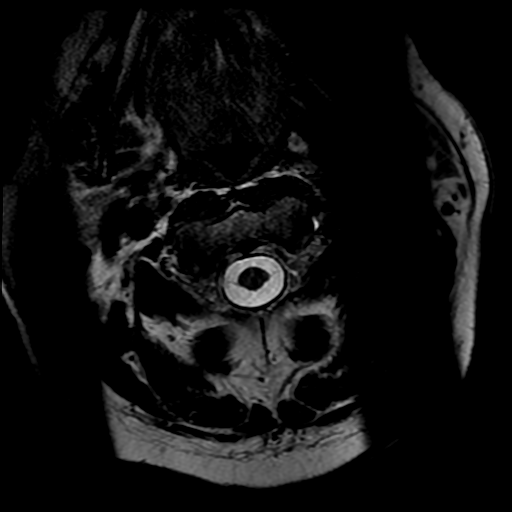

[19 of 48 positions shown; findings below may reference images not displayed]

FINDINGS: Alignment: Normal.

Vertebrae: Normal marrow signal.  No bone lesions or fractures.

Cord: Normal cord signal intensity.  No cord lesions or syrinx.

Posterior Fossa, vertebral arteries, paraspinal tissues: Mild
cerebellar atrophy suggested.

Disc levels:

C2-3: No significant findings.

C3-4: No significant findings.

C4-5: No significant findings.

C5-6: Shallow broad-based disc protrusion slightly asymmetric to the
right with mild flattening of the ventral thecal sac and slightly
narrowing of the ventral CSF space. There is also mild osteophytic
ridging and uncinate spurring with mild foraminal narrowing
bilaterally.

C6-7: Shallow central and slightly right paracentral disc protrusion
with mild mass effect on the ventral thecal sac and narrowing of the
ventral CSF space. There is also mild osteophytic ridging and
uncinate spurring with mild foraminal encroachment..

C7-T1: No significant findings.
IMPRESSION: 1. Degenerative disc disease at C5-6 and C6-7 with shallow
broad-based disc protrusions, osteophytic ridging and uncinate
spurring. Mild mass effect on the thecal sac and mild foraminal
narrowing bilaterally as detailed above.
2. No acute bony findings and normal appearance of the cervical
spinal cord.

## 2020-10-23 IMAGING — MR MR LUMBAR SPINE W/O CM
4 of 5 series · 18 of 48 positions shown · non-contrast
Comparison: CT scan 12/09/2017.

CLINICAL DATA: Chronic low back pain and left lower extremity
radiculopathy.

EXAM:
MRI LUMBAR SPINE WITHOUT CONTRAST
TECHNIQUE: Multiplanar, multisequence MR imaging of the lumbar spine was
performed. No intravenous contrast was administered.

[Series 3: T2 · sagittal · 4.0mm · 0.55mm/px · 5 of 13 slices shown (1 of 2)]
[im 1/13]
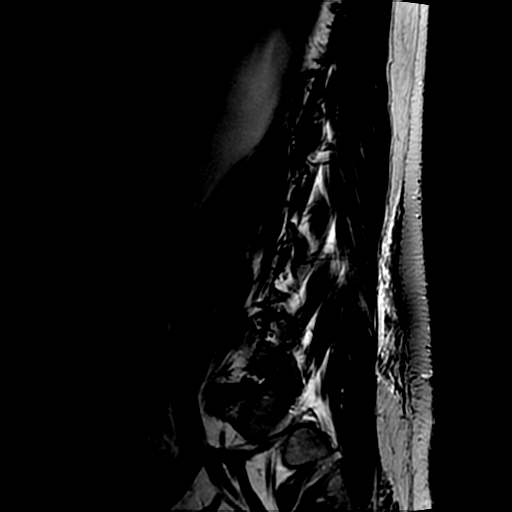
[im 4/13]
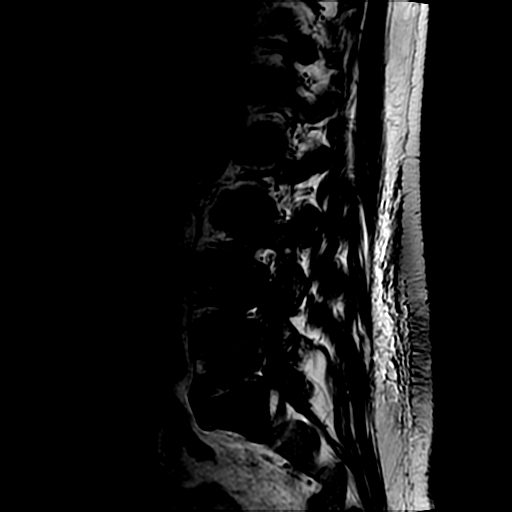
[im 7/13]
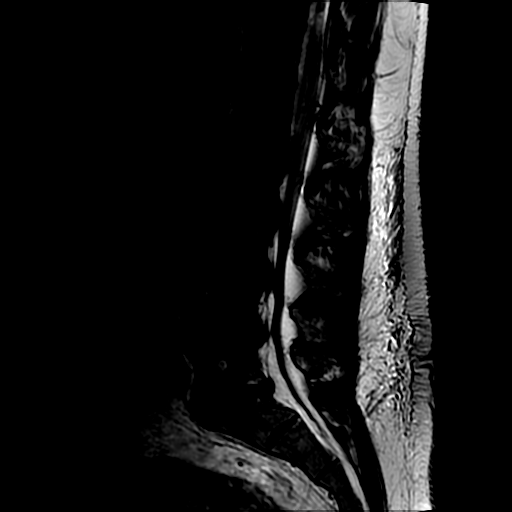
[im 10/13]
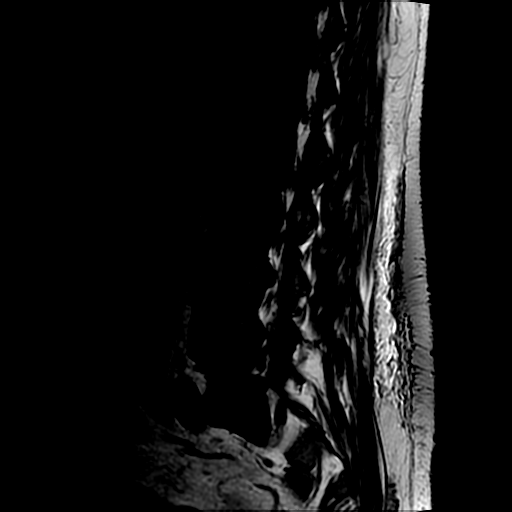
[im 13/13]
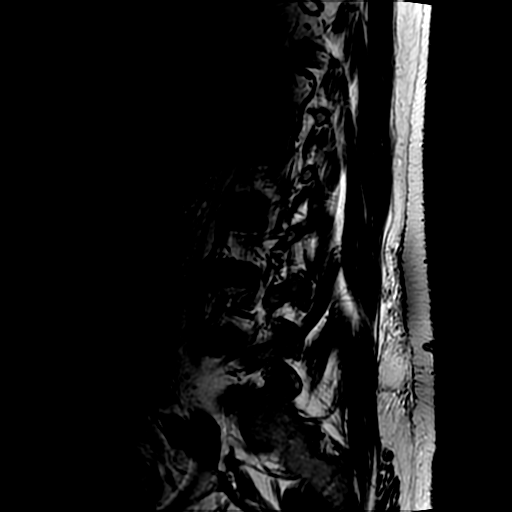

[Series 5: T1 · sagittal · 4.0mm · 0.55mm/px · 3 of 13 slices shown (1 of 2)]
[im 3/13]
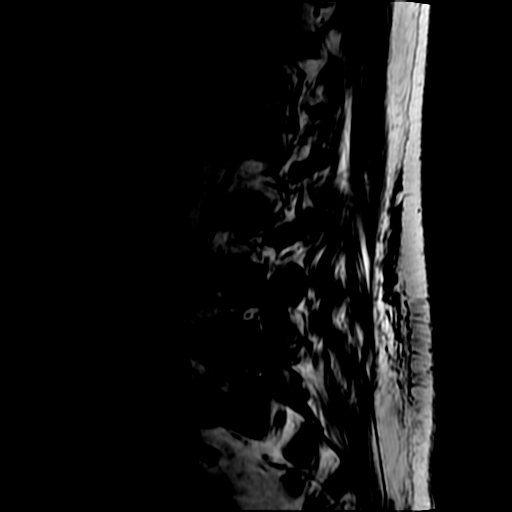
[im 8/13]
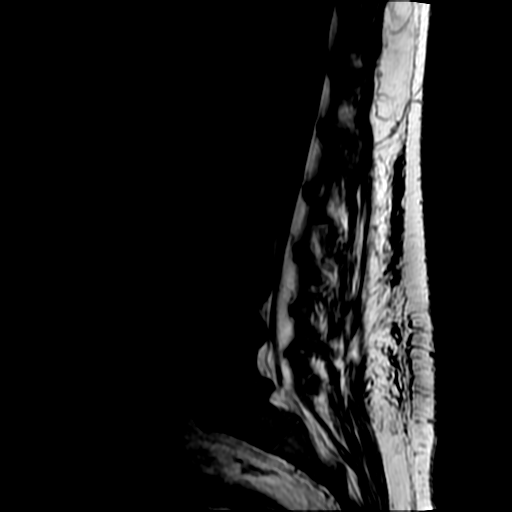
[im 13/13]
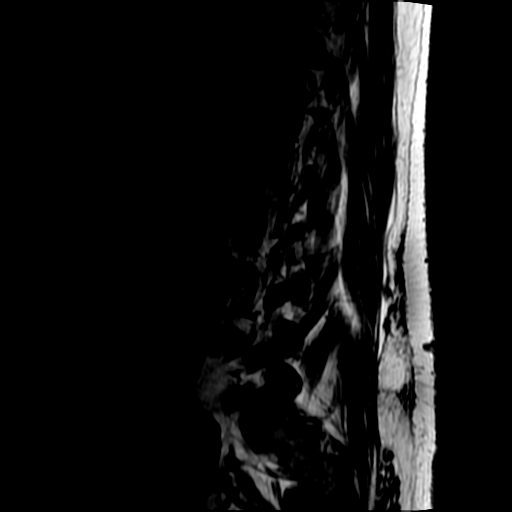

[Series 6: T2 · axial · 4.0mm · 0.39mm/px · z∈[-140,+22]mm · 7 of 36 slices shown (2 of 2)]
[im 3/36]
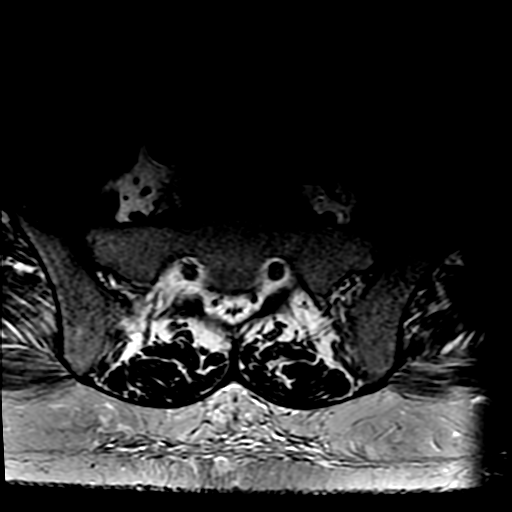
[im 5/36]
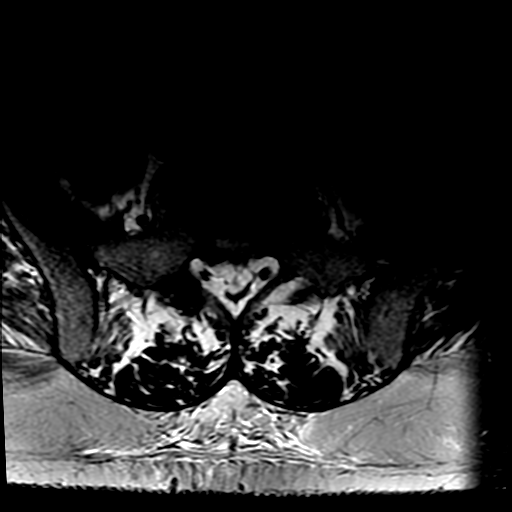
[im 8/36]
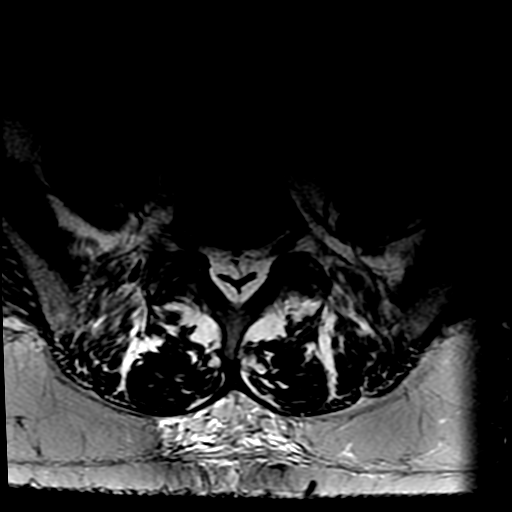
[im 12/36]
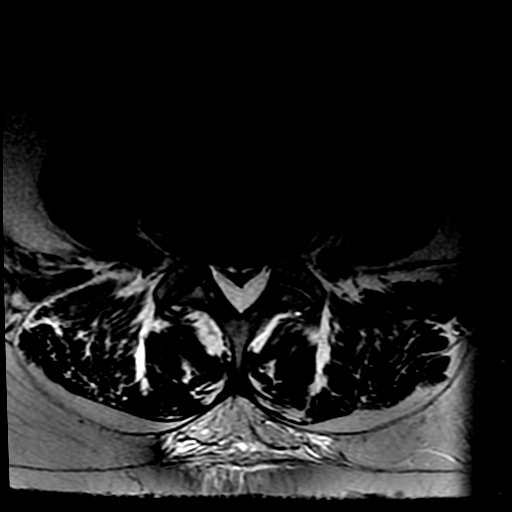
[im 17/36]
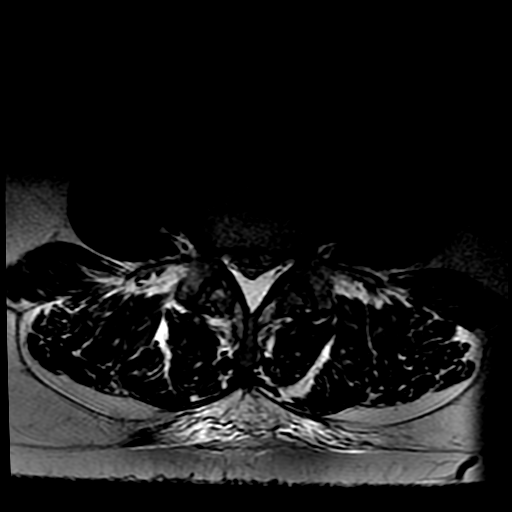
[im 19/36]
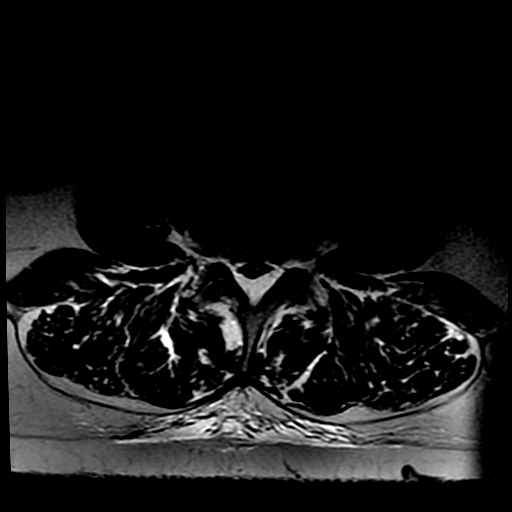
[im 31/36]
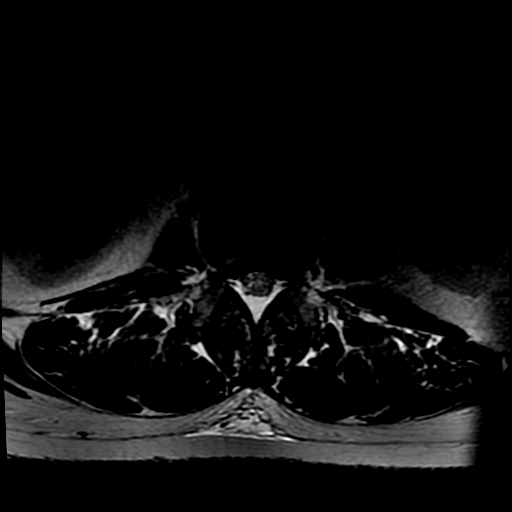

[Series 7: T1 · axial · 4.0mm · 0.39mm/px · z∈[-130,+22]mm · 3 of 36 slices shown (2 of 2)]
[im 5/36]
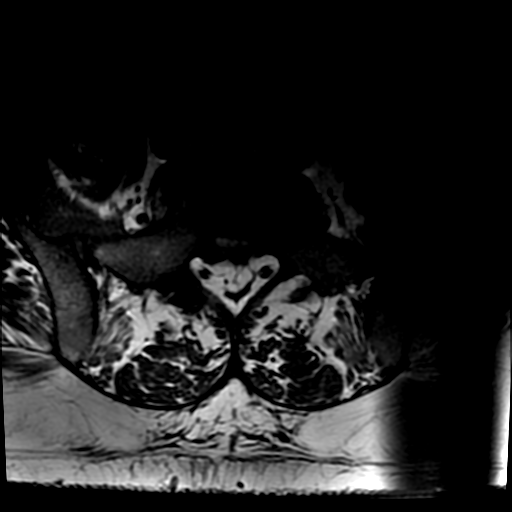
[im 19/36]
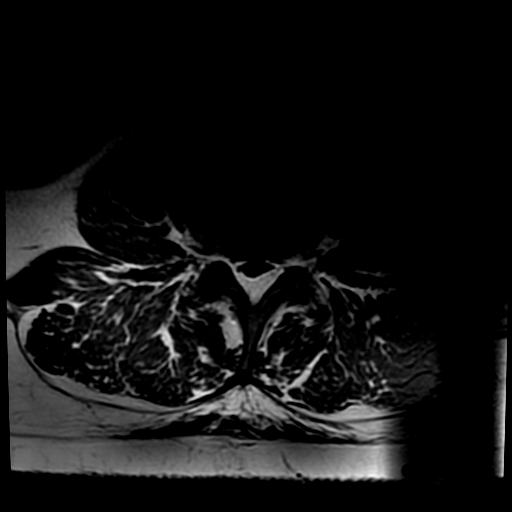
[im 31/36]
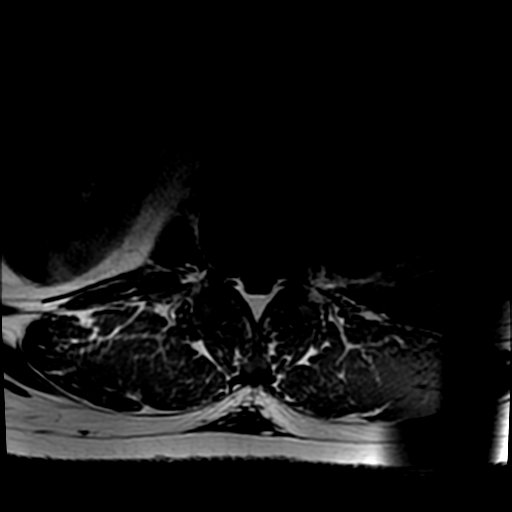

[18 of 48 positions shown; findings below may reference images not displayed]

FINDINGS: Segmentation: There are five lumbar type vertebral bodies. The last
full intervertebral disc space is labeled L5-S1.

Alignment:  Normal

Vertebrae: Normal marrow signal except for a few small scattered
hemangiomas and mild endplate reactive changes.

Conus medullaris and cauda equina: Conus extends to the T12-L1
level. Conus and cauda equina appear normal.

Paraspinal and other soft tissues: No significant paraspinal or
retroperitoneal findings.

Disc levels:

L1-2: No disc protrusions or foraminal stenosis. Mild epidural
lipomatosis pushing the thecal sac anteriorly.

L2-3: No focal disc protrusions or foraminal stenosis. Moderate
epidural lipomatosis pushing the thecal sac anteriorly.

L3-4: Slight bulging annulus and osteophytic ridging but no focal
disc protrusion. There is significant epidural lipomatosis
compressing the thecal sac anteriorly. No foraminal stenosis.

L4-5: Bulging annulus and mild osteophytic ridging along with a
shallow right foraminal disc osteophyte complex. There is mild right
foraminal encroachment possibly irritating the right L4 nerve root.
Significant epidural lipomatosis compressing the thecal sac.

L5-S1: Shallow broad-based disc protrusion with slight extrusion of
disc material down behind the S1 vertebral body. No direct neural
compression but there is significant epidural lipomatosis
compressing the thecal sac. Mild foraminal encroachment bilaterally.
IMPRESSION: 1. Significant multilevel epidural lipomatosis with compression of
the thecal sac.
2. Bulging disc at L4-5 along with osteophytic ridging. There is
also a shallow right foraminal disc osteophyte complex and mild
right foraminal narrowing with potential irritation of the right L4
nerve root.
3. Small central disc extrusion at L5-S1 but no direct neural
compression.

## 2020-10-27 ENCOUNTER — Other Ambulatory Visit: Payer: Self-pay

## 2020-10-27 ENCOUNTER — Ambulatory Visit (HOSPITAL_COMMUNITY)
Admission: RE | Admit: 2020-10-27 | Discharge: 2020-10-27 | Disposition: A | Discharge status: Home / Self Care | Payer: Medicare Other | Source: Ambulatory Visit | Attending: Gastroenterology | Admitting: Gastroenterology

## 2020-10-27 DIAGNOSIS — K7460 Unspecified cirrhosis of liver: Secondary | ICD-10-CM | POA: Insufficient documentation

## 2020-12-27 ENCOUNTER — Ambulatory Visit (INDEPENDENT_AMBULATORY_CARE_PROVIDER_SITE_OTHER): Payer: Medicare Other | Admitting: Gastroenterology

## 2020-12-27 ENCOUNTER — Encounter: Payer: Self-pay | Admitting: Gastroenterology

## 2020-12-27 ENCOUNTER — Other Ambulatory Visit: Payer: Self-pay

## 2020-12-27 ENCOUNTER — Other Ambulatory Visit (INDEPENDENT_AMBULATORY_CARE_PROVIDER_SITE_OTHER): Payer: Medicare Other

## 2020-12-27 VITALS — BP 128/78 | HR 100 | Ht 68.75 in | Wt 241.4 lb

## 2020-12-27 DIAGNOSIS — F1010 Alcohol abuse, uncomplicated: Secondary | ICD-10-CM | POA: Diagnosis not present

## 2020-12-27 DIAGNOSIS — I8501 Esophageal varices with bleeding: Secondary | ICD-10-CM | POA: Diagnosis not present

## 2020-12-27 DIAGNOSIS — K7460 Unspecified cirrhosis of liver: Secondary | ICD-10-CM

## 2020-12-27 DIAGNOSIS — K219 Gastro-esophageal reflux disease without esophagitis: Secondary | ICD-10-CM

## 2020-12-27 LAB — COMPREHENSIVE METABOLIC PANEL
ALT: 33 U/L (ref 0–53)
AST: 38 U/L — ABNORMAL HIGH (ref 0–37)
Albumin: 4.2 g/dL (ref 3.5–5.2)
Alkaline Phosphatase: 72 U/L (ref 39–117)
BUN: 11 mg/dL (ref 6–23)
CO2: 26 mEq/L (ref 19–32)
Calcium: 9.2 mg/dL (ref 8.4–10.5)
Chloride: 99 mEq/L (ref 96–112)
Creatinine, Ser: 0.8 mg/dL (ref 0.40–1.50)
GFR: 104.24 mL/min (ref >60.00)
Glucose, Bld: 83 mg/dL (ref 70–99)
Potassium: 4 mEq/L (ref 3.5–5.1)
Sodium: 133 mEq/L — ABNORMAL LOW (ref 135–145)
Total Bilirubin: 0.5 mg/dL (ref 0.2–1.2)
Total Protein: 7.8 g/dL (ref 6.0–8.3)

## 2020-12-27 LAB — CBC WITH DIFFERENTIAL/PLATELET
Basophils Absolute: 0 10*3/uL (ref 0.0–0.1)
Basophils Relative: 0.4 % (ref 0.0–3.0)
Eosinophils Absolute: 0.1 10*3/uL (ref 0.0–0.7)
Eosinophils Relative: 1.3 % (ref 0.0–5.0)
HCT: 42 % (ref 39.0–52.0)
Hemoglobin: 14.2 g/dL (ref 13.0–17.0)
Lymphocytes Relative: 16.3 % (ref 12.0–46.0)
Lymphs Abs: 1.3 10*3/uL (ref 0.7–4.0)
MCHC: 33.9 g/dL (ref 30.0–36.0)
MCV: 94 fl (ref 78.0–100.0)
Monocytes Absolute: 1.1 10*3/uL — ABNORMAL HIGH (ref 0.1–1.0)
Monocytes Relative: 13.3 % — ABNORMAL HIGH (ref 3.0–12.0)
Neutro Abs: 5.5 10*3/uL (ref 1.4–7.7)
Neutrophils Relative %: 68.7 % (ref 43.0–77.0)
Platelets: 133 10*3/uL — ABNORMAL LOW (ref 150.0–400.0)
RBC: 4.47 Mil/uL (ref 4.22–5.81)
RDW: 14.9 % (ref 11.5–15.5)
WBC: 8 10*3/uL (ref 4.0–10.5)

## 2020-12-27 LAB — PROTIME-INR
INR: 1.2 ratio — ABNORMAL HIGH (ref 0.8–1.0)
Prothrombin Time: 13.3 s — ABNORMAL HIGH (ref 9.6–13.1)

## 2020-12-27 NOTE — H&P (View-Only) (Signed)
HPI :  50 year old male here for follow-up visit for cirrhosis.  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 He was banded at that time, and then banded again during a follow up EGD lMarch 2020. Subsequent endoscopies have not shown any recurrence, last done 10/2019.   He states in general he is doing pretty well since of last seen him.  He denies any jaundice.  No edema.  No ascites.  He is taking Aldactone 100 mg daily and tolerating it to control fluid.  He is taking carvedilol twice daily.  He states he is not drinking any liquor but has been drinking about 6-8 beers per night most days of the week.  He states this is an improvement from previously when he was drinking a case of beer per day.  Unfortunately since he has had life stressors he has not been able to quit.  He has seen a counselor and been to [place] in the past, he states AA does not really help with the counseling may.  We discussed his alcohol use for a bit.  He has been vaccinated to hep A and B and completed the series in 2019.  He had a right upper quadrant ultrasound last done in December 2021 which showed cirrhosis without any mass lesions.  His last blood work was performed in July 2021.  He otherwise is taking Protonix dosed at 40 mg once to twice daily and states it works quite well for him to control symptoms.  He has required ongoing Protonix to control reflux.  No dysphagia.  Prior workup: EGD 01/06/2018 - 2cm HH, no varices, portal hypertensive gastriits EGD 11/25/18 - large esophageal varices, one with red whale sign - banded x 6, severe portal hypertensive gastritis EGD 12/16/18 - grade II varices in the lower esophagus, procedure was aborted due to large volume of food in the stomach, no banding done  CT  scan 12/11/2008 - cirrhotic appearing liver, ? Wall thickening of the ascending and proximal descending colon, small left inguinal and umbilical hernias  EGD 11/17/94 -Esophagogastric landmarks identified. - Grade II esophageal varix - overall improved since last exam. Banded x 3. - Mild portal hypertensive gastritis. - Normal duodenal bulb and second portion of the duodenum.  Colonoscopy - 01/19/19 -One 8 mm polyp in the cecum, removed with a cold snare. Resected and retrieved. - Two 5 to 6 mm polyps in the transverse colon, removed with a cold snare. Resected and retrieved. - One 5 mm polyp in the descending colon, removed with a cold snare. Resected and retrieved. - One 5 mm polyp in the sigmoid colon, removed with a cold snare. Resected and retrieved. - Mulitple suspected rectal hyperplastic polyps - two representative lesions removed with a cold snare. Resected and retrieved. - One 8 mm polyp in the rectum, removed with a hot snare. Resected and retrieved. - Diverticulosis in the transverse colon and in the ascending colon. - Tortous colon - Edema throughout, consistent with change from portal hypertension, I suspect the cause of CT changes. - Internal hemorrhoids. - The examination was otherwise normal.  EGD 04/28/19 -Esophagogastric landmarks identified. - Small esophageal varices that easily flattened with insufflation and stigmata of prior banding noted. No further banding performed today. - Normal stomach. - Normal duodenal bulb and second portion of  the duodenum.  EGD 11/03/19 - Dr. Loletha Carrow. Grade I varices were found in the lower third of the esophagus. No banding required. Portal hypertensive gastropathy was found in the entire examined stomach. A large amount of food (residue) was found in the entire examined stomach. This limited visualization. Retroflexion performed in stomach, but gastric cardia/fundus not well visualized as a result of retained Food. The examined  duodenum was normal.  RUQ [place] 10/27/20 - IMPRESSION: Cirrhotic appearing liver without definite mass. Remainder of exam unremarkable     Past Medical History:  Diagnosis Date  . Alcoholism ([place])   . Blood transfusion without reported diagnosis    jan, 2020 3 units PRBC  . Cirrhosis ([place])    secondary to alcohol and hep C  . Constipation    Resolved  . COPD (chronic obstructive pulmonary disease) ([place])   . DDD (degenerative disc disease), cervical   . ED (erectile dysfunction)   . Esophageal varices ([place])   . GERD (gastroesophageal reflux disease)   . H/O: upper GI bleed   . Hepatitis C antibody positive in blood    resolved  . High blood pressure   . History of anemia   . Left inguinal hernia 2020  . Lung nodule    Small  . Sleep apnea    in process of getting CPAP  . Substance abuse ([place])    was an alcoholic  . Umbilical hernia 2409     Past Surgical History:  Procedure Laterality Date  . arm surgery     left arm,  . COLONOSCOPY WITH PROPOFOL N/A 01/19/2019   Procedure: COLONOSCOPY WITH PROPOFOL;  Surgeon: Yetta Flock, MD;  Location: [facility];  Service: Gastroenterology;  Laterality: N/A;  . ESOPHAGEAL BANDING  11/25/2018   Procedure: ESOPHAGEAL BANDING;  Surgeon: Milus Banister, MD;  Location: [facility];  Service: Endoscopy;;  . ESOPHAGEAL BANDING  01/19/2019   Procedure: ESOPHAGEAL BANDING;  Surgeon: Yetta Flock, MD;  Location: [facility];  Service: Gastroenterology;;  . ESOPHAGOGASTRODUODENOSCOPY (EGD) WITH PROPOFOL N/A 11/25/2018   Procedure: ESOPHAGOGASTRODUODENOSCOPY (EGD) WITH PROPOFOL;  Surgeon: Milus Banister, MD;  Location: [facility];  Service: Endoscopy;  Laterality: N/A;  . ESOPHAGOGASTRODUODENOSCOPY (EGD) WITH PROPOFOL N/A 01/19/2019   Procedure: ESOPHAGOGASTRODUODENOSCOPY (EGD) WITH PROPOFOL;  Surgeon: Yetta Flock, MD;  Location: [facility];  Service: Gastroenterology;  Laterality: N/A;  .  ESOPHAGOGASTRODUODENOSCOPY (EGD) WITH PROPOFOL N/A 04/28/2019   Procedure: ESOPHAGOGASTRODUODENOSCOPY (EGD) WITH PROPOFOL;  Surgeon: Yetta Flock, MD;  Location: [facility];  Service: Gastroenterology;  Laterality: N/A;  . ESOPHAGOGASTRODUODENOSCOPY (EGD) WITH PROPOFOL N/A 11/03/2019   Procedure: ESOPHAGOGASTRODUODENOSCOPY (EGD) WITH PROPOFOL;  Surgeon: Doran Stabler, MD;  Location: [facility];  Service: Gastroenterology;  Laterality: N/A;  . KNEE SURGERY Left   . POLYPECTOMY  01/19/2019   Procedure: POLYPECTOMY;  Surgeon: Yetta Flock, MD;  Location: [facility];  Service: Gastroenterology;;   Family History  Problem Relation Age of Onset  . Colitis Mother   . Arthritis Mother   . Cancer Father        lymphoma?  . Cancer Paternal Grandfather        bone  . Heart disease Neg Hx   . Stroke Neg Hx   . Diabetes Neg Hx    Social History   Tobacco Use  . Smoking status: Current Every Day Smoker    Packs/day: 0.50    Years: 30.00    Pack years: 15.00    Types: Cigarettes  .  Smokeless tobacco: Former Systems developer    Types: Secondary school teacher  . Vaping Use: Never used  Substance Use Topics  . Alcohol use: Not Currently    Comment: stopped in 11/2018  . Drug use: No   Current Outpatient Medications  Medication Sig Dispense Refill  . acetaminophen (TYLENOL) 500 MG tablet Take 1,000 mg by mouth every 6 (six) hours as needed (pain.).    Marland Kitchen albuterol (PROVENTIL) (2.5 MG/3ML) 0.083% nebulizer solution Take 3 mLs (2.5 mg total) by nebulization every 6 (six) hours as needed for wheezing or shortness of breath. 150 mL prn  . buPROPion (WELLBUTRIN SR) 150 MG 12 hr tablet Take 1 tablet (150 mg total) by mouth 2 (two) times daily. 60 tablet 2  . carvedilol (COREG) 3.125 MG tablet Take 1 tablet (3.125 mg total) by mouth 2 (two) times daily with a meal. TAKE 1 TABLET (3.125 MG TOTAL) BY MOUTH 2 (TWO) TIMES DAILY. 60 tablet 3  . fluticasone furoate-vilanterol (BREO ELLIPTA) 200-25  MCG/INH AEPB Inhale 1 puff into the lungs daily. 180 each 1  . loratadine (CLARITIN) 10 MG tablet Take 10 mg by mouth daily as needed for allergies.     . Milk Thistle 1000 MG CAPS Take 1,000 mg by mouth 2 (two) times daily.    . Multiple Vitamin (MULTIVITAMIN WITH MINERALS) TABS tablet Take 1 tablet by mouth daily.    Marland Kitchen ofloxacin (OCUFLOX) 0.3 % ophthalmic solution Place 1 drop into the left eye 4 (four) times daily. 5 mL 0  . pantoprazole (PROTONIX) 40 MG tablet TAKE 1 TABLET BY MOUTH 2 (TWO) TIMES DAILY BEFORE A MEAL FOR 30 DAYS. 60 tablet 6  . spironolactone (ALDACTONE) 100 MG tablet Take 1 tablet (100 mg total) by mouth daily. 90 tablet 0  . tadalafil, PAH, (ADCIRCA) 20 MG tablet TAKE 1 TABLET (20 MG TOTAL) BY MOUTH DAILY AS NEEDED FOR ERECTILE DYSFUNCTION. 10 tablet 2  . tamsulosin (FLOMAX) 0.4 MG CAPS capsule TAKE 1 CAPSULE (0.4 MG TOTAL) BY MOUTH DAILY. 90 capsule 1  . VENTOLIN HFA 108 (90 Base) MCG/ACT inhaler INHALE 1 PUFF EVERY 6 (SIX) HOURS AS NEEDED INTO THE LUNGS FOR WHEEZING OR SHORTNESS OF BREATH. 18 g 1   No current facility-administered medications for this visit.   Allergies  Allergen Reactions  . Naproxen Hives and Swelling     Review of Systems: All systems reviewed and negative except where noted in HPI.   Lab Results  Component Value Date   WBC 8.0 12/27/2020   HGB 14.2 12/27/2020   HCT 42.0 12/27/2020   MCV 94.0 12/27/2020   PLT 133.0 (L) 12/27/2020    Lab Results  Component Value Date   CREATININE 0.80 12/27/2020   BUN 11 12/27/2020   NA 133 (L) 12/27/2020   K 4.0 12/27/2020   CL 99 12/27/2020   CO2 26 12/27/2020    Lab Results  Component Value Date   ALT 33 12/27/2020   AST 38 (H) 12/27/2020   ALKPHOS 72 12/27/2020   BILITOT 0.5 12/27/2020     Physical Exam: BP 128/78 (BP Location: Left Arm, Patient Position: Sitting, Cuff Size: Normal)   Pulse 100   Ht 5' 8.75" (1.746 m) Comment: height measured without shoes  Wt 241 lb 6 oz (109.5 kg)    BMI 35.90 kg/m  Constitutional: Pleasant,well-developed, male in no acute distress. Abdominal: Soft, nondistended, nontender. (+) periumbilical hernia. There are no masses palpable. Extremities: no edema Lymphadenopathy: No cervical adenopathy noted. Neurological:  Alert and oriented to person place and time. Skin: Skin is warm and dry. No rashes noted. Psychiatric: Normal mood and affect. Behavior is normal.   ASSESSMENT AND PLAN: 50 year old male here for reassessment of the following:  Cirrhosis Esophageal varices Alcohol abuse GERD  Cirrhosis has been complicated by bleeding esophageal varices.  He had banding which eradicated the varices a few years ago but is due for surveillance endoscopy at this time.  I recommend this be done at the hospital in case he warrants band ligation again.  I discussed risk and benefits of endoscopy and anesthesia and he wants to proceed.  Otherwise he has ongoing alcohol use which puts him at high risk for further decompensation.  We discussed his alcohol use for a bit and I recommend complete abstinence if he is able to do so.  I offered him support through counseling or behavioral health, offered referrals which she is declining at this time.  He will do his best on his own to reduce intake but I suspect he will need help with this.  He is not interested in going to [place] again etc.  Otherwise his [place] screening is up-to-date, due for basic labs today.  We will continue Protonix as that works quite well for his reflux at this time, recommend lowest dose needed to control symptoms.  Otherwise no acute issues otherwise.  Plan: - EGD to be done at [facility] hospital for varices surveillance, band ligation will be performed if amenable - CBC, CMET, INR, AFP - repeat US in 04/2021 for [facility] screening - alcohol abstinence recommended and discussed as above - follow up in clinic in 6 months  [place] Cellar, MD [facility]

## 2020-12-27 NOTE — Patient Instructions (Addendum)
If you are age 49 or older, your body mass index should be between 23-30. Your Body mass index is 35.9 kg/m. If this is out of the aforementioned range listed, please consider follow up with your Primary Care Provider.  If you are age 49 or younger, your body mass index should be between 19-25. Your Body mass index is 35.9 kg/m. If this is out of the aformentioned range listed, please consider follow up with your Primary Care Provider.  You have been scheduled for an endoscopy. Please follow written instructions given to you at your visit today. If you use inhalers (even only as needed), please bring them with you on the day of your procedure.  Please go to the lab in the basement of our building to have lab work done as you leave today. Hit "B" for basement when you get on the elevator.  When the doors open the lab is on your left.  We will call you with the results. Thank you.  Due to recent changes in healthcare laws, you may see the results of your imaging and laboratory studies on MyChart before your provider has had a chance to review them.  We understand that in some cases there may be results that are confusing or concerning to you. Not all laboratory results come back in the same time frame and the provider may be waiting for multiple results in order to interpret others.  Please give [place] 48 hours in order for your provider to thoroughly review all the results before contacting the office for clarification of your results.   You will be due for an ultrasound of your liver in 04-2021. We will remind you when it is time to schedule.   Thank you for entrusting me with your care and for choosing [facility], Dr. [place] Cellar

## 2020-12-27 NOTE — Progress Notes (Signed)
HPI :  50 year old male here for follow-up visit for cirrhosis.  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 He was banded at that time, and then banded again during a follow up EGD lMarch 2020. Subsequent endoscopies have not shown any recurrence, last done 10/2019.   He states in general he is doing pretty well since of last seen him.  He denies any jaundice.  No edema.  No ascites.  He is taking Aldactone 100 mg daily and tolerating it to control fluid.  He is taking carvedilol twice daily.  He states he is not drinking any liquor but has been drinking about 6-8 beers per night most days of the week.  He states this is an improvement from previously when he was drinking a case of beer per day.  Unfortunately since he has had life stressors he has not been able to quit.  He has seen a counselor and been to [facility] in the past, he states AA does not really help with the counseling may.  We discussed his alcohol use for a bit.  He has been vaccinated to hep A and B and completed the series in 2019.  He had a right upper quadrant ultrasound last done in December 2021 which showed cirrhosis without any mass lesions.  His last blood work was performed in July 2021.  He otherwise is taking Protonix dosed at 40 mg once to twice daily and states it works quite well for him to control symptoms.  He has required ongoing Protonix to control reflux.  No dysphagia.  Prior workup: EGD 01/06/2018 - 2cm HH, no varices, portal hypertensive gastriits EGD 11/25/18 - large esophageal varices, one with red whale sign - banded x 6, severe portal hypertensive gastritis EGD 12/16/18 - grade II varices in the lower esophagus, procedure was aborted due to large volume of food in the stomach, no banding done  CT  scan 12/11/2008 - cirrhotic appearing liver, ? Wall thickening of the ascending and proximal descending colon, small left inguinal and umbilical hernias  EGD 02/11/34 -Esophagogastric landmarks identified. - Grade II esophageal varix - overall improved since last exam. Banded x 3. - Mild portal hypertensive gastritis. - Normal duodenal bulb and second portion of the duodenum.  Colonoscopy - 01/19/19 -One 8 mm polyp in the cecum, removed with a cold snare. Resected and retrieved. - Two 5 to 6 mm polyps in the transverse colon, removed with a cold snare. Resected and retrieved. - One 5 mm polyp in the descending colon, removed with a cold snare. Resected and retrieved. - One 5 mm polyp in the sigmoid colon, removed with a cold snare. Resected and retrieved. - Mulitple suspected rectal hyperplastic polyps - two representative lesions removed with a cold snare. Resected and retrieved. - One 8 mm polyp in the rectum, removed with a hot snare. Resected and retrieved. - Diverticulosis in the transverse colon and in the ascending colon. - Tortous colon - Edema throughout, consistent with change from portal hypertension, I suspect the cause of CT changes. - Internal hemorrhoids. - The examination was otherwise normal.  EGD 04/28/19 -Esophagogastric landmarks identified. - Small esophageal varices that easily flattened with insufflation and stigmata of prior banding noted. No further banding performed today. - Normal stomach. - Normal duodenal bulb and second portion of  the duodenum.  EGD 11/03/19 - Dr. Loletha Carrow. Grade I varices were found in the lower third of the esophagus. No banding required. Portal hypertensive gastropathy was found in the entire examined stomach. A large amount of food (residue) was found in the entire examined stomach. This limited visualization. Retroflexion performed in stomach, but gastric cardia/fundus not well visualized as a result of retained Food. The examined  duodenum was normal.  RUQ [place] 10/27/20 - IMPRESSION: Cirrhotic appearing liver without definite mass. Remainder of exam unremarkable     Past Medical History:  Diagnosis Date  . Alcoholism ([place])   . Blood transfusion without reported diagnosis    jan, 2020 3 units PRBC  . Cirrhosis ([place])    secondary to alcohol and hep C  . Constipation    Resolved  . COPD (chronic obstructive pulmonary disease) ([place])   . DDD (degenerative disc disease), cervical   . ED (erectile dysfunction)   . Esophageal varices ([place])   . GERD (gastroesophageal reflux disease)   . H/O: upper GI bleed   . Hepatitis C antibody positive in blood    resolved  . High blood pressure   . History of anemia   . Left inguinal hernia 2020  . Lung nodule    Small  . Sleep apnea    in process of getting CPAP  . Substance abuse ([place])    was an alcoholic  . Umbilical hernia 6301     Past Surgical History:  Procedure Laterality Date  . arm surgery     left arm,  . COLONOSCOPY WITH PROPOFOL N/A 01/19/2019   Procedure: COLONOSCOPY WITH PROPOFOL;  Surgeon: Yetta Flock, MD;  Location: [facility];  Service: Gastroenterology;  Laterality: N/A;  . ESOPHAGEAL BANDING  11/25/2018   Procedure: ESOPHAGEAL BANDING;  Surgeon: Milus Banister, MD;  Location: [facility];  Service: Endoscopy;;  . ESOPHAGEAL BANDING  01/19/2019   Procedure: ESOPHAGEAL BANDING;  Surgeon: Yetta Flock, MD;  Location: [facility];  Service: Gastroenterology;;  . ESOPHAGOGASTRODUODENOSCOPY (EGD) WITH PROPOFOL N/A 11/25/2018   Procedure: ESOPHAGOGASTRODUODENOSCOPY (EGD) WITH PROPOFOL;  Surgeon: Milus Banister, MD;  Location: [facility];  Service: Endoscopy;  Laterality: N/A;  . ESOPHAGOGASTRODUODENOSCOPY (EGD) WITH PROPOFOL N/A 01/19/2019   Procedure: ESOPHAGOGASTRODUODENOSCOPY (EGD) WITH PROPOFOL;  Surgeon: Yetta Flock, MD;  Location: [facility];  Service: Gastroenterology;  Laterality: N/A;  .  ESOPHAGOGASTRODUODENOSCOPY (EGD) WITH PROPOFOL N/A 04/28/2019   Procedure: ESOPHAGOGASTRODUODENOSCOPY (EGD) WITH PROPOFOL;  Surgeon: Yetta Flock, MD;  Location: [facility];  Service: Gastroenterology;  Laterality: N/A;  . ESOPHAGOGASTRODUODENOSCOPY (EGD) WITH PROPOFOL N/A 11/03/2019   Procedure: ESOPHAGOGASTRODUODENOSCOPY (EGD) WITH PROPOFOL;  Surgeon: Doran Stabler, MD;  Location: [facility];  Service: Gastroenterology;  Laterality: N/A;  . KNEE SURGERY Left   . POLYPECTOMY  01/19/2019   Procedure: POLYPECTOMY;  Surgeon: Yetta Flock, MD;  Location: [facility];  Service: Gastroenterology;;   Family History  Problem Relation Age of Onset  . Colitis Mother   . Arthritis Mother   . Cancer Father        lymphoma?  . Cancer Paternal Grandfather        bone  . Heart disease Neg Hx   . Stroke Neg Hx   . Diabetes Neg Hx    Social History   Tobacco Use  . Smoking status: Current Every Day Smoker    Packs/day: 0.50    Years: 30.00    Pack years: 15.00    Types: Cigarettes  .  Smokeless tobacco: Former Systems developer    Types: Secondary school teacher  . Vaping Use: Never used  Substance Use Topics  . Alcohol use: Not Currently    Comment: stopped in 11/2018  . Drug use: No   Current Outpatient Medications  Medication Sig Dispense Refill  . acetaminophen (TYLENOL) 500 MG tablet Take 1,000 mg by mouth every 6 (six) hours as needed (pain.).    Marland Kitchen albuterol (PROVENTIL) (2.5 MG/3ML) 0.083% nebulizer solution Take 3 mLs (2.5 mg total) by nebulization every 6 (six) hours as needed for wheezing or shortness of breath. 150 mL prn  . buPROPion (WELLBUTRIN SR) 150 MG 12 hr tablet Take 1 tablet (150 mg total) by mouth 2 (two) times daily. 60 tablet 2  . carvedilol (COREG) 3.125 MG tablet Take 1 tablet (3.125 mg total) by mouth 2 (two) times daily with a meal. TAKE 1 TABLET (3.125 MG TOTAL) BY MOUTH 2 (TWO) TIMES DAILY. 60 tablet 3  . fluticasone furoate-vilanterol (BREO ELLIPTA) 200-25  MCG/INH AEPB Inhale 1 puff into the lungs daily. 180 each 1  . loratadine (CLARITIN) 10 MG tablet Take 10 mg by mouth daily as needed for allergies.     . Milk Thistle 1000 MG CAPS Take 1,000 mg by mouth 2 (two) times daily.    . Multiple Vitamin (MULTIVITAMIN WITH MINERALS) TABS tablet Take 1 tablet by mouth daily.    Marland Kitchen ofloxacin (OCUFLOX) 0.3 % ophthalmic solution Place 1 drop into the left eye 4 (four) times daily. 5 mL 0  . pantoprazole (PROTONIX) 40 MG tablet TAKE 1 TABLET BY MOUTH 2 (TWO) TIMES DAILY BEFORE A MEAL FOR 30 DAYS. 60 tablet 6  . spironolactone (ALDACTONE) 100 MG tablet Take 1 tablet (100 mg total) by mouth daily. 90 tablet 0  . tadalafil, PAH, (ADCIRCA) 20 MG tablet TAKE 1 TABLET (20 MG TOTAL) BY MOUTH DAILY AS NEEDED FOR ERECTILE DYSFUNCTION. 10 tablet 2  . tamsulosin (FLOMAX) 0.4 MG CAPS capsule TAKE 1 CAPSULE (0.4 MG TOTAL) BY MOUTH DAILY. 90 capsule 1  . VENTOLIN HFA 108 (90 Base) MCG/ACT inhaler INHALE 1 PUFF EVERY 6 (SIX) HOURS AS NEEDED INTO THE LUNGS FOR WHEEZING OR SHORTNESS OF BREATH. 18 g 1   No current facility-administered medications for this visit.   Allergies  Allergen Reactions  . Naproxen Hives and Swelling     Review of Systems: All systems reviewed and negative except where noted in HPI.   Lab Results  Component Value Date   WBC 8.0 12/27/2020   HGB 14.2 12/27/2020   HCT 42.0 12/27/2020   MCV 94.0 12/27/2020   PLT 133.0 (L) 12/27/2020    Lab Results  Component Value Date   CREATININE 0.80 12/27/2020   BUN 11 12/27/2020   NA 133 (L) 12/27/2020   K 4.0 12/27/2020   CL 99 12/27/2020   CO2 26 12/27/2020    Lab Results  Component Value Date   ALT 33 12/27/2020   AST 38 (H) 12/27/2020   ALKPHOS 72 12/27/2020   BILITOT 0.5 12/27/2020     Physical Exam: BP 128/78 (BP Location: Left Arm, Patient Position: Sitting, Cuff Size: Normal)   Pulse 100   Ht 5' 8.75" (1.746 m) Comment: height measured without shoes  Wt 241 lb 6 oz (109.5 kg)    BMI 35.90 kg/m  Constitutional: Pleasant,well-developed, male in no acute distress. Abdominal: Soft, nondistended, nontender. (+) periumbilical hernia. There are no masses palpable. Extremities: no edema Lymphadenopathy: No cervical adenopathy noted. Neurological:  Alert and oriented to person place and time. Skin: Skin is warm and dry. No rashes noted. Psychiatric: Normal mood and affect. Behavior is normal.   ASSESSMENT AND PLAN: 50 year old male here for reassessment of the following:  Cirrhosis Esophageal varices Alcohol abuse GERD  Cirrhosis has been complicated by bleeding esophageal varices.  He had banding which eradicated the varices a few years ago but is due for surveillance endoscopy at this time.  I recommend this be done at the hospital in case he warrants band ligation again.  I discussed risk and benefits of endoscopy and anesthesia and he wants to proceed.  Otherwise he has ongoing alcohol use which puts him at high risk for further decompensation.  We discussed his alcohol use for a bit and I recommend complete abstinence if he is able to do so.  I offered him support through counseling or behavioral health, offered referrals which she is declining at this time.  He will do his best on his own to reduce intake but I suspect he will need help with this.  He is not interested in going to [facility] again etc.  Otherwise his [place] screening is up-to-date, due for basic labs today.  We will continue Protonix as that works quite well for his reflux at this time, recommend lowest dose needed to control symptoms.  Otherwise no acute issues otherwise.  Plan: - EGD to be done at [facility] hospital for varices surveillance, band ligation will be performed if amenable - CBC, CMET, INR, AFP - repeat US in 04/2021 for [facility] screening - alcohol abstinence recommended and discussed as above - follow up in clinic in 6 months  Crane Cellar, MD [facility] Gastroenterology

## 2020-12-27 NOTE — Progress Notes (Signed)
amb ref °

## 2020-12-28 LAB — AFP TUMOR MARKER: AFP-Tumor Marker: 3.5 ng/mL (ref <6.1)

## 2021-01-17 ENCOUNTER — Other Ambulatory Visit: Payer: Self-pay

## 2021-01-20 ENCOUNTER — Other Ambulatory Visit (HOSPITAL_COMMUNITY)
Admission: RE | Admit: 2021-01-20 | Discharge: 2021-01-20 | Disposition: A | Discharge status: Home / Self Care | Payer: Medicare Other | Source: Ambulatory Visit | Attending: Gastroenterology | Admitting: Gastroenterology

## 2021-01-20 DIAGNOSIS — Z20822 Contact with and (suspected) exposure to covid-19: Secondary | ICD-10-CM | POA: Diagnosis not present

## 2021-01-20 DIAGNOSIS — Z01812 Encounter for preprocedural laboratory examination: Secondary | ICD-10-CM | POA: Diagnosis present

## 2021-01-20 LAB — SARS CORONAVIRUS 2 (TAT 6-24 HRS): SARS Coronavirus 2: NEGATIVE

## 2021-01-24 ENCOUNTER — Ambulatory Visit (HOSPITAL_COMMUNITY): Payer: Medicare Other | Admitting: Anesthesiology

## 2021-01-24 ENCOUNTER — Other Ambulatory Visit: Payer: Self-pay

## 2021-01-24 ENCOUNTER — Encounter (HOSPITAL_COMMUNITY)
Admission: RE | Disposition: A | Discharge status: Home / Self Care | Payer: Self-pay | Source: Home / Self Care | Attending: Gastroenterology

## 2021-01-24 ENCOUNTER — Encounter (HOSPITAL_COMMUNITY): Payer: Self-pay | Admitting: Gastroenterology

## 2021-01-24 ENCOUNTER — Ambulatory Visit (HOSPITAL_COMMUNITY)
Admission: RE | Admit: 2021-01-24 | Discharge: 2021-01-24 | Disposition: A | Discharge status: Home / Self Care | Payer: Medicare Other | Attending: Gastroenterology | Admitting: Gastroenterology

## 2021-01-24 DIAGNOSIS — Z808 Family history of malignant neoplasm of other organs or systems: Secondary | ICD-10-CM | POA: Insufficient documentation

## 2021-01-24 DIAGNOSIS — I8511 Secondary esophageal varices with bleeding: Secondary | ICD-10-CM | POA: Diagnosis present

## 2021-01-24 DIAGNOSIS — I8510 Secondary esophageal varices without bleeding: Secondary | ICD-10-CM | POA: Diagnosis not present

## 2021-01-24 DIAGNOSIS — Z886 Allergy status to analgesic agent status: Secondary | ICD-10-CM | POA: Diagnosis not present

## 2021-01-24 DIAGNOSIS — K7460 Unspecified cirrhosis of liver: Secondary | ICD-10-CM

## 2021-01-24 DIAGNOSIS — K219 Gastro-esophageal reflux disease without esophagitis: Secondary | ICD-10-CM

## 2021-01-24 DIAGNOSIS — K766 Portal hypertension: Secondary | ICD-10-CM | POA: Diagnosis not present

## 2021-01-24 DIAGNOSIS — Z8379 Family history of other diseases of the digestive system: Secondary | ICD-10-CM | POA: Diagnosis not present

## 2021-01-24 DIAGNOSIS — Z79899 Other long term (current) drug therapy: Secondary | ICD-10-CM | POA: Diagnosis not present

## 2021-01-24 DIAGNOSIS — K7030 Alcoholic cirrhosis of liver without ascites: Secondary | ICD-10-CM | POA: Insufficient documentation

## 2021-01-24 DIAGNOSIS — Z8619 Personal history of other infectious and parasitic diseases: Secondary | ICD-10-CM | POA: Diagnosis not present

## 2021-01-24 DIAGNOSIS — F17210 Nicotine dependence, cigarettes, uncomplicated: Secondary | ICD-10-CM | POA: Insufficient documentation

## 2021-01-24 DIAGNOSIS — F1010 Alcohol abuse, uncomplicated: Secondary | ICD-10-CM

## 2021-01-24 DIAGNOSIS — K3189 Other diseases of stomach and duodenum: Secondary | ICD-10-CM | POA: Insufficient documentation

## 2021-01-24 DIAGNOSIS — Z7951 Long term (current) use of inhaled steroids: Secondary | ICD-10-CM | POA: Insufficient documentation

## 2021-01-24 DIAGNOSIS — I8501 Esophageal varices with bleeding: Secondary | ICD-10-CM

## 2021-01-24 HISTORY — PX: ESOPHAGEAL BANDING: SHX5518

## 2021-01-24 HISTORY — PX: ESOPHAGOGASTRODUODENOSCOPY (EGD) WITH PROPOFOL: SHX5813

## 2021-01-24 SURGERY — ESOPHAGOGASTRODUODENOSCOPY (EGD) WITH PROPOFOL
Anesthesia: Monitor Anesthesia Care

## 2021-01-24 MED ORDER — LIDOCAINE 2% (20 MG/ML) 5 ML SYRINGE
INTRAMUSCULAR | Status: DC | PRN
  Administered 2021-01-24: 100 mg via INTRAVENOUS

## 2021-01-24 MED ORDER — PROPOFOL 1000 MG/100ML IV EMUL
INTRAVENOUS | Status: AC
  Filled 2021-01-24: qty 100

## 2021-01-24 MED ORDER — PROPOFOL 10 MG/ML IV BOLUS
INTRAVENOUS | Status: DC | PRN
  Administered 2021-01-24: 20 mg via INTRAVENOUS
  Administered 2021-01-24: 60 mg via INTRAVENOUS
  Administered 2021-01-24 (×2): 30 mg via INTRAVENOUS
  Administered 2021-01-24: 40 mg via INTRAVENOUS
  Administered 2021-01-24: 30 mg via INTRAVENOUS
  Administered 2021-01-24: 60 mg via INTRAVENOUS

## 2021-01-24 MED ORDER — LACTATED RINGERS IV SOLN: INTRAVENOUS | Status: DC

## 2021-01-24 MED ORDER — SODIUM CHLORIDE 0.9 % IV SOLN: INTRAVENOUS | Status: DC

## 2021-01-24 SURGICAL SUPPLY — 14 items

## 2021-01-24 NOTE — Interval H&P Note (Signed)
History and Physical Interval Note: Patient without changes in his status since I have last seen him. Feeling well. He thinks his PCP may have stopped his Coreg? But he is not sure. Will need to clarify. Otherwise, denies any cardiopulmonary symptoms. Here for EGD for surveillance of varices and additional banding if needed. I have discussed risks / benefits of EGD and anesthesia with him and he wishes to proceed. Further recommendations pending results.   01/24/2021 10:18 AM  Levi Alvarez  has presented today for surgery, with the diagnosis of cirrhosis, esophageal varices.  The various methods of treatment have been discussed with the patient and family. After consideration of risks, benefits and other options for treatment, the patient has consented to  Procedure(s): ESOPHAGOGASTRODUODENOSCOPY (EGD) WITH PROPOFOL (N/A) ESOPHAGEAL BANDING (N/A) as a surgical intervention.  The patient's history has been reviewed, patient examined, no change in status, stable for surgery.  I have reviewed the patient's chart and labs.  Questions were answered to the patient's satisfaction.     [place]

## 2021-01-24 NOTE — Anesthesia Procedure Notes (Signed)
Date/Time: 01/24/2021 10:23 AM Performed by: Talbot Grumbling, CRNA Oxygen Delivery Method: Simple face mask

## 2021-01-24 NOTE — Op Note (Signed)
[facility] Patient Name: Levi Alvarez Procedure Date: 01/24/2021 MRN: [id number] Attending MD: Carlota Raspberry. Havery Moros , MD Date of Birth: [birth year] CSN: [id number] Age: 50 Admit Type: Outpatient Procedure:                Upper GI endoscopy Indications:              surveillance of esophageal varices - history of                            variceal bleed in 2020, s/p band ligation and was                            on Coreg, last EGD 1 year ago with small / trace                            varices. Clinically doing well. He reports PCP                            recently stopped Coreg? He is not sure why Providers:                Carlota Raspberry. Havery Moros, MD, Particia Nearing, RN, Benetta Spar, Technician Referring MD:              Medicines:                Monitored Anesthesia Care Complications:            No immediate complications. Estimated blood loss:                            None. Estimated Blood Loss:     Estimated blood loss: none. Procedure:                Pre-Anesthesia Assessment:                           - Prior to the procedure, a History and Physical                            was performed, and patient medications and                            allergies were reviewed. The patient's tolerance of                            previous anesthesia was also reviewed. The risks                            and benefits of the procedure and the sedation                            options and risks were discussed with the patient.  All questions were answered, and informed consent                            was obtained. Prior Anticoagulants: The patient has                            taken no previous anticoagulant or antiplatelet                            agents. ASA Grade Assessment: III - A patient with                            severe systemic disease. After reviewing the risks                            and  benefits, the patient was deemed in                            satisfactory condition to undergo the procedure.                           After obtaining informed consent, the endoscope was                            passed under direct vision. Throughout the                            procedure, the patient's blood pressure, pulse, and                            oxygen saturations were monitored continuously. The                            GIF-H190 ([id number]) Olympus gastroscope was                            introduced through the mouth, and advanced to the                            second part of duodenum. The upper GI endoscopy was                            accomplished without difficulty. The patient                            tolerated the procedure well. Scope In: Scope Out: Findings:      Esophagogastric landmarks were identified: the Z-line was found at 42       cm, the gastroesophageal junction was found at 42 cm and the upper       extent of the gastric folds was found at 42 cm from the incisors.      Trace varices were found in the lower third of the esophagus. They were       small in size. Not amenable to banding. There  was a protuberance of       benign appearing mucosa at the distal esophagus I suspect scarring from       prior banding      The exam of the esophagus was otherwise normal.      Mild portal hypertensive gastropathy was found in the gastric fundus and       in the gastric body.      The exam of the stomach was otherwise normal.      The duodenal bulb and second portion of the duodenum were normal. Impression:               - Esophagogastric landmarks identified.                           - Trace esophageal varices, not amenable to banding.                           - Normal esophagus otherwise                           - Portal hypertensive gastropathy.                           - Normal duodenal bulb and second portion of the                             duodenum. Moderate Sedation:      No moderate sedation, case performed with MAC Recommendation:           - Patient has a contact number available for                            emergencies. The signs and symptoms of potential                            delayed complications were discussed with the                            patient. Return to normal activities tomorrow.                            Written discharge instructions were provided to the                            patient.                           - Resume previous diet.                           - Continue present medications.                           - Please discuss with PCP about resuming Coreg or                            Propranolol given history of variceal bleeding,  this medication is very important to take long term                            to reduce risk for variceal bleeding                           - Repeat upper endoscopy in 1 year for surveillance. Procedure Code(s):        --- Professional ---                           [phone], Esophagogastroduodenoscopy, flexible,                            transoral; diagnostic, including collection of                            specimen(s) by brushing or washing, when performed                            (separate procedure) Diagnosis Code(s):        --- Professional ---                           I85.00, Esophageal varices without bleeding                           K76.6, Portal hypertension                           K31.89, Other diseases of stomach and duodenum CPT copyright 2019 American Medical Association. All rights reserved. The codes documented in this report are preliminary and upon coder review may  be revised to meet current compliance requirements. Remo Lipps P. Armbruster, MD 01/24/2021 10:49:00 AM This report has been signed electronically. Number of Addenda: 0

## 2021-01-24 NOTE — Anesthesia Postprocedure Evaluation (Signed)
Anesthesia Post Note  Patient: Levi Alvarez  Procedure(s) Performed: ESOPHAGOGASTRODUODENOSCOPY (EGD) WITH PROPOFOL (N/A ) ESOPHAGEAL BANDING (N/A )     Patient location during evaluation: Endoscopy Anesthesia Type: MAC Level of consciousness: awake and alert Pain management: pain level controlled Vital Signs Assessment: post-procedure vital signs reviewed and stable Respiratory status: spontaneous breathing, nonlabored ventilation and respiratory function stable Cardiovascular status: stable and blood pressure returned to baseline Postop Assessment: no apparent nausea or vomiting Anesthetic complications: no   No complications documented.  Last Vitals:  Vitals:   01/24/21 1100 01/24/21 1110  BP: (!) 142/74 138/83  Pulse: 62 (!) 58  Resp: 13 14  Temp:    SpO2: 98% 99%    Last Pain:  Vitals:   01/24/21 1110  TempSrc:   PainSc: 0-No pain                 Kleigh Hoelzer,W. EDMOND

## 2021-01-24 NOTE — Anesthesia Preprocedure Evaluation (Addendum)
Anesthesia Evaluation  Patient identified by MRN, date of birth, ID band Patient awake    Reviewed: Allergy & Precautions, H&P , NPO status , Patient's Chart, lab work & pertinent test results, reviewed documented beta blocker date and time   Airway Mallampati: III  TM Distance: >3 FB Neck ROM: Full    Dental no notable dental hx. (+) Teeth Intact, Dental Advisory Given   Pulmonary sleep apnea , COPD,  COPD inhaler, Current SmokerPatient did not abstain from smoking.,    Pulmonary exam normal breath sounds clear to auscultation       Cardiovascular hypertension, Pt. on medications and Pt. on home beta blockers  Rhythm:Regular Rate:Normal     Neuro/Psych negative neurological ROS  negative psych ROS   GI/Hepatic GERD  Medicated,(+) Cirrhosis   Esophageal Varices    ,   Endo/Other  Morbid obesity  Renal/GU negative Renal ROS  negative genitourinary   Musculoskeletal   Abdominal   Peds  Hematology  (+) Blood dyscrasia, anemia ,   Anesthesia Other Findings   Reproductive/Obstetrics negative OB ROS                            Anesthesia Physical Anesthesia Plan  ASA: III  Anesthesia Plan: MAC   Post-op Pain Management:    Induction: Intravenous  PONV Risk Score and Plan: 0 and Propofol infusion and Treatment may vary due to age or medical condition  Airway Management Planned: Simple Face Mask  Additional Equipment:   Intra-op Plan:   Post-operative Plan:   Informed Consent: I have reviewed the patients History and Physical, chart, labs and discussed the procedure including the risks, benefits and alternatives for the proposed anesthesia with the patient or authorized representative who has indicated his/her understanding and acceptance.     Dental advisory given  Plan Discussed with: CRNA  Anesthesia Plan Comments:         Anesthesia Quick Evaluation

## 2021-01-24 NOTE — Discharge Instructions (Signed)
YOU HAD AN ENDOSCOPIC PROCEDURE TODAY: Refer to the procedure report and other information in the discharge instructions given to you for any specific questions about what was found during the examination. If this information does not answer your questions, please call [place] office at [phone] to clarify.   YOU SHOULD EXPECT: Some feelings of bloating in the abdomen. Passage of more gas than usual. Walking can help get rid of the air that was put into your GI tract during the procedure and reduce the bloating. If you had a lower endoscopy (such as a colonoscopy or flexible sigmoidoscopy) you may notice spotting of blood in your stool or on the toilet paper. Some abdominal soreness may be present for a day or two, also.  DIET: Your first meal following the procedure should be a light meal and then it is ok to progress to your normal diet. A half-sandwich or bowl of soup is an example of a good first meal. Heavy or fried foods are harder to digest and may make you feel nauseous or bloated. Drink plenty of fluids but you should avoid alcoholic beverages for 24 hours. If you had a esophageal dilation, please see attached instructions for diet.    ACTIVITY: Your care partner should take you home directly after the procedure. You should plan to take it easy, moving slowly for the rest of the day. You can resume normal activity the day after the procedure however YOU SHOULD NOT DRIVE, use power tools, machinery or perform tasks that involve climbing or major physical exertion for 24 hours (because of the sedation medicines used during the test).   SYMPTOMS TO REPORT IMMEDIATELY: A gastroenterologist can be reached at any hour. Please call [phone]  for any of the following symptoms:   Following upper endoscopy (EGD, EUS, ERCP, esophageal dilation) Vomiting of blood or coffee ground material  New, significant abdominal pain  New, significant chest pain or pain under the shoulder blades  Painful or  persistently difficult swallowing  New shortness of breath  Black, tarry-looking or red, bloody stools  FOLLOW UP:  If any biopsies were taken you will be contacted by phone or by letter within the next 1-3 weeks. Call [phone]  if you have not heard about the biopsies in 3 weeks.  Please also call with any specific questions about appointments or follow up tests.  

## 2021-01-24 NOTE — Transfer of Care (Signed)
Immediate Anesthesia Transfer of Care Note  Patient: Levi Alvarez  Procedure(s) Performed: ESOPHAGOGASTRODUODENOSCOPY (EGD) WITH PROPOFOL (N/A ) ESOPHAGEAL BANDING (N/A )  Patient Location: PACU  Anesthesia Type:MAC  Level of Consciousness: sedated  Airway & Oxygen Therapy: Patient Spontanous Breathing and Patient connected to face mask oxygen  Post-op Assessment: Report given to RN and Post -op Vital signs reviewed and stable  Post vital signs: Reviewed and stable  Last Vitals:  Vitals Value Taken Time  BP    Temp    Pulse    Resp    SpO2      Last Pain:  Vitals:   01/24/21 0928  TempSrc: Oral  PainSc: 0-No pain         Complications: No complications documented.

## 2021-01-26 ENCOUNTER — Encounter (HOSPITAL_COMMUNITY): Payer: Self-pay | Admitting: Gastroenterology

## 2021-04-09 ENCOUNTER — Inpatient Hospital Stay (HOSPITAL_COMMUNITY)
Admission: EM | Admit: 2021-04-09 | Discharge: 2021-04-11 | DRG: 440 | Disposition: A | Discharge status: Home / Self Care | Payer: Medicare Other | Attending: Family Medicine | Admitting: Family Medicine

## 2021-04-09 ENCOUNTER — Emergency Department (HOSPITAL_COMMUNITY): Payer: Medicare Other

## 2021-04-09 DIAGNOSIS — K852 Alcohol induced acute pancreatitis without necrosis or infection: Principal | ICD-10-CM | POA: Diagnosis present

## 2021-04-09 DIAGNOSIS — Z807 Family history of other malignant neoplasms of lymphoid, hematopoietic and related tissues: Secondary | ICD-10-CM

## 2021-04-09 DIAGNOSIS — Z8679 Personal history of other diseases of the circulatory system: Secondary | ICD-10-CM

## 2021-04-09 DIAGNOSIS — Z79899 Other long term (current) drug therapy: Secondary | ICD-10-CM

## 2021-04-09 DIAGNOSIS — Z20822 Contact with and (suspected) exposure to covid-19: Secondary | ICD-10-CM | POA: Diagnosis present

## 2021-04-09 DIAGNOSIS — Z6833 Body mass index (BMI) 33.0-33.9, adult: Secondary | ICD-10-CM

## 2021-04-09 DIAGNOSIS — I9589 Other hypotension: Secondary | ICD-10-CM

## 2021-04-09 DIAGNOSIS — I10 Essential (primary) hypertension: Secondary | ICD-10-CM | POA: Diagnosis present

## 2021-04-09 DIAGNOSIS — G8929 Other chronic pain: Secondary | ICD-10-CM | POA: Diagnosis present

## 2021-04-09 DIAGNOSIS — K7030 Alcoholic cirrhosis of liver without ascites: Secondary | ICD-10-CM | POA: Diagnosis present

## 2021-04-09 DIAGNOSIS — K859 Acute pancreatitis without necrosis or infection, unspecified: Secondary | ICD-10-CM | POA: Diagnosis not present

## 2021-04-09 DIAGNOSIS — Z8261 Family history of arthritis: Secondary | ICD-10-CM

## 2021-04-09 DIAGNOSIS — Z8619 Personal history of other infectious and parasitic diseases: Secondary | ICD-10-CM

## 2021-04-09 DIAGNOSIS — K7460 Unspecified cirrhosis of liver: Secondary | ICD-10-CM | POA: Diagnosis present

## 2021-04-09 DIAGNOSIS — Z7951 Long term (current) use of inhaled steroids: Secondary | ICD-10-CM

## 2021-04-09 DIAGNOSIS — F1020 Alcohol dependence, uncomplicated: Secondary | ICD-10-CM | POA: Diagnosis present

## 2021-04-09 DIAGNOSIS — I959 Hypotension, unspecified: Secondary | ICD-10-CM

## 2021-04-09 DIAGNOSIS — Z888 Allergy status to other drugs, medicaments and biological substances status: Secondary | ICD-10-CM

## 2021-04-09 DIAGNOSIS — R55 Syncope and collapse: Secondary | ICD-10-CM | POA: Diagnosis present

## 2021-04-09 DIAGNOSIS — M25569 Pain in unspecified knee: Secondary | ICD-10-CM | POA: Diagnosis present

## 2021-04-09 DIAGNOSIS — R079 Chest pain, unspecified: Secondary | ICD-10-CM

## 2021-04-09 DIAGNOSIS — K219 Gastro-esophageal reflux disease without esophagitis: Secondary | ICD-10-CM | POA: Diagnosis present

## 2021-04-09 DIAGNOSIS — E669 Obesity, unspecified: Secondary | ICD-10-CM | POA: Diagnosis present

## 2021-04-09 DIAGNOSIS — J449 Chronic obstructive pulmonary disease, unspecified: Secondary | ICD-10-CM | POA: Diagnosis present

## 2021-04-09 DIAGNOSIS — F1010 Alcohol abuse, uncomplicated: Secondary | ICD-10-CM | POA: Diagnosis present

## 2021-04-09 DIAGNOSIS — E869 Volume depletion, unspecified: Secondary | ICD-10-CM | POA: Diagnosis present

## 2021-04-09 DIAGNOSIS — R0789 Other chest pain: Secondary | ICD-10-CM | POA: Diagnosis present

## 2021-04-09 DIAGNOSIS — R202 Paresthesia of skin: Secondary | ICD-10-CM | POA: Diagnosis present

## 2021-04-09 DIAGNOSIS — F17210 Nicotine dependence, cigarettes, uncomplicated: Secondary | ICD-10-CM | POA: Diagnosis present

## 2021-04-09 MED ORDER — SODIUM CHLORIDE 0.9 % IV BOLUS
1000.0000 mL | Freq: Once | INTRAVENOUS | Status: AC
  Administered 2021-04-10: 1000 mL via INTRAVENOUS

## 2021-04-09 MED ORDER — SODIUM CHLORIDE 0.9 % IV SOLN: INTRAVENOUS | Status: DC

## 2021-04-09 NOTE — ED Triage Notes (Signed)
Pt coming from home with EMS with sudden onset near syncope. Pt spouse took BP and it was 50 systolic. Pt last took BP meds at 11am. Pt reports some chest tightness; denies blood in stool and urine. Hx esophageal varices.
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
## 2021-04-10 ENCOUNTER — Emergency Department (HOSPITAL_COMMUNITY): Payer: Medicare Other

## 2021-04-10 ENCOUNTER — Encounter (HOSPITAL_COMMUNITY): Payer: Self-pay | Admitting: Family Medicine

## 2021-04-10 ENCOUNTER — Inpatient Hospital Stay (HOSPITAL_COMMUNITY): Payer: Medicare Other

## 2021-04-10 ENCOUNTER — Other Ambulatory Visit: Payer: Self-pay

## 2021-04-10 DIAGNOSIS — K859 Acute pancreatitis without necrosis or infection, unspecified: Secondary | ICD-10-CM | POA: Diagnosis present

## 2021-04-10 DIAGNOSIS — Z807 Family history of other malignant neoplasms of lymphoid, hematopoietic and related tissues: Secondary | ICD-10-CM | POA: Diagnosis not present

## 2021-04-10 DIAGNOSIS — Z8261 Family history of arthritis: Secondary | ICD-10-CM | POA: Diagnosis not present

## 2021-04-10 DIAGNOSIS — Z20822 Contact with and (suspected) exposure to covid-19: Secondary | ICD-10-CM | POA: Diagnosis present

## 2021-04-10 DIAGNOSIS — K852 Alcohol induced acute pancreatitis without necrosis or infection: Principal | ICD-10-CM

## 2021-04-10 DIAGNOSIS — I959 Hypotension, unspecified: Secondary | ICD-10-CM

## 2021-04-10 DIAGNOSIS — M25569 Pain in unspecified knee: Secondary | ICD-10-CM | POA: Diagnosis present

## 2021-04-10 DIAGNOSIS — R202 Paresthesia of skin: Secondary | ICD-10-CM | POA: Diagnosis present

## 2021-04-10 DIAGNOSIS — F1020 Alcohol dependence, uncomplicated: Secondary | ICD-10-CM | POA: Diagnosis present

## 2021-04-10 DIAGNOSIS — Z8679 Personal history of other diseases of the circulatory system: Secondary | ICD-10-CM | POA: Diagnosis not present

## 2021-04-10 DIAGNOSIS — Z6833 Body mass index (BMI) 33.0-33.9, adult: Secondary | ICD-10-CM | POA: Diagnosis not present

## 2021-04-10 DIAGNOSIS — E869 Volume depletion, unspecified: Secondary | ICD-10-CM | POA: Diagnosis present

## 2021-04-10 DIAGNOSIS — F17210 Nicotine dependence, cigarettes, uncomplicated: Secondary | ICD-10-CM | POA: Diagnosis present

## 2021-04-10 DIAGNOSIS — I10 Essential (primary) hypertension: Secondary | ICD-10-CM | POA: Diagnosis present

## 2021-04-10 DIAGNOSIS — R0789 Other chest pain: Secondary | ICD-10-CM | POA: Diagnosis present

## 2021-04-10 DIAGNOSIS — G8929 Other chronic pain: Secondary | ICD-10-CM | POA: Diagnosis present

## 2021-04-10 DIAGNOSIS — J449 Chronic obstructive pulmonary disease, unspecified: Secondary | ICD-10-CM | POA: Diagnosis present

## 2021-04-10 DIAGNOSIS — Z7951 Long term (current) use of inhaled steroids: Secondary | ICD-10-CM | POA: Diagnosis not present

## 2021-04-10 DIAGNOSIS — R0609 Other forms of dyspnea: Secondary | ICD-10-CM | POA: Diagnosis not present

## 2021-04-10 DIAGNOSIS — K219 Gastro-esophageal reflux disease without esophagitis: Secondary | ICD-10-CM | POA: Diagnosis present

## 2021-04-10 DIAGNOSIS — Z8619 Personal history of other infectious and parasitic diseases: Secondary | ICD-10-CM | POA: Diagnosis not present

## 2021-04-10 DIAGNOSIS — K7030 Alcoholic cirrhosis of liver without ascites: Secondary | ICD-10-CM | POA: Diagnosis present

## 2021-04-10 DIAGNOSIS — R55 Syncope and collapse: Secondary | ICD-10-CM | POA: Diagnosis present

## 2021-04-10 DIAGNOSIS — E669 Obesity, unspecified: Secondary | ICD-10-CM | POA: Diagnosis present

## 2021-04-10 DIAGNOSIS — R079 Chest pain, unspecified: Secondary | ICD-10-CM

## 2021-04-10 DIAGNOSIS — Z79899 Other long term (current) drug therapy: Secondary | ICD-10-CM | POA: Diagnosis not present

## 2021-04-10 DIAGNOSIS — Z888 Allergy status to other drugs, medicaments and biological substances status: Secondary | ICD-10-CM | POA: Diagnosis not present

## 2021-04-10 LAB — CBC
HCT: 33.2 % — ABNORMAL LOW (ref 39.0–52.0)
HCT: 34.3 % — ABNORMAL LOW (ref 39.0–52.0)
Hemoglobin: 11 g/dL — ABNORMAL LOW (ref 13.0–17.0)
Hemoglobin: 11.3 g/dL — ABNORMAL LOW (ref 13.0–17.0)
MCH: 31 pg (ref 26.0–34.0)
MCH: 31.4 pg (ref 26.0–34.0)
MCHC: 32.9 g/dL (ref 30.0–36.0)
MCHC: 33.1 g/dL (ref 30.0–36.0)
MCV: 93.5 fL (ref 80.0–100.0)
MCV: 95.3 fL (ref 80.0–100.0)
Platelets: 103 10*3/uL — ABNORMAL LOW (ref 150–400)
Platelets: 89 10*3/uL — ABNORMAL LOW (ref 150–400)
RBC: 3.55 MIL/uL — ABNORMAL LOW (ref 4.22–5.81)
RBC: 3.6 MIL/uL — ABNORMAL LOW (ref 4.22–5.81)
RDW: 15.9 % — ABNORMAL HIGH (ref 11.5–15.5)
RDW: 15.9 % — ABNORMAL HIGH (ref 11.5–15.5)
WBC: 6.7 10*3/uL (ref 4.0–10.5)
WBC: 8.4 10*3/uL (ref 4.0–10.5)
nRBC: 0 % (ref 0.0–0.2)
nRBC: 0 % (ref 0.0–0.2)

## 2021-04-10 LAB — I-STAT CHEM 8, ED
BUN: 9 mg/dL (ref 6–20)
Calcium, Ion: 1.07 mmol/L — ABNORMAL LOW (ref 1.15–1.40)
Chloride: 103 mmol/L (ref 98–111)
Creatinine, Ser: 1.2 mg/dL (ref 0.61–1.24)
Glucose, Bld: 94 mg/dL (ref 70–99)
HCT: 35 % — ABNORMAL LOW (ref 39.0–52.0)
Hemoglobin: 11.9 g/dL — ABNORMAL LOW (ref 13.0–17.0)
Potassium: 3.9 mmol/L (ref 3.5–5.1)
Sodium: 136 mmol/L (ref 135–145)
TCO2: 22 mmol/L (ref 22–32)

## 2021-04-10 LAB — BASIC METABOLIC PANEL
Anion gap: 6 (ref 5–15)
BUN: 8 mg/dL (ref 6–20)
CO2: 22 mmol/L (ref 22–32)
Calcium: 8.1 mg/dL — ABNORMAL LOW (ref 8.9–10.3)
Chloride: 110 mmol/L (ref 98–111)
Creatinine, Ser: 0.79 mg/dL (ref 0.61–1.24)
GFR, Estimated: >60 mL/min (ref >60)
Glucose, Bld: 110 mg/dL — ABNORMAL HIGH (ref 70–99)
Potassium: 4.1 mmol/L (ref 3.5–5.1)
Sodium: 138 mmol/L (ref 135–145)

## 2021-04-10 LAB — COMPREHENSIVE METABOLIC PANEL
ALT: 39 U/L (ref 0–44)
AST: 46 U/L — ABNORMAL HIGH (ref 15–41)
Albumin: 3.2 g/dL — ABNORMAL LOW (ref 3.5–5.0)
Alkaline Phosphatase: 49 U/L (ref 38–126)
Anion gap: 8 (ref 5–15)
BUN: 9 mg/dL (ref 6–20)
CO2: 22 mmol/L (ref 22–32)
Calcium: 8.3 mg/dL — ABNORMAL LOW (ref 8.9–10.3)
Chloride: 104 mmol/L (ref 98–111)
Creatinine, Ser: 1.13 mg/dL (ref 0.61–1.24)
GFR, Estimated: >60 mL/min (ref >60)
Glucose, Bld: 95 mg/dL (ref 70–99)
Potassium: 3.9 mmol/L (ref 3.5–5.1)
Sodium: 134 mmol/L — ABNORMAL LOW (ref 135–145)
Total Bilirubin: 0.6 mg/dL (ref 0.3–1.2)
Total Protein: 5.9 g/dL — ABNORMAL LOW (ref 6.5–8.1)

## 2021-04-10 LAB — ECHOCARDIOGRAM COMPLETE
Area-P 1/2: 3.48 cm2
Height: 69 in
S' Lateral: 2.8 cm
Weight: 3760 oz

## 2021-04-10 LAB — HIV ANTIBODY (ROUTINE TESTING W REFLEX): HIV Screen 4th Generation wRfx: NONREACTIVE

## 2021-04-10 LAB — TROPONIN I (HIGH SENSITIVITY)
Troponin I (High Sensitivity): 4 ng/L (ref <18)
Troponin I (High Sensitivity): 4 ng/L (ref <18)
Troponin I (High Sensitivity): 4 ng/L (ref <18)
Troponin I (High Sensitivity): 4 ng/L (ref <18)

## 2021-04-10 LAB — MAGNESIUM: Magnesium: 1.7 mg/dL (ref 1.7–2.4)

## 2021-04-10 LAB — TSH: TSH: 4.207 u[IU]/mL (ref 0.350–4.500)

## 2021-04-10 LAB — SARS CORONAVIRUS 2 (TAT 6-24 HRS): SARS Coronavirus 2: NEGATIVE

## 2021-04-10 LAB — LIPASE, BLOOD: Lipase: 60 U/L — ABNORMAL HIGH (ref 11–51)

## 2021-04-10 LAB — BRAIN NATRIURETIC PEPTIDE: B Natriuretic Peptide: 81.6 pg/mL (ref 0.0–100.0)

## 2021-04-10 LAB — CBG MONITORING, ED: Glucose-Capillary: 106 mg/dL — ABNORMAL HIGH (ref 70–99)

## 2021-04-10 MED ORDER — FOLIC ACID 1 MG PO TABS
1.0000 mg | ORAL_TABLET | Freq: Every day | ORAL | Status: DC
  Administered 2021-04-10 – 2021-04-11 (×2): 1 mg via ORAL
  Filled 2021-04-10 (×2): qty 1

## 2021-04-10 MED ORDER — THIAMINE HCL 100 MG PO TABS
100.0000 mg | ORAL_TABLET | Freq: Every day | ORAL | Status: DC
  Administered 2021-04-10 – 2021-04-11 (×2): 100 mg via ORAL
  Filled 2021-04-10 (×3): qty 1

## 2021-04-10 MED ORDER — TAMSULOSIN HCL 0.4 MG PO CAPS
0.4000 mg | ORAL_CAPSULE | Freq: Every day | ORAL | Status: DC
  Administered 2021-04-10 – 2021-04-11 (×2): 0.4 mg via ORAL
  Filled 2021-04-10 (×2): qty 1

## 2021-04-10 MED ORDER — MONTELUKAST SODIUM 10 MG PO TABS
10.0000 mg | ORAL_TABLET | Freq: Every day | ORAL | Status: DC
  Administered 2021-04-10: 10 mg via ORAL
  Filled 2021-04-10: qty 1

## 2021-04-10 MED ORDER — LORAZEPAM 2 MG/ML IJ SOLN: 0.0000 mg | Freq: Two times a day (BID) | INTRAMUSCULAR | Status: DC

## 2021-04-10 MED ORDER — VENLAFAXINE HCL ER 75 MG PO CP24
75.0000 mg | ORAL_CAPSULE | Freq: Every day | ORAL | Status: DC
  Administered 2021-04-10 – 2021-04-11 (×2): 75 mg via ORAL
  Filled 2021-04-10 (×2): qty 1

## 2021-04-10 MED ORDER — CARVEDILOL 6.25 MG PO TABS: 6.2500 mg | ORAL_TABLET | Freq: Two times a day (BID) | ORAL | Status: DC

## 2021-04-10 MED ORDER — ALBUTEROL SULFATE (2.5 MG/3ML) 0.083% IN NEBU: 2.5000 mg | INHALATION_SOLUTION | RESPIRATORY_TRACT | Status: DC | PRN

## 2021-04-10 MED ORDER — HYDROMORPHONE HCL 1 MG/ML IJ SOLN: 1.0000 mg | INTRAMUSCULAR | Status: DC | PRN

## 2021-04-10 MED ORDER — CARVEDILOL 6.25 MG PO TABS
6.2500 mg | ORAL_TABLET | Freq: Two times a day (BID) | ORAL | Status: DC
  Administered 2021-04-10 – 2021-04-11 (×3): 6.25 mg via ORAL
  Filled 2021-04-10 (×3): qty 1

## 2021-04-10 MED ORDER — VENLAFAXINE HCL ER 37.5 MG PO CP24: 37.5000 mg | ORAL_CAPSULE | Freq: Every day | ORAL | Status: DC

## 2021-04-10 MED ORDER — THIAMINE HCL 100 MG PO TABS: 100.0000 mg | ORAL_TABLET | Freq: Every day | ORAL | Status: DC

## 2021-04-10 MED ORDER — ACETAMINOPHEN 650 MG RE SUPP: 650.0000 mg | Freq: Four times a day (QID) | RECTAL | Status: DC | PRN

## 2021-04-10 MED ORDER — ONDANSETRON HCL 4 MG PO TABS: 4.0000 mg | ORAL_TABLET | Freq: Four times a day (QID) | ORAL | Status: DC | PRN

## 2021-04-10 MED ORDER — LOPERAMIDE HCL 2 MG PO CAPS: 2.0000 mg | ORAL_CAPSULE | ORAL | Status: DC | PRN

## 2021-04-10 MED ORDER — HYDROXYZINE HCL 25 MG PO TABS: 25.0000 mg | ORAL_TABLET | Freq: Four times a day (QID) | ORAL | Status: DC | PRN

## 2021-04-10 MED ORDER — ACETAMINOPHEN 325 MG PO TABS: 650.0000 mg | ORAL_TABLET | Freq: Four times a day (QID) | ORAL | Status: DC | PRN

## 2021-04-10 MED ORDER — SENNOSIDES-DOCUSATE SODIUM 8.6-50 MG PO TABS: 1.0000 | ORAL_TABLET | Freq: Every evening | ORAL | Status: DC | PRN

## 2021-04-10 MED ORDER — ENOXAPARIN SODIUM 40 MG/0.4ML IJ SOSY
40.0000 mg | PREFILLED_SYRINGE | INTRAMUSCULAR | Status: DC
  Administered 2021-04-10 – 2021-04-11 (×2): 40 mg via SUBCUTANEOUS
  Filled 2021-04-10 (×2): qty 0.4

## 2021-04-10 MED ORDER — ADULT MULTIVITAMIN W/MINERALS CH: 1.0000 | ORAL_TABLET | Freq: Every day | ORAL | Status: DC

## 2021-04-10 MED ORDER — ONDANSETRON HCL 4 MG/2ML IJ SOLN: 4.0000 mg | Freq: Four times a day (QID) | INTRAMUSCULAR | Status: DC | PRN

## 2021-04-10 MED ORDER — LACTATED RINGERS IV SOLN: INTRAVENOUS | Status: DC

## 2021-04-10 MED ORDER — IPRATROPIUM-ALBUTEROL 0.5-2.5 (3) MG/3ML IN SOLN
3.0000 mL | Freq: Two times a day (BID) | RESPIRATORY_TRACT | Status: DC
  Administered 2021-04-11: 3 mL via RESPIRATORY_TRACT
  Filled 2021-04-10: qty 3

## 2021-04-10 MED ORDER — MAGNESIUM SULFATE 2 GM/50ML IV SOLN
2.0000 g | Freq: Once | INTRAVENOUS | Status: AC
  Administered 2021-04-10: 2 g via INTRAVENOUS
  Filled 2021-04-10: qty 50

## 2021-04-10 MED ORDER — LORAZEPAM 2 MG/ML IJ SOLN
0.0000 mg | Freq: Four times a day (QID) | INTRAMUSCULAR | Status: DC
Start: 2021-04-10 — End: 2021-04-11
  Administered 2021-04-10: 1 mg via INTRAVENOUS
  Filled 2021-04-10: qty 1

## 2021-04-10 MED ORDER — SODIUM CHLORIDE 0.9 % IV BOLUS
1000.0000 mL | Freq: Once | INTRAVENOUS | Status: AC
  Administered 2021-04-10: 1000 mL via INTRAVENOUS

## 2021-04-10 MED ORDER — CHLORDIAZEPOXIDE HCL 25 MG PO CAPS: 25.0000 mg | ORAL_CAPSULE | Freq: Four times a day (QID) | ORAL | Status: DC | PRN

## 2021-04-10 MED ORDER — ADULT MULTIVITAMIN W/MINERALS CH
1.0000 | ORAL_TABLET | Freq: Every day | ORAL | Status: DC
  Administered 2021-04-10 – 2021-04-11 (×2): 1 via ORAL
  Filled 2021-04-10 (×2): qty 1

## 2021-04-10 MED ORDER — THIAMINE HCL 100 MG/ML IJ SOLN
100.0000 mg | Freq: Once | INTRAMUSCULAR | Status: AC
  Administered 2021-04-10: 100 mg via INTRAMUSCULAR
  Filled 2021-04-10: qty 2

## 2021-04-10 MED ORDER — ONDANSETRON 4 MG PO TBDP: 4.0000 mg | ORAL_TABLET | Freq: Four times a day (QID) | ORAL | Status: DC | PRN

## 2021-04-10 MED ORDER — TRAZODONE HCL 50 MG PO TABS
50.0000 mg | ORAL_TABLET | Freq: Every evening | ORAL | Status: DC | PRN
  Administered 2021-04-10: 50 mg via ORAL
  Filled 2021-04-10: qty 1

## 2021-04-10 MED ORDER — FLUTICASONE FUROATE-VILANTEROL 200-25 MCG/INH IN AEPB
1.0000 | INHALATION_SPRAY | Freq: Every day | RESPIRATORY_TRACT | Status: DC
  Administered 2021-04-10 – 2021-04-11 (×2): 1 via RESPIRATORY_TRACT
  Filled 2021-04-10: qty 28

## 2021-04-10 MED ORDER — NICOTINE 14 MG/24HR TD PT24
14.0000 mg | MEDICATED_PATCH | Freq: Every day | TRANSDERMAL | Status: DC
  Administered 2021-04-10 – 2021-04-11 (×2): 14 mg via TRANSDERMAL
  Filled 2021-04-10 (×2): qty 1

## 2021-04-10 MED ORDER — ALBUTEROL SULFATE HFA 108 (90 BASE) MCG/ACT IN AERS: 2.0000 | INHALATION_SPRAY | RESPIRATORY_TRACT | Status: DC | PRN

## 2021-04-10 MED ORDER — THIAMINE HCL 100 MG/ML IJ SOLN
100.0000 mg | Freq: Every day | INTRAMUSCULAR | Status: DC
  Filled 2021-04-10 (×2): qty 2

## 2021-04-10 MED ORDER — AZELASTINE HCL 0.1 % NA SOLN
1.0000 | Freq: Every day | NASAL | Status: DC
  Administered 2021-04-10 – 2021-04-11 (×2): 1 via NASAL
  Filled 2021-04-10: qty 30

## 2021-04-10 MED ORDER — IPRATROPIUM-ALBUTEROL 0.5-2.5 (3) MG/3ML IN SOLN
3.0000 mL | Freq: Four times a day (QID) | RESPIRATORY_TRACT | Status: DC
  Administered 2021-04-10 (×2): 3 mL via RESPIRATORY_TRACT
  Filled 2021-04-10 (×2): qty 3

## 2021-04-10 NOTE — Progress Notes (Signed)
Patient seen and examined.  Admitted early morning hours by nighttime hospitalist.  Very interesting gentleman with extensive medical issues, ongoing alcohol use.  He drinks about 18 pack a day and last few days he was only drinking 10 packs a day of beer, was sitting in the couch and watching TV when he felt generalized fatigue, shortness of breath and chest discomfort and mild epigastric pain.  His wife checked his blood pressure and found it was very low.  Patient reported taking all his blood pressure medications on the day of admission.  Blood pressure initially on arrival 70-85 range.  Given 4 L normal saline bolus with improvement.  Found to have mild pancreatitis and dehydration and admitted to the hospital.  Patient seen and examined.  Has mild epigastric pain, denies any nausea vomiting or chest pain.  His main complaint was weakness rather than abdominal pain when he came to the hospital.  Wants something for sleep tonight.  Acute alcoholic pancreatitis: Symptoms mostly controlled.  Continue liquid diet today.  Continue maintenance IV fluid.  Holding all antihypertensive medications.  Recheck electrolytes and lipase level tomorrow morning.  Replace magnesium today.  Alcohol use with a high risk of alcohol withdrawal: On high-dose CIWA scale.  This is no charge visit.

## 2021-04-10 NOTE — Progress Notes (Signed)
2D Echocardiogram has been performed.  Levi Alvarez 04/10/2021, 11:49 AM

## 2021-04-10 NOTE — ED Notes (Signed)
2L completed via pressure bag
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
## 2021-04-11 DIAGNOSIS — K852 Alcohol induced acute pancreatitis without necrosis or infection: Secondary | ICD-10-CM | POA: Diagnosis not present

## 2021-04-11 LAB — CBC WITH DIFFERENTIAL/PLATELET
Abs Immature Granulocytes: 0.02 10*3/uL (ref 0.00–0.07)
Basophils Absolute: 0.1 10*3/uL (ref 0.0–0.1)
Basophils Relative: 1 %
Eosinophils Absolute: 0.1 10*3/uL (ref 0.0–0.5)
Eosinophils Relative: 1 %
HCT: 36.9 % — ABNORMAL LOW (ref 39.0–52.0)
Hemoglobin: 12.2 g/dL — ABNORMAL LOW (ref 13.0–17.0)
Immature Granulocytes: 0 %
Lymphocytes Relative: 21 %
Lymphs Abs: 1.6 10*3/uL (ref 0.7–4.0)
MCH: 31.2 pg (ref 26.0–34.0)
MCHC: 33.1 g/dL (ref 30.0–36.0)
MCV: 94.4 fL (ref 80.0–100.0)
Monocytes Absolute: 1 10*3/uL (ref 0.1–1.0)
Monocytes Relative: 12 %
Neutro Abs: 5.1 10*3/uL (ref 1.7–7.7)
Neutrophils Relative %: 65 %
Platelets: 80 10*3/uL — ABNORMAL LOW (ref 150–400)
RBC: 3.91 MIL/uL — ABNORMAL LOW (ref 4.22–5.81)
RDW: 16 % — ABNORMAL HIGH (ref 11.5–15.5)
WBC: 7.8 10*3/uL (ref 4.0–10.5)
nRBC: 0 % (ref 0.0–0.2)

## 2021-04-11 LAB — LIPASE, BLOOD: Lipase: 39 U/L (ref 11–51)

## 2021-04-11 LAB — COMPREHENSIVE METABOLIC PANEL
ALT: 38 U/L (ref 0–44)
AST: 45 U/L — ABNORMAL HIGH (ref 15–41)
Albumin: 3.4 g/dL — ABNORMAL LOW (ref 3.5–5.0)
Alkaline Phosphatase: 58 U/L (ref 38–126)
Anion gap: 8 (ref 5–15)
BUN: 6 mg/dL (ref 6–20)
CO2: 27 mmol/L (ref 22–32)
Calcium: 8.7 mg/dL — ABNORMAL LOW (ref 8.9–10.3)
Chloride: 101 mmol/L (ref 98–111)
Creatinine, Ser: 0.78 mg/dL (ref 0.61–1.24)
GFR, Estimated: >60 mL/min (ref >60)
Glucose, Bld: 89 mg/dL (ref 70–99)
Potassium: 4.1 mmol/L (ref 3.5–5.1)
Sodium: 136 mmol/L (ref 135–145)
Total Bilirubin: 1.2 mg/dL (ref 0.3–1.2)
Total Protein: 6.4 g/dL — ABNORMAL LOW (ref 6.5–8.1)

## 2021-04-11 LAB — PHOSPHORUS: Phosphorus: 3 mg/dL (ref 2.5–4.6)

## 2021-04-11 LAB — MAGNESIUM: Magnesium: 1.9 mg/dL (ref 1.7–2.4)
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
## 2021-04-11 NOTE — Plan of Care (Signed)

## 2021-04-11 NOTE — Hospital Course (Signed)
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 Havery Moros) Chronic ethanolism 18 beers/day OSA Chronic knee pain On disability  Near syncopal episode 04/09/2021-reported R SBP = 50? + Chest tightness- Bedside echocardiogram no pericardial effusion Rx 500 cc IVF  Work-up = troponin negative (4), BNP 81, initial lipase 60-->39, hemoglobin 12.2, platelet down from baseline 133-->80 WBC 7.8 CT ABD/PE = acute pancreatitis/liver cirrhosis  Rx for liter IVF with improvement in BP, CIWA = highest 6   BUN/creatinine 6/0.7 Lipase now 39 AST/ALT 45/38 Platelet 80

## 2021-04-11 NOTE — Progress Notes (Signed)
D/C instructions given and reviewed. Instructed to call primary RN when ready for D/C

## 2021-04-12 ENCOUNTER — Telehealth: Payer: Self-pay

## 2021-04-12 DIAGNOSIS — K7460 Unspecified cirrhosis of liver: Secondary | ICD-10-CM

## 2021-04-12 NOTE — Telephone Encounter (Signed)
Transition Care Management Follow-up Telephone Call  Date of discharge and from where: 04/11/2021, [facility]   How have you been since you were released from the hospital? The patient said that he is doing good.    Any questions or concerns? No  Items Reviewed:  Did the pt receive and understand the discharge instructions provided? Yes   Medications obtained and verified? Yes  - he aware of the change with carvedilol.  He said that he didn't have any questions about his med regime.   Other? No    He said that he has a new PCP- Dustin Folks, NP  at [facility] and has a follow up appointment scheduled with him.

## 2021-04-12 NOTE — Telephone Encounter (Signed)
Called and spoke to [place] in scheduling.  Patient scheduled for Monday, June 27th at 9:00am, arr 8:45 am and NPO after midnight. MyChart message sent.

## 2021-04-12 NOTE — Telephone Encounter (Signed)
-----   Message from Yetta Flock, MD sent at 04/12/2021  7:49 AM EDT ----- Regarding: RE: due for RUQ u/s in 6-22 Thanks Jan. The CT was without contrast, so he will still need an [place] in June. Thanks  ----- Message ----- From: Roetta Sessions, CMA Sent: 04/11/2021   4:49 PM EDT To: Yetta Flock, MD Subject: FW: due for RUQ u/s in 6-22                    Patient due for [facility] screening, Patient is currently admitted and had CT scan yesterday. Please advise.  Thanks, Jan   ----- Message ----- From: Roetta Sessions, CMA Sent: 04/11/2021  12:00 AM EDT To: Roetta Sessions, CMA Subject: due for RUQ u/s in 6-22                        Due for RUQ U/S in  mid 04-2021 For [place] screening. Per patient ok to send appointment information through [place]

## 2021-04-19 ENCOUNTER — Inpatient Hospital Stay (HOSPITAL_COMMUNITY)
Admission: EM | Admit: 2021-04-19 | Discharge: 2021-04-24 | DRG: 896 | Disposition: A | Discharge status: Home / Self Care | Payer: Medicare Other | Attending: Internal Medicine | Admitting: Internal Medicine

## 2021-04-19 ENCOUNTER — Other Ambulatory Visit: Payer: Self-pay

## 2021-04-19 ENCOUNTER — Encounter (HOSPITAL_COMMUNITY): Payer: Self-pay

## 2021-04-19 DIAGNOSIS — Z888 Allergy status to other drugs, medicaments and biological substances status: Secondary | ICD-10-CM

## 2021-04-19 DIAGNOSIS — Z6835 Body mass index (BMI) 35.0-35.9, adult: Secondary | ICD-10-CM

## 2021-04-19 DIAGNOSIS — Z8261 Family history of arthritis: Secondary | ICD-10-CM

## 2021-04-19 DIAGNOSIS — N400 Enlarged prostate without lower urinary tract symptoms: Secondary | ICD-10-CM | POA: Diagnosis present

## 2021-04-19 DIAGNOSIS — F10231 Alcohol dependence with withdrawal delirium: Secondary | ICD-10-CM | POA: Diagnosis not present

## 2021-04-19 DIAGNOSIS — F32A Depression, unspecified: Secondary | ICD-10-CM | POA: Diagnosis present

## 2021-04-19 DIAGNOSIS — Z808 Family history of malignant neoplasm of other organs or systems: Secondary | ICD-10-CM

## 2021-04-19 DIAGNOSIS — E6609 Other obesity due to excess calories: Secondary | ICD-10-CM | POA: Diagnosis present

## 2021-04-19 DIAGNOSIS — I119 Hypertensive heart disease without heart failure: Secondary | ICD-10-CM | POA: Diagnosis present

## 2021-04-19 DIAGNOSIS — K429 Umbilical hernia without obstruction or gangrene: Secondary | ICD-10-CM | POA: Diagnosis present

## 2021-04-19 DIAGNOSIS — Z7951 Long term (current) use of inhaled steroids: Secondary | ICD-10-CM

## 2021-04-19 DIAGNOSIS — B1920 Unspecified viral hepatitis C without hepatic coma: Secondary | ICD-10-CM | POA: Diagnosis present

## 2021-04-19 DIAGNOSIS — T4275XA Adverse effect of unspecified antiepileptic and sedative-hypnotic drugs, initial encounter: Secondary | ICD-10-CM | POA: Diagnosis not present

## 2021-04-19 DIAGNOSIS — Z781 Physical restraint status: Secondary | ICD-10-CM

## 2021-04-19 DIAGNOSIS — G4733 Obstructive sleep apnea (adult) (pediatric): Secondary | ICD-10-CM | POA: Diagnosis present

## 2021-04-19 DIAGNOSIS — Z28310 Unvaccinated for covid-19: Secondary | ICD-10-CM

## 2021-04-19 DIAGNOSIS — R578 Other shock: Secondary | ICD-10-CM | POA: Diagnosis not present

## 2021-04-19 DIAGNOSIS — E871 Hypo-osmolality and hyponatremia: Secondary | ICD-10-CM | POA: Diagnosis present

## 2021-04-19 DIAGNOSIS — Z79899 Other long term (current) drug therapy: Secondary | ICD-10-CM

## 2021-04-19 DIAGNOSIS — I8510 Secondary esophageal varices without bleeding: Secondary | ICD-10-CM | POA: Diagnosis present

## 2021-04-19 DIAGNOSIS — F1210 Cannabis abuse, uncomplicated: Secondary | ICD-10-CM | POA: Diagnosis present

## 2021-04-19 DIAGNOSIS — E785 Hyperlipidemia, unspecified: Secondary | ICD-10-CM | POA: Diagnosis present

## 2021-04-19 DIAGNOSIS — K219 Gastro-esophageal reflux disease without esophagitis: Secondary | ICD-10-CM

## 2021-04-19 DIAGNOSIS — F17210 Nicotine dependence, cigarettes, uncomplicated: Secondary | ICD-10-CM | POA: Diagnosis present

## 2021-04-19 DIAGNOSIS — E66812 Other obesity due to excess calories: Secondary | ICD-10-CM

## 2021-04-19 DIAGNOSIS — J449 Chronic obstructive pulmonary disease, unspecified: Secondary | ICD-10-CM | POA: Diagnosis present

## 2021-04-19 DIAGNOSIS — K7030 Alcoholic cirrhosis of liver without ascites: Secondary | ICD-10-CM | POA: Diagnosis present

## 2021-04-19 DIAGNOSIS — K7460 Unspecified cirrhosis of liver: Secondary | ICD-10-CM | POA: Diagnosis present

## 2021-04-19 DIAGNOSIS — J9600 Acute respiratory failure, unspecified whether with hypoxia or hypercapnia: Secondary | ICD-10-CM

## 2021-04-19 DIAGNOSIS — Y906 Blood alcohol level of 120-199 mg/100 ml: Secondary | ICD-10-CM | POA: Diagnosis present

## 2021-04-19 DIAGNOSIS — H539 Unspecified visual disturbance: Secondary | ICD-10-CM | POA: Diagnosis present

## 2021-04-19 DIAGNOSIS — M791 Myalgia, unspecified site: Secondary | ICD-10-CM | POA: Diagnosis present

## 2021-04-19 DIAGNOSIS — F10239 Alcohol dependence with withdrawal, unspecified: Secondary | ICD-10-CM | POA: Diagnosis present

## 2021-04-19 DIAGNOSIS — Z20822 Contact with and (suspected) exposure to covid-19: Secondary | ICD-10-CM | POA: Diagnosis present

## 2021-04-19 DIAGNOSIS — I959 Hypotension, unspecified: Secondary | ICD-10-CM | POA: Diagnosis not present

## 2021-04-19 DIAGNOSIS — F10930 Alcohol use, unspecified with withdrawal, uncomplicated: Secondary | ICD-10-CM

## 2021-04-19 DIAGNOSIS — I10 Essential (primary) hypertension: Secondary | ICD-10-CM | POA: Diagnosis present

## 2021-04-19 DIAGNOSIS — F17200 Nicotine dependence, unspecified, uncomplicated: Secondary | ICD-10-CM | POA: Diagnosis present

## 2021-04-19 DIAGNOSIS — K860 Alcohol-induced chronic pancreatitis: Secondary | ICD-10-CM | POA: Diagnosis present

## 2021-04-19 DIAGNOSIS — G9341 Metabolic encephalopathy: Secondary | ICD-10-CM | POA: Diagnosis not present

## 2021-04-19 LAB — I-STAT ARTERIAL BLOOD GAS, ED
Acid-base deficit: 5 mmol/L — ABNORMAL HIGH (ref 0.0–2.0)
Bicarbonate: 20.3 mmol/L (ref 20.0–28.0)
Calcium, Ion: 1.14 mmol/L — ABNORMAL LOW (ref 1.15–1.40)
HCT: 38 % — ABNORMAL LOW (ref 39.0–52.0)
Hemoglobin: 12.9 g/dL — ABNORMAL LOW (ref 13.0–17.0)
O2 Saturation: 94 %
Patient temperature: 97.6
Potassium: 3.4 mmol/L — ABNORMAL LOW (ref 3.5–5.1)
Sodium: 131 mmol/L — ABNORMAL LOW (ref 135–145)
TCO2: 21 mmol/L — ABNORMAL LOW (ref 22–32)
pCO2 arterial: 36.7 mmHg (ref 32.0–48.0)
pH, Arterial: 7.348 — ABNORMAL LOW (ref 7.350–7.450)
pO2, Arterial: 70 mmHg — ABNORMAL LOW (ref 83.0–108.0)

## 2021-04-19 MED ORDER — SODIUM CHLORIDE 0.9 % IV SOLN: Freq: Once | INTRAVENOUS | Status: AC

## 2021-04-19 NOTE — ED Notes (Signed)
resp called ref lab work

## 2021-04-19 NOTE — ED Triage Notes (Signed)
Brought from home via [facility] for weakness and malaise for 2hrs. Hx of hypotensive episodes pancreatitis, ETOH use. Admits to 10 beers today. Per pt just feels tired and sore all over at this time

## 2021-04-19 NOTE — ED Provider Notes (Signed)
[facility] EMERGENCY DEPARTMENT Provider Note   CSN: [id number] Arrival date & time: 04/19/21  2227     History Chief Complaint  Patient presents with   Weakness    Levi Alvarez is a 50 y.o. male.  Patient with history of COPD, HTN, alcoholism with complications of cirrhosis (no history of ascites/paracentesis), esophageal varices, pancreatitis with recent admission for pancreatitis. Per SO at bedside, the patient's presenting symptom on last admission with hypotension. He denies abdominal pain, nausea, vomiting, chest pain, SOB, cough or fever. He is reported to have resumed drinking on discharge from the hospital 9 days ago with 2 days of binge drinking and regular use since. She reports the patient became lightheaded, weak, found hypotensive by EMS. His only complaint at this time is generalized aches and fatigue.   The history is provided by the patient and the spouse. No language interpreter was used.  Weakness Associated symptoms: myalgias   Associated symptoms: no fever       Past Medical History:  Diagnosis Date   Alcoholism ([place])    Blood transfusion without reported diagnosis    jan, 2020 3 units PRBC   Cirrhosis ([place])    secondary to alcohol and hep C   Constipation    Resolved   COPD (chronic obstructive pulmonary disease) (HCC)    DDD (degenerative disc disease), cervical    ED (erectile dysfunction)    Esophageal varices (HCC)    GERD (gastroesophageal reflux disease)    H/O: upper GI bleed    Hepatitis C antibody positive in blood    resolved   High blood pressure    History of anemia    Left inguinal hernia 2020   Lung nodule    Small   Sleep apnea    in process of getting CPAP   Substance abuse ([place])    was an alcoholic   Umbilical hernia 8299    Patient Active Problem List   Diagnosis Date Noted   Pancreatitis, acute 04/10/2021   Hypotension 04/10/2021   Chest pain 04/10/2021   Esophageal varices without bleeding ([place])     Abnormal CT scan, colon    Benign neoplasm of colon    Bleeding esophageal varices ([place])    Spondylosis without myelopathy or radiculopathy, cervical region 12/03/2018   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   Post-traumatic osteoarthritis of left knee 12/03/2018   Chronic obstructive pulmonary disease ([place]) 12/02/2018   Upper GI bleed 11/24/2018   Acute blood loss anemia 11/24/2018   Alcohol abuse 11/24/2018   Chronic back pain 11/24/2018   Chronic hepatitis C without hepatic coma ([place]) 10/08/2017   Cirrhosis ([place]) 10/08/2017   Chronic pain of left knee 07/23/2017   Essential hypertension 07/12/2017   OSA (obstructive sleep apnea) 07/12/2017   Class 2 obesity with serious comorbidity and body mass index (BMI) of 35.0 to 35.9 in adult 07/12/2017   Elevated liver enzymes 07/12/2017   Other chronic pain 07/12/2017   Smoker 07/12/2017   Hyperlipidemia 07/12/2017   Polyarthralgia 07/12/2017   Snoring 06/05/2017    Past Surgical History:  Procedure Laterality Date   arm surgery     left arm,   COLONOSCOPY WITH PROPOFOL N/A 01/19/2019   Procedure: COLONOSCOPY WITH PROPOFOL;  Surgeon: Yetta Flock, MD;  Location: [facility];  Service: Gastroenterology;  Laterality: N/A;   ESOPHAGEAL BANDING  11/25/2018   Procedure: ESOPHAGEAL BANDING;  Surgeon: Milus Banister, MD;  Location: [facility];  Service: Endoscopy;;   ESOPHAGEAL BANDING  01/19/2019   Procedure: ESOPHAGEAL BANDING;  Surgeon: Yetta Flock, MD;  Location: [facility];  Service: Gastroenterology;;   ESOPHAGEAL BANDING N/A 01/24/2021   Procedure: ESOPHAGEAL BANDING;  Surgeon: Yetta Flock, MD;  Location: [facility];  Service: Gastroenterology;  Laterality: N/A;   ESOPHAGOGASTRODUODENOSCOPY (EGD) WITH PROPOFOL N/A 11/25/2018   Procedure: ESOPHAGOGASTRODUODENOSCOPY (EGD) WITH PROPOFOL;  Surgeon: Milus Banister, MD;  Location: [facility];  Service: Endoscopy;  Laterality: N/A;   ESOPHAGOGASTRODUODENOSCOPY  (EGD) WITH PROPOFOL N/A 01/19/2019   Procedure: ESOPHAGOGASTRODUODENOSCOPY (EGD) WITH PROPOFOL;  Surgeon: Yetta Flock, MD;  Location: [facility];  Service: Gastroenterology;  Laterality: N/A;   ESOPHAGOGASTRODUODENOSCOPY (EGD) WITH PROPOFOL N/A 04/28/2019   Procedure: ESOPHAGOGASTRODUODENOSCOPY (EGD) WITH PROPOFOL;  Surgeon: Yetta Flock, MD;  Location: [facility];  Service: Gastroenterology;  Laterality: N/A;   ESOPHAGOGASTRODUODENOSCOPY (EGD) WITH PROPOFOL N/A 11/03/2019   Procedure: ESOPHAGOGASTRODUODENOSCOPY (EGD) WITH PROPOFOL;  Surgeon: Doran Stabler, MD;  Location: [facility];  Service: Gastroenterology;  Laterality: N/A;   ESOPHAGOGASTRODUODENOSCOPY (EGD) WITH PROPOFOL N/A 01/24/2021   Procedure: ESOPHAGOGASTRODUODENOSCOPY (EGD) WITH PROPOFOL;  Surgeon: Yetta Flock, MD;  Location: [facility];  Service: Gastroenterology;  Laterality: N/A;   KNEE SURGERY Left    POLYPECTOMY  01/19/2019   Procedure: POLYPECTOMY;  Surgeon: Yetta Flock, MD;  Location: [facility];  Service: Gastroenterology;;       Family History  Problem Relation Age of Onset   Colitis Mother    Arthritis Mother    Cancer Father        lymphoma?   Cancer Paternal Grandfather        bone   Heart disease Neg Hx    Stroke Neg Hx    Diabetes Neg Hx     Social History   Tobacco Use   Smoking status: Current Every Day Smoker    Packs/day: 0.50    Years: 30.00    Pack years: 15.00    Types: Cigarettes   Smokeless tobacco: Former Systems developer    Types: Nurse, children's Use: Never used  Substance Use Topics   Alcohol use: Not Currently    Comment: stopped in 11/2018   Drug use: No    Home Medications Prior to Admission medications   Medication Sig Start Date End Date Taking? Authorizing Provider  acetaminophen (TYLENOL) 500 MG tablet Take 1,000 mg by mouth every 6 (six) hours as needed (pain.).    [provider]  albuterol (PROVENTIL) (2.5 MG/3ML)  0.083% nebulizer solution Take 3 mLs (2.5 mg total) by nebulization every 6 (six) hours as needed for wheezing or shortness of breath. 12/02/18   Gildardo Pounds, NP  azelastine (ASTELIN) 0.1 % nasal spray Place 1 spray into both nostrils daily. 03/21/21   [provider]  buPROPion (WELLBUTRIN SR) 150 MG 12 hr tablet Take 1 tablet (150 mg total) by mouth 2 (two) times daily. Patient not taking: Reported on 04/10/2021 03/28/20 04/27/20  Gildardo Pounds, NP  carvedilol (COREG) 6.25 MG tablet Take 6.25 mg by mouth 2 (two) times daily. 02/01/21   [provider]  fluticasone furoate-vilanterol (BREO ELLIPTA) 200-25 MCG/INH AEPB Inhale 1 puff into the lungs daily. 09/25/19   Gildardo Pounds, NP  loratadine (CLARITIN) 10 MG tablet Take 10 mg by mouth daily.    [provider]  losartan (COZAAR) 100 MG tablet Take 100 mg by mouth daily. 12/24/20   [provider]  Milk Thistle 1000 MG CAPS Take 1,000  mg by mouth 2 (two) times daily.    [provider]  montelukast (SINGULAIR) 10 MG tablet TAKE 1 TABLET (10 MG) BY ORAL ROUTE ONCE DAILY IN THE EVENING FOR ALLERGIC RHINITIS FOR 90 DAYS Patient taking differently: Take 10 mg by mouth at bedtime. 09/15/20 09/15/21  Sonia Side., FNP  Multiple Vitamin (MULTIVITAMIN WITH MINERALS) TABS tablet Take 1 tablet by mouth daily.    [provider]  pantoprazole (PROTONIX) 40 MG tablet TAKE 1 TABLET BY MOUTH 2 (TWO) TIMES DAILY BEFORE A MEAL FOR 30 DAYS. Patient taking differently: Take 40 mg by mouth daily. 01/07/20   Gildardo Pounds, NP  sildenafil (VIAGRA) 50 MG tablet Take 50 mg by mouth daily as needed for erectile dysfunction.    [provider]  spironolactone (ALDACTONE) 100 MG tablet Take 1 tablet (100 mg total) by mouth daily. Patient not taking: No sig reported 04/09/20 07/08/20  Gildardo Pounds, NP  tamsulosin (FLOMAX) 0.4 MG CAPS capsule TAKE 1 CAPSULE (0.4 MG TOTAL) BY MOUTH DAILY. Patient  taking differently: Take 0.4 mg by mouth daily. 08/17/20 08/17/21  Gildardo Pounds, NP  venlafaxine XR (EFFEXOR-XR) 37.5 MG 24 hr capsule TAKE ONE CAPSULE BY MOUTH ONCE A DAY (IN ADDITION TO 75MG  FOR DAILY TOTAL OF 112.5MG ) Patient not taking: No sig reported 08/15/20 08/15/21  Deveron Furlong, NP  venlafaxine XR (EFFEXOR-XR) 75 MG 24 hr capsule TAKE 1 CAPSULE BY MOUTH DAILY. 08/15/20 08/15/21  Deveron Furlong, NP  VENTOLIN HFA 108 (90 Base) MCG/ACT inhaler INHALE 1 PUFF EVERY 6 (SIX) HOURS AS NEEDED INTO THE LUNGS FOR WHEEZING OR SHORTNESS OF BREATH. Patient taking differently: Inhale 2 puffs into the lungs every 6 (six) hours as needed for wheezing or shortness of breath. 05/19/20   Charlott Rakes, MD    Allergies    Gabapentin and Naproxen  Review of Systems   Review of Systems  Constitutional:  Positive for fatigue. Negative for chills and fever.  HENT: Negative.    Respiratory: Negative.    Cardiovascular: Negative.   Gastrointestinal: Negative.   Musculoskeletal:  Positive for myalgias.  Skin: Negative.   Neurological:  Positive for weakness and light-headedness.   Physical Exam Updated Vital Signs BP 110/70   Pulse 72   Temp 97.6 F (36.4 C) (Oral)   Resp 12   Ht 5\' 9"  (1.753 m)   Wt 108.9 kg   SpO2 96%   BMI 35.44 kg/m   Physical Exam Vitals and nursing note reviewed.  Constitutional:      General: He is not in acute distress.    Appearance: Normal appearance.  HENT:     Head: Normocephalic.     Mouth/Throat:     Mouth: Mucous membranes are dry.  Eyes:     General: No scleral icterus. Cardiovascular:     Rate and Rhythm: Normal rate and regular rhythm.     Heart sounds: No murmur heard. Pulmonary:     Effort: Pulmonary effort is normal.     Breath sounds: No wheezing.  Abdominal:     Palpations: Abdomen is soft.     Tenderness: There is no abdominal tenderness. There is no guarding.     Comments: Protuberant  Musculoskeletal:        General: Normal range of  motion.     Cervical back: Normal range of motion and neck supple.  Skin:    General: Skin is warm and dry.  Neurological:     Mental Status: He  is alert and oriented to person, place, and time.    ED Results / Procedures / Treatments   Labs (all labs ordered are listed, but only abnormal results are displayed) Labs Reviewed  RESP PANEL BY RT-PCR (FLU A&B, COVID) ARPGX2  CBC WITH DIFFERENTIAL/PLATELET  LIPASE, BLOOD  COMPREHENSIVE METABOLIC PANEL  URINALYSIS, ROUTINE W REFLEX MICROSCOPIC  ETHANOL  I-STAT ARTERIAL BLOOD GAS, ED    EKG EKG Interpretation  Date/Time:  Wednesday April 19 2021 22:30:50 EDT Ventricular Rate:  72 PR Interval:  159 QRS Duration: 99 QT Interval:  413 QTC Calculation: 452 R Axis:   66 Text Interpretation: Sinus rhythm No significant change since last tracing Confirmed by Theotis Burrow [phone]) on 04/19/2021 10:32:27 PM   Radiology No results found.  Procedures Procedures   Medications Ordered in ED Medications  0.9 %  sodium chloride infusion (has no administration in time range)    ED Course  I have reviewed the triage vital signs and the nursing notes.  Pertinent labs & imaging results that were available during my care of the patient were reviewed by me and considered in my medical decision making (see chart for details).    MDM Rules/Calculators/A&P                          Patient to ED with ss/sxs as per HPI.   Blood pressure in the ED on arrival 117/70. He reports only being "sore all over". No abdominal pain, vomiting.   Chart reviewed. Last admission he was felt to have acute volume depletion secondary to SIRS physiology from pancreatitis. He received 4 liters fluids and improved rapidly.   Aggressive hydration started here. Labs reviewed. Found to have a Na 127, K+ 3.5, CO2 18, Ca 7.8, lipase 60, mild leukocytosis of 11.2, hgb 11.7 stable, ETOH 184.   Blood pressures have remained normal. However, during ED evaluation the  patient developed UE tremors, CIWA score of 8, started on CIWA protocol. He expresses his desire to stop drinking.   Feel he is high risk for deterioration if discharged home. Discussed admission with Dr. Kipp Brood, [facility], who accepts the patient for admission.  Final Clinical Impression(s) / ED Diagnoses Final diagnoses:  None   Alcohol withdrawal Hyponatremia Hypotension  Rx / DC Orders ED Discharge Orders     None        Charlann Lange, PA-C 04/20/21 0131    LongWonda Olds, MD 04/20/21 1715

## 2021-04-20 ENCOUNTER — Encounter (HOSPITAL_COMMUNITY): Payer: Self-pay | Admitting: Internal Medicine

## 2021-04-20 DIAGNOSIS — B1920 Unspecified viral hepatitis C without hepatic coma: Secondary | ICD-10-CM | POA: Diagnosis present

## 2021-04-20 DIAGNOSIS — I9589 Other hypotension: Secondary | ICD-10-CM | POA: Diagnosis not present

## 2021-04-20 DIAGNOSIS — K7460 Unspecified cirrhosis of liver: Secondary | ICD-10-CM | POA: Diagnosis not present

## 2021-04-20 DIAGNOSIS — G4733 Obstructive sleep apnea (adult) (pediatric): Secondary | ICD-10-CM | POA: Diagnosis present

## 2021-04-20 DIAGNOSIS — F32A Depression, unspecified: Secondary | ICD-10-CM | POA: Diagnosis present

## 2021-04-20 DIAGNOSIS — G9341 Metabolic encephalopathy: Secondary | ICD-10-CM | POA: Diagnosis not present

## 2021-04-20 DIAGNOSIS — E6609 Other obesity due to excess calories: Secondary | ICD-10-CM | POA: Diagnosis present

## 2021-04-20 DIAGNOSIS — I119 Hypertensive heart disease without heart failure: Secondary | ICD-10-CM | POA: Diagnosis present

## 2021-04-20 DIAGNOSIS — F10231 Alcohol dependence with withdrawal delirium: Secondary | ICD-10-CM | POA: Diagnosis present

## 2021-04-20 DIAGNOSIS — M791 Myalgia, unspecified site: Secondary | ICD-10-CM | POA: Diagnosis present

## 2021-04-20 DIAGNOSIS — Z7951 Long term (current) use of inhaled steroids: Secondary | ICD-10-CM | POA: Diagnosis not present

## 2021-04-20 DIAGNOSIS — F1210 Cannabis abuse, uncomplicated: Secondary | ICD-10-CM | POA: Diagnosis present

## 2021-04-20 DIAGNOSIS — F17210 Nicotine dependence, cigarettes, uncomplicated: Secondary | ICD-10-CM | POA: Diagnosis present

## 2021-04-20 DIAGNOSIS — Z8261 Family history of arthritis: Secondary | ICD-10-CM | POA: Diagnosis not present

## 2021-04-20 DIAGNOSIS — F10230 Alcohol dependence with withdrawal, uncomplicated: Secondary | ICD-10-CM | POA: Diagnosis not present

## 2021-04-20 DIAGNOSIS — J449 Chronic obstructive pulmonary disease, unspecified: Secondary | ICD-10-CM | POA: Diagnosis present

## 2021-04-20 DIAGNOSIS — E785 Hyperlipidemia, unspecified: Secondary | ICD-10-CM | POA: Diagnosis present

## 2021-04-20 DIAGNOSIS — Y906 Blood alcohol level of 120-199 mg/100 ml: Secondary | ICD-10-CM | POA: Diagnosis present

## 2021-04-20 DIAGNOSIS — Z888 Allergy status to other drugs, medicaments and biological substances status: Secondary | ICD-10-CM | POA: Diagnosis not present

## 2021-04-20 DIAGNOSIS — R578 Other shock: Secondary | ICD-10-CM | POA: Diagnosis not present

## 2021-04-20 DIAGNOSIS — Z20822 Contact with and (suspected) exposure to covid-19: Secondary | ICD-10-CM | POA: Diagnosis present

## 2021-04-20 DIAGNOSIS — F10239 Alcohol dependence with withdrawal, unspecified: Secondary | ICD-10-CM | POA: Diagnosis present

## 2021-04-20 DIAGNOSIS — K860 Alcohol-induced chronic pancreatitis: Secondary | ICD-10-CM | POA: Diagnosis present

## 2021-04-20 DIAGNOSIS — Z6835 Body mass index (BMI) 35.0-35.9, adult: Secondary | ICD-10-CM | POA: Diagnosis not present

## 2021-04-20 DIAGNOSIS — K429 Umbilical hernia without obstruction or gangrene: Secondary | ICD-10-CM | POA: Diagnosis present

## 2021-04-20 DIAGNOSIS — I8510 Secondary esophageal varices without bleeding: Secondary | ICD-10-CM | POA: Diagnosis present

## 2021-04-20 DIAGNOSIS — E871 Hypo-osmolality and hyponatremia: Secondary | ICD-10-CM | POA: Diagnosis present

## 2021-04-20 DIAGNOSIS — I959 Hypotension, unspecified: Secondary | ICD-10-CM | POA: Diagnosis present

## 2021-04-20 DIAGNOSIS — Z808 Family history of malignant neoplasm of other organs or systems: Secondary | ICD-10-CM | POA: Diagnosis not present

## 2021-04-20 LAB — CBC WITH DIFFERENTIAL/PLATELET
Abs Immature Granulocytes: 0.07 10*3/uL (ref 0.00–0.07)
Basophils Absolute: 0.1 10*3/uL (ref 0.0–0.1)
Basophils Relative: 1 %
Eosinophils Absolute: 0.1 10*3/uL (ref 0.0–0.5)
Eosinophils Relative: 1 %
HCT: 34.6 % — ABNORMAL LOW (ref 39.0–52.0)
Hemoglobin: 11.7 g/dL — ABNORMAL LOW (ref 13.0–17.0)
Immature Granulocytes: 1 %
Lymphocytes Relative: 23 %
Lymphs Abs: 2.5 10*3/uL (ref 0.7–4.0)
MCH: 31.8 pg (ref 26.0–34.0)
MCHC: 33.8 g/dL (ref 30.0–36.0)
MCV: 94 fL (ref 80.0–100.0)
Monocytes Absolute: 1.6 10*3/uL — ABNORMAL HIGH (ref 0.1–1.0)
Monocytes Relative: 14 %
Neutro Abs: 6.8 10*3/uL (ref 1.7–7.7)
Neutrophils Relative %: 60 %
Platelets: 172 10*3/uL (ref 150–400)
RBC: 3.68 MIL/uL — ABNORMAL LOW (ref 4.22–5.81)
RDW: 15.3 % (ref 11.5–15.5)
WBC: 11.2 10*3/uL — ABNORMAL HIGH (ref 4.0–10.5)
nRBC: 0 % (ref 0.0–0.2)

## 2021-04-20 LAB — URINALYSIS, ROUTINE W REFLEX MICROSCOPIC
Bilirubin Urine: NEGATIVE
Glucose, UA: NEGATIVE mg/dL
Hgb urine dipstick: NEGATIVE
Ketones, ur: NEGATIVE mg/dL
Leukocytes,Ua: NEGATIVE
Nitrite: NEGATIVE
Protein, ur: NEGATIVE mg/dL
Specific Gravity, Urine: 1.001 — ABNORMAL LOW (ref 1.005–1.030)
pH: 6 (ref 5.0–8.0)

## 2021-04-20 LAB — COMPREHENSIVE METABOLIC PANEL
ALT: 34 U/L (ref 0–44)
AST: 45 U/L — ABNORMAL HIGH (ref 15–41)
Albumin: 3.4 g/dL — ABNORMAL LOW (ref 3.5–5.0)
Alkaline Phosphatase: 65 U/L (ref 38–126)
Anion gap: 12 (ref 5–15)
BUN: 7 mg/dL (ref 6–20)
CO2: 18 mmol/L — ABNORMAL LOW (ref 22–32)
Calcium: 7.8 mg/dL — ABNORMAL LOW (ref 8.9–10.3)
Chloride: 97 mmol/L — ABNORMAL LOW (ref 98–111)
Creatinine, Ser: 0.9 mg/dL (ref 0.61–1.24)
GFR, Estimated: >60 mL/min (ref >60)
Glucose, Bld: 122 mg/dL — ABNORMAL HIGH (ref 70–99)
Potassium: 3.5 mmol/L (ref 3.5–5.1)
Sodium: 127 mmol/L — ABNORMAL LOW (ref 135–145)
Total Bilirubin: 0.7 mg/dL (ref 0.3–1.2)
Total Protein: 6.3 g/dL — ABNORMAL LOW (ref 6.5–8.1)

## 2021-04-20 LAB — ETHANOL: Alcohol, Ethyl (B): 184 mg/dL — ABNORMAL HIGH (ref <10)

## 2021-04-20 LAB — RESP PANEL BY RT-PCR (FLU A&B, COVID) ARPGX2
Influenza A by PCR: NEGATIVE
Influenza B by PCR: NEGATIVE
SARS Coronavirus 2 by RT PCR: NEGATIVE

## 2021-04-20 LAB — LIPASE, BLOOD: Lipase: 60 U/L — ABNORMAL HIGH (ref 11–51)

## 2021-04-20 LAB — MRSA PCR SCREENING: MRSA by PCR: NEGATIVE

## 2021-04-20 MED ORDER — LOSARTAN POTASSIUM 50 MG PO TABS
100.0000 mg | ORAL_TABLET | Freq: Every day | ORAL | Status: DC
  Administered 2021-04-20 – 2021-04-21 (×2): 100 mg via ORAL
  Filled 2021-04-20 (×3): qty 2

## 2021-04-20 MED ORDER — ADULT MULTIVITAMIN W/MINERALS CH
1.0000 | ORAL_TABLET | Freq: Every day | ORAL | Status: DC
  Administered 2021-04-20 – 2021-04-24 (×4): 1 via ORAL
  Filled 2021-04-20 (×4): qty 1

## 2021-04-20 MED ORDER — DIAZEPAM 2 MG PO TABS
5.0000 mg | ORAL_TABLET | Freq: Four times a day (QID) | ORAL | Status: DC
  Administered 2021-04-20 – 2021-04-21 (×5): 5 mg via ORAL
  Filled 2021-04-20 (×5): qty 3

## 2021-04-20 MED ORDER — LORAZEPAM 1 MG PO TABS
0.0000 mg | ORAL_TABLET | Freq: Four times a day (QID) | ORAL | Status: DC
  Administered 2021-04-20: 1 mg via ORAL
  Filled 2021-04-20: qty 1

## 2021-04-20 MED ORDER — ACETAMINOPHEN 650 MG RE SUPP: 650.0000 mg | Freq: Four times a day (QID) | RECTAL | Status: DC | PRN

## 2021-04-20 MED ORDER — THIAMINE HCL 100 MG/ML IJ SOLN: 100.0000 mg | Freq: Every day | INTRAMUSCULAR | Status: DC

## 2021-04-20 MED ORDER — FLUTICASONE FUROATE-VILANTEROL 200-25 MCG/INH IN AEPB
1.0000 | INHALATION_SPRAY | Freq: Every day | RESPIRATORY_TRACT | Status: DC
  Administered 2021-04-21 – 2021-04-24 (×3): 1 via RESPIRATORY_TRACT
  Filled 2021-04-20 (×2): qty 28

## 2021-04-20 MED ORDER — ONDANSETRON HCL 4 MG PO TABS
4.0000 mg | ORAL_TABLET | Freq: Four times a day (QID) | ORAL | Status: DC | PRN
  Administered 2021-04-20: 4 mg via ORAL
  Filled 2021-04-20: qty 1

## 2021-04-20 MED ORDER — ALBUTEROL SULFATE (2.5 MG/3ML) 0.083% IN NEBU
2.5000 mg | INHALATION_SOLUTION | Freq: Four times a day (QID) | RESPIRATORY_TRACT | Status: DC | PRN
  Administered 2021-04-21: 2.5 mg via RESPIRATORY_TRACT
  Filled 2021-04-20: qty 3

## 2021-04-20 MED ORDER — CARVEDILOL 6.25 MG PO TABS
6.2500 mg | ORAL_TABLET | Freq: Two times a day (BID) | ORAL | Status: DC
  Administered 2021-04-20 – 2021-04-21 (×4): 6.25 mg via ORAL
  Filled 2021-04-20 (×4): qty 1

## 2021-04-20 MED ORDER — PANTOPRAZOLE SODIUM 40 MG PO TBEC
40.0000 mg | DELAYED_RELEASE_TABLET | Freq: Every day | ORAL | Status: DC
  Administered 2021-04-20 – 2021-04-24 (×4): 40 mg via ORAL
  Filled 2021-04-20 (×4): qty 1

## 2021-04-20 MED ORDER — LORATADINE 10 MG PO TABS
10.0000 mg | ORAL_TABLET | Freq: Every day | ORAL | Status: DC
  Administered 2021-04-20 – 2021-04-24 (×4): 10 mg via ORAL
  Filled 2021-04-20 (×4): qty 1

## 2021-04-20 MED ORDER — LORAZEPAM 2 MG/ML IJ SOLN
1.0000 mg | INTRAMUSCULAR | Status: DC | PRN
Start: 2021-04-20 — End: 2021-04-21
  Administered 2021-04-21 (×3): 3 mg via INTRAVENOUS
  Administered 2021-04-21: 2 mg via INTRAVENOUS
  Filled 2021-04-20 (×2): qty 1
  Filled 2021-04-20 (×2): qty 2

## 2021-04-20 MED ORDER — HYDROCODONE-ACETAMINOPHEN 5-325 MG PO TABS: 1.0000 | ORAL_TABLET | ORAL | Status: DC | PRN

## 2021-04-20 MED ORDER — MONTELUKAST SODIUM 10 MG PO TABS
10.0000 mg | ORAL_TABLET | Freq: Every day | ORAL | Status: DC
  Administered 2021-04-20 – 2021-04-23 (×4): 10 mg via ORAL
  Filled 2021-04-20 (×4): qty 1

## 2021-04-20 MED ORDER — ONDANSETRON HCL 4 MG/2ML IJ SOLN: 4.0000 mg | Freq: Four times a day (QID) | INTRAMUSCULAR | Status: DC | PRN

## 2021-04-20 MED ORDER — LACTATED RINGERS IV SOLN: INTRAVENOUS | Status: DC

## 2021-04-20 MED ORDER — LORAZEPAM 2 MG/ML IJ SOLN
0.0000 mg | INTRAMUSCULAR | Status: DC
  Administered 2021-04-20 (×3): 1 mg via INTRAVENOUS
  Administered 2021-04-20: 2 mg via INTRAVENOUS
  Administered 2021-04-21: 3 mg via INTRAVENOUS
  Administered 2021-04-21 (×2): 2 mg via INTRAVENOUS
  Administered 2021-04-21 (×2): 3 mg via INTRAVENOUS
  Administered 2021-04-21: 2 mg via INTRAVENOUS
  Filled 2021-04-20 (×3): qty 1
  Filled 2021-04-20 (×2): qty 2
  Filled 2021-04-20: qty 1
  Filled 2021-04-20: qty 2
  Filled 2021-04-20 (×3): qty 1
  Filled 2021-04-20: qty 2

## 2021-04-20 MED ORDER — THIAMINE HCL 100 MG PO TABS
100.0000 mg | ORAL_TABLET | Freq: Every day | ORAL | Status: DC
  Administered 2021-04-20 – 2021-04-21 (×2): 100 mg via ORAL
  Filled 2021-04-20 (×2): qty 1

## 2021-04-20 MED ORDER — HYDRALAZINE HCL 20 MG/ML IJ SOLN: 5.0000 mg | INTRAMUSCULAR | Status: DC | PRN

## 2021-04-20 MED ORDER — TAMSULOSIN HCL 0.4 MG PO CAPS
0.4000 mg | ORAL_CAPSULE | Freq: Every day | ORAL | Status: DC
  Administered 2021-04-20 – 2021-04-24 (×4): 0.4 mg via ORAL
  Filled 2021-04-20 (×4): qty 1

## 2021-04-20 MED ORDER — ENSURE ENLIVE PO LIQD
237.0000 mL | Freq: Two times a day (BID) | ORAL | Status: DC
  Administered 2021-04-21 – 2021-04-24 (×7): 237 mL via ORAL

## 2021-04-20 MED ORDER — POLYETHYLENE GLYCOL 3350 17 G PO PACK: 17.0000 g | PACK | Freq: Every day | ORAL | Status: DC | PRN

## 2021-04-20 MED ORDER — LORAZEPAM 2 MG/ML IJ SOLN: 0.0000 mg | Freq: Three times a day (TID) | INTRAMUSCULAR | Status: DC

## 2021-04-20 MED ORDER — LORAZEPAM 1 MG PO TABS
1.0000 mg | ORAL_TABLET | ORAL | Status: DC | PRN
  Administered 2021-04-21: 3 mg via ORAL
  Filled 2021-04-20: qty 3

## 2021-04-20 MED ORDER — LORAZEPAM 1 MG PO TABS: 0.0000 mg | ORAL_TABLET | Freq: Two times a day (BID) | ORAL | Status: DC

## 2021-04-20 MED ORDER — SODIUM CHLORIDE 0.9% FLUSH
3.0000 mL | Freq: Two times a day (BID) | INTRAVENOUS | Status: DC
  Administered 2021-04-20 – 2021-04-24 (×7): 3 mL via INTRAVENOUS

## 2021-04-20 MED ORDER — LORAZEPAM 2 MG/ML IJ SOLN: 0.0000 mg | Freq: Two times a day (BID) | INTRAMUSCULAR | Status: DC

## 2021-04-20 MED ORDER — ENOXAPARIN SODIUM 40 MG/0.4ML IJ SOSY
40.0000 mg | PREFILLED_SYRINGE | INTRAMUSCULAR | Status: DC
  Administered 2021-04-20 – 2021-04-24 (×5): 40 mg via SUBCUTANEOUS
  Filled 2021-04-20 (×5): qty 0.4

## 2021-04-20 MED ORDER — ACETAMINOPHEN 325 MG PO TABS
650.0000 mg | ORAL_TABLET | Freq: Four times a day (QID) | ORAL | Status: DC | PRN
  Administered 2021-04-20 – 2021-04-21 (×3): 650 mg via ORAL
  Filled 2021-04-20 (×3): qty 2

## 2021-04-20 MED ORDER — AZELASTINE HCL 0.1 % NA SOLN
1.0000 | Freq: Every day | NASAL | Status: DC
  Administered 2021-04-21 – 2021-04-24 (×3): 1 via NASAL
  Filled 2021-04-20 (×2): qty 30

## 2021-04-20 MED ORDER — NICOTINE 14 MG/24HR TD PT24
14.0000 mg | MEDICATED_PATCH | Freq: Every day | TRANSDERMAL | Status: DC
  Administered 2021-04-20 – 2021-04-24 (×5): 14 mg via TRANSDERMAL
  Filled 2021-04-20 (×5): qty 1

## 2021-04-20 MED ORDER — FOLIC ACID 1 MG PO TABS
1.0000 mg | ORAL_TABLET | Freq: Every day | ORAL | Status: DC
  Administered 2021-04-20 – 2021-04-21 (×2): 1 mg via ORAL
  Filled 2021-04-20 (×2): qty 1

## 2021-04-20 MED ORDER — LORAZEPAM 2 MG/ML IJ SOLN: 0.0000 mg | Freq: Four times a day (QID) | INTRAMUSCULAR | Status: DC

## 2021-04-20 MED ORDER — SODIUM CHLORIDE 0.9 % IV BOLUS
1000.0000 mL | Freq: Once | INTRAVENOUS | Status: AC
Start: 2021-04-20 — End: 2021-04-20
  Administered 2021-04-20: 1000 mL via INTRAVENOUS

## 2021-04-20 MED ORDER — VENLAFAXINE HCL ER 75 MG PO CP24
75.0000 mg | ORAL_CAPSULE | Freq: Every day | ORAL | Status: DC
  Administered 2021-04-20 – 2021-04-24 (×4): 75 mg via ORAL
  Filled 2021-04-20 (×4): qty 1

## 2021-04-20 MED ORDER — BISACODYL 5 MG PO TBEC: 5.0000 mg | DELAYED_RELEASE_TABLET | Freq: Every day | ORAL | Status: DC | PRN

## 2021-04-20 MED ORDER — MORPHINE SULFATE (PF) 2 MG/ML IV SOLN: 2.0000 mg | INTRAVENOUS | Status: DC | PRN

## 2021-04-20 MED ORDER — DOCUSATE SODIUM 100 MG PO CAPS
100.0000 mg | ORAL_CAPSULE | Freq: Two times a day (BID) | ORAL | Status: DC
  Administered 2021-04-20 – 2021-04-24 (×6): 100 mg via ORAL
  Filled 2021-04-20 (×6): qty 1

## 2021-04-20 MED ORDER — THIAMINE HCL 100 MG PO TABS
100.0000 mg | ORAL_TABLET | Freq: Every day | ORAL | Status: DC
Start: 2021-04-20 — End: 2021-04-20

## 2021-04-20 NOTE — Progress Notes (Signed)
Initial Nutrition Assessment  DOCUMENTATION CODES:   Not applicable  INTERVENTION:   -Ensure Enlive po BID, each supplement provides 350 kcal and 20 grams of protein  -Continue MVI with minerals daily  NUTRITION DIAGNOSIS:   Increased nutrient needs related to chronic illness (cirrhosis) as evidenced by estimated needs.  GOAL:   Patient will meet greater than or equal to 90% of their needs  MONITOR:   PO intake, Supplement acceptance, Labs, Weight trends, Skin, I & O's  REASON FOR ASSESSMENT:   Consult Assessment of nutrition requirement/status  ASSESSMENT:   Levi Alvarez is a 50 y.o. male with medical history significant of ETOH dependence; alcoholic cirrhosis with varices; COPD; HTN; and OSA presenting with weakness.  He reports that his BP bottomed out.  This happened last week too.  He is an alcoholic and drank too much for a few days and when he went to dry out this happened.  His BP dropped "to 50/40 or something crazy like that" and he was unable to see - everything turned a fuzzy white "like the old tv."  He drinks 20-24 beers per day.  He last drank last night.  He tried to cut back last week when it happened and then went up again and then tried to quit again.  Both times he got hypotensive was when he was trying to cut back.  He has decided to try to quit altogether.  He has had withdrawal symptoms in the past, without requiring hospitalization.  He has had tremors but denies seizures.  Denies hallucinations but "his eyes played tricks" on him.  Pt admitted with alcohol dependence/ withdrawal with hypotensive episodes and cirrhosis.   Reviewed I/O's: +1.4 L x 24 hours  UOP: 700 ml x 24 hours  Pt lethargic at time of visit. He did not respond to voice or touch.   Pt currently on a heart healthy diet; no meal completion data available to assess at this time.   Reviewed wt hx; wt has been stable over the past 2 years.   Pt with increased nutritional needs and  would benefit from addition of oral nutrition supplements.   Medications reviewed and include colace, folvite, ativan, thiamine, and lactated ringers infusion @ 75 ml/hr.   Labs reviewed.   NUTRITION - FOCUSED PHYSICAL EXAM:  Flowsheet Row Most Recent Value  Orbital Region No depletion  Upper Arm Region No depletion  Thoracic and Lumbar Region No depletion  Buccal Region No depletion  Temple Region No depletion  Clavicle Bone Region No depletion  Clavicle and Acromion Bone Region No depletion  Scapular Bone Region No depletion  Dorsal Hand No depletion  Patellar Region No depletion  Anterior Thigh Region No depletion  Posterior Calf Region No depletion  Edema (RD Assessment) Mild  Hair Reviewed  Eyes Reviewed  Mouth Reviewed  Skin Reviewed  Nails Reviewed       Diet Order:   Diet Order             Diet 2 gram sodium Room service appropriate? Yes; Fluid consistency: Thin  Diet effective now                   EDUCATION NEEDS:   No education needs have been identified at this time  Skin:  Skin Assessment: Reviewed RN Assessment  Last BM:  04/19/21  Height:   Ht Readings from Last 1 Encounters:  04/19/21 5\' 9"  (1.753 m)    Weight:   Wt Readings from  Last 1 Encounters:  04/19/21 108.9 kg    Ideal Body Weight:  72.7 kg  BMI:  Body mass index is 35.44 kg/m.  Estimated Nutritional Needs:   Kcal:  2100-2300  Protein:  110-125 grams  Fluid:  > 2 L    Loistine Chance, RD, LDN, [place] Registered Dietitian II Certified Diabetes Care and Education Specialist Please refer to [facility] for RD and/or RD on-call/weekend/after hours pager

## 2021-04-20 NOTE — H&P (Signed)
History and Physical    Levi Alvarez KPT:[id number] DOB: 15-Aug-1971 DOA: 04/19/2021  PCP: Sonia Side., FNP Consultants:  [facility] - GI; [place] - neurosurgery Patient coming from:  Home - lives with girlfriend and her daughter; Levi Alvarez: Girlfriend, [phone]  Chief Complaint: weakness  HPI: Levi Alvarez is a 50 y.o. male with medical history significant of ETOH dependence; alcoholic cirrhosis with varices; COPD; HTN; and OSA presenting with weakness.  He reports that his BP bottomed out.  This happened last week too.  He is an alcoholic and drank too much for a few days and when he went to dry out this happened.  His BP dropped "to 50/40 or something crazy like that" and he was unable to see - everything turned a fuzzy white "like the old tv."  He drinks 20-24 beers per day.  He last drank last night.  He tried to cut back last week when it happened and then went up again and then tried to quit again.  Both times he got hypotensive was when he was trying to cut back.  He has decided to try to quit altogether.  He has had withdrawal symptoms in the past, without requiring hospitalization.  He has had tremors but denies seizures.  Denies hallucinations but "his eyes played tricks" on him.      ED Course: [place], per Dr. Tonie Griffith:  50 yo male who was admitted within last 2 weeks for hypotension. Presented with hypotension with SBP 60 by EMS. Given 4 liters IVF in ER. Alcoholic and developing withdrawal symptoms. On CIWA.   Review of Systems: As per HPI; otherwise review of systems reviewed and negative.   Ambulatory Status:  Ambulates without assistance  COVID Vaccine Status:  None  Past Medical History:  Diagnosis Date   Alcoholism ([place])    Blood transfusion without reported diagnosis    jan, 2020 3 units PRBC   Cirrhosis ([place])    secondary to alcohol and hep C   Constipation    Resolved   COPD (chronic obstructive pulmonary disease) (HCC)    DDD (degenerative disc  disease), cervical    ED (erectile dysfunction)    Esophageal varices (HCC)    GERD (gastroesophageal reflux disease)    H/O: upper GI bleed    Hepatitis C antibody positive in blood    resolved   High blood pressure    History of anemia    Left inguinal hernia 2020   Lung nodule    Small   Sleep apnea    trying to get used to his CPAP, not wearing regularly   Umbilical hernia 9675    Past Surgical History:  Procedure Laterality Date   arm surgery     left arm,   COLONOSCOPY WITH PROPOFOL N/A 01/19/2019   Procedure: COLONOSCOPY WITH PROPOFOL;  Surgeon: Yetta Flock, MD;  Location: [facility];  Service: Gastroenterology;  Laterality: N/A;   ESOPHAGEAL BANDING  11/25/2018   Procedure: ESOPHAGEAL BANDING;  Surgeon: Milus Banister, MD;  Location: [facility];  Service: Endoscopy;;   ESOPHAGEAL BANDING  01/19/2019   Procedure: ESOPHAGEAL BANDING;  Surgeon: Yetta Flock, MD;  Location: [facility];  Service: Gastroenterology;;   ESOPHAGEAL BANDING N/A 01/24/2021   Procedure: ESOPHAGEAL BANDING;  Surgeon: Yetta Flock, MD;  Location: [facility];  Service: Gastroenterology;  Laterality: N/A;   ESOPHAGOGASTRODUODENOSCOPY (EGD) WITH PROPOFOL N/A 11/25/2018   Procedure: ESOPHAGOGASTRODUODENOSCOPY (EGD) WITH PROPOFOL;  Surgeon: Milus Banister, MD;  Location: [facility];  Service: Endoscopy;  Laterality: N/A;   ESOPHAGOGASTRODUODENOSCOPY (EGD) WITH PROPOFOL N/A 01/19/2019   Procedure: ESOPHAGOGASTRODUODENOSCOPY (EGD) WITH PROPOFOL;  Surgeon: Yetta Flock, MD;  Location: [facility];  Service: Gastroenterology;  Laterality: N/A;   ESOPHAGOGASTRODUODENOSCOPY (EGD) WITH PROPOFOL N/A 04/28/2019   Procedure: ESOPHAGOGASTRODUODENOSCOPY (EGD) WITH PROPOFOL;  Surgeon: Yetta Flock, MD;  [facility]: [facility];  Service: Gastroenterology;  Laterality: N/A;   ESOPHAGOGASTRODUODENOSCOPY (EGD) WITH PROPOFOL N/A 11/03/2019   Procedure: ESOPHAGOGASTRODUODENOSCOPY  (EGD) WITH PROPOFOL;  Surgeon: Doran Stabler, MD;  Location: [facility];  Service: Gastroenterology;  Laterality: N/A;   ESOPHAGOGASTRODUODENOSCOPY (EGD) WITH PROPOFOL N/A 01/24/2021   Procedure: ESOPHAGOGASTRODUODENOSCOPY (EGD) WITH PROPOFOL;  Surgeon: Yetta Flock, MD;  Location: [facility];  Service: Gastroenterology;  Laterality: N/A;   KNEE SURGERY Left    POLYPECTOMY  01/19/2019   Procedure: POLYPECTOMY;  Surgeon: Yetta Flock, MD;  Location: [facility];  Service: Gastroenterology;;    Social History   Socioeconomic History   Marital status: Divorced    Spouse name: Not on file   Number of children: Not on file   Years of education: Not on file   Highest education level: Not on file  Occupational History   Occupation: disabled  Tobacco Use   Smoking status: Every Day    Packs/day: 0.50    Years: 30.00    Pack years: 15.00    Types: Cigarettes   Smokeless tobacco: Former    Types: Nurse, children's Use: Never used  Substance and Sexual Activity   Alcohol use: Yes    Comment: 20-24 beer per day   Drug use: Yes    Types: Marijuana    Comment: daily use   Sexual activity: Yes    Partners: Female  Other Topics Concern   Not on file  Social History Narrative   Not on file   Social Determinants of Health   Financial Resource Strain: Not on file  Food Insecurity: Not on file  Transportation Needs: Not on file  Physical Activity: Not on file  Stress: Not on file  Social Connections: Not on file  Intimate Partner Violence: Not on file    Allergies  Allergen Reactions   Gabapentin     Other reaction(s): restless leg   Naproxen Hives and Swelling    Other reaction(s): hives    Family History  Problem Relation Age of Onset   Colitis Mother    Arthritis Mother    Cancer Father        lymphoma?   Cancer Paternal Grandfather        bone   Heart disease Neg Hx    Stroke Neg Hx    Diabetes Neg Hx     Prior to Admission  medications   Medication Sig Start Date End Date Taking? Authorizing Provider  acetaminophen (TYLENOL) 500 MG tablet Take 1,000 mg by mouth every 6 (six) hours as needed (pain.).   Yes [provider]  albuterol (PROVENTIL) (2.5 MG/3ML) 0.083% nebulizer solution Take 3 mLs (2.5 mg total) by nebulization every 6 (six) hours as needed for wheezing or shortness of breath. 12/02/18  Yes Gildardo Pounds, NP  azelastine (ASTELIN) 0.1 % nasal spray Place 1 spray into both nostrils daily. 03/21/21  Yes [provider]  carvedilol (COREG) 6.25 MG tablet Take 6.25 mg by mouth 2 (two) times daily. 02/01/21  Yes [provider]  fluticasone furoate-vilanterol (BREO ELLIPTA) 200-25 MCG/INH AEPB Inhale 1 puff  into the lungs daily. 09/25/19  Yes Gildardo Pounds, NP  loratadine (CLARITIN) 10 MG tablet Take 10 mg by mouth daily.   Yes [provider]  losartan (COZAAR) 100 MG tablet Take 100 mg by mouth daily. 12/24/20  Yes [provider]  meloxicam (MOBIC) 15 MG tablet Take 15 mg by mouth daily as needed for pain. 04/13/21  Yes [provider]  Milk Thistle 1000 MG CAPS Take 1,000 mg by mouth 2 (two) times daily.   Yes [provider]  montelukast (SINGULAIR) 10 MG tablet TAKE 1 TABLET (10 MG) BY ORAL ROUTE ONCE DAILY IN THE EVENING FOR ALLERGIC RHINITIS FOR 90 DAYS Patient taking differently: Take 10 mg by mouth at bedtime. 09/15/20 09/15/21 Yes Sonia Side., FNP  Multiple Vitamin (MULTIVITAMIN WITH MINERALS) TABS tablet Take 1 tablet by mouth daily.   Yes [provider]  pantoprazole (PROTONIX) 40 MG tablet TAKE 1 TABLET BY MOUTH 2 (TWO) TIMES DAILY BEFORE A MEAL FOR 30 DAYS. Patient taking differently: Take 40 mg by mouth daily. 01/07/20  Yes Gildardo Pounds, NP  sildenafil (VIAGRA) 50 MG tablet Take 50 mg by mouth daily as needed for erectile dysfunction.   Yes [provider]  tamsulosin (FLOMAX) 0.4 MG CAPS capsule TAKE 1  CAPSULE (0.4 MG TOTAL) BY MOUTH DAILY. Patient taking differently: Take 0.4 mg by mouth daily. 08/17/20 08/17/21 Yes Gildardo Pounds, NP  venlafaxine XR (EFFEXOR-XR) 75 MG 24 hr capsule TAKE 1 CAPSULE BY MOUTH DAILY. 08/15/20 08/15/21 Yes Deveron Furlong, NP  VENTOLIN HFA 108 (90 Base) MCG/ACT inhaler INHALE 1 PUFF EVERY 6 (SIX) HOURS AS NEEDED INTO THE LUNGS FOR WHEEZING OR SHORTNESS OF BREATH. Patient taking differently: Inhale 2 puffs into the lungs every 6 (six) hours as needed for wheezing or shortness of breath. 05/19/20  Yes Charlott Rakes, MD  buPROPion (WELLBUTRIN SR) 150 MG 12 hr tablet Take 1 tablet (150 mg total) by mouth 2 (two) times daily. Patient not taking: No sig reported 03/28/20 04/27/20  Gildardo Pounds, NP  spironolactone (ALDACTONE) 100 MG tablet Take 1 tablet (100 mg total) by mouth daily. Patient not taking: No sig reported 04/09/20 07/08/20  Gildardo Pounds, NP  venlafaxine XR (EFFEXOR-XR) 37.5 MG 24 hr capsule TAKE ONE CAPSULE BY MOUTH ONCE A DAY (IN ADDITION TO 75MG  FOR DAILY TOTAL OF 112.5MG ) Patient not taking: No sig reported 08/15/20 08/15/21  Deveron Furlong, NP    Physical Exam: Vitals:   04/20/21 0813 04/20/21 0830 04/20/21 0914 04/20/21 1055  BP: 112/76 118/70 137/65 (!) 161/76  Pulse: 79 77 74 71  Resp: 14 14 16 16   Temp: 98.2 F (36.8 C)  99.3 F (37.4 C) 98.4 F (36.9 C)  TempSrc: Oral  Oral Oral  SpO2: 99% 96% 97% 97%  Weight:      Height:         General:  Appears calm and comfortable and is in NAD, mildly anxious/tremulous, diaphoretic Eyes:  PERRL, EOMI, normal lids, iris ENT:  grossly normal hearing, lips & tongue, mmm; some missing dentition Neck:  no LAD, masses or thyromegaly Cardiovascular:  RRR, no m/r/g. No LE edema.  Respiratory:   CTA bilaterally with no wheezes/rales/rhonchi.  Normal respiratory effort. Abdomen:  soft, NT, ND Skin:  no rash or induration seen on limited exam Musculoskeletal:  grossly normal tone BUE/BLE, good ROM, no  bony abnormality Lower extremity:  No LE edema.  Limited foot exam with no ulcerations.  2+  distal pulses. Psychiatric:  mildly anxious and blunted mood and affect, speech fluent and appropriate, AOx3 Neurologic:  CN 2-12 grossly intact, moves all extremities in coordinated fashion    Radiological Exams on Admission: Independently reviewed - see discussion in A/P where applicable  No results found.  EKG: Independently reviewed.  NSR with rate 72; no evidence of acute ischemia   Labs on Admission: I have personally reviewed the available labs and imaging studies at the time of the admission.  Pertinent labs:   ABG: 7.348/36.7/70/94% Na++ 127 CO2 18 Glucose 122 Lipase 60 WBC 11.2 Hgb 11.7 - stable COVID/flu negative UA unremarkable ETOH 184   Assessment/Plan Principal Problem:   Alcohol dependence with withdrawal with complication (HCC) Active Problems:   Essential hypertension   OSA (obstructive sleep apnea)   Class 2 obesity due to excess calories with body mass index (BMI) of 35.0 to 35.9 in adult   Smoker   Hyperlipidemia   Cirrhosis (HCC)   Chronic obstructive pulmonary disease (HCC)   Hypotension   Marijuana abuse, continuous   Alcohol dependence/withdrawal with hypotensive episodes -Patient with chronic ETOH dependence -Number of drinks per day: 20-24 beers -Number of admissions for management of alcohol withdrawal syndrome (detox): 0 -Patient is exhibiting active s/sx of withdrawal, CIWA score is not yet recorded -BAL on admission: 184 -He is at high risk for complications of withdrawal including seizures, DTs; he is at high risk for needing Precedex to help get through DTs -Will admit to progressive care at this time -CIWA protocol -Folate, thiamine, and MVI ordered -Will provide fixed-dose (Valium 5 mg PO q6h) and also symptom-triggered BZD (Ativan per CIWA protocol) given his high BAL level; and concern for possible severe withdrawal. -TOC team consult  for possible inpatient treatment -Will also check UDS. -Consider offering a medication for Alcohol Use Disorder at the time of d/c, to include Disulfuram; Naltrexone; or Acamprosate. -Reported hypotensive episodes at home both happened when patient was attempting to taper his own use of alcohol and are likely related to withdrawal. -Continue Effexor  Cirrhosis -Patient with known h/o varices, s/p band ligation in 2020 -Trace varices were noted on 01/24/21 EGD, will need repeat in 1 year -Once cirrhosis is decompensated, average survival is 1.6 years -MELD score of 15 is the threshold for a patient survival with transplantation > survival without transplantation; patients should be considered for transplant referral in this circumstance. -MELD/MELD-Na score is 19, with a mortality rate of 3-4% -Child-Pugh category is A, with a 100% 1-year survival -Patient needs to completely stop drinking alcohol and can then be referred for consideration of transplant if needed -Recommend 2 gram sodium restricted diet with nutritional education; consult requested -Continue Protonix  HTN -Continue Coreg, Cozaar  COPD with ongoing tobacco use -Continue Albuterol, Breo, Claritin, Singulair -Tobacco Dependence: encourage cessation.   -This was discussed with the patient and should be reviewed on an ongoing basis.   -Patch ordered at patient request.   Marijuana abuse -Reports daily use -Cessation encouraged; this should be encouraged on an ongoing basis -UDS ordered   OSA -Reports inconsistent use with CPAP - getting used to it, struggling with his significant beard in creating a mask fit  Obesity -Body mass index is 35.44 kg/m..  -Weight loss should be encouraged -Outpatient PCP/bariatric medicine f/u encouraged     Note: This patient has been tested and is negative for the novel coronavirus COVID-19. The patient has NOT been vaccinated against COVID-19.   Level of care: Progressive DVT  prophylaxis:  Lovenox Code Status:  Full - confirmed with patient Family Communication: None present Disposition Plan:  The patient is from: home  Anticipated d/c is to: home without [facility] services   Anticipated d/c date will depend on clinical response to treatment, likely 2-4 days once serious risk of DTs has subsided  Patient is currently: acutely ill Consults called: TOC team, nutrition  Admission status:  Admit - It is my clinical opinion that admission to [place] is reasonable and necessary because of the expectation that this patient will require hospital care that crosses at least 2 midnights to treat this condition based on the medical complexity of the problems presented.  Given the aforementioned information, the predictability of an adverse outcome is felt to be significant.    Karmen Bongo MD [facility]   How to contact the [facility] Attending or Consulting provider [place] or covering provider during after hours [place], for this patient?  Check the care team in [facility] and look for a) attending/consulting [facility] provider listed and b) the [facility] team listed Log into [email or web address] and use [place]'s universal password to access. If you do not have the password, please contact the hospital operator. Locate the [facility] provider you are looking for under [facility] and page to a number that you can be directly reached. If you still have difficulty reaching the provider, please page the [facility] (Director on Call) for the Hospitalists listed on amion for assistance.   04/20/2021, 11:16 AM

## 2021-04-20 NOTE — ED Notes (Signed)
Per pt he wants to stop drinking and his last alcoholic beverage was on 02/16/41 around 2000

## 2021-04-20 NOTE — Progress Notes (Signed)
Patient was admitted to [phone]. A&O x4. CIWA = 6. Will give scheduled 1100 Ativan 1mg . Skin intact. Patient is complaining of mild nausea, PO Zofran given. Telemetry initiated. Belongings include jewelry, cell phone, clothing. Patient was instructed to how to use call bell and safety precautions.

## 2021-04-20 NOTE — ED Notes (Signed)
Pt requesting to speak with provider. Provider at bedside

## 2021-04-21 DIAGNOSIS — E861 Hypovolemia: Secondary | ICD-10-CM

## 2021-04-21 DIAGNOSIS — I9589 Other hypotension: Secondary | ICD-10-CM

## 2021-04-21 DIAGNOSIS — K7460 Unspecified cirrhosis of liver: Secondary | ICD-10-CM

## 2021-04-21 DIAGNOSIS — F10239 Alcohol dependence with withdrawal, unspecified: Secondary | ICD-10-CM

## 2021-04-21 DIAGNOSIS — E871 Hypo-osmolality and hyponatremia: Secondary | ICD-10-CM

## 2021-04-21 LAB — BASIC METABOLIC PANEL
Anion gap: 8 (ref 5–15)
BUN: 7 mg/dL (ref 6–20)
CO2: 27 mmol/L (ref 22–32)
Calcium: 9.4 mg/dL (ref 8.9–10.3)
Chloride: 100 mmol/L (ref 98–111)
Creatinine, Ser: 0.84 mg/dL (ref 0.61–1.24)
GFR, Estimated: >60 mL/min (ref >60)
Glucose, Bld: 100 mg/dL — ABNORMAL HIGH (ref 70–99)
Potassium: 3.7 mmol/L (ref 3.5–5.1)
Sodium: 135 mmol/L (ref 135–145)

## 2021-04-21 LAB — CBC
HCT: 37.2 % — ABNORMAL LOW (ref 39.0–52.0)
Hemoglobin: 12.3 g/dL — ABNORMAL LOW (ref 13.0–17.0)
MCH: 30.9 pg (ref 26.0–34.0)
MCHC: 33.1 g/dL (ref 30.0–36.0)
MCV: 93.5 fL (ref 80.0–100.0)
Platelets: 133 10*3/uL — ABNORMAL LOW (ref 150–400)
RBC: 3.98 MIL/uL — ABNORMAL LOW (ref 4.22–5.81)
RDW: 15.3 % (ref 11.5–15.5)
WBC: 8 10*3/uL (ref 4.0–10.5)
nRBC: 0 % (ref 0.0–0.2)

## 2021-04-21 LAB — AMMONIA: Ammonia: 48 umol/L — ABNORMAL HIGH (ref 9–35)

## 2021-04-21 MED ORDER — CHLORDIAZEPOXIDE HCL 25 MG PO CAPS
25.0000 mg | ORAL_CAPSULE | Freq: Once | ORAL | Status: AC
  Administered 2021-04-21: 25 mg via ORAL
  Filled 2021-04-21: qty 1

## 2021-04-21 MED ORDER — DEXMEDETOMIDINE HCL IN NACL 400 MCG/100ML IV SOLN
0.4000 ug/kg/h | INTRAVENOUS | Status: DC
  Administered 2021-04-21: 1.2 ug/kg/h via INTRAVENOUS
  Administered 2021-04-22: 1 ug/kg/h via INTRAVENOUS
  Filled 2021-04-21 (×3): qty 100

## 2021-04-21 MED ORDER — PHENOBARBITAL SODIUM 130 MG/ML IJ SOLN
130.0000 mg | INTRAMUSCULAR | Status: DC | PRN
  Administered 2021-04-22: 130 mg via INTRAVENOUS
  Filled 2021-04-21: qty 1

## 2021-04-21 MED ORDER — CHLORDIAZEPOXIDE HCL 5 MG PO CAPS
20.0000 mg | ORAL_CAPSULE | Freq: Three times a day (TID) | ORAL | Status: DC
  Administered 2021-04-21: 20 mg via ORAL
  Filled 2021-04-21: qty 4

## 2021-04-21 MED ORDER — PHENOBARBITAL SODIUM 130 MG/ML IJ SOLN: 500.0000 mg | Freq: Once | INTRAMUSCULAR | Status: DC

## 2021-04-21 MED ORDER — DIAZEPAM 5 MG/ML IJ SOLN
5.0000 mg | Freq: Once | INTRAMUSCULAR | Status: AC
  Administered 2021-04-21: 5 mg via INTRAVENOUS
  Filled 2021-04-21: qty 2

## 2021-04-21 MED ORDER — PHENOBARBITAL SODIUM 130 MG/ML IJ SOLN: 260.0000 mg | Freq: Once | INTRAMUSCULAR | Status: DC | PRN

## 2021-04-21 MED ORDER — CHLORDIAZEPOXIDE HCL 25 MG PO CAPS
25.0000 mg | ORAL_CAPSULE | Freq: Four times a day (QID) | ORAL | Status: DC
  Administered 2021-04-21: 25 mg via ORAL
  Filled 2021-04-21: qty 1

## 2021-04-21 MED ORDER — SODIUM CHLORIDE 0.9 % IV SOLN
500.0000 mg | Freq: Once | INTRAVENOUS | Status: AC
  Administered 2021-04-22: 500 mg via INTRAVENOUS
  Filled 2021-04-21 (×2): qty 3.85

## 2021-04-21 MED ORDER — SODIUM CHLORIDE 0.9 % IV SOLN: 260.0000 mg | Freq: Once | INTRAVENOUS | Status: DC | PRN

## 2021-04-21 NOTE — Progress Notes (Signed)
Relative at bedside told the RN that patient has sleep apnea and wears CPAP at home. Will inform the RT

## 2021-04-21 NOTE — Progress Notes (Signed)
Patient's R AC becoming red from him pulling out his 18G IV a few hours ago. Per patient, it does not hurt. Noted a small hard area under the skin right above the insertion site. IV was flushing and giving blood return with no pain or redness prior to this removal.

## 2021-04-21 NOTE — Plan of Care (Signed)
  Problem: Clinical Measurements: Goal: Respiratory complications will improve Outcome: Progressing   Problem: Clinical Measurements: Goal: Cardiovascular complication will be avoided Outcome: Progressing   Problem: Pain Managment: Goal: General experience of comfort will improve Outcome: Progressing   Problem: Safety: Goal: Ability to remain free from injury will improve Outcome: Progressing   Problem: Safety: Goal: Non-violent Restraint(s) Outcome: Progressing

## 2021-04-21 NOTE — Progress Notes (Signed)
Have continuously been monitoring the patient and giving scheduled meds in addition to PRN Ativan every hour. Still awaiting IV access from IV team. PO PRN ativan given as well as PO [name]. Patient's significant other visibly upset because the patient was not accepted to the ICU. I have attempted to calm the patient's girlfriend and reassure her.  Patient is in bed with soft bilateral wrist restraints, sweat beads on his forehead, is agitated and restless. Patient is asking for scissors or pocket knives so he can get out of his restraints. Will continue to monitor patient and treat symptoms as soon as I am able.

## 2021-04-21 NOTE — Progress Notes (Signed)
Patient becoming increasingly anxious/agitated over the past few hours. Scheduled and PRN ativan given. Patient currently scoring between 14-19 CIWA. Have notified Dr.Ghimire. Girlfriend Levi Alvarez) at bedside

## 2021-04-21 NOTE — Progress Notes (Addendum)
PROGRESS NOTE        PATIENT DETAILS Name: Levi Alvarez Age: 50 y.o. Sex: male Date of Birth: 1971-05-22 Admit Date: 04/19/2021 Admitting Physician Eben Burow, MD XNA:TFTDD, Malva Limes., FNP  Brief Narrative: Patient is a 50 y.o. male with history of alcoholic liver cirrhosis, HTN, ongoing alcohol abuse-presented with alcohol withdrawal symptoms and hypotension.  See below for further details.  Significant events: 6/8>> admit for transient hypotension and alcohol withdrawal symptoms.  Significant studies: None  Antimicrobial therapy: None  Microbiology data: 6/8>> COVID PCR/influenza PCR: Negative  Procedures : None  Consults: None  DVT Prophylaxis : enoxaparin (LOVENOX) injection 40 mg Start: 04/20/21 1100   Subjective: Still very tremulous-but still awake and alert.   Assessment/Plan: Transient hypotension: Resolved with IV fluids.  Suspect hypotension could have been due to dehydration and continued use of antihypertensives.  EtOH withdrawal: Tremulous-but awake and alert-high risk to progress to DTs.  On scheduled Valium-will switch to librium and Ativan per CIWA scale.  Hyponatremia: Probably due to dehydration-improved with IV fluids.  Monitor periodically.  Cirrhosis-history of variceal bleeding: Relatively well compensated-counseled-regarding importance of stopping all alcohol use.  HTN: Cautiously continue Coreg-watch closely for recurrent hypotension.  Hold losartan  Depression: Continue Effexor  BPH: Continue Flomax  OSA: CPAP qhs if mental status stable  Obesity: Estimated body mass index is 35.44 kg/m as calculated from the following:   Height as of this encounter: 5\' 9"  (1.753 m).   Weight as of this encounter: 108.9 kg.    Diet: Diet Order             Diet 2 gram sodium Room service appropriate? Yes; Fluid consistency: Thin  Diet effective now                    Code Status: Full code    Family Communication: None at bedside.  Disposition Plan: Status is: Inpatient  Remains inpatient appropriate because:Inpatient level of care appropriate due to severity of illness  Dispo: The patient is from: Home              Anticipated d/c is to: Home              Patient currently is not medically stable to d/c.   Difficult to place patient No  Barriers to Discharge: Alcohol withdrawal symptoms-not yet stable for discharge.  Antimicrobial agents: Anti-infectives (From admission, onward)    None        Time spent: 35 minutes-Greater than 50% of this time was spent in counseling, explanation of diagnosis, planning of further management, and coordination of care.  MEDICATIONS: Scheduled Meds:  azelastine  1 spray Each Nare Daily   carvedilol  6.25 mg Oral BID   diazepam  5 mg Oral Q6H   docusate sodium  100 mg Oral BID   enoxaparin (LOVENOX) injection  40 mg Subcutaneous Q24H   feeding supplement  237 mL Oral BID BM   fluticasone furoate-vilanterol  1 puff Inhalation Daily   folic acid  1 mg Oral Daily   loratadine  10 mg Oral Daily   LORazepam  0-4 mg Intravenous Q4H   Followed by   Derrill Memo ON 04/22/2021] LORazepam  0-4 mg Intravenous Q8H   losartan  100 mg Oral Daily   montelukast  10 mg Oral QHS   multivitamin with  minerals  1 tablet Oral Daily   nicotine  14 mg Transdermal Daily   pantoprazole  40 mg Oral Daily   sodium chloride flush  3 mL Intravenous Q12H   tamsulosin  0.4 mg Oral Daily   thiamine  100 mg Oral Daily   venlafaxine XR  75 mg Oral Daily   Continuous Infusions:  lactated ringers 75 mL/hr at 04/21/21 0340   PRN Meds:.acetaminophen **OR** acetaminophen, albuterol, bisacodyl, hydrALAZINE, HYDROcodone-acetaminophen, LORazepam **OR** LORazepam, morphine injection, ondansetron **OR** ondansetron (ZOFRAN) IV, polyethylene glycol   PHYSICAL EXAM: Vital signs: Vitals:   04/21/21 0052 04/21/21 0404 04/21/21 0724 04/21/21 0815  BP: (!) 141/84  (!) 150/77  (!) 146/80  Pulse: 76 96 96 100  Resp: 18 20  19   Temp: 98.2 F (36.8 C) 99.6 F (37.6 C)  (!) 100.4 F (38 C)  TempSrc: Oral Oral  Oral  SpO2: 97% 97%  95%  Weight:      Height:       Filed Weights   04/19/21 2236  Weight: 108.9 kg   Body mass index is 35.44 kg/m.   Gen Exam:Alert awake-not in any distress HEENT:atraumatic, normocephalic Chest: B/L clear to auscultation anteriorly CVS:S1S2 regular Abdomen:soft non tender, non distended Extremities:no edema Neurology: Non focal Skin: no rash  I have personally reviewed following labs and imaging studies  LABORATORY DATA: CBC: Recent Labs  Lab 04/19/21 2324 04/19/21 2356 04/21/21 0114  WBC 11.2*  --  8.0  NEUTROABS 6.8  --   --   HGB 11.7* 12.9* 12.3*  HCT 34.6* 38.0* 37.2*  MCV 94.0  --  93.5  PLT 172  --  133*    Basic Metabolic Panel: Recent Labs  Lab 04/19/21 2324 04/19/21 2356 04/21/21 0114  NA 127* 131* 135  K 3.5 3.4* 3.7  CL 97*  --  100  CO2 18*  --  27  GLUCOSE 122*  --  100*  BUN 7  --  7  CREATININE 0.90  --  0.84  CALCIUM 7.8*  --  9.4    GFR: Estimated Creatinine Clearance: 129.4 mL/min (by C-G formula based on SCr of 0.84 mg/dL).  Liver Function Tests: Recent Labs  Lab 04/19/21 2324  AST 45*  ALT 34  ALKPHOS 65  BILITOT 0.7  PROT 6.3*  ALBUMIN 3.4*   Recent Labs  Lab 04/19/21 2324  LIPASE 60*   No results for input(s): AMMONIA in the last 168 hours.  Coagulation Profile: No results for input(s): INR, PROTIME in the last 168 hours.  Cardiac Enzymes: No results for input(s): CKTOTAL, CKMB, CKMBINDEX, TROPONINI in the last 168 hours.  BNP (last 3 results) No results for input(s): PROBNP in the last 8760 hours.  Lipid Profile: No results for input(s): CHOL, HDL, LDLCALC, TRIG, CHOLHDL, LDLDIRECT in the last 72 hours.  Thyroid Function Tests: No results for input(s): TSH, T4TOTAL, FREET4, T3FREE, THYROIDAB in the last 72 hours.  Anemia Panel: No  results for input(s): VITAMINB12, FOLATE, FERRITIN, TIBC, IRON, RETICCTPCT in the last 72 hours.  Urine analysis:    Component Value Date/Time   COLORURINE COLORLESS (A) 04/20/2021 0424   APPEARANCEUR CLEAR 04/20/2021 0424   APPEARANCEUR Clear 07/22/2018 1643   LABSPEC 1.001 (L) 04/20/2021 0424   PHURINE 6.0 04/20/2021 0424   GLUCOSEU NEGATIVE 04/20/2021 0424   HGBUR NEGATIVE 04/20/2021 0424   BILIRUBINUR NEGATIVE 04/20/2021 0424   BILIRUBINUR Negative 07/22/2018 [place] 04/20/2021 0424   PROTEINUR NEGATIVE 04/20/2021 0424  NITRITE NEGATIVE 04/20/2021 0424   LEUKOCYTESUR NEGATIVE 04/20/2021 0424    Sepsis Labs: Lactic Acid, Venous    Component Value Date/Time   LATICACIDVEN 1.58 03/03/2015 2104    MICROBIOLOGY: Recent Results (from the past 240 hour(s))  Resp Panel by RT-PCR (Flu A&B, Covid) Nasopharyngeal Swab     Status: None   Collection Time: 04/19/21 11:25 PM   Specimen: Nasopharyngeal Swab; Nasopharyngeal(NP) swabs in vial transport medium  Result Value Ref Range Status   SARS Coronavirus 2 by RT PCR NEGATIVE NEGATIVE Final    Comment: (NOTE) SARS-CoV-2 target nucleic acids are NOT DETECTED.  The SARS-CoV-2 RNA is generally detectable in upper respiratory specimens during the acute phase of infection. The lowest concentration of SARS-CoV-2 viral copies this assay can detect is 138 copies/mL. A negative result does not preclude SARS-Cov-2 infection and should not be used as the sole basis for treatment or other patient management decisions. A negative result may occur with  improper specimen collection/handling, submission of specimen other than nasopharyngeal swab, presence of viral mutation(s) within the areas targeted by this assay, and inadequate number of viral copies(<138 copies/mL). A negative result must be combined with clinical observations, patient history, and epidemiological information. The expected result is Negative.  Fact  Sheet for Patients:  EntrepreneurPulse.com.au  Fact Sheet for Healthcare Providers:  IncredibleEmployment.be  This test is no t yet approved or cleared by the [place] FDA and  has been authorized for detection and/or diagnosis of SARS-CoV-2 by FDA under an Emergency Use Authorization (EUA). This EUA will remain  in effect (meaning this test can be used) for the duration of the COVID-19 declaration under Section 564(b)(1) of the Act, 21 U.S.C.section 360bbb-3(b)(1), unless the authorization is terminated  or revoked sooner.       Influenza A by PCR NEGATIVE NEGATIVE Final   Influenza B by PCR NEGATIVE NEGATIVE Final    Comment: (NOTE) The Xpert Xpress SARS-CoV-2/FLU/RSV plus assay is intended as an aid in the diagnosis of influenza from Nasopharyngeal swab specimens and should not be used as a sole basis for treatment. Nasal washings and aspirates are unacceptable for Xpert Xpress SARS-CoV-2/FLU/RSV testing.  Fact Sheet for Patients: EntrepreneurPulse.com.au  Fact Sheet for Healthcare Providers: IncredibleEmployment.be  This test is not yet approved or cleared by the [place] FDA and has been authorized for detection and/or diagnosis of SARS-CoV-2 by FDA under an Emergency Use Authorization (EUA). This EUA will remain in effect (meaning this test can be used) for the duration of the COVID-19 declaration under Section 564(b)(1) of the Act, 21 U.S.C. section 360bbb-3(b)(1), unless the authorization is terminated or revoked.  Performed at [facility], [place] [address] [place], [place] [address]   MRSA PCR Screening     Status: None   Collection Time: 04/20/21 12:00 PM   Specimen: Nasopharyngeal  Result Value Ref Range Status   MRSA by PCR NEGATIVE NEGATIVE Final    Comment:        The GeneXpert MRSA Assay (FDA approved for NASAL specimens only), is one component of a comprehensive  MRSA colonization surveillance program. It is not intended to diagnose MRSA infection nor to guide or monitor treatment for MRSA infections. Performed at [facility], [place] [address] [place], [place] [address]     RADIOLOGY STUDIES/RESULTS: No results found.   LOS: 1 day   Oren Binet, MD  [facility]    To contact the attending provider between 7A-7P or the covering provider during  after hours 7P-7A, please log into the web site [email or web address] and access using universal [place] password for that web site. If you do not have the password, please call the hospital operator.  04/21/2021, 10:31 AM

## 2021-04-21 NOTE — Progress Notes (Signed)
Patient has removed IV again despite bilateral soft wrist restraints. Attempting to gain access again

## 2021-04-21 NOTE — Consult Note (Signed)
NAME:  Levi Alvarez, MRN:  [id number], DOB:  [birth year], LOS: 1 ADMISSION DATE:  04/19/2021, CONSULTATION DATE:  04/21/21 REFERRING MD:  Sloan Leiter- TRH, CHIEF COMPLAINT:  EtOH withdrawal  History of Present Illness:  50 yo M  PMH EtOH abuse, alcoholic cirrhosis, varices s/p band ligation, HTN COPD OSA presented to the ED with weakness 6/8. Associated visual disturbances. Pt consumes 20-24 drinks/day, last consumed 6/8. Was transiently hypotensive, but this improved. Admitted to [facility] 6/9 and started on CIWA, supportive vitamin therapies, [place] Valium. On 6/10 patient had worsening of withdrawal symptoms. His therapies were accordingly changed -- valium to librium. Over the course of the day, his CIWA scores increased. [name] increased and CCM consulted for evaluation.   Pertinent  Medical History  EtOH abuse  Alcoholic cirrhosis with varices HTN COPD  OSA DDD Hep C ED  Significant Hospital Events: Including procedures, antibiotic start and stop dates in addition to other pertinent events   6/8 presented to the ED with weakness 6/9 admitted to [facility] with etoh withdrawals, started on CIWA + [facility] valium 6/10 valium changed to librium with worsening sx. [name] increased, CCM consulted  Interim History / Subjective:  Recently received 20mg  [name] Calm slightly drowsy in bed  Had pulled out his own PIV, now in soft restraints   Says he feels "pretty good"   Objective   Blood pressure 100/67, pulse (!) 109, temperature 99.4 F (37.4 C), temperature source Oral, resp. rate 20, height 5\' 9"  (1.753 m), weight 108.9 kg, SpO2 97 %.        Intake/Output Summary (Last 24 hours) at 04/21/2021 1604 Last data filed at 04/21/2021 1004 Gross per 24 hour  Intake 1465.3 ml  Output 2315 ml  Net -849.7 ml   Filed Weights   04/19/21 2236  Weight: 108.9 kg    Examination: General: WDWN middle aged M supine in bed NAD HENT: NCAT. Pink mm.  Lungs: CTAb symmetrical chest expansion, even and  unlabored Cardiovascular:  regular rhythm. 2+ radial pulses  Abdomen: Obese soft generalized tenderness worse on LLQ  Extremities: No acute joint deformity, No cyanosis or clubbing.  Neuro: Drowsy,  awake, oriented x3.  GU: wnl   Labs/imaging that I havepersonally reviewed  (right click and "Reselect all SmartList Selections" daily)  6/10 BMP CBC   Resolved Hospital Problem list     Assessment & Plan:   Acute metabolic encephalopathy EtOH abuse with withdrawal P -Stable to remain in progressive care  -Continue CIWA -Increasing librium to 25mg  q6hr -continue Bvits, multivit -if concern for sedation, add EtCO2 monitor  -cessation resources  COPD without acute exacerbation Hx OSA -CPAP  P -albuterol, breo  -would elevate HOB -likely caution against CPAP qHS depending on mentation   Cirrhosis Hx Varices P -continue cessation efforts   HTN  P -coreg -losartan held   Depression P -cont home effexor   Thank you for consulting PCCM  At this time, pt does not require ICU transfer or initiation of Precedex infusion, though he is certainly at risk for needing adjunct precedex infusion.  Please re-engage if clinical status changes or if we can be of further assistance.    Best practice (right click and "Reselect all SmartList Selections" daily)  Diet:  Oral Pain/Anxiety/Delirium protocol (if indicated): No - CIWA VAP protocol (if indicated): Not indicated DVT prophylaxis: LMWH GI prophylaxis: PPI Glucose control:  N/a  Central venous access:  N/A Arterial line:  N/A Foley:  N/A Mobility:  bed rest  PT  consulted: N/A Last date of multidisciplinary goals of care discussion [per primary] Code Status:  full code Disposition: Progressive   Labs   CBC: Recent Labs  Lab 04/19/21 2324 04/19/21 2356 04/21/21 0114  WBC 11.2*  --  8.0  NEUTROABS 6.8  --   --   HGB 11.7* 12.9* 12.3*  HCT 34.6* 38.0* 37.2*  MCV 94.0  --  93.5  PLT 172  --  133*    Basic  Metabolic Panel: Recent Labs  Lab 04/19/21 2324 04/19/21 2356 04/21/21 0114  NA 127* 131* 135  K 3.5 3.4* 3.7  CL 97*  --  100  CO2 18*  --  27  GLUCOSE 122*  --  100*  BUN 7  --  7  CREATININE 0.90  --  0.84  CALCIUM 7.8*  --  9.4   GFR: Estimated Creatinine Clearance: 129.4 mL/min (by C-G formula based on SCr of 0.84 mg/dL). Recent Labs  Lab 04/19/21 2324 04/21/21 0114  WBC 11.2* 8.0    Liver Function Tests: Recent Labs  Lab 04/19/21 2324  AST 45*  ALT 34  ALKPHOS 65  BILITOT 0.7  PROT 6.3*  ALBUMIN 3.4*   Recent Labs  Lab 04/19/21 2324  LIPASE 60*   No results for input(s): AMMONIA in the last 168 hours.  ABG    Component Value Date/Time   PHART 7.348 (L) 04/19/2021 2356   PCO2ART 36.7 04/19/2021 2356   PO2ART 70 (L) 04/19/2021 2356   HCO3 20.3 04/19/2021 2356   TCO2 21 (L) 04/19/2021 2356   ACIDBASEDEF 5.0 (H) 04/19/2021 2356   O2SAT 94.0 04/19/2021 2356     Coagulation Profile: No results for input(s): INR, PROTIME in the last 168 hours.  Cardiac Enzymes: No results for input(s): CKTOTAL, CKMB, CKMBINDEX, TROPONINI in the last 168 hours.  HbA1C: Hgb A1c MFr Bld  Date/Time Value Ref Range Status  06/05/2017 11:29 AM 5.2 <5.7 % Final    Comment:      For the purpose of screening for the presence of diabetes:   <5.7%       Consistent with the absence of diabetes 5.7-6.4 %   Consistent with increased risk for diabetes (prediabetes) >=6.5 %     Consistent with diabetes   This assay result is consistent with a decreased risk of diabetes.   Currently, no consensus exists regarding use of hemoglobin A1c for diagnosis of diabetes in children.   According to American Diabetes Association (ADA) guidelines, hemoglobin A1c <7.0% represents optimal control in non-pregnant diabetic patients. Different metrics may apply to specific patient populations. Standards of Medical Care in Diabetes (ADA).       CBG: No results for input(s): GLUCAP in  the last 168 hours.  Review of Systems:   Unable to obtain -- encephalopathic   Past Medical History:  He,  has a past medical history of Alcoholism ([place]), Blood transfusion without reported diagnosis, Cirrhosis ([place]), Constipation, COPD (chronic obstructive pulmonary disease) ([place]), DDD (degenerative disc disease), cervical, ED (erectile dysfunction), Esophageal varices ([place]), GERD (gastroesophageal reflux disease), H/O: upper GI bleed, Hepatitis C antibody positive in blood, High blood pressure, History of anemia, Left inguinal hernia (2020), Lung nodule, Sleep apnea, and Umbilical hernia (6160).   Surgical History:   Past Surgical History:  Procedure Laterality Date   arm surgery     left arm,   COLONOSCOPY WITH PROPOFOL N/A 01/19/2019   Procedure: COLONOSCOPY WITH PROPOFOL;  Surgeon: Yetta Flock, MD;  Location: [facility] ENDOSCOPY;  Service: Gastroenterology;  Laterality: N/A;   ESOPHAGEAL BANDING  11/25/2018   Procedure: ESOPHAGEAL BANDING;  Surgeon: Milus Banister, MD;  Location: [facility];  Service: Endoscopy;;   ESOPHAGEAL BANDING  01/19/2019   Procedure: ESOPHAGEAL BANDING;  Surgeon: Yetta Flock, MD;  Location: [facility];  Service: Gastroenterology;;   ESOPHAGEAL BANDING N/A 01/24/2021   Procedure: ESOPHAGEAL BANDING;  Surgeon: Yetta Flock, MD;  Location: [facility];  Service: Gastroenterology;  Laterality: N/A;   ESOPHAGOGASTRODUODENOSCOPY (EGD) WITH PROPOFOL N/A 11/25/2018   Procedure: ESOPHAGOGASTRODUODENOSCOPY (EGD) WITH PROPOFOL;  Surgeon: Milus Banister, MD;  Location: [facility];  Service: Endoscopy;  Laterality: N/A;   ESOPHAGOGASTRODUODENOSCOPY (EGD) WITH PROPOFOL N/A 01/19/2019   Procedure: ESOPHAGOGASTRODUODENOSCOPY (EGD) WITH PROPOFOL;  Surgeon: Yetta Flock, MD;  Location: [facility];  Service: Gastroenterology;  Laterality: N/A;   ESOPHAGOGASTRODUODENOSCOPY (EGD) WITH PROPOFOL N/A 04/28/2019   Procedure: ESOPHAGOGASTRODUODENOSCOPY  (EGD) WITH PROPOFOL;  Surgeon: Yetta Flock, MD;  Location: [facility];  Service: Gastroenterology;  Laterality: N/A;   ESOPHAGOGASTRODUODENOSCOPY (EGD) WITH PROPOFOL N/A 11/03/2019   Procedure: ESOPHAGOGASTRODUODENOSCOPY (EGD) WITH PROPOFOL;  Surgeon: Doran Stabler, MD;  Location: [facility];  Service: Gastroenterology;  Laterality: N/A;   ESOPHAGOGASTRODUODENOSCOPY (EGD) WITH PROPOFOL N/A 01/24/2021   Procedure: ESOPHAGOGASTRODUODENOSCOPY (EGD) WITH PROPOFOL;  Surgeon: Yetta Flock, MD;  Location: [facility];  Service: Gastroenterology;  Laterality: N/A;   KNEE SURGERY Left    POLYPECTOMY  01/19/2019   Procedure: POLYPECTOMY;  Surgeon: Yetta Flock, MD;  Location: [facility];  Service: Gastroenterology;;     Social History:   reports that he has been smoking cigarettes. He has a 15.00 pack-year smoking history. He has quit using smokeless tobacco.  His smokeless tobacco use included chew. He reports current alcohol use. He reports current drug use. Drug: Marijuana.   Family History:  His family history includes Arthritis in his mother; Cancer in his father and paternal grandfather; Colitis in his mother. There is no history of Heart disease, Stroke, or Diabetes.   Allergies Allergies  Allergen Reactions   Gabapentin     Other reaction(s): restless leg   Naproxen Hives and Swelling    Other reaction(s): hives     Home Medications  Prior to Admission medications   Medication Sig Start Date End Date Taking? Authorizing Provider  acetaminophen (TYLENOL) 500 MG tablet Take 1,000 mg by mouth every 6 (six) hours as needed (pain.).   Yes [provider]  albuterol (PROVENTIL) (2.5 MG/3ML) 0.083% nebulizer solution Take 3 mLs (2.5 mg total) by nebulization every 6 (six) hours as needed for wheezing or shortness of breath. 12/02/18  Yes Gildardo Pounds, NP  azelastine (ASTELIN) 0.1 % nasal spray Place 1 spray into both nostrils daily. 03/21/21  Yes  [provider]  carvedilol (COREG) 6.25 MG tablet Take 6.25 mg by mouth 2 (two) times daily. 02/01/21  Yes [provider]  fluticasone furoate-vilanterol (BREO ELLIPTA) 200-25 MCG/INH AEPB Inhale 1 puff into the lungs daily. 09/25/19  Yes Gildardo Pounds, NP  loratadine (CLARITIN) 10 MG tablet Take 10 mg by mouth daily.   Yes [provider]  losartan (COZAAR) 100 MG tablet Take 100 mg by mouth daily. 12/24/20  Yes [provider]  meloxicam (MOBIC) 15 MG tablet Take 15 mg by mouth daily as needed for pain. 04/13/21  Yes [provider]  Milk Thistle 1000 MG CAPS Take 1,000 mg by mouth 2 (two) times daily.   Yes [provider]  montelukast (SINGULAIR) 10 MG tablet TAKE 1 TABLET (10 MG) BY ORAL ROUTE ONCE DAILY IN THE EVENING FOR ALLERGIC RHINITIS FOR 90 DAYS Patient taking differently: Take 10 mg by mouth at bedtime. 09/15/20 09/15/21 Yes Sonia Side., FNP  Multiple Vitamin (MULTIVITAMIN WITH MINERALS) TABS tablet Take 1 tablet by mouth daily.   Yes [provider]  pantoprazole (PROTONIX) 40 MG tablet TAKE 1 TABLET BY MOUTH 2 (TWO) TIMES DAILY BEFORE A MEAL FOR 30 DAYS. Patient taking differently: Take 40 mg by mouth daily. 01/07/20  Yes Gildardo Pounds, NP  sildenafil (VIAGRA) 50 MG tablet Take 50 mg by mouth daily as needed for erectile dysfunction.   Yes [provider]  tamsulosin (FLOMAX) 0.4 MG CAPS capsule TAKE 1 CAPSULE (0.4 MG TOTAL) BY MOUTH DAILY. Patient taking differently: Take 0.4 mg by mouth daily. 08/17/20 08/17/21 Yes Gildardo Pounds, NP  venlafaxine XR (EFFEXOR-XR) 75 MG 24 hr capsule TAKE 1 CAPSULE BY MOUTH DAILY. 08/15/20 08/15/21 Yes Deveron Furlong, NP  VENTOLIN HFA 108 (90 Base) MCG/ACT inhaler INHALE 1 PUFF EVERY 6 (SIX) HOURS AS NEEDED INTO THE LUNGS FOR WHEEZING OR SHORTNESS OF BREATH. Patient taking differently: Inhale 2 puffs into the lungs every 6 (six) hours as needed for wheezing or shortness of  breath. 05/19/20  Yes Charlott Rakes, MD  buPROPion (WELLBUTRIN SR) 150 MG 12 hr tablet Take 1 tablet (150 mg total) by mouth 2 (two) times daily. Patient not taking: No sig reported 03/28/20 04/27/20  Gildardo Pounds, NP  spironolactone (ALDACTONE) 100 MG tablet Take 1 tablet (100 mg total) by mouth daily. Patient not taking: No sig reported 04/09/20 07/08/20  Gildardo Pounds, NP     Critical care time: n/a     Eliseo Gum MSN, AGACNP-BC [place] for pager  04/21/2021, 5:01 PM

## 2021-04-21 NOTE — Progress Notes (Signed)
Overnight event  Patient with history of alcohol dependence admitted for alcohol withdrawal.  He was seen by PCCM this afternoon and dose of [name] was increased.  Despite receiving [name] and multiple doses of Ativan, he continues to have increasing symptoms.  He is confused and quite agitated.  As such, will benefit from transfer to the ICU for addition of Precedex.  Discussed with Dr. Orpah Melter who recommends giving IV Valium 5 mg, PCCM team will see the patient.

## 2021-04-21 NOTE — Progress Notes (Signed)
  PCCM Interval Note   Called by RRT to assess given ongoing agitation and CIWA of 28 despite cumulative total of ativan 18mg , librium, and recent valium 5mg  since 1400.    Patient still very agitated, trying to get out of bed, cursing.   Hemodynamically stable, other than tachypnea, intermittently mildly tachycardic   P:  Discussed with attending Dr. Orpah Melter Will tx to ICU for precedex infusion D/c ativan and librium  Load with phenobarbital now 500 mg IV, repeat with prn 130mg  for RASS goal >0 or < 2, or if > 2, 260mg      Updated patients significant other Levi Alvarez [phone] updated     CCT 20 mins  Levi Alvarez, ACNP [facility] 04/21/2021, 11:41 PM

## 2021-04-21 NOTE — Progress Notes (Signed)
At 2230H, informed Dr. Marlowe Sax about patient's CIWA and ativan given.  Patient is agitated and confused. Visual hallucination also noted.   At 2320H, rapid response nurse came to assess the patient.  2330H, ICU nurse and physician came to evaluate the patient. To be moved to ICU per order.

## 2021-04-21 NOTE — Progress Notes (Signed)
   04/21/21 1503  Assess: MEWS Score  Temp 99.4 F (37.4 C)  BP 100/67  Pulse Rate (!) 109  ECG Heart Rate (!) 110  Resp 20  Level of Consciousness Alert  SpO2 97 %  O2 Device Room Air  Assess: MEWS Score  MEWS Temp 0  MEWS Systolic 1  MEWS Pulse 1  MEWS RR 0  MEWS LOC 0  MEWS Score 2  MEWS Score Color Yellow  Assess: if the MEWS score is Yellow or Red  Were vital signs taken at a resting state? Yes  Focused Assessment Change from prior assessment (see assessment flowsheet)  Early Detection of Sepsis Score *See Row Information* Medium  MEWS guidelines implemented *See Row Information* Yes  Treat  MEWS Interventions Administered scheduled meds/treatments;Administered prn meds/treatments;Escalated (See documentation below)  Take Vital Signs  Increase Vital Sign Frequency  Yellow: Q 2hr X 2 then Q 4hr X 2, if remains yellow, continue Q 4hrs  Escalate  MEWS: Escalate Yellow: discuss with charge nurse/RN and consider discussing with provider and RRT  Notify: Charge Nurse/RN  Name of Charge Nurse/RN Notified Miguel Aschoff, RN  Date Charge Nurse/RN Notified 04/21/21  Time Charge Nurse/RN Notified 1513  Notify: Provider  Provider Name/Title Oren Binet, MD  Date Provider Notified 04/21/21  Time Provider Notified 1513  Notification Type Page  Notification Reason Change in status;Requested by patient/family  Provider response At bedside;See new orders  Date of Provider Response 04/21/21  Time of Provider Response 1505  Notify: Rapid Response  Name of Rapid Response RN Notified N/A  Document  Patient Outcome Stabilized after interventions  Progress note created (see row info) Yes

## 2021-04-22 LAB — GLUCOSE, CAPILLARY
Glucose-Capillary: 100 mg/dL — ABNORMAL HIGH (ref 70–99)
Glucose-Capillary: 108 mg/dL — ABNORMAL HIGH (ref 70–99)
Glucose-Capillary: 110 mg/dL — ABNORMAL HIGH (ref 70–99)
Glucose-Capillary: 113 mg/dL — ABNORMAL HIGH (ref 70–99)
Glucose-Capillary: 118 mg/dL — ABNORMAL HIGH (ref 70–99)
Glucose-Capillary: 137 mg/dL — ABNORMAL HIGH (ref 70–99)
Glucose-Capillary: 94 mg/dL (ref 70–99)

## 2021-04-22 LAB — COMPREHENSIVE METABOLIC PANEL
ALT: 42 U/L (ref 0–44)
AST: 58 U/L — ABNORMAL HIGH (ref 15–41)
Albumin: 2.8 g/dL — ABNORMAL LOW (ref 3.5–5.0)
Alkaline Phosphatase: 56 U/L (ref 38–126)
Anion gap: 9 (ref 5–15)
BUN: 11 mg/dL (ref 6–20)
CO2: 25 mmol/L (ref 22–32)
Calcium: 8.3 mg/dL — ABNORMAL LOW (ref 8.9–10.3)
Chloride: 98 mmol/L (ref 98–111)
Creatinine, Ser: 1.22 mg/dL (ref 0.61–1.24)
GFR, Estimated: >60 mL/min (ref >60)
Glucose, Bld: 103 mg/dL — ABNORMAL HIGH (ref 70–99)
Potassium: 3.3 mmol/L — ABNORMAL LOW (ref 3.5–5.1)
Sodium: 132 mmol/L — ABNORMAL LOW (ref 135–145)
Total Bilirubin: 1 mg/dL (ref 0.3–1.2)
Total Protein: 5.7 g/dL — ABNORMAL LOW (ref 6.5–8.1)

## 2021-04-22 LAB — CBC
HCT: 32.9 % — ABNORMAL LOW (ref 39.0–52.0)
Hemoglobin: 11.2 g/dL — ABNORMAL LOW (ref 13.0–17.0)
MCH: 31.5 pg (ref 26.0–34.0)
MCHC: 34 g/dL (ref 30.0–36.0)
MCV: 92.7 fL (ref 80.0–100.0)
Platelets: UNDETERMINED 10*3/uL (ref 150–400)
RBC: 3.55 MIL/uL — ABNORMAL LOW (ref 4.22–5.81)
RDW: 15.4 % (ref 11.5–15.5)
WBC: 10.6 10*3/uL — ABNORMAL HIGH (ref 4.0–10.5)
nRBC: 0 % (ref 0.0–0.2)

## 2021-04-22 LAB — BRAIN NATRIURETIC PEPTIDE: B Natriuretic Peptide: 85.9 pg/mL (ref 0.0–100.0)

## 2021-04-22 LAB — MAGNESIUM: Magnesium: 1.4 mg/dL — ABNORMAL LOW (ref 1.7–2.4)

## 2021-04-22 LAB — PHOSPHORUS: Phosphorus: 3.5 mg/dL (ref 2.5–4.6)

## 2021-04-22 LAB — AMMONIA: Ammonia: 45 umol/L — ABNORMAL HIGH (ref 9–35)

## 2021-04-22 MED ORDER — NOREPINEPHRINE 4 MG/250ML-% IV SOLN
2.0000 ug/min | INTRAVENOUS | Status: DC
  Administered 2021-04-22: 2 ug/min via INTRAVENOUS
  Filled 2021-04-22: qty 250

## 2021-04-22 MED ORDER — THIAMINE HCL 100 MG PO TABS
100.0000 mg | ORAL_TABLET | Freq: Every day | ORAL | Status: DC
  Administered 2021-04-23 – 2021-04-24 (×2): 100 mg via ORAL
  Filled 2021-04-22 (×2): qty 1

## 2021-04-22 MED ORDER — SODIUM CHLORIDE 0.9 % IV SOLN
1.0000 mg | Freq: Once | INTRAVENOUS | Status: AC
  Administered 2021-04-22: 1 mg via INTRAVENOUS
  Filled 2021-04-22: qty 0.2

## 2021-04-22 MED ORDER — THIAMINE HCL 100 MG/ML IJ SOLN
100.0000 mg | Freq: Once | INTRAMUSCULAR | Status: AC
  Administered 2021-04-22: 100 mg via INTRAVENOUS
  Filled 2021-04-22: qty 2

## 2021-04-22 MED ORDER — SODIUM CHLORIDE 0.9 % IV SOLN
250.0000 mL | INTRAVENOUS | Status: DC
  Administered 2021-04-22: 250 mL via INTRAVENOUS

## 2021-04-22 MED ORDER — FOLIC ACID 1 MG PO TABS
1.0000 mg | ORAL_TABLET | Freq: Every day | ORAL | Status: DC
  Administered 2021-04-23 – 2021-04-24 (×2): 1 mg via ORAL
  Filled 2021-04-22 (×2): qty 1

## 2021-04-22 MED ORDER — CHLORHEXIDINE GLUCONATE CLOTH 2 % EX PADS
6.0000 | MEDICATED_PAD | Freq: Every day | CUTANEOUS | Status: DC
  Administered 2021-04-22 – 2021-04-23 (×2): 6 via TOPICAL

## 2021-04-22 MED ORDER — SODIUM CHLORIDE 0.9 % IV BOLUS
500.0000 mL | Freq: Once | INTRAVENOUS | Status: AC
  Administered 2021-04-22: 500 mL via INTRAVENOUS

## 2021-04-22 MED ORDER — MAGNESIUM SULFATE 4 GM/100ML IV SOLN
4.0000 g | Freq: Once | INTRAVENOUS | Status: AC
  Administered 2021-04-22: 4 g via INTRAVENOUS
  Filled 2021-04-22: qty 100

## 2021-04-22 NOTE — Progress Notes (Signed)
[place] Progress Note Patient Name: KOHLTON GILPATRICK DOB: 26-Dec-1970 MRN: [id number]   Date of Service  04/22/2021  HPI/Events of Note  Patient is hypotensive. He is on BIPAP with saturation 92-93 %.  eICU Interventions  LR 500 ml iv bolus x 1 ordered, peripheral Norepinephrine protocol ordered, BNP to assess volume status ordered.        Kerry Kass Jacoby Ritsema 04/22/2021, 3:21 AM

## 2021-04-22 NOTE — Progress Notes (Addendum)
0030H, transfrer to 3M11 with belongings.

## 2021-04-22 NOTE — Significant Event (Signed)
Rapid Response Event Note   Reason for Call :  Extreme agitation, increased CIWA  Initial Focused Assessment:  Pt extremely agitated, attempting to get OOB and remove restraints. Pt alert to self but disoriented to place, time, and situation. He does say he has a headache. Lungs CTA. ABD large/soft. Skin hot and diaphoretic.   T-98.1, HR-106, BP-127/69, RR-32, SpO2-95% on RA, CIWA-28  Pt has received 23mg  Ativan(PO/IV) and 25mg  librium PO since 1254.   Interventions:  Valium 5mg  IV x 1 Precedex gtt  Tx to  ICU Plan of Care:  Pt txed to 3M11   Event Summary:   MD Notified: Dr. Marlowe Sax [facility]), Dr. Coral Else) notified and came to beside Call [place], Aldona Bryner Anderson, RN

## 2021-04-22 NOTE — Progress Notes (Signed)
RT took patient off bipap at this time and placed him on a 5L salter. Pt appears to be tolerating well at this time.

## 2021-04-22 NOTE — Progress Notes (Signed)
[place] Progress Note Patient Name: Levi Alvarez DOB: 1971/03/20 MRN: [id number]   Date of Service  04/22/2021  HPI/Events of Note  Patient hospitalized for ETOH withdrawal delirium, then transferred to the ICU to be placed on Precedex gtt for extreme agitation despite benzodiazepine therapy.  eICU Interventions  New Patient Evaluation.        Sophi Calligan U Savino Whisenant 04/22/2021, 1:01 AM

## 2021-04-22 NOTE — Progress Notes (Signed)
[place] Progress Note Patient Name: Levi Alvarez DOB: 04-Jun-1971 MRN: [id number]   Date of Service  04/22/2021  HPI/Events of Note  Mg++ 1.4  eICU Interventions  Mg sulfate 4 gm iv bolus ordered.        Kerry Kass Hurbert Duran 04/22/2021, 6:21 AM

## 2021-04-22 NOTE — Consult Note (Signed)
NAME:  Levi Alvarez, MRN:  [id number], DOB:  04/30/1971, LOS: 2 ADMISSION DATE:  04/19/2021, CONSULTATION DATE:  04/21/21 REFERRING MD:  Sloan Leiter- TRH, CHIEF COMPLAINT:  EtOH withdrawal  History of Present Illness:  50 yo M  PMH EtOH abuse, alcoholic cirrhosis, varices s/p band ligation, HTN COPD OSA presented to the ED with weakness 6/8. Associated visual disturbances. Pt consumes 20-24 drinks/day, last consumed 6/8. Was transiently hypotensive, but this improved. Admitted to [facility] 6/9 and started on CIWA, supportive vitamin therapies, [place] Valium. On 6/10 patient had worsening of withdrawal symptoms. His therapies were accordingly changed -- valium to librium. Over the course of the day, his CIWA scores increased. [name] increased and CCM consulted for evaluation.   Pertinent  Medical History  EtOH abuse  Alcoholic cirrhosis with varices HTN COPD  OSA DDD Hep C ED   Significant Hospital Events: Including procedures, antibiotic start and stop dates in addition to other pertinent events   6/8 presented to the ED with weakness 6/9 admitted to [facility] with etoh withdrawals, started on CIWA + [facility] valium 6/10 valium changed to librium with worsening sx. [name] increased, CCM consulted 6/11 transfer to ICU for Precedex briefly, started on Levophed for hypotension  Interim History / Subjective:   On BiPAP, awake Low-dose Levophed.  Off Precedex  Objective   Blood pressure 97/66, pulse 63, temperature (!) 97.4 F (36.3 C), temperature source Axillary, resp. rate (!) 21, height 5\' 9"  (1.753 m), weight 100.7 kg, SpO2 97 %.    FiO2 (%):  [70 %] 70 %   Intake/Output Summary (Last 24 hours) at 04/22/2021 1121 Last data filed at 04/22/2021 1100 Gross per 24 hour  Intake 1686.43 ml  Output 500 ml  Net 1186.43 ml   Filed Weights   04/19/21 2236 04/22/21 0100  Weight: 108.9 kg 100.7 kg    Examination: Gen:      No acute distress HEENT:  EOMI, sclera anicteric Neck:     No masses; no  thyromegaly Lungs:    Clear to auscultation bilaterally; normal respiratory effort CV:         Regular rate and rhythm; no murmurs Abd:      + bowel sounds; soft, non-tender; no palpable masses, no distension Ext:    No edema; adequate peripheral perfusion Skin:      Warm and dry; no rash Neuro: alert and oriented x 3 Psych: normal mood and affect   Labs/imaging that I havepersonally reviewed  (right click and "Reselect all SmartList Selections" daily)  6/10 BMP CBC   Resolved Hospital Problem list     Assessment & Plan:  Acute metabolic encephalopathy EtOH abuse with withdrawal P Got a dose of phenobarb which will stay around for a while Has additional doses of phenobarb as needed -Continue CIWA Of [name] and Precedex Take off BiPAP and monitor Thiamine, folate  COPD without acute exacerbation Hx OSA -CPAP  P Taken off BiPAP and monitor Continue bronchodilator  Cirrhosis Hx Varices P Monitor labs  Shock Likely due to sedating meds.  He is on minimal pressors.  We will wean this off  HTN  P Blood pressure medications due to hypotension  Depression P Home Effexor   Best practice (right click and "Reselect all SmartList Selections" daily)  Diet:  Oral Pain/Anxiety/Delirium protocol (if indicated): No - CIWA VAP protocol (if indicated): Not indicated DVT prophylaxis: LMWH GI prophylaxis: PPI Glucose control:  N/a  Central venous access:  N/A Arterial line:  N/A Foley:  N/A Mobility:  bed rest  PT consulted: N/A Last date of multidisciplinary goals of care discussion [per primary] Code Status:  full code Disposition: Progressive   Critical care time:    The patient is critically ill with multiple organ system failure and requires high complexity decision making for assessment and support, frequent evaluation and titration of therapies, advanced monitoring, review of radiographic studies and interpretation of complex data.   Critical Care Time  devoted to patient care services, exclusive of separately billable procedures, described in this note is 45 minutes.   Marshell Garfinkel MD [place] [facility] See Amion for pager  If no response to pager , please call [phone] until 7pm After 7:00 pm call Elink  [phone] 04/22/2021, 11:22 AM

## 2021-04-23 ENCOUNTER — Inpatient Hospital Stay (HOSPITAL_COMMUNITY): Payer: Medicare Other

## 2021-04-23 LAB — BASIC METABOLIC PANEL
Anion gap: 8 (ref 5–15)
BUN: 10 mg/dL (ref 6–20)
CO2: 25 mmol/L (ref 22–32)
Calcium: 8.4 mg/dL — ABNORMAL LOW (ref 8.9–10.3)
Chloride: 98 mmol/L (ref 98–111)
Creatinine, Ser: 0.75 mg/dL (ref 0.61–1.24)
GFR, Estimated: >60 mL/min (ref >60)
Glucose, Bld: 103 mg/dL — ABNORMAL HIGH (ref 70–99)
Potassium: 3.1 mmol/L — ABNORMAL LOW (ref 3.5–5.1)
Sodium: 131 mmol/L — ABNORMAL LOW (ref 135–145)

## 2021-04-23 LAB — CBC
HCT: 33.9 % — ABNORMAL LOW (ref 39.0–52.0)
Hemoglobin: 11.4 g/dL — ABNORMAL LOW (ref 13.0–17.0)
MCH: 31.1 pg (ref 26.0–34.0)
MCHC: 33.6 g/dL (ref 30.0–36.0)
MCV: 92.6 fL (ref 80.0–100.0)
Platelets: 80 10*3/uL — ABNORMAL LOW (ref 150–400)
RBC: 3.66 MIL/uL — ABNORMAL LOW (ref 4.22–5.81)
RDW: 15.2 % (ref 11.5–15.5)
WBC: 7 10*3/uL (ref 4.0–10.5)
nRBC: 0 % (ref 0.0–0.2)

## 2021-04-23 LAB — GLUCOSE, CAPILLARY
Glucose-Capillary: 101 mg/dL — ABNORMAL HIGH (ref 70–99)
Glucose-Capillary: 92 mg/dL (ref 70–99)
Glucose-Capillary: 84 mg/dL (ref 70–99)
Glucose-Capillary: 120 mg/dL — ABNORMAL HIGH (ref 70–99)
Glucose-Capillary: 109 mg/dL — ABNORMAL HIGH (ref 70–99)

## 2021-04-23 LAB — PHOSPHORUS: Phosphorus: 2.8 mg/dL (ref 2.5–4.6)

## 2021-04-23 LAB — MAGNESIUM: Magnesium: 1.8 mg/dL (ref 1.7–2.4)

## 2021-04-23 MED ORDER — POTASSIUM CHLORIDE 10 MEQ/100ML IV SOLN
10.0000 meq | INTRAVENOUS | Status: AC
  Administered 2021-04-23 (×4): 10 meq via INTRAVENOUS
  Filled 2021-04-23 (×4): qty 100

## 2021-04-23 MED ORDER — LORAZEPAM 1 MG PO TABS
1.0000 mg | ORAL_TABLET | ORAL | Status: DC | PRN
  Administered 2021-04-23 – 2021-04-24 (×3): 1 mg via ORAL
  Filled 2021-04-23 (×3): qty 1

## 2021-04-23 MED ORDER — CALCIUM CARBONATE ANTACID 500 MG PO CHEW
1.0000 | CHEWABLE_TABLET | Freq: Four times a day (QID) | ORAL | Status: DC | PRN
  Administered 2021-04-23: 200 mg via ORAL
  Filled 2021-04-23: qty 1

## 2021-04-23 MED ORDER — MAGNESIUM SULFATE 2 GM/50ML IV SOLN
2.0000 g | Freq: Once | INTRAVENOUS | Status: AC
  Administered 2021-04-23: 2 g via INTRAVENOUS
  Filled 2021-04-23: qty 50

## 2021-04-23 MED ORDER — POTASSIUM CHLORIDE CRYS ER 20 MEQ PO TBCR
40.0000 meq | EXTENDED_RELEASE_TABLET | Freq: Once | ORAL | Status: AC
  Administered 2021-04-23: 40 meq via ORAL
  Filled 2021-04-23: qty 2

## 2021-04-23 NOTE — Progress Notes (Signed)
NAME:  Levi Alvarez, MRN:  [id number], DOB:  Feb 21, 1971, LOS: 3 ADMISSION DATE:  04/19/2021, CONSULTATION DATE:  04/21/21 REFERRING MD:  Sloan Leiter- TRH, CHIEF COMPLAINT:  EtOH withdrawal  History of Present Illness:  50 yo M  PMH EtOH abuse, alcoholic cirrhosis, varices s/p band ligation, HTN COPD OSA presented to the ED with weakness 6/8. Associated visual disturbances. Pt consumes 20-24 drinks/day, last consumed 6/8. Was transiently hypotensive, but this improved. Admitted to [facility] 6/9 and started on CIWA, supportive vitamin therapies, [place] Valium. On 6/10 patient had worsening of withdrawal symptoms. His therapies were accordingly changed -- valium to librium. Over the course of the day, his CIWA scores increased. [name] increased and CCM consulted for evaluation.   Pertinent  Medical History  EtOH abuse  Alcoholic cirrhosis with varices HTN COPD  OSA DDD Hep C ED   Significant Hospital Events: Including procedures, antibiotic start and stop dates in addition to other pertinent events   6/8 presented to the ED with weakness 6/9 admitted to [facility] with etoh withdrawals, started on CIWA + [facility] valium 6/10 valium changed to librium with worsening sx. [name] increased, CCM consulted 6/11 transfer to ICU for Precedex briefly, started on Levophed for hypotension 6/12 Off pressors and BiPAP, transferred back to [facility]  Interim History / Subjective:   Has been off BiPAP and Levophed and Precedex for past 24 hours  Objective   Blood pressure 114/84, pulse 61, temperature 98.1 F (36.7 C), temperature source Axillary, resp. rate 18, height 5\' 9"  (1.753 m), weight 100.7 kg, SpO2 99 %.    FiO2 (%):  [40 %] 40 %   Intake/Output Summary (Last 24 hours) at 04/23/2021 0828 Last data filed at 04/23/2021 0800 Gross per 24 hour  Intake 1152.61 ml  Output 1500 ml  Net -347.39 ml   Filed Weights   04/19/21 2236 04/22/21 0100  Weight: 108.9 kg 100.7 kg    Examination: Blood pressure 114/84, pulse  61, temperature 98.1 F (36.7 C), temperature source Axillary, resp. rate 18, height 5\' 9"  (1.753 m), weight 100.7 kg, SpO2 99 %. Gen:      No acute distress HEENT:  EOMI, sclera anicteric Neck:     No masses; no thyromegaly Lungs:    Clear to auscultation bilaterally; normal respiratory effort CV:         Regular rate and rhythm; no murmurs Abd:      + bowel sounds; soft, non-tender; no palpable masses, no distension Ext:    No edema; adequate peripheral perfusion Skin:      Warm and dry; no rash Neuro: alert and oriented x 3 Psych: normal mood and affect   Labs/imaging that I havepersonally reviewed  (right click and "Reselect all SmartList Selections" daily)   Potassium 3.1, WBC 7, platelets 80 Chest x-ray with cardiomegaly  Resolved Hospital Problem list   Shock  Assessment & Plan:  Acute metabolic encephalopathy EtOH abuse with withdrawal P Got a dose of phenobarb which will stay around for a while Has additional doses of phenobarb as needed Add CIWA protocol Off [name] and Precedex Thiamine, folate  COPD without acute exacerbation Hx OSA -CPAP  P Continue bronchodilator CPAP at night  Cirrhosis Hx Varices P Monitor labs   HTN  P Holding blood pressure medications  Depression P Home Effexor   Best practice (right click and "Reselect all SmartList Selections" daily)  Diet:  Oral Pain/Anxiety/Delirium protocol (if indicated): No - CIWA VAP protocol (if indicated): Not indicated DVT prophylaxis: LMWH  GI prophylaxis: PPI Glucose control:  N/a  Central venous access:  N/A Arterial line:  N/A Foley:  N/A Mobility:  bed rest  PT consulted: N/A Last date of multidisciplinary goals of care discussion [per primary] Code Status:  full code Disposition: To progressive and back to [facility] service  Signature:   Marshell Garfinkel MD [place] [facility] See Amion for pager  If no response to pager , please call [phone] until 7pm After  7:00 pm call Elink  [phone] 04/23/2021, 8:38 AM

## 2021-04-23 NOTE — Progress Notes (Signed)
[place] Progress Note Patient Name: Levi Alvarez DOB: [birth year] MRN: [id number]   Date of Service  04/23/2021  HPI/Events of Note  Mg 1.8, K 3.1.  eICU Interventions  Ordered 40 mEq KCl IV and 40 MEq KCl PO (for when he is taking a break from BiPAP). Ordered 1g magnesium sulfate.     Intervention Category Minor Interventions: Electrolytes abnormality - evaluation and management  Marily Lente Kelcey Korus 04/23/2021, 4:30 AM

## 2021-04-24 DIAGNOSIS — F10930 Alcohol use, unspecified with withdrawal, uncomplicated: Secondary | ICD-10-CM

## 2021-04-24 DIAGNOSIS — F10230 Alcohol dependence with withdrawal, uncomplicated: Secondary | ICD-10-CM

## 2021-04-24 DIAGNOSIS — J449 Chronic obstructive pulmonary disease, unspecified: Secondary | ICD-10-CM

## 2021-04-24 LAB — CBC
HCT: 33.3 % — ABNORMAL LOW (ref 39.0–52.0)
Hemoglobin: 11.3 g/dL — ABNORMAL LOW (ref 13.0–17.0)
MCH: 31.2 pg (ref 26.0–34.0)
MCHC: 33.9 g/dL (ref 30.0–36.0)
MCV: 92 fL (ref 80.0–100.0)
Platelets: 78 10*3/uL — ABNORMAL LOW (ref 150–400)
RBC: 3.62 MIL/uL — ABNORMAL LOW (ref 4.22–5.81)
RDW: 14.9 % (ref 11.5–15.5)
WBC: 5.4 10*3/uL (ref 4.0–10.5)
nRBC: 0 % (ref 0.0–0.2)

## 2021-04-24 LAB — BASIC METABOLIC PANEL
Anion gap: 8 (ref 5–15)
BUN: 8 mg/dL (ref 6–20)
CO2: 25 mmol/L (ref 22–32)
Calcium: 8.5 mg/dL — ABNORMAL LOW (ref 8.9–10.3)
Chloride: 101 mmol/L (ref 98–111)
Creatinine, Ser: 0.75 mg/dL (ref 0.61–1.24)
GFR, Estimated: >60 mL/min (ref >60)
Glucose, Bld: 103 mg/dL — ABNORMAL HIGH (ref 70–99)
Potassium: 3.5 mmol/L (ref 3.5–5.1)
Sodium: 134 mmol/L — ABNORMAL LOW (ref 135–145)

## 2021-04-24 LAB — MAGNESIUM: Magnesium: 1.6 mg/dL — ABNORMAL LOW (ref 1.7–2.4)

## 2021-04-24 LAB — PHOSPHORUS: Phosphorus: 3.2 mg/dL (ref 2.5–4.6)

## 2021-04-24 MED ORDER — MAGNESIUM SULFATE 2 GM/50ML IV SOLN
2.0000 g | Freq: Once | INTRAVENOUS | Status: AC
  Administered 2021-04-24: 2 g via INTRAVENOUS
  Filled 2021-04-24: qty 50

## 2021-04-24 MED ORDER — PANTOPRAZOLE SODIUM 40 MG PO TBEC: 40.0000 mg | DELAYED_RELEASE_TABLET | Freq: Every day | ORAL | Status: DC

## 2021-04-24 NOTE — Plan of Care (Signed)

## 2021-04-24 NOTE — Progress Notes (Signed)
O2 assessment completed 96% at resting on RA 99% with ambulation on RA

## 2021-04-24 NOTE — Discharge Summary (Signed)
Physician Discharge Summary       Patient ID: Levi Alvarez MRN: [id number] DOB/AGE: 1971/01/12 50 y.o.  Admit date: 04/19/2021 Discharge date: 04/24/2021  Discharge Diagnoses:  Principal Problem:   Alcohol dependence with withdrawal with complication [facility]) Active Problems:   Essential hypertension   OSA (obstructive sleep apnea)   Class 2 obesity due to excess calories with body mass index (BMI) of 35.0 to 35.9 in adult   Smoker   Hyperlipidemia   Cirrhosis (HCC)   Chronic obstructive pulmonary disease (HCC)   Hypotension   Marijuana abuse, continuous   History of Present Illness: 50 yo M  PMH EtOH abuse, alcoholic cirrhosis, varices s/p band ligation, HTN COPD OSA presented to the ED with weakness 6/8. Associated visual disturbances. Pt consumes 20-24 drinks/day, last consumed 6/8. Was transiently hypotensive, but this improved.    Hospital Course:  Admitted to [facility] 6/9 and started on CIWA, supportive vitamin therapies, [place] Valium. On 6/10 patient had worsening of withdrawal symptoms. His therapies were accordingly changed - valium to librium. Over the course of the day, his CIWA scores increased. [name] increased and CCM consulted for evaluation. He was ultimately transferred to the ICU. He was given a dose of phenobarbital and ultimately was transferred to the ICU where he was briefly started on precedex. He was quickly weaned off after about two hours on 6/11. 6/12 symptoms were greatly improved and he was transferred out of the ICU. 6/13 he was considered a candidate for discharge.    Discharge Plan by active problems    EtOH abuse with withdrawal - Got a dose of phenobarb which will stay around for a while - Patient to arrange follow-up with PCP - Patient's girlfriend has plans to call a therapist she has a relationship with for the patient  COPD - continue Breo, PRN albuterol - Home claritin, singulair  Cirrhosis - has an appointment with Dr. Havery Moros scheduled.    HTN - Resume home coreg, losartan  Depression - continue home [facility] tests/ studies   Consults   Discharge Exam: BP 125/82   Pulse 82   Temp 98.6 F (37 C) (Oral)   Resp 19   Ht 5\' 9"  (1.753 m)   Wt 98.5 kg Comment: scale b  SpO2 96%   BMI 32.06 kg/m   General:  middle aged male in NAD Neuro:  Awake, alert, oriented, non-focal HEENT:  [place]/AT, No JVD noted, PERRL Cardiovascular:  RRR, no MRG Lungs:  Clear bilateral breath sounds Abdomen:  Soft, non-distended, non-tender Musculoskeletal:  No acute deformity or ROM limitation Skin:  Intact, MMM   Labs at discharge Lab Results  Component Value Date   CREATININE 0.75 04/24/2021   BUN 8 04/24/2021   NA 134 (L) 04/24/2021   K 3.5 04/24/2021   CL 101 04/24/2021   CO2 25 04/24/2021   Lab Results  Component Value Date   WBC 5.4 04/24/2021   HGB 11.3 (L) 04/24/2021   HCT 33.3 (L) 04/24/2021   MCV 92.0 04/24/2021   PLT 78 (L) 04/24/2021   Lab Results  Component Value Date   ALT 42 04/22/2021   AST 58 (H) 04/22/2021   ALKPHOS 56 04/22/2021   BILITOT 1.0 04/22/2021   Lab Results  Component Value Date   INR 1.2 (H) 12/27/2020   INR 1.1 (H) 09/25/2019   INR 1.25 11/26/2018    Current radiology studies DG Chest Port 1 View  Result Date: 04/23/2021 CLINICAL DATA:  Acute  respiratory failure EXAM: PORTABLE CHEST 1 VIEW COMPARISON:  04/10/2021 FINDINGS: Midline trachea. Mild cardiomegaly. Mild left hemidiaphragm elevation. No pleural effusion or pneumothorax. No congestive failure. Clear lungs. IMPRESSION: Cardiomegaly without congestive failure. Electronically Signed   By: Abigail Miyamoto M.D.   On: 04/23/2021 08:26    Disposition:   Discharge disposition: 01-Home or Self Care       Allergies as of 04/24/2021       Reactions   Gabapentin    Other reaction(s): restless leg   Naproxen Hives, Swelling   Other reaction(s): hives        Medication List     STOP taking these  medications    buPROPion 150 MG 12 hr tablet Commonly known as: Wellbutrin SR   meloxicam 15 MG tablet Commonly known as: MOBIC   spironolactone 100 MG tablet Commonly known as: ALDACTONE       TAKE these medications    acetaminophen 500 MG tablet Commonly known as: TYLENOL Take 1,000 mg by mouth every 6 (six) hours as needed (pain.).   albuterol (2.5 MG/3ML) 0.083% nebulizer solution Commonly known as: PROVENTIL Take 3 mLs (2.5 mg total) by nebulization every 6 (six) hours as needed for wheezing or shortness of breath. What changed: Another medication with the same name was changed. Make sure you understand how and when to take each.   Ventolin HFA 108 (90 Base) MCG/ACT inhaler Generic drug: albuterol INHALE 1 PUFF EVERY 6 (SIX) HOURS AS NEEDED INTO THE LUNGS FOR WHEEZING OR SHORTNESS OF BREATH. What changed: See the new instructions.   azelastine 0.1 % nasal spray Commonly known as: ASTELIN Place 1 spray into both nostrils daily.   Breo Ellipta 200-25 MCG/INH Aepb Generic drug: fluticasone furoate-vilanterol Inhale 1 puff into the lungs daily.   carvedilol 6.25 MG tablet Commonly known as: COREG Take 6.25 mg by mouth 2 (two) times daily.   loratadine 10 MG tablet Commonly known as: CLARITIN Take 10 mg by mouth daily.   losartan 100 MG tablet Commonly known as: COZAAR Take 100 mg by mouth daily.   Milk Thistle 1000 MG Caps Take 1,000 mg by mouth 2 (two) times daily.   montelukast 10 MG tablet Commonly known as: SINGULAIR TAKE 1 TABLET (10 MG) BY ORAL ROUTE ONCE DAILY IN THE EVENING FOR ALLERGIC RHINITIS FOR 90 DAYS What changed:  how much to take how to take this when to take this   multivitamin with minerals Tabs tablet Take 1 tablet by mouth daily.   pantoprazole 40 MG tablet Commonly known as: PROTONIX Take 1 tablet (40 mg total) by mouth daily.   sildenafil 50 MG tablet Commonly known as: VIAGRA Take 50 mg by mouth daily as needed for  erectile dysfunction.   tamsulosin 0.4 MG Caps capsule Commonly known as: FLOMAX TAKE 1 CAPSULE (0.4 MG TOTAL) BY MOUTH DAILY. What changed: how much to take   venlafaxine XR 75 MG 24 hr capsule Commonly known as: EFFEXOR-XR TAKE 1 CAPSULE BY MOUTH DAILY.        Follow-up Information     Sonia Side., FNP. Schedule an appointment as soon as possible for a visit.   Specialty: Family Medicine Contact information: [place] [address] [phone]                 Discharged Condition: good  39 minutes of time have been dedicated to discharge assessment, planning and discharge instructions.   Signed:  Georgann Housekeeper, AGACNP-BC [place] [facility]  See Amion for personal pager PCCM on call pager [phone] until 7pm. Please call Elink 7p-7a. [phone]  04/24/2021 4:33 PM

## 2021-04-24 NOTE — Progress Notes (Signed)
D/C instructions given and reviewed. IV's removed, tolerated well. Wants to shower before going home, wife at bedside to assist.

## 2021-04-24 NOTE — TOC Progression Note (Signed)
Transition of Care [facility]) - Progression Note    Patient Details  Name: Levi Alvarez MRN: [id number] Date of Birth: 1970/11/15  [facility] CM/SW [place], [place] Phone Number: 04/24/2021, 10:00 AM  Clinical Narrative:     CSW was consulted for ETOH use with this pt. CSW spoke with him at bedside and he advised CSW that he came into the hospital due to blood pressure issues and they are resolving. He states that he used to have an issue with alcohol use, but does not anymore. He also stated he definitely won't have anymore issues with it "after this", meaning his illness. He denies drug use and states he will think about if he needs resources, CSW asked if she could leave him some information and he agreed.        Expected Discharge Plan and Services                                                 Social Determinants of Health (SDOH) Interventions    Readmission Risk Interventions No flowsheet data found.

## 2021-04-24 NOTE — Evaluation (Signed)
Physical Therapy Evaluation Patient Details Name: Levi Alvarez MRN: [id number] DOB: 1971-09-04 Today's Date: 04/24/2021   History of Present Illness  50 y.o. male presents to [facility] ED on 04/19/2021 with reports of weakness. Pt with recent admission for pre-syncopal episodes and reported hypotension after attempting to reduce alcohol consumption. SBP in 60s in the ED this admission. Pt required transfer to ICU on 04/22/2021 due to hypotension and agitation. PMH includes ETOH dependence; alcoholic cirrhosis with varices; COPD; HTN; and OSA.  Clinical Impression  Pt presents to PT with no significant deficits in mobility at this time. Pt is able to ambulate for limited community distances without assistance or use of an assistive device. Pt experiences no LOB when mobilizing at this time. Pt expresses no concerns about mobility at this time. PT provides education on limiting exertion and time spent in the heat, as well as to remain hydrated upon discharge. Acute PT signing off.    Follow Up Recommendations No PT follow up    Equipment Recommendations  None recommended by PT    Recommendations for Other Services       Precautions / Restrictions Precautions Precautions: None Restrictions Weight Bearing Restrictions: No      Mobility  Bed Mobility               General bed mobility comments: not assessed, pt received and left sitting in recliner    Transfers Overall transfer level: Independent Equipment used: None                Ambulation/Gait Ambulation/Gait assistance: Independent Gait Distance (Feet): 250 Feet Assistive device: None Gait Pattern/deviations: WFL(Within Functional Limits) Gait velocity: functional Gait velocity interpretation: >2.62 ft/sec, indicative of community ambulatory General Gait Details: pt with steady step-through gait.  Stairs            Wheelchair Mobility    Modified Rankin (Stroke Patients Only)       Balance Overall  balance assessment: Independent                                           Pertinent Vitals/Pain Pain Assessment: No/denies pain    Home Living Family/patient expects to be discharged to:: Private residence Living Arrangements: Spouse/significant other Available Help at Discharge: Family;Available PRN/intermittently Type of Home: House Home Access: Ramped entrance     Home Layout: One level Home Equipment: Walker - 2 wheels;Cane - single point      Prior Function Level of Independence: Independent               Hand Dominance        Extremity/Trunk Assessment   Upper Extremity Assessment Upper Extremity Assessment: Overall WFL for tasks assessed    Lower Extremity Assessment Lower Extremity Assessment: Overall WFL for tasks assessed    Cervical / Trunk Assessment Cervical / Trunk Assessment: Normal  Communication   Communication: No difficulties  Cognition Arousal/Alertness: Awake/alert Behavior During Therapy: WFL for tasks assessed/performed Overall Cognitive Status: Within Functional Limits for tasks assessed                                        General Comments General comments (skin integrity, edema, etc.): VSS on RA    Exercises     Assessment/Plan    PT Assessment  Patent does not need any further PT services  PT Problem List         PT Treatment Interventions      PT Goals (Current goals can be found in the Care Plan section)       Frequency     Barriers to discharge        Co-evaluation               AM-PAC PT "6 Clicks" Mobility  Outcome Measure Help needed turning from your back to your side while in a flat bed without using bedrails?: None Help needed moving from lying on your back to sitting on the side of a flat bed without using bedrails?: None Help needed moving to and from a bed to a chair (including a wheelchair)?: None Help needed standing up from a chair using your arms  (e.g., wheelchair or bedside chair)?: None Help needed to walk in hospital room?: None Help needed climbing 3-5 steps with a railing? : None 6 Click Score: 24    End of Session   Activity Tolerance: Patient tolerated treatment well Patient left: in chair;with call bell/phone within reach Nurse Communication: Mobility status      Time: 2767-0110 PT Time Calculation (min) (ACUTE ONLY): 15 min   Charges:   PT Evaluation $PT Eval Low Complexity: [place], PT, DPT Acute Rehabilitation Pager: [place] 04/24/2021, 1:47 PM

## 2021-05-08 ENCOUNTER — Other Ambulatory Visit: Payer: Self-pay

## 2021-05-08 ENCOUNTER — Ambulatory Visit (HOSPITAL_COMMUNITY)
Admission: RE | Admit: 2021-05-08 | Discharge: 2021-05-08 | Disposition: A | Discharge status: Home / Self Care | Payer: Medicare Other | Source: Ambulatory Visit | Attending: Gastroenterology | Admitting: Gastroenterology

## 2021-05-08 DIAGNOSIS — K7460 Unspecified cirrhosis of liver: Secondary | ICD-10-CM | POA: Diagnosis present

## 2021-06-30 ENCOUNTER — Other Ambulatory Visit: Payer: Self-pay | Admitting: Family

## 2021-06-30 DIAGNOSIS — I809 Phlebitis and thrombophlebitis of unspecified site: Secondary | ICD-10-CM

## 2021-06-30 DIAGNOSIS — M79602 Pain in left arm: Secondary | ICD-10-CM

## 2021-07-05 ENCOUNTER — Ambulatory Visit (INDEPENDENT_AMBULATORY_CARE_PROVIDER_SITE_OTHER): Payer: Medicare Other | Admitting: Gastroenterology

## 2021-07-05 ENCOUNTER — Encounter: Payer: Self-pay | Admitting: Gastroenterology

## 2021-07-05 ENCOUNTER — Other Ambulatory Visit (INDEPENDENT_AMBULATORY_CARE_PROVIDER_SITE_OTHER): Payer: Medicare Other

## 2021-07-05 VITALS — BP 98/60 | HR 63 | Ht 70.0 in | Wt 235.0 lb

## 2021-07-05 DIAGNOSIS — I8500 Esophageal varices without bleeding: Secondary | ICD-10-CM

## 2021-07-05 DIAGNOSIS — F1010 Alcohol abuse, uncomplicated: Secondary | ICD-10-CM

## 2021-07-05 DIAGNOSIS — I8510 Secondary esophageal varices without bleeding: Secondary | ICD-10-CM

## 2021-07-05 DIAGNOSIS — K7460 Unspecified cirrhosis of liver: Secondary | ICD-10-CM

## 2021-07-05 LAB — PROTIME-INR
INR: 1.1 ratio — ABNORMAL HIGH (ref 0.8–1.0)
Prothrombin Time: 11.8 s (ref 9.6–13.1)

## 2021-07-05 NOTE — Progress Notes (Signed)
HPI :  50 year old male here for follow-up visit for cirrhosis.   History of cirrhosis related to alcohol use and history of hepatitis C. Previously been treated for hepatitis C with undetectable viral load.  His course has been complicated by bleeding esophageal varices in January 2020 when he had a relapse of alcohol use.  He was banded at that time, and then banded again during a follow up EGD March 2020. Subsequent endoscopies have not shown any recurrence, last done 01/2021  Unfortunately since his last visit with me he had a relapse of significant alcohol use.  He states he felt poorly with drinking that much alcohol and noted he needed to stop.  He tried stopping on his own but unfortunately was admitted to the hospital in June for alcohol withdrawal symptoms, he had a temporary stay in the ICU for this but recovered.  He states he has not had any recurrent alcohol use since that time.  He has had significant life stressors which led him to drinking alcohol.  His mother was recently diagnosed with dementia and now he has provide some care for her so he seems quite motivated to stay off alcohol.  We discussed rehabilitation programs and is not interested in that right now.  Is otherwise doing pretty well.  Denies any lower extremity edema or ascites.  He does have some very mild short-term memory lapses but nothing significant from his report.  No overt encephalopathy.  He is on Coreg for his history of varices and tolerating it well.  He had an ultrasound of his liver in June which did not show any new or concerning changes.  Otherwise no complaints today    He has been vaccinated to hep A and B and completed the series in 2019.    Prior workup: EGD 01/06/2018 - 2cm HH, no varices, portal hypertensive gastriits EGD 11/25/18 - large esophageal varices, one with red whale sign - banded x 6, severe portal hypertensive gastritis EGD 12/16/18 - grade II varices in the lower esophagus, procedure was  aborted due to large volume of food in the stomach, no banding done   CT scan 12/11/2008 - cirrhotic appearing liver, ? Wall thickening of the ascending and proximal descending colon, small left inguinal and umbilical hernias   EGD 123456 - Esophagogastric landmarks identified. - Grade II esophageal varix - overall improved since last exam. Banded x 3. - Mild portal hypertensive gastritis. - Normal duodenal bulb and second portion of the duodenum.   Colonoscopy - 01/19/19 - One 8 mm polyp in the cecum, removed with a cold snare. Resected and retrieved. - Two 5 to 6 mm polyps in the transverse colon, removed with a cold snare. Resected and retrieved. - One 5 mm polyp in the descending colon, removed with a cold snare. Resected and retrieved. - One 5 mm polyp in the sigmoid colon, removed with a cold snare. Resected and retrieved. - Mulitple suspected rectal hyperplastic polyps - two representative lesions removed with a cold snare. Resected and retrieved. - One 8 mm polyp in the rectum, removed with a hot snare. Resected and retrieved. - Diverticulosis in the transverse colon and in the ascending colon. - Tortous colon - Edema throughout, consistent with change from portal hypertension, I suspect the cause of CT changes. - Internal hemorrhoids. - The examination was otherwise normal.   EGD 04/28/19 - Esophagogastric landmarks identified. - Small esophageal varices that easily flattened with insufflation and stigmata of prior banding noted. No further  banding performed today. - Normal stomach. - Normal duodenal bulb and second portion of the duodenum.   EGD 11/03/19 - Dr. Loletha Carrow. Grade I varices were found in the lower third of the esophagus. No banding required. Portal hypertensive gastropathy was found in the entire examined stomach. A large amount of food (residue) was found in the entire examined stomach. This limited visualization. Retroflexion performed in stomach, but gastric  cardia/fundus not well visualized as a result of retained Food. The examined duodenum was normal.   RUQ [place] 10/27/20 - IMPRESSION: Cirrhotic appearing liver without definite mass. Remainder of exam unremarkable   EGD 01/24/21 -  Trace varices were found in the lower third of the esophagus. They were small in size. Not amenable to banding. There was a protuberance of benign appearing mucosa at the distal esophagus I suspect scarring from prior banding The exam of the esophagus was otherwise normal. Mild portal hypertensive gastropathy was found in the gastric fundus and in the gastric body. The exam of the stomach was otherwise normal. The duodenal bulb and second portion of the duodenum were normal.  Echo 04/10/21 - EF 60-65%  RUQ [place] 05/08/21 - cirrhosis without focal hepatic lesion   Past Medical History:  Diagnosis Date   Alcoholism ([place])    Blood transfusion without reported diagnosis    jan, 2020 3 units PRBC   Cirrhosis ([place])    secondary to alcohol and hep C   Constipation    Resolved   COPD (chronic obstructive pulmonary disease) (HCC)    DDD (degenerative disc disease), cervical    ED (erectile dysfunction)    Esophageal varices (H[facility])    GERD (gastroesophageal reflux disease)    H/O: upper GI bleed    Hepatitis C antibody positive in blood    resolved   High blood pressure    History of anemia    Left inguinal hernia 2020   Lung nodule    Small   Sleep apnea    trying to get used to his CPAP, not wearing regularly   Umbilical hernia [id number]     Past Surgical History:  Procedure Laterality Date   arm surgery     left arm,   COLONOSCOPY WITH PROPOFOL N/A 01/19/2019   Procedure: COLONOSCOPY WITH PROPOFOL;  Surgeon: Yetta Flock, MD;  Location: [facility] END[facility]COPY;  Service: Gastroenterology;  Laterality: N/A;   ESOPHAGEAL BANDING  11/25/2018   Procedure: ESOPHAGEAL BANDING;  Surgeon: Milus Banister, MD;  Location: [facility] END[facility];  Service: Endoscopy;;    ESOPHAGEAL BANDING  01/19/2019   Procedure: ESOPHAGEAL BANDING;  Surgeon: Yetta Flock, MD;  Location: [facility];  Service: Gastroenterology;;   ESOPHAGEAL BANDING N/A 01/24/2021   Procedure: ESOPHAGEAL BANDING;  Surgeon: Yetta Flock, MD;  Location: [facility];  Service: Gastroenterology;  Laterality: N/A;   ESOPHAGOGASTRODUODENOSCOPY (EGD) WITH PROPOFOL N/A 11/25/2018   Procedure: ESOPHAGOGASTRODUODENOSCOPY (EGD) WITH PROPOFOL;  Surgeon: Milus Banister, MD;  Location: [facility];  Service: Endoscopy;  Laterality: N/A;   ESOPHAGOGASTRODUODENOSCOPY (EGD) WITH PROPOFOL N/A 01/19/2019   Procedure: ESOPHAGOGASTRODUODENOSCOPY (EGD) WITH PROPOFOL;  Surgeon: Yetta Flock, MD;  Location: [facility];  Service: Gastroenterology;  Laterality: N/A;   ESOPHAGOGASTRODUODENOSCOPY (EGD) WITH PROPOFOL N/A 04/28/2019   Procedure: ESOPHAGOGASTRODUODENOSCOPY (EGD) WITH PROPOFOL;  Surgeon: Yetta Flock, MD;  Location: [facility];  Service: Gastroenterology;  Laterality: N/A;   ESOPHAGOGASTRODUODENOSCOPY (EGD) WITH PROPOFOL N/A 11/03/2019   Procedure: ESOPHAGOGASTRODUODENOSCOPY (EGD) WITH PROPOFOL;  Surgeon: Doran Stabler, MD;  Location: [facility];  Service: Gastroenterology;  Laterality: N/A;   ESOPHAGOGASTRODUODENOSCOPY (EGD) WITH PROPOFOL N/A 01/24/2021   Procedure: ESOPHAGOGASTRODUODENOSCOPY (EGD) WITH PROPOFOL;  Surgeon: Yetta Flock, MD;  Location: [facility];  Service: Gastroenterology;  Laterality: N/A;   KNEE SURGERY Left    POLYPECTOMY  01/19/2019   Procedure: POLYPECTOMY;  Surgeon: Yetta Flock, MD;  Location: [facility];  Service: Gastroenterology;;   Family History  Problem Relation Age of Onset   Colitis Mother    Arthritis Mother    Cancer Father        lymphoma?   Cancer Paternal Grandfather        bone   Heart disease Neg Hx    Stroke Neg Hx    Diabetes Neg Hx    Social History   Tobacco Use   Smoking status: Every Day     Packs/day: 0.50    Years: 30.00    Pack years: 15.00    Types: Cigarettes   Smokeless tobacco: Former    Types: Nurse, children's Use: Never used  Substance Use Topics   Alcohol use: Not Currently    Comment: quit in May 2022   Drug use: Yes    Types: Marijuana    Comment: daily use   Current Outpatient Medications  Medication Sig Dispense Refill   acetaminophen (TYLENOL) 500 MG tablet Take 1,000 mg by mouth every 6 (six) hours as needed (pain.).     albuterol (PROVENTIL) (2.5 MG/3ML) 0.083% nebulizer solution Take 3 mLs (2.5 mg total) by nebulization every 6 (six) hours as needed for wheezing or shortness of breath. 150 mL prn   azelastine (ASTELIN) 0.1 % nasal spray Place 1 spray into both nostrils daily.     carvedilol (COREG) 6.25 MG tablet Take 6.25 mg by mouth 2 (two) times daily.     fluticasone furoate-vilanterol (BREO ELLIPTA) 200-25 MCG/INH AEPB Inhale 1 puff into the lungs daily. 180 each 1   loratadine (CLARITIN) 10 MG tablet Take 10 mg by mouth daily.     losartan (COZAAR) 100 MG tablet Take 100 mg by mouth daily.     Milk Thistle 1000 MG CAPS Take 1,000 mg by mouth 2 (two) times daily.     montelukast (SINGULAIR) 10 MG tablet TAKE 1 TABLET (10 MG) BY ORAL ROUTE ONCE DAILY IN THE EVENING FOR ALLERGIC RHINITIS FOR 90 DAYS 90 tablet 3   Multiple Vitamin (MULTIVITAMIN WITH MINERALS) TABS tablet Take 1 tablet by mouth daily.     pantoprazole (PROTONIX) 40 MG tablet Take 1 tablet (40 mg total) by mouth daily.     sildenafil (VIAGRA) 50 MG tablet Take 50 mg by mouth daily as needed for erectile dysfunction.     tamsulosin (FLOMAX) 0.4 MG CAPS capsule TAKE 1 CAPSULE (0.4 MG TOTAL) BY MOUTH DAILY. 90 capsule 1   venlafaxine XR (EFFEXOR-XR) 75 MG 24 hr capsule TAKE 1 CAPSULE BY MOUTH DAILY. 30 capsule 0   VENTOLIN HFA 108 (90 Base) MCG/ACT inhaler INHALE 1 PUFF EVERY 6 (SIX) HOURS AS NEEDED INTO THE LUNGS FOR WHEEZING OR SHORTNESS OF BREATH. 18 g 1   No current  facility-administered medications for this visit.   Allergies  Allergen Reactions   Gabapentin     Other reaction(s): restless leg   Naproxen Hives and Swelling    Other reaction(s): hives     Review of Systems: All systems reviewed and negative except where noted in HPI.   Lab Results  Component Value  Date   WBC 5.4 04/24/2021   HGB 11.3 (L) 04/24/2021   HCT 33.3 (L) 04/24/2021   MCV 92.0 04/24/2021   PLT 78 (L) 04/24/2021    Lab Results  Component Value Date   CREATININE 0.75 04/24/2021   BUN 8 04/24/2021   NA 134 (L) 04/24/2021   K 3.5 04/24/2021   CL 101 04/24/2021   CO2 25 04/24/2021     Lab Results  Component Value Date   ALT 42 04/22/2021   AST 58 (H) 04/22/2021   ALKPHOS 56 04/22/2021   BILITOT 1.0 04/22/2021    Lab Results  Component Value Date   INR 1.2 (H) 12/27/2020   INR 1.1 (H) 09/25/2019   INR 1.25 11/26/2018    Physical Exam: BP 98/60   Pulse 63   Ht '5\' 10"'$  (1.778 m)   Wt 235 lb (106.6 kg)   BMI 33.72 kg/m  Constitutional: Pleasant,well-developed male in no acute distress. Abdominal: Soft, nondistended, protuberant, periumbilical hernia  There are no masses palpable.  Extremities: no edema Neurological: Alert and oriented to person place and time. Skin: Skin is warm and dry. No rashes noted. Psychiatric: Normal mood and affect. Behavior is normal.   ASSESSMENT AND PLAN: 50 year old male here for reassessment of following:  Cirrhosis Esophageal varices Alcohol use  Cirrhosis mostly compensated at this point time.  He has had a history of variceal bleeding but last endoscopy looked okay and he has been maintained on Coreg.  We will repeat an endoscopy in 1 year from his last exam.  Most concerning is his recent alcohol relapse which led to hospitalization when he tried to quit again himself.  He seems quite motivated to stay off alcohol at this time as he now has to provide some care for his mother due to her dementia.  We talked a  bit about this and he does not think he needs any assistance with alcohol rehab etc. at this time.  If this changes he should contact me and I can help set him up for rehabilitation.  Otherwise he is due for surveillance ultrasound in December, EGD next March, due for AFP and INR now.  Labs otherwise up-to-date  Plan: - alcohol abstinence - AFP and INR - recall [place] in December  - EGD next March - f/u in 6 months  Jolly Mango, MD [facility]

## 2021-07-05 NOTE — Patient Instructions (Addendum)
If you are age 49 or older, your body mass index should be between 23-30. Your Body mass index is 33.72 kg/m. If this is out of the aforementioned range listed, please consider follow up with your Primary Care Provider.  If you are age 35 or younger, your body mass index should be between 19-25. Your Body mass index is 33.72 kg/m. If this is out of the aformentioned range listed, please consider follow up with your Primary Care Provider.   __________________________________________________________  The [place] [facility] providers would like to encourage you to use [facility] to communicate with providers for non-urgent requests or questions.  Due to long hold times on the telephone, sending your provider a message by [facility] may be a faster and more efficient way to get a response.  Please allow 48 business hours for a response.  Please remember that this is for non-urgent requests.   Please go to the lab in the basement of our building to have lab work done as you leave today. Hit "B" for basement when you get on the elevator.  When the doors open the lab is on your left.  We will call you with the results. Thank you.   You will be due for Ultrasound in December.  You will be contacted by schedulers to make an appointment at [facility].  You will be due for a recall Endoscopy in 01-2022. We will send you a reminder in the mail when it gets closer to that time.   Thank you for entrusting me with your care and for choosing [facility], Dr. Satilla Cellar

## 2021-07-06 LAB — AFP TUMOR MARKER: AFP-Tumor Marker: 2.7 ng/mL (ref <6.1)

## 2021-07-10 ENCOUNTER — Telehealth: Payer: Self-pay

## 2021-07-10 NOTE — Telephone Encounter (Signed)
Called and spoke to [place].  They will call the patient and reschedule his RUQ ultrasound to December (currently scheduled in Sept).

## 2021-07-10 NOTE — Telephone Encounter (Signed)
-----   Message from April H Pait sent at 07/06/2021 11:12 AM EDT ----- Regarding: RE: U/S liver in December Pt was sched at [facility] for September.  Marcie Bal, please call them to have him rs to Dec. ----- Message ----- From: Roetta Sessions, CMA Sent: 07/05/2021  11:07 AM EDT To: April H Pait, Roosvelt Maser Subject: U/S liver in December                          Please call patient to schedule RUQ ultrasound in [facility] for [facility] screening.  Thank you  Randel Books, CMA [facility]

## 2021-07-13 ENCOUNTER — Ambulatory Visit
Admission: RE | Admit: 2021-07-13 | Discharge: 2021-07-13 | Disposition: A | Discharge status: Home / Self Care | Payer: Medicare Other | Source: Ambulatory Visit | Attending: Family | Admitting: Family

## 2021-07-13 ENCOUNTER — Other Ambulatory Visit: Payer: Medicare Other

## 2021-07-13 DIAGNOSIS — I809 Phlebitis and thrombophlebitis of unspecified site: Secondary | ICD-10-CM

## 2021-07-13 DIAGNOSIS — M79602 Pain in left arm: Secondary | ICD-10-CM

## 2021-08-12 ENCOUNTER — Other Ambulatory Visit: Payer: Self-pay

## 2021-08-12 ENCOUNTER — Emergency Department (HOSPITAL_COMMUNITY)
Admission: EM | Admit: 2021-08-12 | Discharge: 2021-08-13 | Disposition: A | Discharge status: Home / Self Care | Payer: Medicare Other | Attending: Emergency Medicine | Admitting: Emergency Medicine

## 2021-08-12 DIAGNOSIS — Z79899 Other long term (current) drug therapy: Secondary | ICD-10-CM | POA: Diagnosis not present

## 2021-08-12 DIAGNOSIS — J449 Chronic obstructive pulmonary disease, unspecified: Secondary | ICD-10-CM | POA: Insufficient documentation

## 2021-08-12 DIAGNOSIS — I10 Essential (primary) hypertension: Secondary | ICD-10-CM | POA: Insufficient documentation

## 2021-08-12 DIAGNOSIS — F17210 Nicotine dependence, cigarettes, uncomplicated: Secondary | ICD-10-CM | POA: Diagnosis not present

## 2021-08-12 DIAGNOSIS — F1290 Cannabis use, unspecified, uncomplicated: Secondary | ICD-10-CM | POA: Insufficient documentation

## 2021-08-12 DIAGNOSIS — F1910 Other psychoactive substance abuse, uncomplicated: Secondary | ICD-10-CM | POA: Diagnosis not present

## 2021-08-12 DIAGNOSIS — Y904 Blood alcohol level of 80-99 mg/100 ml: Secondary | ICD-10-CM | POA: Diagnosis not present

## 2021-08-12 DIAGNOSIS — Z7951 Long term (current) use of inhaled steroids: Secondary | ICD-10-CM | POA: Diagnosis not present

## 2021-08-12 DIAGNOSIS — F1490 Cocaine use, unspecified, uncomplicated: Secondary | ICD-10-CM | POA: Diagnosis not present

## 2021-08-12 DIAGNOSIS — I959 Hypotension, unspecified: Secondary | ICD-10-CM | POA: Diagnosis not present

## 2021-08-12 DIAGNOSIS — R42 Dizziness and giddiness: Secondary | ICD-10-CM | POA: Diagnosis present

## 2021-08-12 MED ORDER — SODIUM CHLORIDE 0.9 % IV BOLUS
1000.0000 mL | Freq: Once | INTRAVENOUS | Status: AC
  Administered 2021-08-12: 1000 mL via INTRAVENOUS

## 2021-08-12 NOTE — ED Notes (Signed)
Provider at bedside

## 2021-08-12 NOTE — ED Provider Notes (Signed)
[facility] EMERGENCY DEPARTMENT Provider Note   CSN: [id number] Arrival date & time: 08/12/21  2232     History Chief Complaint  Patient presents with   Hypotension    Levi Alvarez is a 50 y.o. male.  50 y/o male with hx of HTN, cirrhosis, alcohol dependence, esophageal varices s/p 6 ligating bands (Dr. Havery Moros), DDD, COPD, OSA presents to the ED for evaluation of lightheadedness and near syncope.  Patient reports that he stood up and felt lightheaded with some spots in his vision.  His girlfriend reported to the patient that he appeared pale.  She took his blood pressure at home and it was in the 49F systolic.  He did not experience any syncopal event.  Initially reporting no SOB, now stating he "may have had a little" that has now resolved.  No associated nausea, vomiting, chest pain, complete vision loss, abdominal pain, new/worsening back pain.  EMS gave 1000cc NS en route to the ED.  Patient did take his prescribed Carvedilol around lunch time today.  Missed his medication dosing yesterday.  Reports good appetite and fluid intake throughout the day as well.  Did drink 4 x 24 ounce beers today. Denies hx of seizures from ETOH withdrawal; does become tremulous.  The history is provided by the patient. No language interpreter was used.      Past Medical History:  Diagnosis Date   Alcoholism ([place])    Blood transfusion without reported diagnosis    jan, 2020 3 units PRBC   Cirrhosis ([place])    secondary to alcohol and hep C   Constipation    Resolved   COPD (chronic obstructive pulmonary disease) (HCC)    DDD (degenerative disc disease), cervical    ED (erectile dysfunction)    Esophageal varices (HCC)    GERD (gastroesophageal reflux disease)    H/O: upper GI bleed    Hepatitis C antibody positive in blood    resolved   High blood pressure    History of anemia    Left inguinal hernia 2020   Lung nodule    Small   Sleep apnea    trying to get used to  his CPAP, not wearing regularly   Umbilical hernia 0263    Patient Active Problem List   Diagnosis Date Noted   Alcohol withdrawal syndrome without complication ([place])    Alcohol dependence with withdrawal with complication ([place]) 78/58/8502   Marijuana abuse, continuous 04/20/2021   Pancreatitis, acute 04/10/2021   Hypotension 04/10/2021   Chest pain 04/10/2021   Esophageal varices without bleeding (HCC)    Abnormal CT scan, colon    Benign neoplasm of colon    Bleeding esophageal varices ([place])    Spondylosis without myelopathy or radiculopathy, cervical region 12/03/2018   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   Post-traumatic osteoarthritis of left knee 12/03/2018   Chronic obstructive pulmonary disease ([place]) 12/02/2018   Upper GI bleed 11/24/2018   Acute blood loss anemia 11/24/2018   Alcohol abuse 11/24/2018   Chronic back pain 11/24/2018   Chronic hepatitis C without hepatic coma ([place]) 10/08/2017   Cirrhosis ([place]) 10/08/2017   Chronic pain of left knee 07/23/2017   Essential hypertension 07/12/2017   OSA (obstructive sleep apnea) 07/12/2017   Class 2 obesity due to excess calories with body mass index (BMI) of 35.0 to 35.9 in adult 07/12/2017   Elevated liver enzymes 07/12/2017   Other chronic pain 07/12/2017   Smoker 07/12/2017   Hyperlipidemia 07/12/2017   Polyarthralgia 07/12/2017  Snoring 06/05/2017    Past Surgical History:  Procedure Laterality Date   arm surgery     left arm,   COLONOSCOPY WITH PROPOFOL N/A 01/19/2019   Procedure: COLONOSCOPY WITH PROPOFOL;  Surgeon: Yetta Flock, MD;  Location: [facility];  Service: Gastroenterology;  Laterality: N/A;   ESOPHAGEAL BANDING  11/25/2018   Procedure: ESOPHAGEAL BANDING;  Surgeon: Milus Banister, MD;  Location: [facility];  Service: Endoscopy;;   ESOPHAGEAL BANDING  01/19/2019   Procedure: ESOPHAGEAL BANDING;  Surgeon: Yetta Flock, MD;  Location: [facility];  Service: Gastroenterology;;    ESOPHAGEAL BANDING N/A 01/24/2021   Procedure: ESOPHAGEAL BANDING;  Surgeon: Yetta Flock, MD;  Location: [facility];  Service: Gastroenterology;  Laterality: N/A;   ESOPHAGOGASTRODUODENOSCOPY (EGD) WITH PROPOFOL N/A 11/25/2018   Procedure: ESOPHAGOGASTRODUODENOSCOPY (EGD) WITH PROPOFOL;  Surgeon: Milus Banister, MD;  Location: [facility];  Service: Endoscopy;  Laterality: N/A;   ESOPHAGOGASTRODUODENOSCOPY (EGD) WITH PROPOFOL N/A 01/19/2019   Procedure: ESOPHAGOGASTRODUODENOSCOPY (EGD) WITH PROPOFOL;  Surgeon: Yetta Flock, MD;  Location: [facility];  Service: Gastroenterology;  Laterality: N/A;   ESOPHAGOGASTRODUODENOSCOPY (EGD) WITH PROPOFOL N/A 04/28/2019   Procedure: ESOPHAGOGASTRODUODENOSCOPY (EGD) WITH PROPOFOL;  Surgeon: Yetta Flock, MD;  Location: [facility];  Service: Gastroenterology;  Laterality: N/A;   ESOPHAGOGASTRODUODENOSCOPY (EGD) WITH PROPOFOL N/A 11/03/2019   Procedure: ESOPHAGOGASTRODUODENOSCOPY (EGD) WITH PROPOFOL;  Surgeon: Doran Stabler, MD;  Location: [facility];  Service: Gastroenterology;  Laterality: N/A;   ESOPHAGOGASTRODUODENOSCOPY (EGD) WITH PROPOFOL N/A 01/24/2021   Procedure: ESOPHAGOGASTRODUODENOSCOPY (EGD) WITH PROPOFOL;  Surgeon: Yetta Flock, MD;  Location: [facility];  Service: Gastroenterology;  Laterality: N/A;   KNEE SURGERY Left    POLYPECTOMY  01/19/2019   Procedure: POLYPECTOMY;  Surgeon: Yetta Flock, MD;  Location: [facility];  Service: Gastroenterology;;       Family History  Problem Relation Age of Onset   Colitis Mother    Arthritis Mother    Cancer Father        lymphoma?   Cancer Paternal Grandfather        bone   Heart disease Neg Hx    Stroke Neg Hx    Diabetes Neg Hx     Social History   Tobacco Use   Smoking status: Every Day    Packs/day: 0.50    Years: 30.00    Pack years: 15.00    Types: Cigarettes   Smokeless tobacco: Former    Types: Nurse, children's Use:  Never used  Substance Use Topics   Alcohol use: Not Currently    Comment: quit in May 2022   Drug use: Yes    Types: Marijuana    Comment: daily use    Home Medications Prior to Admission medications   Medication Sig Start Date End Date Taking? Authorizing Provider  acetaminophen (TYLENOL) 500 MG tablet Take 1,000 mg by mouth every 6 (six) hours as needed (pain.).   Yes [provider]  albuterol (PROVENTIL) (2.5 MG/3ML) 0.083% nebulizer solution Take 3 mLs (2.5 mg total) by nebulization every 6 (six) hours as needed for wheezing or shortness of breath. 12/02/18  Yes Gildardo Pounds, NP  azelastine (ASTELIN) 0.1 % nasal spray Place 1 spray into both nostrils daily as needed for allergies. 03/21/21  Yes [provider]  carvedilol (COREG) 6.25 MG tablet Take 6.25 mg by mouth 2 (two) times daily. 02/01/21  Yes [provider]  finasteride (PROSCAR) 5 MG tablet Take  5 mg by mouth daily. 06/30/21  Yes [provider]  fluticasone furoate-vilanterol (BREO ELLIPTA) 200-25 MCG/INH AEPB Inhale 1 puff into the lungs daily. 09/25/19  Yes Gildardo Pounds, NP  loratadine (CLARITIN) 10 MG tablet Take 10 mg by mouth daily.   Yes [provider]  losartan (COZAAR) 100 MG tablet Take 100 mg by mouth daily. 12/24/20  Yes [provider]  Milk Thistle 1000 MG CAPS Take 1,000 mg by mouth 2 (two) times daily.   Yes [provider]  montelukast (SINGULAIR) 10 MG tablet TAKE 1 TABLET (10 MG) BY ORAL ROUTE ONCE DAILY IN THE EVENING FOR ALLERGIC RHINITIS FOR 90 DAYS Patient taking differently: Take 10 mg by mouth every evening. 09/15/20 09/15/21 Yes Sonia Side., FNP  Multiple Vitamin (MULTIVITAMIN WITH MINERALS) TABS tablet Take 1 tablet by mouth daily.   Yes [provider]  pantoprazole (PROTONIX) 40 MG tablet Take 1 tablet (40 mg total) by mouth daily. 04/24/21  Yes Corey Harold, NP  sildenafil (VIAGRA) 50 MG tablet Take 50 mg by mouth  daily as needed for erectile dysfunction.   Yes [provider]  tamsulosin (FLOMAX) 0.4 MG CAPS capsule TAKE 1 CAPSULE (0.4 MG TOTAL) BY MOUTH DAILY. Patient taking differently: Take 0.4 mg by mouth daily. 08/17/20 08/17/21 Yes Gildardo Pounds, NP  venlafaxine XR (EFFEXOR-XR) 75 MG 24 hr capsule TAKE 1 CAPSULE BY MOUTH DAILY. Patient taking differently: Take 75 mg by mouth daily. 08/15/20 08/15/21 Yes Deveron Furlong, NP  VENTOLIN HFA 108 (90 Base) MCG/ACT inhaler INHALE 1 PUFF EVERY 6 (SIX) HOURS AS NEEDED INTO THE LUNGS FOR WHEEZING OR SHORTNESS OF BREATH. Patient taking differently: Inhale 1 puff into the lungs every 6 (six) hours as needed for wheezing or shortness of breath. 05/19/20  Yes Charlott Rakes, MD    Allergies    Onion, Gabapentin, and Naproxen  Review of Systems   Review of Systems Ten systems reviewed and are negative for acute change, except as noted in the HPI.    Physical Exam Updated Vital Signs BP 131/82   Pulse 72   Temp 98 F (36.7 C) (Oral)   Resp 15   Ht 5\' 10"  (1.778 m)   Wt 102.1 kg   SpO2 97%   BMI 32.28 kg/m   Physical Exam Vitals and nursing note reviewed.  Constitutional:      General: He is not in acute distress.    Appearance: He is well-developed. He is not diaphoretic.     Comments: Appears fatigued; nontoxic.  HENT:     Head: Normocephalic and atraumatic.  Eyes:     General: No scleral icterus.    Conjunctiva/sclera: Conjunctivae normal.  Cardiovascular:     Rate and Rhythm: Normal rate and regular rhythm.     Pulses: Normal pulses.     Comments: Palpable distal radial pulse. RRR. Pulmonary:     Effort: Pulmonary effort is normal. No respiratory distress.     Comments: Respirations even and unlabored Abdominal:     Palpations: Abdomen is soft.     Tenderness: There is no guarding.     Hernia: A hernia is present.     Comments: Soft, obese, nontender abdomen. Palpable, nontender umbilical hernia.  Musculoskeletal:         General: Normal range of motion.     Cervical back: Normal range of motion.  Skin:    General: Skin is warm and dry.     Coloration: Skin is  not pale.     Findings: No erythema or rash.  Neurological:     Mental Status: He is alert and oriented to person, place, and time.     Coordination: Coordination normal.  Psychiatric:        Behavior: Behavior normal.    ED Results / Procedures / Treatments   Labs (all labs ordered are listed, but only abnormal results are displayed) Labs Reviewed  CBC WITH DIFFERENTIAL/PLATELET - Abnormal; Notable for the following components:      Result Value   RBC 3.79 (*)    Hemoglobin 11.1 (*)    HCT 33.9 (*)    RDW 16.4 (*)    Monocytes Absolute 1.1 (*)    All other components within normal limits  COMPREHENSIVE METABOLIC PANEL - Abnormal; Notable for the following components:   CO2 21 (*)    Calcium 7.8 (*)    Total Protein 5.8 (*)    Albumin 3.1 (*)    All other components within normal limits  ETHANOL - Abnormal; Notable for the following components:   Alcohol, Ethyl (B) 99 (*)    All other components within normal limits  RAPID URINE DRUG SCREEN, HOSP PERFORMED - Abnormal; Notable for the following components:   Cocaine POSITIVE (*)    Tetrahydrocannabinol POSITIVE (*)    All other components within normal limits  LIPASE, BLOOD    EKG None  Radiology No results found.  Procedures Procedures   Medications Ordered in ED Medications  sodium chloride 0.9 % bolus 1,000 mL (0 mLs Intravenous Stopped 08/12/21 2353)  sodium chloride 0.9 % bolus 1,000 mL (0 mLs Intravenous Stopped 08/13/21 0159)    ED Course  I have reviewed the triage vital signs and the nursing notes.  Pertinent labs & imaging results that were available during my care of the patient were reviewed by me and considered in my medical decision making (see chart for details).  Clinical Course as of 08/13/21 7062  Sun Aug 13, 2021  3762 Cocaine + with ETOH on board.  Question polysubstance abuse contributing to hypotension today. Just recently started receiving additional IVF. [KH]  0039 EKG not crossing over, but was reviewed by MD Horton without acute ischemic changes. [KH]  0216 BP improving after 2L IVF. Orthostatic vital signs normal, reassuring. [KH]    Clinical Course User Index [KH] Beverely Pace   MDM Rules/Calculators/A&P                           50 year old male presenting to the emergency department for an episode of hypotension.  Suspect that his symptoms are related to poor PO intake in the setting of polysubstance abuse.  He has been hydrated with a total of 3 L IV fluid between ED management and EMS.  Blood pressure has now stabilized.  Not orthostatic on bedside testing.  Remainder of his laboratory work-up is at baseline.  EKG is nonischemic.  Patient advised to curtail use of alcohol and to avoid illicit substances.  He requires strict blood pressure control given his history of esophageal varices, though I suspect that these medications may need to be lowered as this is not the first episode of transient hypotension.  Patient has f/u with his PCP this week.  Encouraged return for new or concerning symptoms.  Return precautions discussed and provided.  Patient discharged in stable condition with no unaddressed concerns.   Final Clinical Impression(s) / ED Diagnoses Final diagnoses:  Hypotension, unspecified hypotension type  Polysubstance abuse [facility])    Rx / DC Orders ED Discharge Orders     None        Antonietta Breach, PA-C 08/13/21 5329    Merryl Hacker, MD 08/14/21 [phone]

## 2021-08-12 NOTE — ED Triage Notes (Signed)
Pt BIB [facility] from home d/t hypotension. Per ems, pt had a normal day until he got home took his carvedilol. Pt started feeling dizzy, then family checked his BP, and it was in the 16s. Denied n/v and SOB. Pt has been having intermittent diarrhea for over a month.  Pt had 4 x 24 ounces beers prior the incident.A&O X4.   1000 ml NS given by ems   EMS VS:  Cbg 111  Hr 70  Bp 86/46 RA 96%

## 2021-08-13 DIAGNOSIS — I959 Hypotension, unspecified: Secondary | ICD-10-CM | POA: Diagnosis not present

## 2021-08-13 LAB — CBC WITH DIFFERENTIAL/PLATELET
Abs Immature Granulocytes: 0.03 10*3/uL (ref 0.00–0.07)
Basophils Absolute: 0.1 10*3/uL (ref 0.0–0.1)
Basophils Relative: 1 %
Eosinophils Absolute: 0.2 10*3/uL (ref 0.0–0.5)
Eosinophils Relative: 2 %
HCT: 33.9 % — ABNORMAL LOW (ref 39.0–52.0)
Hemoglobin: 11.1 g/dL — ABNORMAL LOW (ref 13.0–17.0)
Immature Granulocytes: 0 %
Lymphocytes Relative: 35 %
Lymphs Abs: 3.5 10*3/uL (ref 0.7–4.0)
MCH: 29.3 pg (ref 26.0–34.0)
MCHC: 32.7 g/dL (ref 30.0–36.0)
MCV: 89.4 fL (ref 80.0–100.0)
Monocytes Absolute: 1.1 10*3/uL — ABNORMAL HIGH (ref 0.1–1.0)
Monocytes Relative: 11 %
Neutro Abs: 5.1 10*3/uL (ref 1.7–7.7)
Neutrophils Relative %: 51 %
Platelets: 151 10*3/uL (ref 150–400)
RBC: 3.79 MIL/uL — ABNORMAL LOW (ref 4.22–5.81)
RDW: 16.4 % — ABNORMAL HIGH (ref 11.5–15.5)
WBC: 10.1 10*3/uL (ref 4.0–10.5)
nRBC: 0 % (ref 0.0–0.2)

## 2021-08-13 LAB — RAPID URINE DRUG SCREEN, HOSP PERFORMED
Amphetamines: NOT DETECTED
Barbiturates: NOT DETECTED
Benzodiazepines: NOT DETECTED
Cocaine: POSITIVE — AB
Opiates: NOT DETECTED
Tetrahydrocannabinol: POSITIVE — AB

## 2021-08-13 LAB — COMPREHENSIVE METABOLIC PANEL
ALT: 19 U/L (ref 0–44)
AST: 25 U/L (ref 15–41)
Albumin: 3.1 g/dL — ABNORMAL LOW (ref 3.5–5.0)
Alkaline Phosphatase: 82 U/L (ref 38–126)
Anion gap: 10 (ref 5–15)
BUN: 8 mg/dL (ref 6–20)
CO2: 21 mmol/L — ABNORMAL LOW (ref 22–32)
Calcium: 7.8 mg/dL — ABNORMAL LOW (ref 8.9–10.3)
Chloride: 107 mmol/L (ref 98–111)
Creatinine, Ser: 1.16 mg/dL (ref 0.61–1.24)
GFR, Estimated: >60 mL/min (ref >60)
Glucose, Bld: 99 mg/dL (ref 70–99)
Potassium: 3.6 mmol/L (ref 3.5–5.1)
Sodium: 138 mmol/L (ref 135–145)
Total Bilirubin: 0.3 mg/dL (ref 0.3–1.2)
Total Protein: 5.8 g/dL — ABNORMAL LOW (ref 6.5–8.1)

## 2021-08-13 LAB — ETHANOL: Alcohol, Ethyl (B): 99 mg/dL — ABNORMAL HIGH (ref <10)

## 2021-08-13 LAB — LIPASE, BLOOD: Lipase: 40 U/L (ref 11–51)

## 2021-08-13 MED ORDER — SODIUM CHLORIDE 0.9 % IV BOLUS
1000.0000 mL | Freq: Once | INTRAVENOUS | Status: AC
  Administered 2021-08-13: 1000 mL via INTRAVENOUS

## 2021-08-13 NOTE — Discharge Instructions (Signed)
Drink plenty of fluids to prevent dehydration.  It is possible that dehydration coupled with your blood pressure medication has caused your blood pressure to drop too low at times making you lightheaded.  This can be made worse with the use of alcohol and illicit drugs.  We recommend close follow-up with your primary care doctor.  Return for new or concerning symptoms.

## 2021-10-12 ENCOUNTER — Inpatient Hospital Stay: Admission: RE | Admit: 2021-10-12 | Payer: Medicare Other | Source: Ambulatory Visit

## 2021-10-24 ENCOUNTER — Ambulatory Visit
Admission: RE | Admit: 2021-10-24 | Discharge: 2021-10-24 | Disposition: A | Discharge status: Home / Self Care | Payer: Medicare Other | Source: Ambulatory Visit | Attending: Gastroenterology | Admitting: Gastroenterology

## 2021-10-24 DIAGNOSIS — I8500 Esophageal varices without bleeding: Secondary | ICD-10-CM

## 2021-10-24 DIAGNOSIS — K7460 Unspecified cirrhosis of liver: Secondary | ICD-10-CM

## 2021-10-24 DIAGNOSIS — F1010 Alcohol abuse, uncomplicated: Secondary | ICD-10-CM

## 2021-10-26 ENCOUNTER — Other Ambulatory Visit: Payer: Self-pay

## 2021-10-26 DIAGNOSIS — K7460 Unspecified cirrhosis of liver: Secondary | ICD-10-CM

## 2021-11-29 ENCOUNTER — Encounter: Payer: Self-pay | Admitting: Gastroenterology

## 2022-01-09 ENCOUNTER — Other Ambulatory Visit: Payer: Self-pay | Admitting: Neurological Surgery

## 2022-01-09 DIAGNOSIS — M5412 Radiculopathy, cervical region: Secondary | ICD-10-CM

## 2022-01-15 ENCOUNTER — Ambulatory Visit (INDEPENDENT_AMBULATORY_CARE_PROVIDER_SITE_OTHER): Payer: Medicare (Managed Care) | Admitting: Gastroenterology

## 2022-01-15 ENCOUNTER — Encounter: Payer: Self-pay | Admitting: Gastroenterology

## 2022-01-15 ENCOUNTER — Other Ambulatory Visit (INDEPENDENT_AMBULATORY_CARE_PROVIDER_SITE_OTHER): Payer: Medicare (Managed Care)

## 2022-01-15 VITALS — BP 120/78 | HR 73 | Ht 70.0 in | Wt 244.0 lb

## 2022-01-15 DIAGNOSIS — F1020 Alcohol dependence, uncomplicated: Secondary | ICD-10-CM | POA: Diagnosis not present

## 2022-01-15 DIAGNOSIS — I8501 Esophageal varices with bleeding: Secondary | ICD-10-CM

## 2022-01-15 DIAGNOSIS — K7030 Alcoholic cirrhosis of liver without ascites: Secondary | ICD-10-CM

## 2022-01-15 DIAGNOSIS — I8511 Secondary esophageal varices with bleeding: Secondary | ICD-10-CM

## 2022-01-15 LAB — COMPREHENSIVE METABOLIC PANEL
ALT: 31 U/L (ref 0–53)
AST: 38 U/L — ABNORMAL HIGH (ref 0–37)
Albumin: 4.7 g/dL (ref 3.5–5.2)
Alkaline Phosphatase: 56 U/L (ref 39–117)
BUN: 10 mg/dL (ref 6–23)
CO2: 27 mEq/L (ref 19–32)
Calcium: 9.7 mg/dL (ref 8.4–10.5)
Chloride: 99 mEq/L (ref 96–112)
Creatinine, Ser: 0.79 mg/dL (ref 0.40–1.50)
GFR: 103.86 mL/min (ref >60.00)
Glucose, Bld: 80 mg/dL (ref 70–99)
Potassium: 4.4 mEq/L (ref 3.5–5.1)
Sodium: 135 mEq/L (ref 135–145)
Total Bilirubin: 0.6 mg/dL (ref 0.2–1.2)
Total Protein: 7.9 g/dL (ref 6.0–8.3)

## 2022-01-15 LAB — CBC WITH DIFFERENTIAL/PLATELET
Basophils Absolute: 0 10*3/uL (ref 0.0–0.1)
Basophils Relative: 0.5 % (ref 0.0–3.0)
Eosinophils Absolute: 0.2 10*3/uL (ref 0.0–0.7)
Eosinophils Relative: 1.8 % (ref 0.0–5.0)
HCT: 40.3 % (ref 39.0–52.0)
Hemoglobin: 13.4 g/dL (ref 13.0–17.0)
Lymphocytes Relative: 24 % (ref 12.0–46.0)
Lymphs Abs: 2.1 10*3/uL (ref 0.7–4.0)
MCHC: 33.2 g/dL (ref 30.0–36.0)
MCV: 87.3 fl (ref 78.0–100.0)
Monocytes Absolute: 1.1 10*3/uL — ABNORMAL HIGH (ref 0.1–1.0)
Monocytes Relative: 12.9 % — ABNORMAL HIGH (ref 3.0–12.0)
Neutro Abs: 5.3 10*3/uL (ref 1.4–7.7)
Neutrophils Relative %: 60.8 % (ref 43.0–77.0)
Platelets: 162 10*3/uL (ref 150.0–400.0)
RBC: 4.62 Mil/uL (ref 4.22–5.81)
RDW: 17.9 % — ABNORMAL HIGH (ref 11.5–15.5)
WBC: 8.7 10*3/uL (ref 4.0–10.5)

## 2022-01-15 LAB — PROTIME-INR
INR: 1.1 ratio — ABNORMAL HIGH (ref 0.8–1.0)
Prothrombin Time: 11.9 s (ref 9.6–13.1)

## 2022-01-15 NOTE — Progress Notes (Signed)
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  I last saw him in August 2022.  Call he was previously treated for hep C with undetectable viral load.  He has a history of bleeding esophageal varices in January 22 when he relapsed with alcohol, he underwent banding at that time and has had surveillance EGDs and on Coreg since then.  He has had problems with alcohol relapses since have seen him.  Recall he was admitted to the ICU last year for alcohol withdrawal, he had a lot of life stressors and had a hard time staying free from alcohol.  Unfortunately since of last seen him he had another relapse of alcohol, states has been drinking over the past 6 months, anywhere from 12-18 beers per day.  He has a lot of stress in his life, he takes care of of his mother who has dementia, he has a hard time functioning with this.  He otherwise has not had any other decompensations.  No jaundice, no ascites, no bleeding symptoms, no encephalopathy.  He is tolerating the amount of alcohol he is taking fairly well.  His girlfriend is friends with a physician at [facility], he has been plugged in with psychiatry, he is planning to be admitted to the hospital for alcohol detox sometime next week.  He understands how important is to maintain sobriety and he does have alcohol withdrawal symptoms if he stops drinking.    He has been vaccinated to hep A and B and completed the series in 2019.  States he has been compliant with his medications.  He had an ultrasound in December which showed stable changes of cirrhosis.     Prior workup: EGD 01/06/2018 - 2cm HH, no varices, portal hypertensive gastriits EGD 11/25/18 - large esophageal varices, one with red whale sign - banded x 6, severe portal hypertensive gastritis EGD 12/16/18 - grade II varices in the lower esophagus, procedure was aborted due to large volume of food in the stomach, no banding  done   CT scan 12/11/2008 - cirrhotic appearing liver, ? Wall thickening of the ascending and proximal descending colon, small left inguinal and umbilical hernias   EGD 0/9/81 - Esophagogastric landmarks identified. - Grade II esophageal varix - overall improved since last exam. Banded x 3. - Mild portal hypertensive gastritis. - Normal duodenal bulb and second portion of the duodenum.   Colonoscopy - 01/19/19 - One 8 mm polyp in the cecum, removed with a cold snare. Resected and retrieved. - Two 5 to 6 mm polyps in the transverse colon, removed with a cold snare. Resected and retrieved. - One 5 mm polyp in the descending colon, removed with a cold snare. Resected and retrieved. - One 5 mm polyp in the sigmoid colon, removed with a cold snare. Resected and retrieved. - Mulitple suspected rectal hyperplastic polyps - two representative lesions removed with a cold snare. Resected and retrieved. - One 8 mm polyp in the rectum, removed with a hot snare. Resected and retrieved. - Diverticulosis in the transverse colon and in the ascending colon. - Tortous colon - Edema throughout, consistent with change from portal hypertension, I suspect the cause of CT changes. - Internal hemorrhoids. - The examination was otherwise normal.   1. Colon, polyp(s), cecum, transverse - TUBULAR ADENOMA. - SESSILE SERRATED POLYP WITHOUT DYSPLASIA (X5 FRAGMENTS). - NO HIGH GRADE DYSPLASIA OR MALIGNANCY. 2. Colon, polyp(s), sigmoid, rectal, descending -  HYPERPLASTIC POLYP (X5 FRAGMENTS). - NO DYSPLASIA OR MALIGNANCY.  EGD 04/28/19 - Esophagogastric landmarks identified. - Small esophageal varices that easily flattened with insufflation and stigmata of prior banding noted. No further banding performed today. - Normal stomach. - Normal duodenal bulb and second portion of the duodenum.   EGD 11/03/19 - Dr. Loletha Carrow. Grade I varices were found in the lower third of the esophagus. No banding required. Portal  hypertensive gastropathy was found in the entire examined stomach. A large amount of food (residue) was found in the entire examined stomach. This limited visualization. Retroflexion performed in stomach, but gastric cardia/fundus not well visualized as a result of retained Food. The examined duodenum was normal.   RUQ [place] 10/27/20 - IMPRESSION: Cirrhotic appearing liver without definite mass. Remainder of exam unremarkable     EGD 01/24/21 -  Trace varices were found in the lower third of the esophagus. They were small in size. Not amenable to banding. There was a protuberance of benign appearing mucosa at the distal esophagus I suspect scarring from prior banding The exam of the esophagus was otherwise normal. Mild portal hypertensive gastropathy was found in the gastric fundus and in the gastric body. The exam of the stomach was otherwise normal. The duodenal bulb and second portion of the duodenum were normal.   Echo 04/10/21 - EF 60-65%   RUQ [place] 05/08/21 - cirrhosis without focal hepatic lesion    RUQ [place] 10/24/21: IMPRESSION: Liver cirrhosis without evidence for focal hepatic mass.       Past Medical History:  Diagnosis Date   Alcoholism ([place])    Blood transfusion without reported diagnosis    jan, 2020 3 units PRBC   Cirrhosis ([place])    secondary to alcohol and hep C   Constipation    Resolved   COPD (chronic obstructive pulmonary disease) (HCC)    DDD (degenerative disc disease), cervical    ED (erectile dysfunction)    Esophageal varices (HCC)    GERD (gastroesophageal reflux disease)    H/O: upper GI bleed    Hepatitis C antibody positive in blood    resolved   High blood pressure    History of anemia    Left inguinal hernia 2020   Lung nodule    Small   Sleep apnea    trying to get used to his CPAP, not wearing regularly   Umbilical hernia 2440     Past Surgical History:  Procedure Laterality Date   arm surgery     left arm,   COLONOSCOPY WITH  PROPOFOL N/A 01/19/2019   Procedure: COLONOSCOPY WITH PROPOFOL;  Surgeon: Yetta Flock, MD;  Location: [facility];  Service: Gastroenterology;  Laterality: N/A;   ESOPHAGEAL BANDING  11/25/2018   Procedure: ESOPHAGEAL BANDING;  Surgeon: Milus Banister, MD;  Location: [facility];  Service: Endoscopy;;   ESOPHAGEAL BANDING  01/19/2019   Procedure: ESOPHAGEAL BANDING;  Surgeon: Yetta Flock, MD;  Location: [facility];  Service: Gastroenterology;;   ESOPHAGEAL BANDING N/A 01/24/2021   Procedure: ESOPHAGEAL BANDING;  Surgeon: Yetta Flock, MD;  Location: [facility];  Service: Gastroenterology;  Laterality: N/A;   ESOPHAGOGASTRODUODENOSCOPY (EGD) WITH PROPOFOL N/A 11/25/2018   Procedure: ESOPHAGOGASTRODUODENOSCOPY (EGD) WITH PROPOFOL;  Surgeon: Milus Banister, MD;  Location: [facility];  Service: Endoscopy;  Laterality: N/A;   ESOPHAGOGASTRODUODENOSCOPY (EGD) WITH PROPOFOL N/A 01/19/2019   Procedure: ESOPHAGOGASTRODUODENOSCOPY (EGD) WITH PROPOFOL;  Surgeon: Yetta Flock, MD;  Location: [facility];  Service: Gastroenterology;  Laterality: N/A;  ESOPHAGOGASTRODUODENOSCOPY (EGD) WITH PROPOFOL N/A 04/28/2019   Procedure: ESOPHAGOGASTRODUODENOSCOPY (EGD) WITH PROPOFOL;  Surgeon: Yetta Flock, MD;  Location: [facility];  Service: Gastroenterology;  Laterality: N/A;   ESOPHAGOGASTRODUODENOSCOPY (EGD) WITH PROPOFOL N/A 11/03/2019   Procedure: ESOPHAGOGASTRODUODENOSCOPY (EGD) WITH PROPOFOL;  Surgeon: Doran Stabler, MD;  Location: [facility];  Service: Gastroenterology;  Laterality: N/A;   ESOPHAGOGASTRODUODENOSCOPY (EGD) WITH PROPOFOL N/A 01/24/2021   Procedure: ESOPHAGOGASTRODUODENOSCOPY (EGD) WITH PROPOFOL;  Surgeon: Yetta Flock, MD;  Location: [facility];  Service: Gastroenterology;  Laterality: N/A;   KNEE SURGERY Left    POLYPECTOMY  01/19/2019   Procedure: POLYPECTOMY;  Surgeon: Yetta Flock, MD;  Location: [facility];  Service:  Gastroenterology;;   Family History  Problem Relation Age of Onset   Colitis Mother    Arthritis Mother    Cancer Father        lymphoma?   Cancer Paternal Grandfather        bone   Heart disease Neg Hx    Stroke Neg Hx    Diabetes Neg Hx    Social History   Tobacco Use   Smoking status: Every Day    Packs/day: 0.50    Years: 30.00    Pack years: 15.00    Types: Cigarettes   Smokeless tobacco: Former    Types: Nurse, children's Use: Never used  Substance Use Topics   Alcohol use: Not Currently    Alcohol/week: 36.0 standard drinks    Types: 36 Cans of beer per week    Comment: starts detox 01/22/22   Drug use: Yes    Types: Marijuana    Comment: daily use   Current Outpatient Medications  Medication Sig Dispense Refill   acetaminophen (TYLENOL) 500 MG tablet Take 1,000 mg by mouth every 6 (six) hours as needed (pain.).     albuterol (PROVENTIL) (2.5 MG/3ML) 0.083% nebulizer solution Take 3 mLs (2.5 mg total) by nebulization every 6 (six) hours as needed for wheezing or shortness of breath. 150 mL prn   azelastine (ASTELIN) 0.1 % nasal spray Place 1 spray into both nostrils daily as needed for allergies.     carvedilol (COREG) 6.25 MG tablet Take 6.25 mg by mouth 2 (two) times daily.     finasteride (PROSCAR) 5 MG tablet Take 5 mg by mouth daily.     fluticasone furoate-vilanterol (BREO ELLIPTA) 200-25 MCG/INH AEPB Inhale 1 puff into the lungs daily. 180 each 1   loratadine (CLARITIN) 10 MG tablet Take 10 mg by mouth daily.     losartan (COZAAR) 100 MG tablet Take 100 mg by mouth daily.     Milk Thistle 1000 MG CAPS Take 1,000 mg by mouth 2 (two) times daily.     Multiple Vitamin (MULTIVITAMIN WITH MINERALS) TABS tablet Take 1 tablet by mouth daily.     pantoprazole (PROTONIX) 40 MG tablet Take 1 tablet (40 mg total) by mouth daily.     sildenafil (VIAGRA) 50 MG tablet Take 50 mg by mouth daily as needed for erectile dysfunction.     VENTOLIN HFA 108 (90 Base)  MCG/ACT inhaler INHALE 1 PUFF EVERY 6 (SIX) HOURS AS NEEDED INTO THE LUNGS FOR WHEEZING OR SHORTNESS OF BREATH. (Patient taking differently: Inhale 1 puff into the lungs every 6 (six) hours as needed for wheezing or shortness of breath.) 18 g 1   montelukast (SINGULAIR) 10 MG tablet TAKE 1 TABLET (10 MG) BY ORAL ROUTE ONCE DAILY IN THE EVENING  FOR ALLERGIC RHINITIS FOR 90 DAYS (Patient taking differently: Take 10 mg by mouth every evening.) 90 tablet 3   venlafaxine XR (EFFEXOR-XR) 75 MG 24 hr capsule TAKE 1 CAPSULE BY MOUTH DAILY. (Patient taking differently: Take 75 mg by mouth daily.) 30 capsule 0   No current facility-administered medications for this visit.   Allergies  Allergen Reactions   Onion Anaphylaxis   Gabapentin     Other reaction(s): restless leg   Naproxen Hives and Swelling    Other reaction(s): hives     Review of Systems: All systems reviewed and negative except where noted in HPI.    Labs reviewed in Epic  Physical Exam: BP 120/78    Pulse 73    Ht '5\' 10"'$  (1.778 m)    Wt 244 lb (110.7 kg)    SpO2 97%    BMI 35.01 kg/m  Constitutional: Pleasant,well-developed, male in no acute distress. Neurological: Alert and oriented to person place and time. Psychiatric: Normal mood and affect. Behavior is normal.   ASSESSMENT AND PLAN: 50 year old male here for reassessment of the following:  Alcoholic cirrhosis Esophageal varices Alcoholism  History of variceal bleed, has had intermittent alcohol relapses, admitted last year with withdrawal symptoms, now with significant recurrence of alcohol use, at risk for withdrawal and being mated to the hospital next week for detox.  We discussed his course to date, at high risk for decompensation and alcoholic hepatitis with continued alcohol use, he understands this.  I will check his labs today to make sure stable.  He will try to start lightly reducing the amount of alcohol he is drinking but not stop until he is admitted to the  hospital next week, states he has been plugged in with psychiatry and has counseling set up etc. to help him with this.  He is due for an ultrasound in June, EGD and colonoscopy now for surveillance of varices and for surveillance of colon polyps, however in light of his recent alcohol issues this is nonurgent and these exams will be postponed.  I would like to see him back in the office in 3 months or so for reassessment, he needs to follow with behavioral health closely following his discharge from the hospital to maintain sobriety  Plan: - patient states he is being admitted per psychiatry next week for planned alcohol detox - CBC, CMET, INR, AFP today - recall [place] June  - needs an EGD and colonoscopy for surveillance of varices / polyps, but not now given pending admission for detox - f/u 3 months - alcohol abstinences once he goes through detox as inpatient  Jolly Mango, MD [facility]

## 2022-01-15 NOTE — Patient Instructions (Addendum)
If you are age 50 or older, your body mass index should be between 23-30. Your Body mass index is 35.01 kg/m?Marland Kitchen If this is out of the aforementioned range listed, please consider follow up with your Primary Care Provider. ? ?If you are age 77 or younger, your body mass index should be between 19-25. Your Body mass index is 35.01 kg/m?Marland Kitchen If this is out of the aformentioned range listed, please consider follow up with your Primary Care Provider.  ? ?________________________________________________________ ? ?The [facility] providers would like to encourage you to use [facility] to communicate with providers for non-urgent requests or questions.  Due to long hold times on the telephone, sending your provider a message by [facility] may be a faster and more efficient way to get a response.  Please allow 48 business hours for a response.  Please remember that this is for non-urgent requests.  ?_______________________________________________________ ? ? ?Please go to the lab in the basement of our building to have lab work done as you leave today. Hit "B" for basement when you get on the elevator.  When the doors open the lab is on your left.  We will call you with the results. Thank you. ? ?You will be due for a RUQ Ultrasound in June 2023. We will send you a reminder in the mail when it gets closer to that time. ? ?Please follow up in the office in 3 months (June). The schedule is not out yet.   ? ?Thank you for entrusting me with your care and for choosing [employer], ?Dr. [place] Cellar ? ? ?

## 2022-01-16 ENCOUNTER — Encounter: Payer: Self-pay | Admitting: Gastroenterology

## 2022-01-16 LAB — AFP TUMOR MARKER: AFP-Tumor Marker: 4 ng/mL (ref <6.1)

## 2022-01-22 ENCOUNTER — Encounter (HOSPITAL_COMMUNITY): Payer: Self-pay

## 2022-01-22 ENCOUNTER — Encounter: Payer: Self-pay | Admitting: Internal Medicine

## 2022-01-22 ENCOUNTER — Inpatient Hospital Stay (HOSPITAL_COMMUNITY)
Admission: EM | Admit: 2022-01-22 | Discharge: 2022-01-29 | DRG: 896 | Disposition: A | Discharge status: Home / Self Care | Payer: Medicare (Managed Care) | Attending: Internal Medicine | Admitting: Internal Medicine

## 2022-01-22 ENCOUNTER — Other Ambulatory Visit: Payer: Self-pay

## 2022-01-22 ENCOUNTER — Telehealth: Payer: Self-pay | Admitting: Internal Medicine

## 2022-01-22 DIAGNOSIS — F10231 Alcohol dependence with withdrawal delirium: Principal | ICD-10-CM | POA: Diagnosis present

## 2022-01-22 DIAGNOSIS — J449 Chronic obstructive pulmonary disease, unspecified: Secondary | ICD-10-CM | POA: Diagnosis present

## 2022-01-22 DIAGNOSIS — F32A Depression, unspecified: Secondary | ICD-10-CM | POA: Diagnosis present

## 2022-01-22 DIAGNOSIS — M549 Dorsalgia, unspecified: Secondary | ICD-10-CM | POA: Diagnosis present

## 2022-01-22 DIAGNOSIS — J309 Allergic rhinitis, unspecified: Secondary | ICD-10-CM | POA: Diagnosis present

## 2022-01-22 DIAGNOSIS — G9341 Metabolic encephalopathy: Secondary | ICD-10-CM | POA: Diagnosis present

## 2022-01-22 DIAGNOSIS — D6959 Other secondary thrombocytopenia: Secondary | ICD-10-CM | POA: Diagnosis present

## 2022-01-22 DIAGNOSIS — F10930 Alcohol use, unspecified with withdrawal, uncomplicated: Principal | ICD-10-CM

## 2022-01-22 DIAGNOSIS — K219 Gastro-esophageal reflux disease without esophagitis: Secondary | ICD-10-CM | POA: Diagnosis present

## 2022-01-22 DIAGNOSIS — B182 Chronic viral hepatitis C: Secondary | ICD-10-CM | POA: Diagnosis not present

## 2022-01-22 DIAGNOSIS — Z888 Allergy status to other drugs, medicaments and biological substances status: Secondary | ICD-10-CM

## 2022-01-22 DIAGNOSIS — Z781 Physical restraint status: Secondary | ICD-10-CM

## 2022-01-22 DIAGNOSIS — I10 Essential (primary) hypertension: Secondary | ICD-10-CM | POA: Diagnosis present

## 2022-01-22 DIAGNOSIS — K5900 Constipation, unspecified: Secondary | ICD-10-CM | POA: Diagnosis not present

## 2022-01-22 DIAGNOSIS — Z20822 Contact with and (suspected) exposure to covid-19: Secondary | ICD-10-CM | POA: Diagnosis present

## 2022-01-22 DIAGNOSIS — F10939 Alcohol use, unspecified with withdrawal, unspecified: Secondary | ICD-10-CM | POA: Diagnosis present

## 2022-01-22 DIAGNOSIS — N401 Enlarged prostate with lower urinary tract symptoms: Secondary | ICD-10-CM | POA: Diagnosis not present

## 2022-01-22 DIAGNOSIS — Z91018 Allergy to other foods: Secondary | ICD-10-CM

## 2022-01-22 DIAGNOSIS — R748 Abnormal levels of other serum enzymes: Secondary | ICD-10-CM | POA: Diagnosis present

## 2022-01-22 DIAGNOSIS — E669 Obesity, unspecified: Secondary | ICD-10-CM | POA: Diagnosis not present

## 2022-01-22 DIAGNOSIS — K7030 Alcoholic cirrhosis of liver without ascites: Secondary | ICD-10-CM

## 2022-01-22 DIAGNOSIS — F17210 Nicotine dependence, cigarettes, uncomplicated: Secondary | ICD-10-CM | POA: Diagnosis present

## 2022-01-22 DIAGNOSIS — Y900 Blood alcohol level of less than 20 mg/100 ml: Secondary | ICD-10-CM | POA: Diagnosis present

## 2022-01-22 DIAGNOSIS — E66811 Obesity, class 1: Secondary | ICD-10-CM | POA: Diagnosis present

## 2022-01-22 DIAGNOSIS — Z79899 Other long term (current) drug therapy: Secondary | ICD-10-CM

## 2022-01-22 DIAGNOSIS — G4733 Obstructive sleep apnea (adult) (pediatric): Secondary | ICD-10-CM | POA: Diagnosis present

## 2022-01-22 DIAGNOSIS — Z6834 Body mass index (BMI) 34.0-34.9, adult: Secondary | ICD-10-CM

## 2022-01-22 DIAGNOSIS — K766 Portal hypertension: Secondary | ICD-10-CM | POA: Diagnosis present

## 2022-01-22 DIAGNOSIS — R911 Solitary pulmonary nodule: Secondary | ICD-10-CM | POA: Diagnosis present

## 2022-01-22 DIAGNOSIS — F10239 Alcohol dependence with withdrawal, unspecified: Secondary | ICD-10-CM | POA: Diagnosis not present

## 2022-01-22 DIAGNOSIS — G8929 Other chronic pain: Secondary | ICD-10-CM | POA: Diagnosis present

## 2022-01-22 DIAGNOSIS — R338 Other retention of urine: Secondary | ICD-10-CM | POA: Diagnosis not present

## 2022-01-22 DIAGNOSIS — R451 Restlessness and agitation: Secondary | ICD-10-CM | POA: Diagnosis not present

## 2022-01-22 DIAGNOSIS — E785 Hyperlipidemia, unspecified: Secondary | ICD-10-CM | POA: Diagnosis present

## 2022-01-22 DIAGNOSIS — Z87892 Personal history of anaphylaxis: Secondary | ICD-10-CM

## 2022-01-22 DIAGNOSIS — E871 Hypo-osmolality and hyponatremia: Secondary | ICD-10-CM

## 2022-01-22 DIAGNOSIS — F17200 Nicotine dependence, unspecified, uncomplicated: Secondary | ICD-10-CM | POA: Diagnosis present

## 2022-01-22 DIAGNOSIS — F419 Anxiety disorder, unspecified: Secondary | ICD-10-CM | POA: Diagnosis present

## 2022-01-22 LAB — CBC WITH DIFFERENTIAL/PLATELET
Abs Immature Granulocytes: 0.05 10*3/uL (ref 0.00–0.07)
Basophils Absolute: 0.1 10*3/uL (ref 0.0–0.1)
Basophils Relative: 1 %
Eosinophils Absolute: 0.1 10*3/uL (ref 0.0–0.5)
Eosinophils Relative: 2 %
HCT: 42 % (ref 39.0–52.0)
Hemoglobin: 13.8 g/dL (ref 13.0–17.0)
Immature Granulocytes: 1 %
Lymphocytes Relative: 17 %
Lymphs Abs: 1.4 10*3/uL (ref 0.7–4.0)
MCH: 28.5 pg (ref 26.0–34.0)
MCHC: 32.9 g/dL (ref 30.0–36.0)
MCV: 86.8 fL (ref 80.0–100.0)
Monocytes Absolute: 1.1 10*3/uL — ABNORMAL HIGH (ref 0.1–1.0)
Monocytes Relative: 13 %
Neutro Abs: 5.5 10*3/uL (ref 1.7–7.7)
Neutrophils Relative %: 66 %
Platelets: 150 10*3/uL (ref 150–400)
RBC: 4.84 MIL/uL (ref 4.22–5.81)
RDW: 16.9 % — ABNORMAL HIGH (ref 11.5–15.5)
WBC: 8.1 10*3/uL (ref 4.0–10.5)
nRBC: 0 % (ref 0.0–0.2)

## 2022-01-22 LAB — COMPREHENSIVE METABOLIC PANEL
ALT: 38 U/L (ref 0–44)
AST: 53 U/L — ABNORMAL HIGH (ref 15–41)
Albumin: 4.8 g/dL (ref 3.5–5.0)
Alkaline Phosphatase: 67 U/L (ref 38–126)
Anion gap: 11 (ref 5–15)
BUN: 13 mg/dL (ref 6–20)
CO2: 27 mmol/L (ref 22–32)
Calcium: 9.7 mg/dL (ref 8.9–10.3)
Chloride: 99 mmol/L (ref 98–111)
Creatinine, Ser: 0.9 mg/dL (ref 0.61–1.24)
GFR, Estimated: >60 mL/min (ref >60)
Glucose, Bld: 115 mg/dL — ABNORMAL HIGH (ref 70–99)
Potassium: 4.5 mmol/L (ref 3.5–5.1)
Sodium: 137 mmol/L (ref 135–145)
Total Bilirubin: 1 mg/dL (ref 0.3–1.2)
Total Protein: 8.8 g/dL — ABNORMAL HIGH (ref 6.5–8.1)

## 2022-01-22 LAB — RESP PANEL BY RT-PCR (FLU A&B, COVID) ARPGX2
Influenza A by PCR: NEGATIVE
Influenza B by PCR: NEGATIVE
SARS Coronavirus 2 by RT PCR: NEGATIVE

## 2022-01-22 LAB — PHOSPHORUS: Phosphorus: 2.9 mg/dL (ref 2.5–4.6)

## 2022-01-22 LAB — MRSA NEXT GEN BY PCR, NASAL: MRSA by PCR Next Gen: NOT DETECTED

## 2022-01-22 LAB — MAGNESIUM: Magnesium: 2 mg/dL (ref 1.7–2.4)

## 2022-01-22 LAB — ETHANOL: Alcohol, Ethyl (B): <10 mg/dL (ref <10)

## 2022-01-22 MED ORDER — TAMSULOSIN HCL 0.4 MG PO CAPS
0.4000 mg | ORAL_CAPSULE | Freq: Every day | ORAL | Status: DC
  Administered 2022-01-23: 0.4 mg via ORAL
  Filled 2022-01-22: qty 1

## 2022-01-22 MED ORDER — FOLIC ACID 1 MG PO TABS
1.0000 mg | ORAL_TABLET | Freq: Every day | ORAL | Status: DC
  Administered 2022-01-22 – 2022-01-23 (×2): 1 mg via ORAL
  Filled 2022-01-22 (×2): qty 1

## 2022-01-22 MED ORDER — LORAZEPAM 1 MG PO TABS: 0.0000 mg | ORAL_TABLET | Freq: Two times a day (BID) | ORAL | Status: DC

## 2022-01-22 MED ORDER — LORAZEPAM 2 MG/ML IJ SOLN: 0.0000 mg | INTRAMUSCULAR | Status: DC | PRN

## 2022-01-22 MED ORDER — LOSARTAN POTASSIUM 50 MG PO TABS
100.0000 mg | ORAL_TABLET | Freq: Every day | ORAL | Status: DC
  Administered 2022-01-23: 100 mg via ORAL
  Filled 2022-01-22: qty 2

## 2022-01-22 MED ORDER — THIAMINE HCL 100 MG PO TABS
100.0000 mg | ORAL_TABLET | Freq: Every day | ORAL | Status: DC
Start: 2022-01-22 — End: 2022-01-22

## 2022-01-22 MED ORDER — THIAMINE HCL 100 MG PO TABS
100.0000 mg | ORAL_TABLET | Freq: Every day | ORAL | Status: DC
  Administered 2022-01-23 – 2022-01-29 (×4): 100 mg via ORAL
  Filled 2022-01-22 (×4): qty 1

## 2022-01-22 MED ORDER — LORAZEPAM 2 MG/ML IJ SOLN
0.0000 mg | Freq: Four times a day (QID) | INTRAMUSCULAR | Status: DC
  Administered 2022-01-22: 2 mg via INTRAVENOUS
  Filled 2022-01-22: qty 1

## 2022-01-22 MED ORDER — PHENOBARBITAL SODIUM 130 MG/ML IJ SOLN: 130.0000 mg | INTRAMUSCULAR | Status: DC | PRN

## 2022-01-22 MED ORDER — LORAZEPAM 2 MG/ML IJ SOLN
0.0000 mg | INTRAMUSCULAR | Status: DC
  Administered 2022-01-22 (×2): 2 mg via INTRAVENOUS
  Filled 2022-01-22 (×2): qty 1

## 2022-01-22 MED ORDER — THIAMINE HCL 100 MG/ML IJ SOLN
100.0000 mg | Freq: Every day | INTRAMUSCULAR | Status: DC
  Administered 2022-01-22: 100 mg via INTRAVENOUS
  Filled 2022-01-22: qty 2

## 2022-01-22 MED ORDER — MONTELUKAST SODIUM 10 MG PO TABS
10.0000 mg | ORAL_TABLET | Freq: Every evening | ORAL | Status: DC
  Administered 2022-01-22 – 2022-01-23 (×2): 10 mg via ORAL
  Filled 2022-01-22 (×2): qty 1

## 2022-01-22 MED ORDER — MAGNESIUM SULFATE 2 GM/50ML IV SOLN
2.0000 g | Freq: Once | INTRAVENOUS | Status: AC
  Administered 2022-01-22: 2 g via INTRAVENOUS
  Filled 2022-01-22: qty 50

## 2022-01-22 MED ORDER — ONDANSETRON HCL 4 MG PO TABS: 4.0000 mg | ORAL_TABLET | Freq: Four times a day (QID) | ORAL | Status: DC | PRN

## 2022-01-22 MED ORDER — ONDANSETRON HCL 4 MG/2ML IJ SOLN
4.0000 mg | Freq: Four times a day (QID) | INTRAMUSCULAR | Status: DC | PRN
  Administered 2022-01-23 – 2022-01-27 (×2): 4 mg via INTRAVENOUS
  Filled 2022-01-22 (×3): qty 2

## 2022-01-22 MED ORDER — FINASTERIDE 5 MG PO TABS
5.0000 mg | ORAL_TABLET | Freq: Every day | ORAL | Status: DC
  Administered 2022-01-23: 5 mg via ORAL
  Filled 2022-01-22: qty 1

## 2022-01-22 MED ORDER — ALBUTEROL SULFATE (2.5 MG/3ML) 0.083% IN NEBU: 2.5000 mg | INHALATION_SOLUTION | Freq: Four times a day (QID) | RESPIRATORY_TRACT | Status: DC | PRN

## 2022-01-22 MED ORDER — LORAZEPAM 1 MG PO TABS
1.0000 mg | ORAL_TABLET | ORAL | Status: DC | PRN
  Administered 2022-01-22 – 2022-01-23 (×2): 1 mg via ORAL
  Administered 2022-01-23 (×2): 2 mg via ORAL
  Administered 2022-01-23: 4 mg via ORAL
  Filled 2022-01-22 (×2): qty 1
  Filled 2022-01-22: qty 4
  Filled 2022-01-22 (×2): qty 2

## 2022-01-22 MED ORDER — AZELASTINE HCL 0.1 % NA SOLN: 1.0000 | Freq: Every day | NASAL | Status: DC | PRN

## 2022-01-22 MED ORDER — CHLORHEXIDINE GLUCONATE CLOTH 2 % EX PADS
6.0000 | MEDICATED_PAD | Freq: Every day | CUTANEOUS | Status: DC
  Administered 2022-01-23 – 2022-01-29 (×6): 6 via TOPICAL

## 2022-01-22 MED ORDER — CARVEDILOL 3.125 MG PO TABS
6.2500 mg | ORAL_TABLET | Freq: Two times a day (BID) | ORAL | Status: DC
  Administered 2022-01-22 – 2022-01-23 (×2): 6.25 mg via ORAL
  Filled 2022-01-22 (×2): qty 2

## 2022-01-22 MED ORDER — ACETAMINOPHEN 325 MG PO TABS
650.0000 mg | ORAL_TABLET | Freq: Four times a day (QID) | ORAL | Status: DC | PRN
  Administered 2022-01-22 – 2022-01-28 (×4): 650 mg via ORAL
  Filled 2022-01-22 (×4): qty 2

## 2022-01-22 MED ORDER — LORATADINE 10 MG PO TABS
10.0000 mg | ORAL_TABLET | Freq: Every day | ORAL | Status: DC
  Administered 2022-01-23: 10 mg via ORAL
  Filled 2022-01-22: qty 1

## 2022-01-22 MED ORDER — NICOTINE 21 MG/24HR TD PT24
21.0000 mg | MEDICATED_PATCH | Freq: Every day | TRANSDERMAL | Status: DC
  Administered 2022-01-22 – 2022-01-28 (×7): 21 mg via TRANSDERMAL
  Filled 2022-01-22 (×8): qty 1

## 2022-01-22 MED ORDER — VENLAFAXINE HCL ER 75 MG PO CP24
75.0000 mg | ORAL_CAPSULE | Freq: Every day | ORAL | Status: DC
  Administered 2022-01-23: 75 mg via ORAL
  Filled 2022-01-22 (×2): qty 1

## 2022-01-22 MED ORDER — PROCHLORPERAZINE EDISYLATE 10 MG/2ML IJ SOLN
5.0000 mg | INTRAMUSCULAR | Status: DC | PRN
Start: 2022-01-22 — End: 2022-01-22

## 2022-01-22 MED ORDER — LORAZEPAM 1 MG PO TABS: 0.0000 mg | ORAL_TABLET | Freq: Four times a day (QID) | ORAL | Status: DC

## 2022-01-22 MED ORDER — LORAZEPAM 2 MG/ML IJ SOLN: 0.0000 mg | Freq: Two times a day (BID) | INTRAMUSCULAR | Status: DC

## 2022-01-22 MED ORDER — PANTOPRAZOLE SODIUM 40 MG PO TBEC
40.0000 mg | DELAYED_RELEASE_TABLET | Freq: Every day | ORAL | Status: DC
  Administered 2022-01-23: 40 mg via ORAL
  Filled 2022-01-22: qty 1

## 2022-01-22 MED ORDER — ADULT MULTIVITAMIN W/MINERALS CH
1.0000 | ORAL_TABLET | Freq: Every day | ORAL | Status: DC
  Administered 2022-01-22 – 2022-01-23 (×2): 1 via ORAL
  Filled 2022-01-22 (×2): qty 1

## 2022-01-22 MED ORDER — LORAZEPAM 2 MG/ML IJ SOLN: 0.0000 mg | Freq: Three times a day (TID) | INTRAMUSCULAR | Status: DC

## 2022-01-22 MED ORDER — LORAZEPAM 2 MG/ML IJ SOLN
1.0000 mg | INTRAMUSCULAR | Status: DC | PRN
  Administered 2022-01-23: 4 mg via INTRAVENOUS
  Administered 2022-01-23: 2 mg via INTRAVENOUS
  Filled 2022-01-22 (×2): qty 2

## 2022-01-22 MED ORDER — ACETAMINOPHEN 650 MG RE SUPP
650.0000 mg | Freq: Four times a day (QID) | RECTAL | Status: DC | PRN
Start: 2022-01-22 — End: 2022-01-29

## 2022-01-22 MED ORDER — THIAMINE HCL 100 MG/ML IJ SOLN
100.0000 mg | Freq: Every day | INTRAMUSCULAR | Status: DC
  Administered 2022-01-24 – 2022-01-26 (×3): 100 mg via INTRAVENOUS
  Filled 2022-01-22 (×4): qty 2

## 2022-01-22 NOTE — Progress Notes (Signed)
Requested direct admission from admitting hospitalist who advised that patient go directly  to the ED given his history of rapid decline and previous life-threatening withdrawal. Called and spoke wit patient who is now in the ED at [facility]. ?

## 2022-01-22 NOTE — ED Triage Notes (Signed)
Patient states he is having withdrawal from alcohol. Patient states his last drink was last night and he had approx 20 cans of beer.  Patient states he normally drinks 18-24 cans of beer daily. ? ?Patient currently c/o a severe headache and nausea. ? ?

## 2022-01-22 NOTE — H&P (Signed)
History and Physical    Patient: Levi Alvarez QMG:[id number] DOB: 07-11-1971 DOA: 01/22/2022 DOS: the patient was seen and examined on 01/22/2022 PCP: Sonia Side., FNP  Patient coming from: Home  Chief Complaint: No chief complaint on file.  HPI: Levi Alvarez is a 51 y.o. male with medical history significant of chronic hep C, alcoholism, liver cirrhosis, esophageal varices, upper GI bleed, lung nodule, sleep apnea, COPD, cervical DDD, left inguinal hernia, umbilical hernia who is coming to the emergency department for EtOH withdrawal as he was having tremors since this morning associated with nausea and emesis.  The patient drinks 20 to 24 twelve ounces cans of beer a day.  He used to drink liquid in the past, but is not drinking hard liquor anymore.  He spoke to his palliative care physician, Dr. Hilma Favors this morning who referred him to the emergency department.  He has a history of difficult withdrawals requiring Precedex and phenobarbital in the past.  Dr. Hilma Favors spoke to me and recommended phenobarbital load per pharmacy to decrease risk of seizures and need for benzodiazepines.  He denies fever, chills, dyspnea, wheezing, chest pain, palpitations, abdominal pain, diarrhea, constipation, melena or hematochezia.  No flank pain, dysuria, frequency or hematuria.  No polyuria, polydipsia, polyphagia or blurred vision.  ED course: Initial vital signs were temperature 98.4 F, pulse 68, respiration 18, BP 150/96 mmHg O2 sat 96% on room air.  The patient was started on CIWA protocol in the emergency department.  I added magnesium sulfate 2 g IVPB x1.  Lab work: CBC is her white count 8.1, hemoglobin 13.8 g/dL platelets 150.  Alcohol was less than 10, magnesium is 2.0 and phosphorus 2.9.  CMP showed a glucose of 150 mg/dL, total protein 8.8 g/dL and AST 53 units/L.   Review of Systems: As mentioned in the history of present illness. All other systems reviewed and are negative. Past Medical  History:  Diagnosis Date   Alcoholism ([place])    Blood transfusion without reported diagnosis    jan, 2020 3 units PRBC   Cirrhosis ([place])    secondary to alcohol and hep C   Constipation    Resolved   COPD (chronic obstructive pulmonary disease) (HCC)    DDD (degenerative disc disease), cervical    ED (erectile dysfunction)    Esophageal varices (HCC)    GERD (gastroesophageal reflux disease)    H/O: upper GI bleed    Hepatitis C antibody positive in blood    resolved   High blood pressure    History of anemia    Left inguinal hernia 2020   Lung nodule    Small   Sleep apnea    trying to get used to his CPAP, not wearing regularly   Umbilical hernia 3267   Past Surgical History:  Procedure Laterality Date   arm surgery     left arm,   COLONOSCOPY WITH PROPOFOL N/A 01/19/2019   Procedure: COLONOSCOPY WITH PROPOFOL;  Surgeon: Yetta Flock, MD;  Location: [facility];  Service: Gastroenterology;  Laterality: N/A;   ESOPHAGEAL BANDING  11/25/2018   Procedure: ESOPHAGEAL BANDING;  Surgeon: Milus Banister, MD;  Location: [facility];  Service: Endoscopy;;   ESOPHAGEAL BANDING  01/19/2019   Procedure: ESOPHAGEAL BANDING;  Surgeon: Yetta Flock, MD;  Location: [facility];  Service: Gastroenterology;;   ESOPHAGEAL BANDING N/A 01/24/2021   Procedure: ESOPHAGEAL BANDING;  Surgeon: Yetta Flock, MD;  Location: [facility];  Service: Gastroenterology;  Laterality:  N/A;   ESOPHAGOGASTRODUODENOSCOPY (EGD) WITH PROPOFOL N/A 11/25/2018   Procedure: ESOPHAGOGASTRODUODENOSCOPY (EGD) WITH PROPOFOL;  Surgeon: Milus Banister, MD;  Location: [facility];  Service: Endoscopy;  Laterality: N/A;   ESOPHAGOGASTRODUODENOSCOPY (EGD) WITH PROPOFOL N/A 01/19/2019   Procedure: ESOPHAGOGASTRODUODENOSCOPY (EGD) WITH PROPOFOL;  Surgeon: Yetta Flock, MD;  Location: [facility];  Service: Gastroenterology;  Laterality: N/A;   ESOPHAGOGASTRODUODENOSCOPY (EGD) WITH PROPOFOL N/A  04/28/2019   Procedure: ESOPHAGOGASTRODUODENOSCOPY (EGD) WITH PROPOFOL;  Surgeon: Yetta Flock, MD;  Location: [facility];  Service: Gastroenterology;  Laterality: N/A;   ESOPHAGOGASTRODUODENOSCOPY (EGD) WITH PROPOFOL N/A 11/03/2019   Procedure: ESOPHAGOGASTRODUODENOSCOPY (EGD) WITH PROPOFOL;  Surgeon: Doran Stabler, MD;  Location: [facility];  Service: Gastroenterology;  Laterality: N/A;   ESOPHAGOGASTRODUODENOSCOPY (EGD) WITH PROPOFOL N/A 01/24/2021   Procedure: ESOPHAGOGASTRODUODENOSCOPY (EGD) WITH PROPOFOL;  Surgeon: Yetta Flock, MD;  Location: [facility];  Service: Gastroenterology;  Laterality: N/A;   KNEE SURGERY Left    POLYPECTOMY  01/19/2019   Procedure: POLYPECTOMY;  Surgeon: Yetta Flock, MD;  Location: [facility];  Service: Gastroenterology;;   Social History:  reports that he has been smoking cigarettes. He has a 15.00 pack-year smoking history. He has quit using smokeless tobacco.  His smokeless tobacco use included chew. He reports current alcohol use of about 36.0 standard drinks per week. He reports current drug use. Drug: Marijuana.  Allergies  Allergen Reactions   Onion Anaphylaxis   Gabapentin Other (See Comments)    Restless legs   Naproxen Hives and Swelling    Family History  Problem Relation Age of Onset   Colitis Mother    Arthritis Mother    Dementia Mother    Cancer Father        lymphoma?   Cancer Paternal Grandfather        bone   Heart disease Neg Hx    Stroke Neg Hx    Diabetes Neg Hx     Prior to Admission medications   Medication Sig Start Date End Date Taking? Authorizing Provider  albuterol (PROVENTIL) (2.5 MG/3ML) 0.083% nebulizer solution Take 3 mLs (2.5 mg total) by nebulization every 6 (six) hours as needed for wheezing or shortness of breath. 12/02/18  Yes Gildardo Pounds, NP  azelastine (ASTELIN) 0.1 % nasal spray Place 1 spray into both nostrils daily as needed for allergies. 03/21/21  Yes [provider]  carvedilol (COREG) 6.25 MG tablet Take 6.25 mg by mouth 2 (two) times daily. 02/01/21  Yes [provider]  finasteride (PROSCAR) 5 MG tablet Take 5 mg by mouth daily. 06/30/21  Yes [provider]  fluticasone furoate-vilanterol (BREO ELLIPTA) 200-25 MCG/INH AEPB Inhale 1 puff into the lungs daily. 09/25/19  Yes Gildardo Pounds, NP  folic acid (FOLVITE) 1 MG tablet Take 1 mg by mouth in the morning.   Yes [provider]  loratadine (CLARITIN) 10 MG tablet Take 10 mg by mouth daily.   Yes [provider]  losartan (COZAAR) 100 MG tablet Take 100 mg by mouth daily. 12/24/20  Yes [provider]  Milk Thistle 1000 MG CAPS Take 1,000 mg by mouth 2 (two) times daily.   Yes [provider]  montelukast (SINGULAIR) 10 MG tablet TAKE 1 TABLET (10 MG) BY ORAL ROUTE ONCE DAILY IN THE EVENING FOR ALLERGIC RHINITIS FOR 90 DAYS Patient taking differently: Take 10 mg by mouth every evening. 09/15/20 02/23/22 Yes Sonia Side., FNP  Multiple Vitamin (MULTIVITAMIN WITH MINERALS) TABS  tablet Take 1 tablet by mouth daily with breakfast.   Yes [provider]  pantoprazole (PROTONIX) 40 MG tablet Take 1 tablet (40 mg total) by mouth daily. Patient taking differently: Take 40 mg by mouth daily before breakfast. 04/24/21  Yes Corey Harold, NP  tamsulosin (FLOMAX) 0.4 MG CAPS capsule Take 0.4 mg by mouth daily.   Yes [provider]  Thiamine HCl (VITAMIN B1) 100 MG TABS Take 100 mg by mouth in the morning.   Yes [provider]  venlafaxine XR (EFFEXOR-XR) 75 MG 24 hr capsule TAKE 1 CAPSULE BY MOUTH DAILY. Patient taking differently: Take 75 mg by mouth daily. 08/15/20 02/22/22 Yes Deveron Furlong, NP  VENTOLIN HFA 108 (90 Base) MCG/ACT inhaler INHALE 1 PUFF EVERY 6 (SIX) HOURS AS NEEDED INTO THE LUNGS FOR WHEEZING OR SHORTNESS OF BREATH. Patient taking differently: Inhale 2 puffs into the lungs every 6 (six) hours as  needed for wheezing or shortness of breath. 05/19/20  Yes Charlott Rakes, MD  sildenafil (VIAGRA) 50 MG tablet Take 50 mg by mouth daily as needed for erectile dysfunction.    [provider]    Physical Exam: Vitals:   01/22/22 1700 01/22/22 1730 01/22/22 1800 01/22/22 1845  BP: 135/89 (!) 140/93  126/83  Pulse: 72 74 93 74  Resp: '17 13 20 20  '$ Temp:      TempSrc:      SpO2: 96% 100% 96% 94%  Weight:      Height:       Physical Exam Constitutional:      Appearance: He is obese.  HENT:     Head: Normocephalic.     Mouth/Throat:     Mouth: Mucous membranes are moist.  Eyes:     General: No scleral icterus.    Pupils: Pupils are equal, round, and reactive to light.  Neck:     Vascular: No JVD.  Cardiovascular:     Rate and Rhythm: Normal rate and regular rhythm.     Heart sounds: S1 normal and S2 normal.  Pulmonary:     Effort: Pulmonary effort is normal.     Breath sounds: Normal breath sounds. No wheezing, rhonchi or rales.  Abdominal:     General: Abdomen is protuberant. Bowel sounds are normal.     Palpations: Abdomen is soft.     Tenderness: There is no abdominal tenderness. There is no guarding.     Hernia: A hernia is present. Hernia is present in the umbilical area and left inguinal area.  Musculoskeletal:     Cervical back: Neck supple.     Right lower leg: No edema.     Left lower leg: No edema.  Skin:    General: Skin is warm and dry.  Neurological:     General: No focal deficit present.     Mental Status: He is alert and oriented to person, place, and time.     Comments: Basal tremor and mild tongue fasciculations.  Psychiatric:        Mood and Affect: Mood normal.        Behavior: Behavior normal.    Data Reviewed:  There are no new results to review at this time.  Assessment and Plan: Principal Problem:   Alcohol withdrawal ([place]) Observation/stepdown. Folate supplementation. MVI supplementation. Thiamine supplementation. Magnesium  sulfate 2 g IVPB. Lorazepam per CIWA protocol. Consult pharmacy for phenobarbital loading dose. Consult transitional care team.  Active Problems:   Chronic hepatitis C without hepatic coma (  [place])   With   Elevated liver enzymes Alcohol cessation advised. Follow-up with GI/hepatology.    Essential hypertension Continue carvedilol 6.25 mg p.o. daily. Continue losartan 100 mg p.o. daily. Monitor BP, heart rate renal function and electrolytes.    OSA (obstructive sleep apnea) Not on CPAP.    Smoker Nicotine replacement therapy ordered.    Hyperlipidemia Currently not on medical therapy.   Chronic obstructive pulmonary disease (HCC) Bronchodilators as needed. Supplemental oxygen as needed.    Class 1 obesity Lifestyle modifications. Follow-up with PCP.     Advance Care Planning:   Code Status: Full Code   Consults: Pharmacy consulted for phenobarbital loading.  Family Communication: His significant other was at bedside.  Severity of Illness: The appropriate patient status for this patient is OBSERVATION. Observation status is judged to be reasonable and necessary in order to provide the required intensity of service to ensure the patient's safety. The patient's presenting symptoms, physical exam findings, and initial radiographic and laboratory data in the context of their medical condition is felt to place them at decreased risk for further clinical deterioration. Furthermore, it is anticipated that the patient will be medically stable for discharge from the hospital within 2 midnights of admission.   Author: Reubin Milan, MD 01/22/2022 6:50 PM  For on call review [email or web address].   This document was prepared using Dragon voice recognition software and may contain some unintended transcription errors.

## 2022-01-22 NOTE — Plan of Care (Signed)

## 2022-01-22 NOTE — Progress Notes (Signed)
Palliative Care ?Request for Consultation ? ?Call received from patient's spouse requesting palliative care assistance. Our team has assisted with several other of her family members in the past. ? ?Levi Alvarez is 51 years old and has severe alcohol induced liver disease and treatment resistant alcohol use disorder. He has expressed a desire to detox and stop drinking because his health has deteriorated and he wants to live as well as possible for as long as he can. He currently drinks 18-24 drinks a day and has had use disorder for 35 years. He begins to have signs of withdraw after only a few hours of not drinking and has a history of complex withdrawal. In June of 2022 he had a prolonged hospital stay and required ICU level care with multiple medications ativan, precedex and sedation to control life-threatening detox with metabolic derangements and agitation. ? ?Based on his strong desire to detox I have agreed to help get him the care he needs to meet this goal. I spoke with Dr. Dwyane Dee with Psychiatry and he is too medically complex and fragile to detox at [facility]. She advised direct admission to ICU or step down given his history. We also discussed the possibility of using ketamine to help with detox. ? ?I spoke with patient and his wife this AM and they agree to pursuing direct admission. He is already becoming symptomatic with shakiness, NV. ? ?I will provide consultation for symptom management and help with his care coordination as needed. Request placed for direct admission and will update patient on status. ? ?Lane Hacker, DO ?Palliative Medicine ? ?

## 2022-01-22 NOTE — ED Provider Notes (Incomplete)
[facility] DEPT Provider Note   CSN: [id number] Arrival date & time: 01/22/22  1233     History {Add pertinent medical, surgical, social history, OB history to HPI:1} No chief complaint on file.   Levi Alvarez is a 51 y.o. male.  HPI     51 year old male comes in with chief complaint of alcohol withdrawals.  Home Medications Prior to Admission medications   Medication Sig Start Date End Date Taking? Authorizing Provider  acetaminophen (TYLENOL) 500 MG tablet Take 1,000 mg by mouth every 6 (six) hours as needed (pain.).    [provider]  albuterol (PROVENTIL) (2.5 MG/3ML) 0.083% nebulizer solution Take 3 mLs (2.5 mg total) by nebulization every 6 (six) hours as needed for wheezing or shortness of breath. 12/02/18   Gildardo Pounds, NP  azelastine (ASTELIN) 0.1 % nasal spray Place 1 spray into both nostrils daily as needed for allergies. 03/21/21   [provider]  carvedilol (COREG) 6.25 MG tablet Take 6.25 mg by mouth 2 (two) times daily. 02/01/21   [provider]  finasteride (PROSCAR) 5 MG tablet Take 5 mg by mouth daily. 06/30/21   [provider]  fluticasone furoate-vilanterol (BREO ELLIPTA) 200-25 MCG/INH AEPB Inhale 1 puff into the lungs daily. 09/25/19   Gildardo Pounds, NP  loratadine (CLARITIN) 10 MG tablet Take 10 mg by mouth daily.    [provider]  losartan (COZAAR) 100 MG tablet Take 100 mg by mouth daily. 12/24/20   [provider]  Milk Thistle 1000 MG CAPS Take 1,000 mg by mouth 2 (two) times daily.    [provider]  montelukast (SINGULAIR) 10 MG tablet TAKE 1 TABLET (10 MG) BY ORAL ROUTE ONCE DAILY IN THE EVENING FOR ALLERGIC RHINITIS FOR 90 DAYS Patient taking differently: Take 10 mg by mouth every evening. 09/15/20 09/15/21  Sonia Side., FNP  Multiple Vitamin (MULTIVITAMIN WITH MINERALS) TABS tablet Take 1 tablet by mouth daily.    [provider]   pantoprazole (PROTONIX) 40 MG tablet Take 1 tablet (40 mg total) by mouth daily. 04/24/21   Corey Harold, NP  sildenafil (VIAGRA) 50 MG tablet Take 50 mg by mouth daily as needed for erectile dysfunction.    [provider]  venlafaxine XR (EFFEXOR-XR) 75 MG 24 hr capsule TAKE 1 CAPSULE BY MOUTH DAILY. Patient taking differently: Take 75 mg by mouth daily. 08/15/20 08/15/21  Deveron Furlong, NP  VENTOLIN HFA 108 (90 Base) MCG/ACT inhaler INHALE 1 PUFF EVERY 6 (SIX) HOURS AS NEEDED INTO THE LUNGS FOR WHEEZING OR SHORTNESS OF BREATH. Patient taking differently: Inhale 1 puff into the lungs every 6 (six) hours as needed for wheezing or shortness of breath. 05/19/20   Charlott Rakes, MD      Allergies    Onion, Gabapentin, and Naproxen    Review of Systems   Review of Systems  Physical Exam Updated Vital Signs BP (!) 130/95    Pulse 67    Temp 98.4 F (36.9 C) (Oral)    Resp 13    Ht '5\' 10"'$  (1.778 m)    Wt 108.9 kg    SpO2 95%    BMI 34.44 kg/m  Physical Exam  ED Results / Procedures / Treatments   Labs (all labs ordered are listed, but only abnormal results are displayed) Labs Reviewed  CBC WITH DIFFERENTIAL/PLATELET - Abnormal; Notable for the following components:      Result Value   RDW 16.9 (*)  Monocytes Absolute 1.1 (*)    All other components within normal limits  COMPREHENSIVE METABOLIC PANEL - Abnormal; Notable for the following components:   Glucose, Bld 115 (*)    Total Protein 8.8 (*)    AST 53 (*)    All other components within normal limits  RESP PANEL BY RT-PCR (FLU A&B, COVID) ARPGX2  ETHANOL  MAGNESIUM  PHOSPHORUS    EKG None  Radiology No results found.  Procedures Procedures  {Document cardiac monitor, telemetry assessment procedure when appropriate:1}  Medications Ordered in ED Medications  LORazepam (ATIVAN) injection 0-4 mg (2 mg Intravenous Given 01/22/22 1435)    Or  LORazepam (ATIVAN) tablet 0-4 mg ( Oral See Alternative 01/22/22  1435)  LORazepam (ATIVAN) injection 0-4 mg (has no administration in time range)    Or  LORazepam (ATIVAN) tablet 0-4 mg (has no administration in time range)  thiamine tablet 100 mg ( Oral See Alternative 01/22/22 1434)    Or  thiamine (B-1) injection 100 mg (100 mg Intravenous Given 01/22/22 1434)    ED Course/ Medical Decision Making/ A&P                           Medical Decision Making Amount and/or Complexity of Data Reviewed Labs: ordered.  Risk OTC drugs. Prescription drug management. Decision regarding hospitalization.   ***  {Document critical care time when appropriate:1} {Document review of labs and clinical decision tools ie heart score, Chads2Vasc2 etc:1}  {Document your independent review of radiology images, and any outside records:1} {Document your discussion with family members, caretakers, and with consultants:1} {Document social determinants of health affecting pt's care:1} {Document your decision making why or why not admission, treatments were needed:1} Final Clinical Impression(s) / ED Diagnoses Final diagnoses:  None    Rx / DC Orders ED Discharge Orders     None

## 2022-01-23 DIAGNOSIS — N401 Enlarged prostate with lower urinary tract symptoms: Secondary | ICD-10-CM | POA: Diagnosis not present

## 2022-01-23 DIAGNOSIS — F10239 Alcohol dependence with withdrawal, unspecified: Secondary | ICD-10-CM | POA: Diagnosis present

## 2022-01-23 DIAGNOSIS — J438 Other emphysema: Secondary | ICD-10-CM | POA: Diagnosis not present

## 2022-01-23 DIAGNOSIS — F10931 Alcohol use, unspecified with withdrawal delirium: Secondary | ICD-10-CM

## 2022-01-23 DIAGNOSIS — K766 Portal hypertension: Secondary | ICD-10-CM | POA: Diagnosis present

## 2022-01-23 DIAGNOSIS — F10930 Alcohol use, unspecified with withdrawal, uncomplicated: Secondary | ICD-10-CM | POA: Diagnosis not present

## 2022-01-23 DIAGNOSIS — G8929 Other chronic pain: Secondary | ICD-10-CM | POA: Diagnosis present

## 2022-01-23 DIAGNOSIS — J449 Chronic obstructive pulmonary disease, unspecified: Secondary | ICD-10-CM | POA: Diagnosis present

## 2022-01-23 DIAGNOSIS — R451 Restlessness and agitation: Secondary | ICD-10-CM | POA: Diagnosis not present

## 2022-01-23 DIAGNOSIS — F17210 Nicotine dependence, cigarettes, uncomplicated: Secondary | ICD-10-CM | POA: Diagnosis present

## 2022-01-23 DIAGNOSIS — J309 Allergic rhinitis, unspecified: Secondary | ICD-10-CM | POA: Diagnosis present

## 2022-01-23 DIAGNOSIS — G4733 Obstructive sleep apnea (adult) (pediatric): Secondary | ICD-10-CM | POA: Diagnosis present

## 2022-01-23 DIAGNOSIS — F10231 Alcohol dependence with withdrawal delirium: Secondary | ICD-10-CM | POA: Diagnosis present

## 2022-01-23 DIAGNOSIS — K219 Gastro-esophageal reflux disease without esophagitis: Secondary | ICD-10-CM | POA: Diagnosis present

## 2022-01-23 DIAGNOSIS — Z20822 Contact with and (suspected) exposure to covid-19: Secondary | ICD-10-CM | POA: Diagnosis present

## 2022-01-23 DIAGNOSIS — Y900 Blood alcohol level of less than 20 mg/100 ml: Secondary | ICD-10-CM | POA: Diagnosis present

## 2022-01-23 DIAGNOSIS — I10 Essential (primary) hypertension: Secondary | ICD-10-CM | POA: Diagnosis present

## 2022-01-23 DIAGNOSIS — B182 Chronic viral hepatitis C: Secondary | ICD-10-CM | POA: Diagnosis present

## 2022-01-23 DIAGNOSIS — R911 Solitary pulmonary nodule: Secondary | ICD-10-CM | POA: Diagnosis present

## 2022-01-23 DIAGNOSIS — D6959 Other secondary thrombocytopenia: Secondary | ICD-10-CM | POA: Diagnosis present

## 2022-01-23 DIAGNOSIS — G9341 Metabolic encephalopathy: Secondary | ICD-10-CM | POA: Diagnosis present

## 2022-01-23 DIAGNOSIS — F419 Anxiety disorder, unspecified: Secondary | ICD-10-CM | POA: Diagnosis present

## 2022-01-23 DIAGNOSIS — E871 Hypo-osmolality and hyponatremia: Secondary | ICD-10-CM | POA: Diagnosis present

## 2022-01-23 DIAGNOSIS — K7030 Alcoholic cirrhosis of liver without ascites: Secondary | ICD-10-CM | POA: Diagnosis present

## 2022-01-23 DIAGNOSIS — F32A Depression, unspecified: Secondary | ICD-10-CM | POA: Diagnosis present

## 2022-01-23 DIAGNOSIS — E669 Obesity, unspecified: Secondary | ICD-10-CM | POA: Diagnosis present

## 2022-01-23 DIAGNOSIS — K5900 Constipation, unspecified: Secondary | ICD-10-CM | POA: Diagnosis not present

## 2022-01-23 DIAGNOSIS — E785 Hyperlipidemia, unspecified: Secondary | ICD-10-CM | POA: Diagnosis present

## 2022-01-23 DIAGNOSIS — R338 Other retention of urine: Secondary | ICD-10-CM | POA: Diagnosis not present

## 2022-01-23 LAB — COMPREHENSIVE METABOLIC PANEL
ALT: 29 U/L (ref 0–44)
AST: 37 U/L (ref 15–41)
Albumin: 3.8 g/dL (ref 3.5–5.0)
Alkaline Phosphatase: 61 U/L (ref 38–126)
Anion gap: 9 (ref 5–15)
BUN: 13 mg/dL (ref 6–20)
CO2: 25 mmol/L (ref 22–32)
Calcium: 8.9 mg/dL (ref 8.9–10.3)
Chloride: 99 mmol/L (ref 98–111)
Creatinine, Ser: 0.81 mg/dL (ref 0.61–1.24)
GFR, Estimated: >60 mL/min (ref >60)
Glucose, Bld: 96 mg/dL (ref 70–99)
Potassium: 4.1 mmol/L (ref 3.5–5.1)
Sodium: 133 mmol/L — ABNORMAL LOW (ref 135–145)
Total Bilirubin: 1 mg/dL (ref 0.3–1.2)
Total Protein: 7 g/dL (ref 6.5–8.1)

## 2022-01-23 LAB — CBC
HCT: 37 % — ABNORMAL LOW (ref 39.0–52.0)
Hemoglobin: 12 g/dL — ABNORMAL LOW (ref 13.0–17.0)
MCH: 28.4 pg (ref 26.0–34.0)
MCHC: 32.4 g/dL (ref 30.0–36.0)
MCV: 87.7 fL (ref 80.0–100.0)
Platelets: 127 10*3/uL — ABNORMAL LOW (ref 150–400)
RBC: 4.22 MIL/uL (ref 4.22–5.81)
RDW: 17.2 % — ABNORMAL HIGH (ref 11.5–15.5)
WBC: 8.5 10*3/uL (ref 4.0–10.5)
nRBC: 0 % (ref 0.0–0.2)

## 2022-01-23 LAB — PROTIME-INR
INR: 1.1 (ref 0.8–1.2)
Prothrombin Time: 14.5 seconds (ref 11.4–15.2)

## 2022-01-23 LAB — AMMONIA: Ammonia: 33 umol/L (ref 9–35)

## 2022-01-23 MED ORDER — PANTOPRAZOLE SODIUM 40 MG PO TBEC: 40.0000 mg | DELAYED_RELEASE_TABLET | Freq: Two times a day (BID) | ORAL | Status: DC

## 2022-01-23 MED ORDER — SODIUM CHLORIDE 0.9 % IV SOLN
0.5000 mg/kg/h | INTRAVENOUS | Status: DC
  Administered 2022-01-23 – 2022-01-24 (×3): 0.2 mg/kg/h via INTRAVENOUS
  Administered 2022-01-24: 1.5 mg/kg/h via INTRAVENOUS
  Administered 2022-01-25: 0.5 mg/kg/h via INTRAVENOUS
  Administered 2022-01-25 (×2): 1.5 mg/kg/h via INTRAVENOUS
  Filled 2022-01-23 (×8): qty 5

## 2022-01-23 MED ORDER — DEXMEDETOMIDINE HCL IN NACL 400 MCG/100ML IV SOLN
0.4000 ug/kg/h | INTRAVENOUS | Status: DC
  Administered 2022-01-24: 1.2 ug/kg/h via INTRAVENOUS
  Administered 2022-01-24: 0.8 ug/kg/h via INTRAVENOUS
  Administered 2022-01-24: 0.6 ug/kg/h via INTRAVENOUS
  Administered 2022-01-24: 0.7 ug/kg/h via INTRAVENOUS
  Administered 2022-01-25 (×2): 1.9 ug/kg/h via INTRAVENOUS
  Administered 2022-01-25 (×2): 2 ug/kg/h via INTRAVENOUS
  Administered 2022-01-25: 1.8 ug/kg/h via INTRAVENOUS
  Administered 2022-01-25 (×4): 2 ug/kg/h via INTRAVENOUS
  Administered 2022-01-26: 1.9 ug/kg/h via INTRAVENOUS
  Administered 2022-01-26: 1.8 ug/kg/h via INTRAVENOUS
  Administered 2022-01-26: 2 ug/kg/h via INTRAVENOUS
  Administered 2022-01-26: 1.7 ug/kg/h via INTRAVENOUS
  Administered 2022-01-26: 1.9 ug/kg/h via INTRAVENOUS
  Filled 2022-01-23 (×21): qty 100

## 2022-01-23 MED ORDER — LORAZEPAM 2 MG/ML IJ SOLN
1.0000 mg | INTRAMUSCULAR | Status: DC | PRN
  Administered 2022-01-23 – 2022-01-24 (×4): 2 mg via INTRAVENOUS
  Filled 2022-01-23 (×4): qty 1

## 2022-01-23 MED ORDER — PHENOBARBITAL SODIUM 65 MG/ML IJ SOLN
130.0000 mg | Freq: Three times a day (TID) | INTRAMUSCULAR | Status: DC
  Administered 2022-01-23 – 2022-01-24 (×4): 130 mg via INTRAVENOUS
  Filled 2022-01-23 (×7): qty 2

## 2022-01-23 MED ORDER — DEXMEDETOMIDINE HCL IN NACL 200 MCG/50ML IV SOLN
0.4000 ug/kg/h | INTRAVENOUS | Status: DC
  Administered 2022-01-23: 1 ug/kg/h via INTRAVENOUS
  Administered 2022-01-23: 1.2 ug/kg/h via INTRAVENOUS
  Administered 2022-01-23: 0.2 ug/kg/h via INTRAVENOUS
  Administered 2022-01-23: 1 ug/kg/h via INTRAVENOUS
  Filled 2022-01-23 (×3): qty 50
  Filled 2022-01-23: qty 100

## 2022-01-23 MED ORDER — SODIUM CHLORIDE 0.9 % IV SOLN: INTRAVENOUS | Status: DC

## 2022-01-23 MED ORDER — KETAMINE BOLUS VIA INFUSION
0.5000 mg/kg | Freq: Once | INTRAVENOUS | Status: AC
  Administered 2022-01-23: 54.45 mg via INTRAVENOUS
  Filled 2022-01-23: qty 55

## 2022-01-23 MED ORDER — PHENOBARBITAL SODIUM 130 MG/ML IJ SOLN
130.0000 mg | INTRAMUSCULAR | Status: DC | PRN
  Administered 2022-01-23: 130 mg via INTRAVENOUS
  Filled 2022-01-23 (×2): qty 1

## 2022-01-23 MED ORDER — SODIUM CHLORIDE 0.9 % IV SOLN
260.0000 mg | Freq: Once | INTRAVENOUS | Status: AC
  Administered 2022-01-23: 260 mg via INTRAVENOUS
  Filled 2022-01-23: qty 2

## 2022-01-23 NOTE — Assessment & Plan Note (Signed)
Meld score at this time is 7---1.9 mortality in 3 months.  ?He continue to drink alcohol, admitted for detox.  ?He will need to continue to follow up with GI/.  ?Palliative consulted for goals of care. Dr Hilma Favors has been involve in his care. She will follow in consultation.  ?

## 2022-01-23 NOTE — Assessment & Plan Note (Signed)
Nicotine patch ordered.

## 2022-01-23 NOTE — Consult Note (Addendum)
? ?NAME:  Levi Alvarez, MRN:  [id number], DOB:  Jun 10, 1971, LOS: 0 ?ADMISSION DATE:  01/22/2022, CONSULTATION DATE:  01/23/22 ?REFERRING MD:  Dr. Tyrell Antonio, CHIEF COMPLAINT:  ETOH Withdrawal   ? ?History of Present Illness:  ?51 y/o M who presented to [facility] ER on 3/13 for elective alcohol detox.  ? ?At baseline, he lives at home with his girlfriend [place].  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  He has a hx of known alcoholism and prior admissions for withdrawal that were severe/required precedex and ICU admission.  He has documented liver cirrhosis, esophageal varices with prior GIB.   ? ?He called Dr. Hilma Favors from [place] to assist with admission and withdrawal process.  They have a relationship from prior family members being in hospice. It was felt he was too complex for detox at [facility] and he was referred to the ER at [facility] for admission.  The patient presented on 3/13 with tremors, nausea and vomiting.  Last alcohol consumption on 3/12 evening.  He was admitted per [facility] for detox and treated with ativan / CIWA protocol.  On 3/14 the phenobarbital protocol was initiated.   ? ?PCCM consulted for evaluation of withdrawal, concern for precedex need.   ? ?Pertinent  Medical History  ?ETOH Abuse ?ETOH Cirrhosis  ?Esophageal Varices  ?GIB ?OSA  ?COPD  ?Tobacco Abuse - smokes 1/2ppd ? ?Significant Hospital Events: ?Including procedures, antibiotic start and stop dates in addition to other pertinent events   ?3/13 Admit for planned detox ?3/14 PCCM consulted for evaluation of withdrawal  ? ?Interim History / Subjective:  ?As above  ?Pt denies pain, SOB. Reports mild tremor and nausea ? ?Objective   ?Blood pressure (!) 157/109, pulse 72, temperature 98.6 ?F (37 ?C), temperature source Oral, resp. rate 20, height '5\' 10"'$  (1.778 m), weight 108.9 kg, SpO2 96 %. ?   ?   ? ?Intake/Output Summary (Last 24 hours) at 01/23/2022 1406 ?Last data filed at 01/23/2022 1200 ?Gross per 24 hour  ?Intake 417.42 ml  ?Output  900 ml  ?Net -482.58 ml  ? ?Filed Weights  ? 01/22/22 1236  ?Weight: 108.9 kg  ? ? ?Examination: ?General: adult male lying in bed in NAD, girlfriend at bedside [name]) ?HENT: MM pink/moist, poor dentition, full beard, pupils equal/reactive  ?Lungs: non-labored at rest, lungs bilaterally distant but clear ?Cardiovascular: s1s2 RRR, SR 70's on monitor (on coreg) ?Abdomen: protuberant, soft, bsx4 active, tolerating PO's  ?Extremities: warm/dry, multiple tattoos ?Neuro: Awake, alert, speech clear, able to answer orientation questions regarding person, place, time, events appropriately.  However noted repetitive picking of items in bed, starring at light on finger probe etc, mild tremor in hands noted ? ?Resolved Hospital Problem list   ?Elevated LFT's  ? ?Assessment & Plan:  ? ?Acute Alcohol Withdrawal  ?Chronic ETOH Abuse  ?Admitted for elective detox.  Felt to be too complex medically for [facility].  Last drink 3/13 ~8pm.  ?-begin precedex IV for withdrawal symptoms  ?-PRN ativan Q1 hour for agitation or seizures  ?-follow clinical exam for evidence of withdrawal with CIWA scoring  ?-continue IVF  ?-tele monitoring  ?-thiamine, folate, MVI  ?-stop phenobarbital ? ?Acute Metabolic Encephalopathy  ?-supportive care ?-D2 of withdrawal, anticipate possible worsening of symptoms over next 24 -48 hours  ?-frequent reorientation, promote sleep/wake cycle  ? ?HTN  ?-continue coreg, cozaar  ?-monitor BP trend in ICU  ? ?Hyponatremia  ?-trend Na ? ?Tobacco Abuse  ?COPD without Acute Exacerbation  ?-  PRN albuterol  ?-smoking cessation counseling when able  ? ?Chronic Hepatitis C+ ?Not clear he has been treated  ?-will need outpatient GI/ID follow up  ? ?Anxiety, Depression  ?-continue effexor ? ? ?Best Practice (right click and "Reselect all SmartList Selections" daily)  ?Diet/type: Regular consistency (see orders) ?DVT prophylaxis: SCD ?GI prophylaxis: PPI ?Lines: N/A ?Foley:  N/A ?Code Status:  full code ?Last date of  multidisciplinary goals of care discussion: full code  ? ?Labs   ?CBC: ?Recent Labs  ?Lab 01/22/22 ?1417 01/23/22 ?0232  ?WBC 8.1 8.5  ?NEUTROABS 5.5  --   ?HGB 13.8 12.0*  ?HCT 42.0 37.0*  ?MCV 86.8 87.7  ?PLT 150 127*  ? ? ?Basic Metabolic Panel: ?Recent Labs  ?Lab 01/22/22 ?1417 01/22/22 ?1420 01/22/22 ?1430 01/23/22 ?0232  ?NA 137  --   --  133*  ?K 4.5  --   --  4.1  ?CL 99  --   --  99  ?CO2 27  --   --  25  ?GLUCOSE 115*  --   --  96  ?BUN 13  --   --  13  ?CREATININE 0.90  --   --  0.81  ?CALCIUM 9.7  --   --  8.9  ?MG  --  2.0  --   --   ?PHOS  --   --  2.9  --   ? ?GFR: ?Estimated Creatinine Clearance: 134.9 mL/min (by C-G formula based on SCr of 0.81 mg/dL). ?Recent Labs  ?Lab 01/22/22 ?1417 01/23/22 ?0232  ?WBC 8.1 8.5  ? ? ?Liver Function Tests: ?Recent Labs  ?Lab 01/22/22 ?1417 01/23/22 ?0232  ?AST 53* 37  ?ALT 38 29  ?ALKPHOS 67 61  ?BILITOT 1.0 1.0  ?PROT 8.8* 7.0  ?ALBUMIN 4.8 3.8  ? ?No results for input(s): LIPASE, AMYLASE in the last 168 hours. ?Recent Labs  ?Lab 01/23/22 ?0713  ?AMMONIA 33  ? ? ?ABG ?   ?Component Value Date/Time  ? PHART 7.348 (L) 04/19/2021 2356  ? PCO2ART 36.7 04/19/2021 2356  ? PO2ART 70 (L) 04/19/2021 2356  ? HCO3 20.3 04/19/2021 2356  ? TCO2 21 (L) 04/19/2021 2356  ? ACIDBASEDEF 5.0 (H) 04/19/2021 2356  ? O2SAT 94.0 04/19/2021 2356  ?  ? ?Coagulation Profile: ?Recent Labs  ?Lab 01/23/22 ?0713  ?INR 1.1  ? ? ?Cardiac Enzymes: ?No results for input(s): CKTOTAL, CKMB, CKMBINDEX, TROPONINI in the last 168 hours. ? ?HbA1C: ?Hgb A1c MFr Bld  ?Date/Time Value Ref Range Status  ?06/05/2017 11:29 AM 5.2 <5.7 % Final  ?  Comment:  ?    ?For the purpose of screening for the presence of diabetes: ?  ?<5.7%       Consistent with the absence of diabetes ?5.7-6.4 %   Consistent with increased risk for diabetes (prediabetes) ?>=6.5 %     Consistent with diabetes ?  ?This assay result is consistent with a decreased risk of diabetes. ?  ?Currently, no consensus exists regarding use of  hemoglobin A1c for ?diagnosis of diabetes in children. ?  ?According to American Diabetes Association (ADA) guidelines, ?hemoglobin A1c <7.0% represents optimal control in non-pregnant ?diabetic patients. Different metrics may apply to specific patient ?populations. Standards of Medical Care in Diabetes (ADA). ?  ?  ? ? ?CBG: ?No results for input(s): GLUCAP in the last 168 hours. ? ?Review of Systems:   ?Gen: Denies fever, chills, weight change, fatigue, night sweats ?HEENT: Denies blurred vision, double vision, hearing loss, tinnitus, sinus congestion,  rhinorrhea, sore throat, neck stiffness, dysphagia ?PULM: Denies shortness of breath, cough, sputum production, hemoptysis, wheezing ?CV: Denies chest pain, edema, orthopnea, paroxysmal nocturnal dyspnea, palpitations ?GI: Denies abdominal pain, nausea, vomiting, diarrhea, hematochezia, melena, constipation, change in bowel habits ?GU: Denies dysuria, hematuria, polyuria, oliguria, urethral discharge ?Endocrine: Denies hot or cold intolerance, polyuria, polyphagia or appetite change ?Derm: Denies rash, dry skin, scaling or peeling skin change ?Heme: Denies easy bruising, bleeding, bleeding gums ?Neuro: Denies headache, numbness, weakness, slurred speech, loss of memory or consciousness, shakiness/jittery  ? ?Past Medical History:  ?He,  has a past medical history of Alcoholism ([place]), Blood transfusion without reported diagnosis, Cirrhosis ([place]), Constipation, COPD (chronic obstructive pulmonary disease) ([place]), DDD (degenerative disc disease), cervical, ED (erectile dysfunction), Esophageal varices ([place]), GERD (gastroesophageal reflux disease), H/O: upper GI bleed, Hepatitis C antibody positive in blood, High blood pressure, History of anemia, Left inguinal hernia (2020), Lung nodule, Sleep apnea, and Umbilical hernia (7628).  ? ?Surgical History:  ? ?Past Surgical History:  ?Procedure Laterality Date  ? arm surgery    ? left arm,  ? COLONOSCOPY WITH PROPOFOL N/A  01/19/2019  ? Procedure: COLONOSCOPY WITH PROPOFOL;  Surgeon: Yetta Flock, MD;  Location: [facility];  Service: Gastroenterology;  Laterality: N/A;  ? ESOPHAGEAL BANDING  11/25/2018  ? Procedure: ESOPHAGEAL [place]

## 2022-01-23 NOTE — Progress Notes (Addendum)
?  Progress Note ? ? ?Patient: Levi Alvarez GNF:[id number] DOB: 1971-10-20 DOA: 01/22/2022     0 ?DOS: the patient was seen and examined on 01/23/2022 ?  ?Brief hospital course: ?51 year old with past medical history significant for chronic hepatitis C, alcoholism, liver cirrhosis, esophageal varices, history of upper GI bleed, sleep apnea, COPD, who presents to the ED for further evaluation of alcohol withdrawal detox.  Patient presents with tremors, nausea and vomiting.  Patient drinks 20 to 2412 ounces cans of beer a day.  He has a prior history of severe alcohol withdrawal requiring pressor and phenobarbital in the past.  He was referred for admission by Dr. Donovan Kail. ? ?Patient will be loaded with phenobarbital, continue with Ativan.  Will ask CCM for consult, concern patient will required precedex gtt.  ? ?Assessment and Plan: ?* Alcohol withdrawal ([place]) ?-Patient presents for alcohol detox in alcohol withdrawal.  ?-Last alcoholic drink; 8/46 at  9 PM.  ?-Continue with CIWA/ Ativan Protocol.  ?-Discussed with pharmacist-- will proceed with phenobarbital loading dose this am. Then PRN phenobarbital.  ?Continue with Thiamine and folic acid./  ?CIWA Score this AM; 15-- he will received IV ativan.  ?CCM consulted, patient high risk to required precedex gtt.  ? ?Hyponatremia ?Mild, started on IV fluids.  ? ?Class 1 obesity ?Needs life styl modification.  ? ?Chronic obstructive pulmonary disease ([place]) ?Continue with Albuterol PRN ? ?Cirrhosis with alcoholism ([place]) ?Meld score at this time is 7---1.9 mortality in 3 months.  ?He continue to drink alcohol, admitted for detox.  ?He will need to continue to follow up with GI/.  ?Palliative consulted for goals of care. Dr Hilma Favors has been involve in his care. She will follow in consultation.  ? ?Chronic hepatitis C without hepatic coma ([place]) ?Needs to follow up with GI out patient.  ? ?Smoker ?Nicotine patch ordered.  ? ?Elevated liver enzymes ?LFT normalized.  ?In setting  alcohol use.  ? ?Essential hypertension ?Continue with Carvedilol and Cozaar.  ? ? ? ? ?  ? ?Subjective:  ?He is alert and oriented, feels anxious, report nausea, and upset stomach.  ? ? ?Physical Exam: ?Vitals:  ? 01/23/22 1200 01/23/22 1300 01/23/22 1400 01/23/22 1449  ?BP: (!) 140/92 (!) 157/109    ?Pulse: 64 72 71 73  ?Resp: 20 20    ?Temp:      ?TempSrc:      ?SpO2: 96% 96%    ?Weight:      ?Height:      ? ?General; alert, flat affect ?CVS; S 1, S 2 RRR ?Lungs CTA ?Abdomen; soft, nt.  ?Neuro; alert and oriented, follows command.  ? ?Data Reviewed: ? ?Cmet, cbc reviewed.  ? ?Family Communication:  ? ?Disposition: ?Status is: Inpatient ?Remains inpatient appropriate because: remain for alcohol withdrawal treatment.  ? Planned Discharge Destination: Home ? ? ? ?Time spent: 45 minutes ? ?Author: ?Elmarie Shiley, MD ?01/23/2022 3:23 PM ? ?For on call review [email or web address].  ?

## 2022-01-23 NOTE — Progress Notes (Signed)
Patient asleep and noted to be obstructing. Repositioned in bed by RN to decrease obstruction. CPAP on standby not placed at this time due to safety as patient is restrained at this time. RT will continue to follow and place CPAP as able.  ?

## 2022-01-23 NOTE — Assessment & Plan Note (Signed)
Needs to follow up with GI out patient.  ?

## 2022-01-23 NOTE — Progress Notes (Signed)
Respiratory called regarding CPAP order.  ? ?VSS, will continue to monitor.  ?

## 2022-01-23 NOTE — Assessment & Plan Note (Signed)
LFT normalized.  ?In setting alcohol use.  ?

## 2022-01-23 NOTE — Hospital Course (Addendum)
51 year old with past medical history significant for chronic hepatitis C, alcoholism, liver cirrhosis, esophageal varices, history of upper GI bleed, sleep apnea, COPD, who presents to the ED for further evaluation of alcohol withdrawal detox.  Patient presents with tremors, nausea and vomiting.  Patient drinks 20 to 2412 ounces cans of beer a day.  He has a prior history of severe alcohol withdrawal requiring pressor and phenobarbital in the past.  He was referred for admission by Dr. Donovan Kail. ? ?Patient will be loaded with phenobarbital, continue with Ativan.  Will ask CCM for consult, concern patient will required precedex gtt.  ?

## 2022-01-23 NOTE — Progress Notes (Signed)
Pt placed on cpap 

## 2022-01-23 NOTE — Assessment & Plan Note (Addendum)
-  Patient presents for alcohol detox in alcohol withdrawal.  ?-Last alcoholic drink; 3/76 at  9 PM.  ?-Continue with CIWA/ Ativan Protocol.  ?-Discussed with pharmacist-- will proceed with phenobarbital loading dose this am. Then PRN phenobarbital.  ?Continue with Thiamine and folic acid./  ?CIWA Score this AM; 15-- he will received IV ativan.  ?CCM consulted, patient high risk to required precedex gtt.  ?

## 2022-01-23 NOTE — Assessment & Plan Note (Signed)
Mild, started on IV fluids.  ?

## 2022-01-23 NOTE — Assessment & Plan Note (Signed)
Needs life styl modification.  ?

## 2022-01-23 NOTE — Assessment & Plan Note (Signed)
Continue with Carvedilol and Cozaar.  ?

## 2022-01-23 NOTE — Assessment & Plan Note (Signed)
Continue with Albuterol PRN ?

## 2022-01-23 NOTE — Progress Notes (Signed)
Palliative care consult for goals of care and symptom management. Patient expressed a desire to discontinue use of alcohol to improve his QOL and give him length of life in the setting of advanced alcohol induced liver disease. He is admitted with complicated, benzodiazepine resistant alcohol withdrawal (AW). He is extremely agitated now at day 3 of detox. He is confused and hallucinating. He is maxed out on precedex drip. Earlier today  he received an Iv load of phenobarb and has had multiple prn Ativan doses.  ? ?Recommendations: ?1. Transitional CIWA approach will not be effective for this patient given his past admission with life threatening AW. ?2. Use maximum doses of Phenobarbital and minimize use of benzodiazepines.  ?-schedule phenobarbital '130mg'$  IV 3xday scheduled ?- will wean phenobarb once stable and through withdrawal peak. ?use IV Ativan 61mg as a PRN q3hrs ?3 patient is maxed out on precedex - didn?t make much of a difference in his symptoms  ?4. Start Ketamine 0.'2mg'$ /kg/he with 0.5 bolus  ?Given refractory agitation and severity / and that he wants to avoid use of restraints ketamine will be a good choice for sedation - may also have long lasting impact on his [place] alcohol use. ?5. Psych consult - will need intensive OP treatment vs. residential treatment. ?

## 2022-01-23 NOTE — TOC Initial Note (Signed)
Transition of Care (TOC) - Initial/Assessment Note  ? ?Patient Details  ?Name: Levi Alvarez ?MRN: [id number] ?Date of Birth: May 14, 1971 ? ?Transition of Care (TOC) CM/SW Contact:    ?Sherie Don, LCSW ?Phone Number: ?01/23/2022, 12:34 PM ? ?Clinical Narrative: TOC consulted for substance use resources due to ETOH use. Resources added to AVS. ? ?Expected Discharge Plan: Home/Self Care ?Barriers to Discharge: Continued Medical Work up ? ?Patient Goals and CMS Choice ?Choice offered to / list presented to : NA ? ?Expected Discharge Plan and Services ?Expected Discharge Plan: Home/Self Care ?In-house Referral: Clinical Social Work ?Post Acute Care Choice: NA ?Living arrangements for the past 2 months: [place]           ?DME Arranged: N/A ?DME Agency: NA ? ?Prior Living Arrangements/Services ?Living arrangements for the past 2 months: [place] ?Patient language and need for interpreter reviewed:: Yes ?Need for Family Participation in Patient Care: No (Comment) ?Care giver support system in place?: Yes (comment) ?Criminal Activity/Legal Involvement Pertinent to Current Situation/Hospitalization: No - Comment as needed ? ?Emotional Assessment ?Orientation: : Oriented to Self, Oriented to Place, Oriented to  Time, Oriented to Situation ?Alcohol / Substance Use: Alcohol Use ?Psych Involvement: No (comment) ? ?Admission diagnosis:  Alcohol withdrawal ([place]) [F10.939] ?Patient Active Problem List  ? Diagnosis Date Noted  ? Alcohol withdrawal ([place]) 01/22/2022  ? Class 1 obesity 01/22/2022  ? Alcohol withdrawal syndrome without complication ([place])   ? Alcohol dependence with withdrawal with complication ([place]) 23/30/0762  ? Marijuana abuse, continuous 04/20/2021  ? Pancreatitis, acute 04/10/2021  ? Hypotension 04/10/2021  ? Chest pain 04/10/2021  ? Esophageal varices without bleeding ([place])   ? Abnormal CT scan, colon   ? Benign neoplasm of colon   ? Bleeding esophageal varices ([facility])   ? Spondylosis without  myelopathy or radiculopathy, cervical region 12/03/2018  ? 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  ? Post-traumatic osteoarthritis of left knee 12/03/2018  ? Chronic obstructive pulmonary disease ([place]) 12/02/2018  ? Upper GI bleed 11/24/2018  ? Acute blood loss anemia 11/24/2018  ? Alcohol abuse 11/24/2018  ? Chronic back pain 11/24/2018  ? Chronic hepatitis C without hepatic coma ([place]) 10/08/2017  ? Cirrhosis ([place]) 10/08/2017  ? Chronic pain of left knee 07/23/2017  ? Essential hypertension 07/12/2017  ? OSA (obstructive sleep apnea) 07/12/2017  ? Class 2 obesity due to excess calories with body mass index (BMI) of 35.0 to 35.9 in adult 07/12/2017  ? Elevated liver enzymes 07/12/2017  ? Other chronic pain 07/12/2017  ? Smoker 07/12/2017  ? Hyperlipidemia 07/12/2017  ? Polyarthralgia 07/12/2017  ? Snoring 06/05/2017  ? ?PCP:  Sonia Side., FNP ?Pharmacy:   ?[id number] - [place], [address] ?[address] ?[place] [address] ?Phone: [phone] Fax: [phone] ? ?Readmission Risk Interventions ?No flowsheet data found. ? ?

## 2022-01-24 DIAGNOSIS — F10931 Alcohol use, unspecified with withdrawal delirium: Secondary | ICD-10-CM | POA: Diagnosis not present

## 2022-01-24 LAB — BASIC METABOLIC PANEL
Anion gap: 9 (ref 5–15)
BUN: 11 mg/dL (ref 6–20)
CO2: 22 mmol/L (ref 22–32)
Calcium: 9 mg/dL (ref 8.9–10.3)
Chloride: 103 mmol/L (ref 98–111)
Creatinine, Ser: 0.65 mg/dL (ref 0.61–1.24)
GFR, Estimated: >60 mL/min (ref >60)
Glucose, Bld: 125 mg/dL — ABNORMAL HIGH (ref 70–99)
Potassium: 4.1 mmol/L (ref 3.5–5.1)
Sodium: 134 mmol/L — ABNORMAL LOW (ref 135–145)

## 2022-01-24 LAB — CBC
HCT: 39.3 % (ref 39.0–52.0)
Hemoglobin: 12.8 g/dL — ABNORMAL LOW (ref 13.0–17.0)
MCH: 28.6 pg (ref 26.0–34.0)
MCHC: 32.6 g/dL (ref 30.0–36.0)
MCV: 87.7 fL (ref 80.0–100.0)
Platelets: 127 10*3/uL — ABNORMAL LOW (ref 150–400)
RBC: 4.48 MIL/uL (ref 4.22–5.81)
RDW: 16.8 % — ABNORMAL HIGH (ref 11.5–15.5)
WBC: 8.7 10*3/uL (ref 4.0–10.5)
nRBC: 0 % (ref 0.0–0.2)

## 2022-01-24 MED ORDER — KETAMINE BOLUS VIA INFUSION
0.5000 mg/kg | Freq: Once | INTRAVENOUS | Status: AC
  Administered 2022-01-24: 54.45 mg via INTRAVENOUS
  Filled 2022-01-24: qty 55

## 2022-01-24 MED ORDER — POLYETHYLENE GLYCOL 3350 17 G PO PACK
17.0000 g | PACK | Freq: Every day | ORAL | Status: DC
  Administered 2022-01-28: 17 g via ORAL
  Filled 2022-01-24 (×3): qty 1

## 2022-01-24 MED ORDER — FOLIC ACID 1 MG PO TABS
1.0000 mg | ORAL_TABLET | Freq: Every day | ORAL | Status: DC
  Administered 2022-01-27 – 2022-01-29 (×3): 1 mg via ORAL
  Filled 2022-01-24 (×3): qty 1

## 2022-01-24 MED ORDER — ENOXAPARIN SODIUM 40 MG/0.4ML IJ SOSY
40.0000 mg | PREFILLED_SYRINGE | Freq: Every day | INTRAMUSCULAR | Status: DC
Start: 2022-01-24 — End: 2022-01-29
  Administered 2022-01-24 – 2022-01-29 (×6): 40 mg via SUBCUTANEOUS
  Filled 2022-01-24 (×6): qty 0.4

## 2022-01-24 MED ORDER — PHENOBARBITAL SODIUM 130 MG/ML IJ SOLN
130.0000 mg | INTRAMUSCULAR | Status: DC | PRN
  Administered 2022-01-24 – 2022-01-25 (×3): 130 mg via INTRAVENOUS
  Filled 2022-01-24: qty 1

## 2022-01-24 MED ORDER — THIAMINE HCL 100 MG/ML IJ SOLN: 100.0000 mg | Freq: Every day | INTRAMUSCULAR | Status: DC

## 2022-01-24 MED ORDER — RIFAXIMIN 200 MG PO TABS
200.0000 mg | ORAL_TABLET | Freq: Two times a day (BID) | ORAL | Status: DC
  Filled 2022-01-24 (×2): qty 1

## 2022-01-24 MED ORDER — SODIUM CHLORIDE 0.9 % IV SOLN
1.0000 mg | Freq: Every day | INTRAVENOUS | Status: DC
  Administered 2022-01-24 – 2022-01-26 (×3): 1 mg via INTRAVENOUS
  Filled 2022-01-24 (×4): qty 0.2

## 2022-01-24 MED ORDER — DEXTROSE-NACL 5-0.9 % IV SOLN: INTRAVENOUS | Status: DC

## 2022-01-24 MED ORDER — PHENOBARBITAL SODIUM 130 MG/ML IJ SOLN
130.0000 mg | Freq: Once | INTRAMUSCULAR | Status: DC
  Filled 2022-01-24: qty 1

## 2022-01-24 MED ORDER — MAGNESIUM SULFATE 2 GM/50ML IV SOLN
2.0000 g | Freq: Once | INTRAVENOUS | Status: AC
  Administered 2022-01-24: 2 g via INTRAVENOUS
  Filled 2022-01-24: qty 50

## 2022-01-24 MED ORDER — PANTOPRAZOLE SODIUM 40 MG IV SOLR
40.0000 mg | Freq: Every day | INTRAVENOUS | Status: DC
  Administered 2022-01-24 – 2022-01-25 (×2): 40 mg via INTRAVENOUS
  Filled 2022-01-24 (×2): qty 10

## 2022-01-24 MED ORDER — HYDRALAZINE HCL 20 MG/ML IJ SOLN
5.0000 mg | INTRAMUSCULAR | Status: DC | PRN
  Administered 2022-01-25 (×2): 5 mg via INTRAVENOUS
  Filled 2022-01-24 (×2): qty 1

## 2022-01-24 MED ORDER — HALOPERIDOL LACTATE 5 MG/ML IJ SOLN
2.5000 mg | Freq: Four times a day (QID) | INTRAMUSCULAR | Status: DC | PRN
  Administered 2022-01-24 – 2022-01-25 (×2): 2.5 mg via INTRAVENOUS
  Filled 2022-01-24 (×4): qty 1

## 2022-01-24 NOTE — Progress Notes (Signed)
Palliative Care ?Progress Notes ? ?Much improved in terms of agitation. He was able to have a coherent conversation with me today. He also recognized me and could tell me why he was in the hospital. Some visual hallucinations but he was able to know they were not real. ? ?Currently on Ketamine at 0.'2mg'$ /kg/hr, Precedex is at.8 and he is getting scheduled phenobarbital '130mg'$  IV TID. Has not received any ativan. ? ?Monitor for issues related to urinary retention. ? ?Continue sedation at current level will start to wean off sedation tomorrow as tolerated. ? ?Updated his wife by phone and communicated with bedside RN re: symptom management. ? ?Lane Hacker, DO ?Palliative Medicine ? ?Time: 50 minutes ? ? ?

## 2022-01-24 NOTE — Progress Notes (Signed)
Unable to assess CIWA due to drowsiness/fatigue. Patient awakens to voice, has eye contact, but does not follow commands.  ? ?Will wean Precedex as tolerated and reassess CIWA appropriately.  ? ?VSS, will continue to monitor. ?

## 2022-01-24 NOTE — Progress Notes (Addendum)
? ?NAME:  BRADEN CIMO, MRN:  [id number], DOB:  1971-01-27, LOS: 1 ?ADMISSION DATE:  01/22/2022, CONSULTATION DATE:  01/23/22 ?REFERRING MD:  Dr. Tyrell Antonio, CHIEF COMPLAINT:  ETOH Withdrawal   ? ?History of Present Illness:  ?51 y/o M who presented to [facility] ER on 3/13 for elective alcohol detox.  ? ?At baseline, he lives at home with his girlfriend [place].  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  He has a hx of known alcoholism and prior admissions for withdrawal that were severe/required precedex and ICU admission.  He has documented liver cirrhosis, esophageal varices with prior GIB.   ? ?He called Dr. Hilma Favors from [place] to assist with admission and withdrawal process.  They have a relationship from prior family members being in hospice. It was felt he was too complex for detox at [facility] and he was referred to the ER at [facility] for admission.  The patient presented on 3/13 with tremors, nausea and vomiting.  Last alcohol consumption on 3/12 evening.  He was admitted per [facility] for detox and treated with ativan / CIWA protocol.  On 3/14 the phenobarbital protocol was initiated.   ? ?PCCM consulted for evaluation of withdrawal, concern for precedex need.   ? ?Pertinent  Medical History  ?ETOH Abuse ?ETOH Cirrhosis  ?Esophageal Varices  ?GIB ?OSA  ?COPD  ?Tobacco Abuse - smokes 1/2ppd ? ?Significant Hospital Events: ?Including procedures, antibiotic start and stop dates in addition to other pertinent events   ?3/13 Admit for planned detox ?3/14 PCCM consulted for evaluation of withdrawal. Precedex, phenobarbital, ketamine ? ?Interim History / Subjective:  ?Increased agitation, max on precedex, phenobarbital added, ketamine infusion added  ?On RA ?I/O 1.2L UOP, +938m in last 24 hours  ? ?Objective   ?Blood pressure (!) 152/101, pulse 60, temperature 98.3 ?F (36.8 ?C), temperature source Axillary, resp. rate (!) 21, height '5\' 10"'$  (1.778 m), weight 108.9 kg, SpO2 98 %. ?   ?   ? ?Intake/Output Summary  (Last 24 hours) at 01/24/2022 01540?Last data filed at 01/24/2022 0630 ?Gross per 24 hour  ?Intake 2118.76 ml  ?Output 1200 ml  ?Net 918.76 ml  ? ?Filed Weights  ? 01/22/22 1236  ?Weight: 108.9 kg  ? ? ?Examination: ?General:  adult male lying in bed in NAD, resting comfortably ?HEENT: MM pink/dry, large beard, anicteric  ?Neuro: drowsy, awakens to voice, speech clear but brief / drifts back to sleep  ?CV: s1s2 RRR, no m/r/g ?PULM: non-labored at rest, lungs bilaterally clear, when sleeping does have periods of snoring / apnea  ?GI: soft, bsx4 active  ?Extremities: warm/dry, no edema  ?Skin: no rashes or lesions ? ?Resolved Hospital Problem list   ?Elevated LFT's  ? ?Assessment & Plan:  ? ?Acute Alcohol Withdrawal  ?Chronic ETOH Abuse  ?Admitted for elective detox.  [facility]  Last drink 3/13 ~8pm.  ?-continue precedex, ok to wean  ?-continue ketamine at 0.'2mg'$ /kg/hr > comfortable at current dose  ?-phenobarital TID  ?-PRN ativan for seizure  ?-continue IVF, thiamine, folate, MVI  ?-expect today will be a "reset" day of rest for him, consider weaning precedex in am  ? ?Acute Metabolic Encephalopathy  ?-supportive care  ?-frequent reorientation  ?-promote sleep / wake cycle  ? ?HTN  ?-coreg, cozaar ?-follow BP trend ? ?Hyponatremia  ?-monitor trend ? ?Tobacco Abuse  ?COPD without Acute Exacerbation  ?-PRN albuterol  ?-smoking cessation counseling when pt able to participate  ?-nicotine  patch  ? ?OSA ?-CPAP QHS, hold for 3/15 given sedation. Resume when more alert.  ?  ?Chronic Hepatitis C+ ?Not clear he has been treated  ?-outpatient GI/ID follow up  ? ?Anxiety, Depression  ?-effexor  ? ? ?Best Practice (right click and "Reselect all SmartList Selections" daily)  ?Diet/type: Regular consistency (see orders) ?DVT prophylaxis: SCD ?GI prophylaxis: PPI ?Lines: N/A ?Foley:  N/A ?Code Status:  full code ?Last date of multidisciplinary goals of care discussion: full code.  Wife updated at  bedside 3/15 on plan of care ? ?Critical care time: 32 minutes ?  ? ?Noe Gens, MSN, APRN, NP-C, AGACNP-BC ?[facility] ?01/24/2022, 7:12 AM ? ? ?Please see [email or web address] for pager details.  ? ?From 7A-7P if no response, please call [phone] ?After hours, please call [place]acy [phone] ? ? ? ? ?

## 2022-01-24 NOTE — Progress Notes (Signed)
Patient continues to be agitated/restless/paranoid/hallucinating, see new orders from [place].  ?
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
## 2022-01-24 NOTE — Progress Notes (Signed)
eLink Physician-Brief Progress Note ?Patient Name: Levi Alvarez ?DOB: [birth year] ?MRN: [id number] ? ? ?Date of Service ? 01/24/2022  ?HPI/Events of Note ? Severe agitated ETOH delirium with violent outbursts, he kicked and bit a bedside nurse despite being on Precedex and Ketamine gtt.  ?eICU Interventions ? 4 point soft restraints ordered. Hourly neuro-checks ordered.  ? ? ? ?Intervention Category ?Major Interventions: Delirium, psychosis, severe agitation - evaluation and management ? ?Frederik Pear ?01/24/2022, 10:29 PM ?

## 2022-01-24 NOTE — Progress Notes (Signed)
CPAP held tonight. Patient remains in restrains.  ?

## 2022-01-24 NOTE — Progress Notes (Signed)
Patient continues being agitated. He attempts to chew his restraints, while somehow reaching for his telemetry lines, and pulling at the IV lines. He has removed the PIV which was in the LFA. ? ? ?He does not follow commands, is oriented only to self.  ? ? ?VSS, will continue to monitor.  ?

## 2022-01-25 LAB — COMPREHENSIVE METABOLIC PANEL
ALT: 34 U/L (ref 0–44)
AST: 40 U/L (ref 15–41)
Albumin: 4 g/dL (ref 3.5–5.0)
Alkaline Phosphatase: 76 U/L (ref 38–126)
Anion gap: 9 (ref 5–15)
BUN: 9 mg/dL (ref 6–20)
CO2: 22 mmol/L (ref 22–32)
Calcium: 8.8 mg/dL — ABNORMAL LOW (ref 8.9–10.3)
Chloride: 102 mmol/L (ref 98–111)
Creatinine, Ser: 1.01 mg/dL (ref 0.61–1.24)
GFR, Estimated: >60 mL/min (ref >60)
Glucose, Bld: 107 mg/dL — ABNORMAL HIGH (ref 70–99)
Potassium: 4 mmol/L (ref 3.5–5.1)
Sodium: 133 mmol/L — ABNORMAL LOW (ref 135–145)
Total Bilirubin: 0.8 mg/dL (ref 0.3–1.2)
Total Protein: 7.5 g/dL (ref 6.5–8.1)

## 2022-01-25 LAB — CBC
HCT: 40.6 % (ref 39.0–52.0)
Hemoglobin: 13.3 g/dL (ref 13.0–17.0)
MCH: 28.5 pg (ref 26.0–34.0)
MCHC: 32.8 g/dL (ref 30.0–36.0)
MCV: 87.1 fL (ref 80.0–100.0)
Platelets: 122 10*3/uL — ABNORMAL LOW (ref 150–400)
RBC: 4.66 MIL/uL (ref 4.22–5.81)
RDW: 16.7 % — ABNORMAL HIGH (ref 11.5–15.5)
WBC: 9.8 10*3/uL (ref 4.0–10.5)
nRBC: 0 % (ref 0.0–0.2)

## 2022-01-25 LAB — GLUCOSE, CAPILLARY
Glucose-Capillary: 118 mg/dL — ABNORMAL HIGH (ref 70–99)
Glucose-Capillary: 98 mg/dL (ref 70–99)
Glucose-Capillary: 113 mg/dL — ABNORMAL HIGH (ref 70–99)

## 2022-01-25 LAB — AMMONIA: Ammonia: 25 umol/L (ref 9–35)

## 2022-01-25 LAB — MAGNESIUM: Magnesium: 1.9 mg/dL (ref 1.7–2.4)

## 2022-01-25 LAB — PHOSPHORUS: Phosphorus: 2.5 mg/dL (ref 2.5–4.6)

## 2022-01-25 MED ORDER — RIFAXIMIN 550 MG PO TABS
550.0000 mg | ORAL_TABLET | Freq: Two times a day (BID) | ORAL | Status: DC
  Administered 2022-01-26 – 2022-01-29 (×6): 550 mg via ORAL
  Filled 2022-01-25 (×8): qty 1

## 2022-01-25 MED ORDER — CHLORHEXIDINE GLUCONATE 0.12 % MT SOLN
15.0000 mL | Freq: Two times a day (BID) | OROMUCOSAL | Status: DC
  Administered 2022-01-25 (×2): 15 mL via OROMUCOSAL
  Filled 2022-01-25: qty 15

## 2022-01-25 MED ORDER — METOCLOPRAMIDE HCL 5 MG/ML IJ SOLN: 5.0000 mg | Freq: Four times a day (QID) | INTRAMUSCULAR | Status: DC | PRN

## 2022-01-25 MED ORDER — GLYCOPYRROLATE 0.2 MG/ML IJ SOLN
0.2000 mg | INTRAMUSCULAR | Status: DC | PRN
  Administered 2022-01-25: 0.2 mg via INTRAVENOUS
  Filled 2022-01-25 (×2): qty 1

## 2022-01-25 MED ORDER — SODIUM CHLORIDE 0.9 % IV SOLN
1.0000 mg/kg/h | INTRAVENOUS | Status: DC
  Administered 2022-01-25: 1.5 mg/kg/h via INTRAVENOUS
  Administered 2022-01-26: 1 mg/kg/h via INTRAVENOUS
  Filled 2022-01-25 (×3): qty 12.5

## 2022-01-25 MED ORDER — PHENOBARBITAL SODIUM 130 MG/ML IJ SOLN
130.0000 mg | Freq: Three times a day (TID) | INTRAMUSCULAR | Status: DC
  Administered 2022-01-25: 130 mg via INTRAVENOUS
  Filled 2022-01-25: qty 1

## 2022-01-25 MED ORDER — PHENOBARBITAL SODIUM 130 MG/ML IJ SOLN
130.0000 mg | INTRAMUSCULAR | Status: DC | PRN
  Administered 2022-01-25 – 2022-01-27 (×9): 130 mg via INTRAVENOUS
  Filled 2022-01-25 (×8): qty 1

## 2022-01-25 MED ORDER — BISACODYL 10 MG RE SUPP
10.0000 mg | Freq: Once | RECTAL | Status: AC
  Administered 2022-01-25: 10 mg via RECTAL
  Filled 2022-01-25: qty 1

## 2022-01-25 MED ORDER — PANTOPRAZOLE SODIUM 40 MG IV SOLR
40.0000 mg | Freq: Two times a day (BID) | INTRAVENOUS | Status: DC
  Administered 2022-01-25 – 2022-01-27 (×4): 40 mg via INTRAVENOUS
  Filled 2022-01-25 (×4): qty 10

## 2022-01-25 MED ORDER — KETAMINE BOLUS VIA INFUSION
0.5000 mg/kg | Freq: Once | INTRAVENOUS | Status: AC
  Administered 2022-01-25: 54.45 mg via INTRAVENOUS
  Filled 2022-01-25: qty 55

## 2022-01-25 MED ORDER — PHENOBARBITAL SODIUM 65 MG/ML IJ SOLN
65.0000 mg | Freq: Three times a day (TID) | INTRAMUSCULAR | Status: DC
  Administered 2022-01-25 – 2022-01-26 (×4): 65 mg via INTRAVENOUS
  Filled 2022-01-25 (×6): qty 1

## 2022-01-25 MED ORDER — ORAL CARE MOUTH RINSE
15.0000 mL | Freq: Two times a day (BID) | OROMUCOSAL | Status: DC
  Administered 2022-01-25 (×2): 15 mL via OROMUCOSAL

## 2022-01-25 MED ORDER — HYDRALAZINE HCL 20 MG/ML IJ SOLN: 10.0000 mg | INTRAMUSCULAR | Status: DC | PRN

## 2022-01-25 NOTE — Progress Notes (Signed)
? ?NAME:  ALEXANDE SHEERIN, MRN:  [id number], DOB:  23-Dec-1970, LOS: 2 ?ADMISSION DATE:  01/22/2022, CONSULTATION DATE:  01/23/22 ?REFERRING MD:  Dr. Tyrell Antonio, CHIEF COMPLAINT:  ETOH Withdrawal   ? ?History of Present Illness:  ?51 y/o M who presented to [facility] ER on 3/13 for elective alcohol detox.  ? ?At baseline, he lives at home with his girlfriend [place].  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  He has a hx of known alcoholism and prior admissions for withdrawal that were severe/required precedex and ICU admission.  He has documented liver cirrhosis, esophageal varices with prior GIB.   ? ?He called Dr. Hilma Favors from [place] to assist with admission and withdrawal process.  They have a relationship from prior family members being in hospice. It was felt he was too complex for detox at [facility] and he was referred to the ER at [facility] for admission.  The patient presented on 3/13 with tremors, nausea and vomiting.  Last alcohol consumption on 3/12 evening.  He was admitted per [facility] for detox and treated with ativan / CIWA protocol.  On 3/14 the phenobarbital protocol was initiated.   ? ?PCCM consulted for evaluation of withdrawal, concern for precedex need.   ? ?Pertinent  Medical History  ?ETOH Abuse ?ETOH Cirrhosis  ?Esophageal Varices  ?GIB ?OSA  ?COPD  ?Tobacco Abuse - smokes 1/2ppd ? ?Significant Hospital Events: ?Including procedures, antibiotic start and stop dates in addition to other pertinent events   ?3/13 Admit for planned detox ?3/14 PCCM consulted for evaluation of withdrawal. Precedex, phenobarbital, ketamine ?3/15 Significantly agitated overnight requiring uptitration of Precedex, Ketamine, and PRN Haldol, Currently deeply sedated for comfortable   ? ?Interim History / Subjective:  ?Seen deeply sedated this AM but appears comfortable  ?Foley placed overnight due to retention  ? ?Objective   ?Blood pressure (!) 144/112, pulse 74, temperature 99.3 ?F (37.4 ?C), temperature source Axillary,  resp. rate (!) 21, height '5\' 10"'$  (1.778 m), weight 108.9 kg, SpO2 96 %. ?   ?   ? ?Intake/Output Summary (Last 24 hours) at 01/25/2022 0703 ?Last data filed at 01/25/2022 2992 ?Gross per 24 hour  ?Intake 2198.5 ml  ?Output 2900 ml  ?Net -701.5 ml  ? ? ?Filed Weights  ? 01/22/22 1236  ?Weight: 108.9 kg  ? ? ?Examination: ?General: Acute on chronically ill appearing middle aged male lying in bed in NAD ?HEENT: [place]/AT, MM pink/moist, PERRL,  ?Neuro: Deeply sedated on Precedex and Ketamine drip  ?CV: s1s2 regular rate and rhythm, no murmur, rubs, or gallops,  ?PULM:  Slight snoring respirations bilaterally, no increased work of breathing, no added breath sounds ?GI: soft, bowel sounds active in all 4 quadrants, non-tender, non-distended ?Extremities: warm/dry, no edema  ?Skin: no rashes or lesions ? ?Resolved Hospital Problem list   ?Elevated LFT's  ? ?Assessment & Plan:  ? ?Acute Alcohol Withdrawal  ?Chronic ETOH Abuse  ?-Admitted for elective detox.  Felt to be too complex medically for [facility].  Last drink 3/13 ~8pm.  ?P: ?Remains on Precedex, Ketamine, and shceduled Phenobarbital ?PRN Haldol added overnight  ?Supplement folic acid, thiamine, and multivitamin  ?Follow CIWA  ?Monitor for signs of seizures  ?Currently in 4 point restraints  ?Avoid benzodiazepines as it appears to worsen confusion  ? ?Acute Metabolic Encephalopathy  ?-In the setting of acute alcohol withdrawal  ?P: ?Maintain neuro protective measures ?Nutrition and bowel regiment  ?Seizure precautions  ?Aspirations precautions  ?Supportive care  ?Delirium precautions  ? ?  HTN  ?P: ?Continue Coreg and Cozaar  ?Continuous telemetry  ? ?Hyponatremia  ?P: ?Remains stable, trend  ? ?Tobacco Abuse  ?COPD without Acute Exacerbation  ?P: ?Continue BDs ?Continue nicotine patch  ?Cessation education when appropriate   ? ?OSA ?P: ?Resume home CPAP when mentation improved  ? ?Chronic Hepatitis C+ ?-Not clear he has been treated  ?P: ?Outpatient follow up  ? ?Anxiety,  Depression  ?P: ?Continue home Effexor when able to safely take orals  ? ? ?Best Practice (right click and "Reselect all SmartList Selections" daily)  ?Diet/type: Regular consistency (see orders) ?DVT prophylaxis: SCD ?GI prophylaxis: PPI ?Lines: N/A ?Foley:  N/A ?Code Status:  full code ?Last date of multidisciplinary goals of care discussion: full code.  Wife updated at bedside 3/15 on plan of care ? ?Critical care time: 38 minutes  ?Sterling Ucci D. Harris, NP-C ?[facility] ?Personal contact information can be found on Amion  ?01/25/2022, 7:14 AM ? ? ? ?

## 2022-01-25 NOTE — Progress Notes (Signed)
Patient sedated and comfortable this AM. Discussed care with bedside RN, Dr. Halford Chessman and patient's significant other. Prolonged AW. Maintain sedation and reassess in 24 hours. Orders placed for dulcolax suppository for constipation-need to avoid rising ammonia levels. When alert can start Rifaximin. Will follow closely. Ketamine at 1.'5mg'$ /kg/hr, maintain scheduled phenobarb with prn bolus for breakthrough agitation. Avoid benzodiazepines. ? ?Lane Hacker, DO ?Palliative Medicine ? ? ?Time: 50 minutes ?

## 2022-01-25 NOTE — Progress Notes (Addendum)
Worsening agitation this evening in setting of acute refractory alcohol withdrawal and advanced liver disease.  Agitation worse with administration of lorazepam and found to have >550 cc retained urine, no bowel movement since admission with possible rising ammonia levels. Had done well most of the day and that afternoon was calm and appropriate in my visit. Orders given to RN and discussed a plan for managing his agitat ion overnight - avoid use of benzos and use bolus doses of phenobarbital prn instead.- '130mg'$  q1 hour prn of phenobarbital. Orders were also given for titration of ketamine infusion to 1.'5mg'$ /kg/hr and bolus of 0.'5mg'$ /kg for agitation as well. Foley was Place and orders in for rifaxamin, miralax and Mag and Thiamine IV.  Haldol was given for hallucinations. Will see in AM and will be available tonight if additional orders are needed to control his symptoms. His alcohol withdrawal should be improving tomorrow and it is possible that at least some of this acute worsening is related to his other medical issues and complications of sedation. ? ?Lane Hacker, DO ?Palliative Medicine ? ?

## 2022-01-25 NOTE — Progress Notes (Signed)
CPAP remains on hold at this time. Patient remains in 4 point restraints and having delirium. Equipment remains on standby for future use once it is safe to apply. RT will continue to follow ?

## 2022-01-25 NOTE — Progress Notes (Addendum)
eLink Physician-Brief Progress Note ?Patient Name: Levi Alvarez ?DOB: [birth year] ?MRN: [id number] ? ? ?Date of Service ? 01/25/2022  ?HPI/Events of Note ? Patient with persistent severe Delirium on Precedex gtt at 1.2 mcg.  ?eICU Interventions ? Precedex ceiling increased to 2.0 mcg with the goal of titrating down the gtt rate once a RAS goal of 0 to -1 is achieved.  ? ? ? ?  ? ?Frederik Pear ?01/25/2022, 2:39 AM ?

## 2022-01-26 DIAGNOSIS — J449 Chronic obstructive pulmonary disease, unspecified: Secondary | ICD-10-CM

## 2022-01-26 DIAGNOSIS — K7030 Alcoholic cirrhosis of liver without ascites: Secondary | ICD-10-CM

## 2022-01-26 LAB — COMPREHENSIVE METABOLIC PANEL
ALT: 28 U/L (ref 0–44)
AST: 31 U/L (ref 15–41)
Albumin: 3.6 g/dL (ref 3.5–5.0)
Alkaline Phosphatase: 73 U/L (ref 38–126)
Anion gap: 9 (ref 5–15)
BUN: 11 mg/dL (ref 6–20)
CO2: 19 mmol/L — ABNORMAL LOW (ref 22–32)
Calcium: 8.2 mg/dL — ABNORMAL LOW (ref 8.9–10.3)
Chloride: 104 mmol/L (ref 98–111)
Creatinine, Ser: 0.79 mg/dL (ref 0.61–1.24)
GFR, Estimated: >60 mL/min (ref >60)
Glucose, Bld: 115 mg/dL — ABNORMAL HIGH (ref 70–99)
Potassium: 3.5 mmol/L (ref 3.5–5.1)
Sodium: 132 mmol/L — ABNORMAL LOW (ref 135–145)
Total Bilirubin: 0.8 mg/dL (ref 0.3–1.2)
Total Protein: 6.9 g/dL (ref 6.5–8.1)

## 2022-01-26 LAB — GLUCOSE, CAPILLARY
Glucose-Capillary: 98 mg/dL (ref 70–99)
Glucose-Capillary: 116 mg/dL — ABNORMAL HIGH (ref 70–99)
Glucose-Capillary: 94 mg/dL (ref 70–99)
Glucose-Capillary: 72 mg/dL (ref 70–99)
Glucose-Capillary: 78 mg/dL (ref 70–99)
Glucose-Capillary: 87 mg/dL (ref 70–99)

## 2022-01-26 LAB — MAGNESIUM: Magnesium: 1.9 mg/dL (ref 1.7–2.4)

## 2022-01-26 LAB — PHOSPHORUS: Phosphorus: 3.5 mg/dL (ref 2.5–4.6)

## 2022-01-26 MED ORDER — PRAZOSIN HCL 1 MG PO CAPS
1.0000 mg | ORAL_CAPSULE | Freq: Every day | ORAL | Status: DC
Start: 2022-01-26 — End: 2022-01-29
  Administered 2022-01-26 – 2022-01-28 (×3): 1 mg via ORAL
  Filled 2022-01-26 (×4): qty 1

## 2022-01-26 MED ORDER — LACTATED RINGERS IV SOLN: INTRAVENOUS | Status: DC

## 2022-01-26 MED ORDER — ORAL CARE MOUTH RINSE
15.0000 mL | OROMUCOSAL | Status: DC
  Administered 2022-01-26 – 2022-01-29 (×18): 15 mL via OROMUCOSAL

## 2022-01-26 MED ORDER — TAMSULOSIN HCL 0.4 MG PO CAPS
0.4000 mg | ORAL_CAPSULE | Freq: Every day | ORAL | Status: DC
  Administered 2022-01-26 – 2022-01-28 (×3): 0.4 mg via ORAL
  Filled 2022-01-26 (×3): qty 1

## 2022-01-26 MED ORDER — CHLORHEXIDINE GLUCONATE 0.12% ORAL RINSE (MEDLINE KIT)
15.0000 mL | Freq: Two times a day (BID) | OROMUCOSAL | Status: DC
  Administered 2022-01-26 – 2022-01-29 (×6): 15 mL via OROMUCOSAL

## 2022-01-26 NOTE — Progress Notes (Signed)
Foley catheter removed at 1300. Bladder scan at 1800 reveals 320 mL of residual urine. In and out cath performed using sterile technique witnessed by Justice Britain, RN. 350 cc of clear, yellow urine returned ?

## 2022-01-26 NOTE — Progress Notes (Signed)
? ?NAME:  Levi Alvarez, MRN:  [id number], DOB:  [birth year], LOS: 3 ?ADMISSION DATE:  01/22/2022, CONSULTATION DATE:  01/23/22 ?REFERRING MD:  Dr. Tyrell Antonio, CHIEF COMPLAINT:  ETOH Withdrawal   ? ?History of Present Illness:  ?51 y/o M who presented to [facility] ER on 3/13 for elective alcohol detox.  ? ?At baseline, he lives at home with his girlfriend [place].  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  He has a hx of known alcoholism and prior admissions for withdrawal that were severe/required precedex and ICU admission.  He has documented liver cirrhosis, esophageal varices with prior GIB.   ? ?He called Dr. Hilma Favors from [place] to assist with admission and withdrawal process.  They have a relationship from prior family members being in hospice. It was felt he was too complex for detox at [facility] and he was referred to the ER at [facility] for admission.  The patient presented on 3/13 with tremors, nausea and vomiting.  Last alcohol consumption on 3/12 evening.  He was admitted per [facility] for detox and treated with ativan / CIWA protocol.  On 3/14 the phenobarbital protocol was initiated.   ? ?PCCM consulted for evaluation of withdrawal, concern for precedex need.   ? ?Pertinent  Medical History  ?ETOH Abuse ?ETOH Cirrhosis  ?Esophageal Varices  ?GIB ?OSA  ?COPD  ?Tobacco Abuse - smokes 1/2ppd ? ?Significant Hospital Events: ?Including procedures, antibiotic start and stop dates in addition to other pertinent events   ?3/13 Admit for planned detox ?3/14 PCCM consulted for evaluation of withdrawal. Precedex, phenobarbital, ketamine ?3/16 Significantly agitated overnight requiring uptitration of Precedex, Ketamine, and PRN Haldol, Currently deeply sedated for comfortable   ?3/17 Rested better overnight with less agitation, able to wean sedation slightly  ? ?Interim History / Subjective:  ?Deeply sedated with snoring respiration  ? ?Objective   ?Blood pressure 120/77, pulse 66, temperature 98.4 ?F (36.9 ?C),  temperature source Axillary, resp. rate (!) 21, height '5\' 10"'$  (1.778 m), weight 108.9 kg, SpO2 100 %. ?   ?   ? ?Intake/Output Summary (Last 24 hours) at 01/26/2022 0708 ?Last data filed at 01/26/2022 0600 ?Gross per 24 hour  ?Intake 1885.03 ml  ?Output 1350 ml  ?Net 535.03 ml  ? ? ?Filed Weights  ? 01/22/22 1236  ?Weight: 108.9 kg  ? ? ?Examination: ?General: Acute on chronically ill appearing middle aged male lying in bed, in NAD ?HEENT: [place]/AT, MM pink/moist, PERRL,  ?Neuro: Deeply sedated  ?CV: s1s2 regular rate and rhythm, no murmur, rubs, or gallops,  ?PULM:  Snoring respiration with decreased air entry, oxygen saturation appropriate on RA ?GI: soft, bowel sounds active in all 4 quadrants, non-tender, non-distended ?Extremities: warm/dry, no edema  ?Skin: no rashes or lesions ? ?Resolved Hospital Problem list   ?Elevated LFT's  ? ?Assessment & Plan:  ? ?Acute Alcohol Withdrawal  ?Chronic ETOH Abuse  ?-Admitted for elective detox.  Felt to be too complex medically for [facility].  Last drink 3/13 ~8pm.  ?P: ?Agitation/delirium overnight remained stable  ?Precedex wean slightly overnight, remains on ketamine and scheduled Phenobarbital  ?Try and wean sedation further today  ?Continue to follow CIWA  ?Continue to supplement folic acid, thiamine, and multivitamin  ?Monitor for signs of seizures  ?Remains in 4 point restraints  ?Benzodiazepines appear to worsen agitation   ? ?Acute Metabolic Encephalopathy  ?-In the setting of acute alcohol withdrawal  ?P: ?Maintain neuro protective measures ?Nutrition and bowel regiment  ?Seizure precautions  ?  Aspirations precautions  ?Supportive care  ?Delirium precautions  ? ?HTN  ?P: ?Resume Coreg and Cozaar when able to take orals  ?Continuous telemetry  ? ?Hyponatremia  ?P: ?Continue to trend CBC  ? ?Tobacco Abuse  ?COPD without Acute Exacerbation  ?P: ?Continue BDs ?Continue nicotine patch  ?Cessation education when appropriate  ? ?OSA ?P: ?Resume home CPAP when mentation improves   ? ?Chronic Hepatitis C+ ?-Not clear he has been treated  ?P: ?Outpatient follow up  ? ?Anxiety, Depression  ?P: ?Resume Effexor when able to take oral meds  ? ?Best Practice (right click and "Reselect all SmartList Selections" daily)  ?Diet/type: Regular consistency (see orders) ?DVT prophylaxis: SCD ?GI prophylaxis: PPI ?Lines: N/A ?Foley:  N/A ?Code Status:  full code ?Last date of multidisciplinary goals of care discussion: full code.  Wife updated at bedside 3/15 on plan of care ? ?Critical care time: 37 minutes  ?Gresham Caetano D. Harris, NP-C ?[facility] ?Personal contact information can be found on Amion  ?01/26/2022, 7:08 AM ? ? ? ?

## 2022-01-26 NOTE — Progress Notes (Signed)
Palliative Care ?Progress Note ? ?Patient had issue with bradycardia and apnea this AM. Likely mucous plugging- I suctioned a very large amount of thick, hard mucous from his posterior pharynx.He was able to cough after this. His sedation has been turned off and he is more alert-protecting his airway and following some commands. His vital signs have returned to normal. Cpap is off and he is doing well. Hopefully we can continue to use prn sedation at this point. Discussed continued use of phenobarb prn and stopping ketamine-if phenobarb not effective can restart precedex. Hopefully he is through the acute withdrawal phase and we can focus on mobilizing him, resuming his diet and getting resources in place for discharge. When he is more alert I will discuss goals of care and ACP. Discussed care with his [name] and his bedside RN. ? ? ?Time: 50 minutes ? ?Lane Hacker, DO ?Palliative Medicine ? ?

## 2022-01-26 NOTE — Progress Notes (Signed)
75 mL of ketamine bag wasted into stericycle witnessed by Donnamarie Rossetti, RN  ?

## 2022-01-27 ENCOUNTER — Inpatient Hospital Stay (HOSPITAL_COMMUNITY): Payer: Medicare (Managed Care)

## 2022-01-27 LAB — GLUCOSE, CAPILLARY
Glucose-Capillary: 88 mg/dL (ref 70–99)
Glucose-Capillary: 88 mg/dL (ref 70–99)
Glucose-Capillary: 111 mg/dL — ABNORMAL HIGH (ref 70–99)
Glucose-Capillary: 103 mg/dL — ABNORMAL HIGH (ref 70–99)
Glucose-Capillary: 97 mg/dL (ref 70–99)

## 2022-01-27 MED ORDER — CARVEDILOL 6.25 MG PO TABS
6.2500 mg | ORAL_TABLET | Freq: Two times a day (BID) | ORAL | Status: DC
  Administered 2022-01-27 – 2022-01-29 (×4): 6.25 mg via ORAL
  Filled 2022-01-27 (×4): qty 1

## 2022-01-27 MED ORDER — PANTOPRAZOLE SODIUM 40 MG PO TBEC
40.0000 mg | DELAYED_RELEASE_TABLET | Freq: Two times a day (BID) | ORAL | Status: DC
  Administered 2022-01-27 – 2022-01-29 (×4): 40 mg via ORAL
  Filled 2022-01-27 (×4): qty 1

## 2022-01-27 MED ORDER — LOSARTAN POTASSIUM 50 MG PO TABS
50.0000 mg | ORAL_TABLET | Freq: Every day | ORAL | Status: DC
  Administered 2022-01-27 – 2022-01-29 (×3): 50 mg via ORAL
  Filled 2022-01-27 (×3): qty 1

## 2022-01-27 MED ORDER — VENLAFAXINE HCL ER 75 MG PO CP24
75.0000 mg | ORAL_CAPSULE | Freq: Every day | ORAL | Status: DC
  Administered 2022-01-28 – 2022-01-29 (×2): 75 mg via ORAL
  Filled 2022-01-27 (×2): qty 1

## 2022-01-27 MED ORDER — MONTELUKAST SODIUM 10 MG PO TABS: 10.0000 mg | ORAL_TABLET | Freq: Every day | ORAL | Status: DC

## 2022-01-27 MED ORDER — FINASTERIDE 5 MG PO TABS
5.0000 mg | ORAL_TABLET | Freq: Every day | ORAL | Status: DC
  Administered 2022-01-27 – 2022-01-29 (×3): 5 mg via ORAL
  Filled 2022-01-27 (×3): qty 1

## 2022-01-27 MED ORDER — PHENOBARBITAL 32.4 MG PO TABS
64.8000 mg | ORAL_TABLET | Freq: Every day | ORAL | Status: DC
  Administered 2022-01-27 – 2022-01-28 (×2): 64.8 mg via ORAL
  Filled 2022-01-27 (×2): qty 2

## 2022-01-27 MED ORDER — NAPHAZOLINE-GLYCERIN 0.012-0.2 % OP SOLN: 1.0000 [drp] | Freq: Four times a day (QID) | OPHTHALMIC | Status: DC | PRN

## 2022-01-27 MED ORDER — NAPHAZOLINE-GLYCERIN 0.012-0.25 % OP SOLN
1.0000 [drp] | Freq: Four times a day (QID) | OPHTHALMIC | Status: DC | PRN
  Administered 2022-01-27: 1 [drp] via OPHTHALMIC
  Filled 2022-01-27 (×2): qty 15

## 2022-01-27 MED ORDER — LORATADINE 10 MG PO TABS
10.0000 mg | ORAL_TABLET | Freq: Every day | ORAL | Status: DC
  Administered 2022-01-27 – 2022-01-29 (×3): 10 mg via ORAL
  Filled 2022-01-27 (×3): qty 1

## 2022-01-27 NOTE — Progress Notes (Signed)

## 2022-01-27 NOTE — Progress Notes (Signed)
Pt c/o pain/ROM limitation in RUE/shoulder (stated he was wrestling with a friend prior to admission and hurt it). Will inform Dr. Ander Slade.  ?

## 2022-01-27 NOTE — Progress Notes (Signed)
Pt tolerated fluid/PO medications/breakfast well; will advance diet to regular diet per patient/family request. ?

## 2022-01-27 NOTE — Progress Notes (Signed)
? ?NAME:  Levi Alvarez, MRN:  [id number], DOB:  1971/03/11, LOS: 4 ?ADMISSION DATE:  01/22/2022, CONSULTATION DATE:  01/23/22 ?REFERRING MD:  Dr. Tyrell Antonio, CHIEF COMPLAINT:  ETOH Withdrawal   ? ?History of Present Illness:  ?51 y/o M who presented to [facility] ER on 3/13 for elective alcohol detox.  ? ?At baseline, he lives at home with his girlfriend [place].  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  He has a hx of known alcoholism and prior admissions for withdrawal that were severe/required precedex and ICU admission.  He has documented liver cirrhosis, esophageal varices with prior GIB.   ? ?He called Dr. Hilma Favors from [place] to assist with admission and withdrawal process.  They have a relationship from prior family members being in hospice. It was felt he was too complex for detox at [facility] and he was referred to the ER at [facility] for admission.  The patient presented on 3/13 with tremors, nausea and vomiting.  Last alcohol consumption on 3/12 evening.  He was admitted per [facility] for detox and treated with ativan / CIWA protocol.  On 3/14 the phenobarbital protocol was initiated.   ? ?PCCM consulted for evaluation of withdrawal, concern for precedex need.   ? ?Pertinent  Medical History  ?ETOH Abuse ?ETOH Cirrhosis  ?Esophageal Varices  ?GIB ?OSA  ?COPD  ?Tobacco Abuse - smokes 1/2ppd ? ?Significant Hospital Events: ?Including procedures, antibiotic start and stop dates in addition to other pertinent events   ?3/13 Admit for planned detox ?3/14 PCCM consulted for evaluation of withdrawal. Precedex, phenobarbital, ketamine ?3/16 Significantly agitated overnight requiring uptitration of Precedex, Ketamine, and PRN Haldol, Currently deeply sedated for comfortable   ?3/17 Rested better overnight with less agitation, able to wean sedation slightly  ?3/18 currently off Precedex ? ?Interim History / Subjective:  ?Easily arousable, interactive ?Did not want to converse much ?Denies any pain or  discomfort ? ?Objective   ?Blood pressure (!) 141/84, pulse 94, temperature (!) 100.4 ?F (38 ?C), temperature source Axillary, resp. rate 15, height '5\' 10"'$  (1.778 m), weight 108.9 kg, SpO2 99 %. ?   ?   ? ?Intake/Output Summary (Last 24 hours) at 01/27/2022 0716 ?Last data filed at 01/26/2022 1800 ?Gross per 24 hour  ?Intake 726.66 ml  ?Output 450 ml  ?Net 276.66 ml  ? ?Filed Weights  ? 01/22/22 1236  ?Weight: 108.9 kg  ? ? ?Examination: ?General: Chronically ill-appearing, does not appear to be in any distress ?HEENT: Oral mucosa ?Neuro: Easily arousable and interactive ?CV: S1-S2 appreciated with no murmurs ?PULM: Clear breath sounds, on room air  ?GI: Soft, bowel sounds appreciated  ?extremities: warm/dry, no edema  ?Skin: no rashes or lesions ? ?Resolved Hospital Problem list   ?Elevated LFT's  ? ?Assessment & Plan:  ? ?Alcohol withdrawal ?Chronic EtOH abuse ?-Admitted for elective detoxification ?-Last drink 3/13 ? ?-Currently off Precedex ?-Last dose of phenobarbital at 0421 hrs. 3/18 ?-On rifaximin, thiamine, folic acid ?-Continue to follow CIWA ?-Monitor for seizures ? ?Acute metabolic encephalopathy ?-This is in the setting of alcohol withdrawal ?-Neuroprotective measures ?-Seizure precautions ?-Aspiration precautions ?-Delirium precautions ? ?Hyponatremia ?-Stable ?-Continue to trend ? ?Tobacco abuse ?COPD without acute exacerbation ?-Continue bronchodilators ?-Nicotine patch in place ? ?History of hypertension ?-To resume Coreg and Cozaar when able to take oral medications ? ?Obstructive sleep apnea ?-Resume home CPAP when mentation improves ? ?Chronic Hepatitis C+ ?-Not clear he has been treated  ?-Outpatient follow up  ? ?Anxiety, Depression  ?  P: ?Resume Effexor when able to take oral meds  ? ?Best Practice (right click and "Reselect all SmartList Selections" daily)  ?Diet/type: Regular consistency (see orders) ?DVT prophylaxis: SCD ?GI prophylaxis: PPI ?Lines: N/A ?Foley:  N/A ?Code Status:  full  code ?Last date of multidisciplinary goals of care discussion: full code.  Wife updated at bedside 3/15 on plan of care ? ?The patient is critically ill with multiple organ systems failure and requires high complexity decision making for assessment and support, frequent evaluation and titration of therapies, application of advanced monitoring technologies and extensive interpretation of multiple databases. Critical Care Time devoted to patient care services described in this note independent of APP/resident time (if applicable)  is 32 minutes.  ? ?Sherrilyn Rist MD ?[facility] ?Personal pager: See Shea Evans ?If unanswered, please page ?CCM On-call: [phone] ? ? ?

## 2022-01-28 DIAGNOSIS — J438 Other emphysema: Secondary | ICD-10-CM

## 2022-01-28 DIAGNOSIS — G4733 Obstructive sleep apnea (adult) (pediatric): Secondary | ICD-10-CM

## 2022-01-28 DIAGNOSIS — E871 Hypo-osmolality and hyponatremia: Secondary | ICD-10-CM

## 2022-01-28 LAB — GLUCOSE, CAPILLARY
Glucose-Capillary: 101 mg/dL — ABNORMAL HIGH (ref 70–99)
Glucose-Capillary: 96 mg/dL (ref 70–99)

## 2022-01-28 MED ORDER — MELATONIN 5 MG PO TABS
5.0000 mg | ORAL_TABLET | Freq: Once | ORAL | Status: AC
  Administered 2022-01-28: 5 mg via ORAL
  Filled 2022-01-28: qty 1

## 2022-01-28 MED ORDER — LACTATED RINGERS IV SOLN: INTRAVENOUS | Status: DC

## 2022-01-28 MED ORDER — NICOTINE 21 MG/24HR TD PT24
21.0000 mg | MEDICATED_PATCH | Freq: Once | TRANSDERMAL | Status: AC
  Administered 2022-01-28: 21 mg via TRANSDERMAL
  Filled 2022-01-28: qty 1

## 2022-01-28 NOTE — Evaluation (Signed)
Physical Therapy Evaluation ?Patient Details ?Name: Levi Alvarez ?MRN: [id number] ?DOB: [birth year] ?Today's Date: 01/28/2022 ? ?History of Present Illness ? Pt admitted from home for elective alchohol withdrawal.  Pt with hx of COPD, DDD, htn ETOH abuse, liver cirrhosis, and chronic hep C  ?Clinical Impression ? Pt admitted as above and presenting with functional mobility limitations 2* ambulatory balance deficits, decreased endurance and c/o dizziness with mobility (BP ambulating 153/95).  Pt should progress to dc home with assist of family. ?   ? ?Recommendations for follow up therapy are one component of a multi-disciplinary discharge planning process, led by the attending physician.  Recommendations may be updated based on patient status, additional functional criteria and insurance authorization. ? ?Follow Up Recommendations No PT follow up ? ?  ?Assistance Recommended at Discharge PRN  ?Patient can return home with the following ?   ? ?  ?Equipment Recommendations None recommended by PT  ?Recommendations for Other Services ?    ?  ?Functional Status Assessment Patient has had a recent decline in their functional status and demonstrates the ability to make significant improvements in function in a reasonable and predictable amount of time.  ? ?  ?Precautions / Restrictions Precautions ?Precautions: Fall ?Restrictions ?Weight Bearing Restrictions: No  ? ?  ? ?Mobility ? Bed Mobility ?Overal bed mobility: Modified Independent ?  ?  ?  ?  ?  ?  ?General bed mobility comments: use of bed rail but no physical assist ?  ? ?Transfers ?Overall transfer level: Needs assistance ?Equipment used: Rolling walker (2 wheels) ?Transfers: Sit to/from Stand ?Sit to Stand: Min assist ?  ?  ?  ?  ?  ?General transfer comment: steady assist ?  ? ?Ambulation/Gait ?Ambulation/Gait assistance: Min assist, Min guard ?Gait Distance (Feet): 450 Feet ?Assistive device: Rollator (4 wheels) ?Gait Pattern/deviations: Step-through pattern,  Decreased step length - right, Decreased step length - left, Shuffle, Trunk flexed ?  ?  ?  ?General Gait Details: initial min assist for balance but progressing to min guard with increased distance.  Cues for posture and position from RW; multiple standing rest breaks 2* fatigue and c/o dizziness - BP 153/95 ? ?Stairs ?  ?  ?  ?  ?  ? ?Wheelchair Mobility ?  ? ?Modified Rankin (Stroke Patients Only) ?  ? ?  ? ?Balance Overall balance assessment: Needs assistance ?Sitting-balance support: No upper extremity supported, Feet supported ?Sitting balance-Leahy Scale: Good ?  ?  ?Standing balance support: No upper extremity supported ?Standing balance-Leahy Scale: Poor ?  ?  ?  ?  ?  ?  ?  ?  ?  ?  ?  ?  ?   ? ? ? ?Pertinent Vitals/Pain Pain Assessment ?Pain Assessment: Faces ?Pain Score: 2  ?Pain Location: R shoulder with limited movement ?Pain Descriptors / Indicators: Aching, Sore, Grimacing ?Pain Intervention(s): Limited activity within patient's tolerance, Monitored during session  ? ? ?Home Living Family/patient expects to be discharged to:: Private residence ?Living Arrangements: Spouse/significant other ?Available Help at Discharge: Family;Available PRN/intermittently ?Type of Home: House ?Home Access: Ramped entrance ?  ?  ?  ?Home Layout: One level ?Home Equipment: Wheelchair - Publishing copy (2 wheels);Cane - single point ?   ?  ?Prior Function Prior Level of Function : Independent/Modified Independent ?  ?  ?  ?  ?  ?  ?  ?  ?  ? ? ?Hand Dominance  ?   ? ?  ?Extremity/Trunk Assessment  ? Upper  Extremity Assessment ?Upper Extremity Assessment: RUE deficits/detail ?RUE Deficits / Details: shoulder pain with movement ?  ? ?Lower Extremity Assessment ?Lower Extremity Assessment: Overall WFL for tasks assessed ?  ? ?Cervical / Trunk Assessment ?Cervical / Trunk Assessment: Normal  ?Communication  ? Communication: No difficulties  ?Cognition Arousal/Alertness: Awake/alert ?Behavior During Therapy: [facility] for  tasks assessed/performed ?Overall Cognitive Status: Within Functional Limits for tasks assessed ?  ?  ?  ?  ?  ?  ?  ?  ?  ?  ?  ?  ?  ?  ?  ?  ?  ?  ?  ? ?  ?General Comments   ? ?  ?Exercises    ? ?Assessment/Plan  ?  ?PT Assessment Patient needs continued PT services  ?PT Problem List Decreased activity tolerance;Decreased balance;Decreased mobility;Decreased knowledge of use of DME ? ?   ?  ?PT Treatment Interventions DME instruction;Gait training;Stair training;Functional mobility training;Therapeutic activities;Therapeutic exercise;Balance training;Patient/family education   ? ?PT Goals (Current goals can be found in the Care Plan section)  ?Acute Rehab PT Goals ?Patient Stated Goal: Regain IND ?PT Goal Formulation: With patient ?Time For Goal Achievement: 02/11/22 ?Potential to Achieve Goals: Good ? ?  ?Frequency Min 3X/week ?  ? ? ?Co-evaluation   ?  ?  ?  ?  ? ? ?  ?AM-PAC PT "6 Clicks" Mobility  ?Outcome Measure Help needed turning from your back to your side while in a flat bed without using bedrails?: None ?Help needed moving from lying on your back to sitting on the side of a flat bed without using bedrails?: None ?Help needed moving to and from a bed to a chair (including a wheelchair)?: A Little ?Help needed standing up from a chair using your arms (e.g., wheelchair or bedside chair)?: A Little ?Help needed to walk in hospital room?: A Little ?Help needed climbing 3-5 steps with a railing? : A Lot ?6 Click Score: 19 ? ?  ?End of Session Equipment Utilized During Treatment: Gait belt ?Activity Tolerance: Patient tolerated treatment well ?Patient left: in chair;with call bell/phone within reach;with chair alarm set;with family/visitor present ?Nurse Communication: Mobility status ?PT Visit Diagnosis: Unsteadiness on feet (R26.81);Difficulty in walking, not elsewhere classified (R26.2) ?  ? ?Time: 1694-5038 ?PT Time Calculation (min) (ACUTE ONLY): 21 min ? ? ?Charges:   PT Evaluation ?$PT Eval Low  Complexity: 1 Low ?  ?  ?   ? ? ?Debe Coder PT ?Acute Rehabilitation Services ?Pager [phone] ?Office [phone] ? ? ?Cali Hope ?01/28/2022, 1:16 PM ? ?

## 2022-01-28 NOTE — Progress Notes (Addendum)
?PROGRESS NOTE ? ? ? ?Levi Alvarez  XBJ:[id number] DOB: 1971/09/30 DOA: 01/22/2022 ?PCP: Levi Side., FNP  ? ? ? ?Brief Narrative:  ?51 year old with past medical history significant for chronic hepatitis C, alcoholism, liver cirrhosis, esophageal varices, history of upper GI bleed, sleep apnea, COPD, who presents to the ED for further evaluation of alcohol withdrawal detox.  Patient presents with tremors, nausea and vomiting.  Patient drinks 20 to 2412 ounces cans of beer a day.  He has a prior history of severe alcohol withdrawal requiring pressor and phenobarbital in the past.  He was referred for admission by Dr. Donovan Kail. ? ?Patient will be loaded with phenobarbital, continue with Ativan.  Will ask CCM for consult, concern patient will required precedex gtt.  ? ? ?Subjective: ? ?He is sitting up in chair, reports chronic back pain, aaox3, reports feeling better ? ?Significant other  at bedside  ? ?Assessment & Plan: ? Principal Problem: ?  Alcohol withdrawal ([place]) ?Active Problems: ?  Essential hypertension ?  OSA (obstructive sleep apnea) ?  Elevated liver enzymes ?  Smoker ?  Hyperlipidemia ?  Chronic hepatitis C without hepatic coma (HCC) ?  Cirrhosis with alcoholism ([place]) ?  Chronic obstructive pulmonary disease (HCC) ?  Class 1 obesity ?  Hyponatremia ? ? ? ?Assessment and Plan: ? ?* Alcohol withdrawal (HCC)/acute alcohol induced encephalopathy  ?-He was admitted per [facility] for detox and treated with ativan / CIWA protocol.  ?-he was transferred to pccm and was treated with  precedex drip, katamine, phenobarbital protocol  ?-he has improved and transferred back to [facility] on 3/19 ?-Continue with Thiamine and folic acid. ?-much improved, aaox3 today ?  ? ?Hyponatremia ?Continue IV fluids, repeat lab in am ? ?Elevated liver enzymes ?LFT normalized.  ?In setting alcohol use.  ? ?Cirrhosis with alcoholism (HCC)/ h/o hepc  ? GI out patient follow up ? ?Essential hypertension ?Continue with Carvedilol and Cozaar.   ? ?Chronic obstructive pulmonary disease ([place]) ?Lung is clear ?Continue with Albuterol PRN ? ?Smoker ?Nicotine patch ordered.  ? ?Class 1 obesity Body mass index is 34.44 kg/m?. ?Obstructive sleep apnea ?-Resume home CPAP when mentation improves ?Needs life style modification.  ? ?   ? ? ?I have Reviewed nursing notes, Vitals, pain scores, I/o's, Lab results and  imaging results since pt's last encounter, details please see discussion above  ?I ordered the following labs:  ?Unresulted Labs (From admission, onward)  ? ?  Start     Ordered  ? 01/29/22 0500  CBC  Tomorrow morning,   R       ?Question:  Specimen collection method  Answer:  Lab=Lab collect  ? 01/28/22 2250  ? 01/29/22 0500  Comprehensive metabolic panel  Tomorrow morning,   R       ?Question:  Specimen collection method  Answer:  Lab=Lab collect  ? 01/28/22 2250  ? 01/29/22 0500  Magnesium  Tomorrow morning,   R       ?Question:  Specimen collection method  Answer:  Lab=Lab collect  ? 01/28/22 2250  ? ?  ?  ? ?  ? ? ? ?DVT prophylaxis: enoxaparin (LOVENOX) injection 40 mg Start: 01/24/22 1100 ?SCDs Start: 01/22/22 1553 ? ? ?Code Status:   Code Status: Full Code ? ?Family Communication: girlfriend at bedside  ?Disposition:  ? ?Status is: Inpatient ?  ? ?Dispo: The patient is from:  ?             Anticipated d/c  is to: home on Monday if continue to be stable  ?              ? ?Antimicrobials:   ?Anti-infectives (From admission, onward)  ? ? Start     Dose/Rate Route Frequency Ordered Stop  ? 01/25/22 2200  rifaximin (XIFAXAN) tablet 550 mg       ? 550 mg Oral 2 times daily 01/25/22 0906    ? 01/24/22 2345  rifaximin (XIFAXAN) tablet 200 mg  Status:  Discontinued       ? 200 mg Oral 2 times daily 01/24/22 2249 01/25/22 0906  ? ?  ? ? ? ? ? ? ?Objective: ?Vitals:  ? 01/28/22 0539 01/28/22 1331 01/28/22 2039 01/28/22 2225  ?BP: 126/72 138/90 131/85   ?Pulse: 63 67 62   ?Resp: _0 ?Temp:  99.1 ?F (37.3 ?C) 98 ?F (36.7 ?C)   ?TempSrc:  Oral Oral    ?SpO2: 100% 100% 98%   ?Weight:      ?Height:      ? ? ?Intake/Output Summary (Last 24 hours) at 01/28/2022 2258 ?Last data filed at 01/28/2022 1805 ?Gross per 24 hour  ?Intake 1250.38 ml  ?Output 350 ml  ?Net 900.38 ml  ? ?Filed Weights  ? 01/22/22 1236  ?Weight: 108.9 kg  ? ? ?Examination: ? ?General exam: alert, awake, communicative,calm, NAD ?Respiratory system: Clear to auscultation. Respiratory effort normal. ?Cardiovascular system:  RRR.  ?Gastrointestinal system: Abdomen is nondistended, soft and nontender.  Normal bowel sounds heard. ?Central nervous system: Alert and oriented. No focal neurological deficits. ?Extremities:  no edema ?Skin: No rashes, lesions or ulcers ?Psychiatry: Judgement and insight appear normal. Mood & affect appropriate.  ? ? ? ?Data Reviewed: I have personally reviewed  labs and visualized  imaging studies since the last encounter and formulate the plan  ? ? ? ? ? ? ?Scheduled Meds: ? carvedilol  6.25 mg Oral BID WC  ? chlorhexidine gluconate (MEDLINE KIT)  15 mL Mouth Rinse BID  ? Chlorhexidine Gluconate Cloth  6 each Topical Q0600  ? enoxaparin (LOVENOX) injection  40 mg Subcutaneous Daily  ? finasteride  5 mg Oral Daily  ? folic acid  1 mg Oral Daily  ? loratadine  10 mg Oral Daily  ? losartan  50 mg Oral Daily  ? mouth rinse  15 mL Mouth Rinse 10 times per day  ? nicotine  21 mg Transdermal Daily  ? nicotine  21 mg Transdermal Once  ? pantoprazole  40 mg Oral BID  ? phenobarbital  64.8 mg Oral QHS  ? polyethylene glycol  17 g Oral Daily  ? prazosin  1 mg Oral QHS  ? rifaximin  550 mg Oral BID  ? tamsulosin  0.4 mg Oral QHS  ? thiamine  100 mg Oral Daily  ? venlafaxine XR  75 mg Oral Q breakfast  ? ?Continuous Infusions: ? lactated ringers 50 mL/hr at 01/28/22 1558  ? ? ? LOS: 5 days  ? ? ? ? ?Florencia Reasons, MD PhD FACP ?[facility] ? ?Available via Epic secure chat 7am-7pm for nonurgent issues ?Please page for urgent issues ?To page the attending provider between 7A-7P or the  covering provider during after hours 7P-7A, please log into the web site [email or web address] and access using universal  password for that web site. If you do not have the password, please call the hospital operator. ? ? ? ?01/28/2022, 10:58 PM  ? ? ?

## 2022-01-29 ENCOUNTER — Other Ambulatory Visit: Payer: Self-pay | Admitting: Internal Medicine

## 2022-01-29 LAB — CBC
HCT: 35.8 % — ABNORMAL LOW (ref 39.0–52.0)
Hemoglobin: 11.5 g/dL — ABNORMAL LOW (ref 13.0–17.0)
MCH: 28.9 pg (ref 26.0–34.0)
MCHC: 32.1 g/dL (ref 30.0–36.0)
MCV: 89.9 fL (ref 80.0–100.0)
Platelets: 136 10*3/uL — ABNORMAL LOW (ref 150–400)
RBC: 3.98 MIL/uL — ABNORMAL LOW (ref 4.22–5.81)
RDW: 16.8 % — ABNORMAL HIGH (ref 11.5–15.5)
WBC: 5 10*3/uL (ref 4.0–10.5)
nRBC: 0 % (ref 0.0–0.2)

## 2022-01-29 LAB — COMPREHENSIVE METABOLIC PANEL
ALT: 40 U/L (ref 0–44)
AST: 44 U/L — ABNORMAL HIGH (ref 15–41)
Albumin: 3.3 g/dL — ABNORMAL LOW (ref 3.5–5.0)
Alkaline Phosphatase: 77 U/L (ref 38–126)
Anion gap: 6 (ref 5–15)
BUN: 12 mg/dL (ref 6–20)
CO2: 24 mmol/L (ref 22–32)
Calcium: 8.7 mg/dL — ABNORMAL LOW (ref 8.9–10.3)
Chloride: 105 mmol/L (ref 98–111)
Creatinine, Ser: 0.74 mg/dL (ref 0.61–1.24)
GFR, Estimated: >60 mL/min (ref >60)
Glucose, Bld: 105 mg/dL — ABNORMAL HIGH (ref 70–99)
Potassium: 4 mmol/L (ref 3.5–5.1)
Sodium: 135 mmol/L (ref 135–145)
Total Bilirubin: 0.2 mg/dL — ABNORMAL LOW (ref 0.3–1.2)
Total Protein: 6.8 g/dL (ref 6.5–8.1)

## 2022-01-29 LAB — MAGNESIUM: Magnesium: 1.9 mg/dL (ref 1.7–2.4)

## 2022-01-29 MED ORDER — ATENOLOL 50 MG PO TABS: 50.0000 mg | ORAL_TABLET | Freq: Every day | ORAL | 6 refills | Status: DC

## 2022-01-29 MED ORDER — PHENOBARBITAL 30 MG PO TABS: 30.0000 mg | ORAL_TABLET | Freq: Every day | ORAL | 3 refills | Status: DC

## 2022-01-29 MED ORDER — TAMSULOSIN HCL 0.4 MG PO CAPS: 0.4000 mg | ORAL_CAPSULE | Freq: Every day | ORAL | 3 refills | Status: DC

## 2022-01-29 MED ORDER — NICOTINE 21 MG/24HR TD PT24
21.0000 mg | MEDICATED_PATCH | Freq: Every day | TRANSDERMAL | 3 refills | Status: DC
Start: 2022-01-30 — End: 2023-12-04

## 2022-01-29 MED ORDER — VITAMIN B1 100 MG PO TABS: 100.0000 mg | ORAL_TABLET | Freq: Every morning | ORAL | 3 refills | Status: AC

## 2022-01-29 MED ORDER — PANTOPRAZOLE SODIUM 40 MG PO TBEC: 40.0000 mg | DELAYED_RELEASE_TABLET | Freq: Every day | ORAL | Status: DC

## 2022-01-29 MED ORDER — TRAZODONE HCL 50 MG PO TABS: 50.0000 mg | ORAL_TABLET | Freq: Every day | ORAL | 3 refills | Status: DC

## 2022-01-29 MED ORDER — PRAZOSIN HCL 1 MG PO CAPS: 1.0000 mg | ORAL_CAPSULE | Freq: Every day | ORAL | 3 refills | Status: DC

## 2022-01-29 MED ORDER — NALTREXONE HCL 50 MG PO TABS: 50.0000 mg | ORAL_TABLET | Freq: Every day | ORAL | 0 refills | Status: DC

## 2022-01-29 MED ORDER — FLUTICASONE FUROATE-VILANTEROL 200-25 MCG/ACT IN AEPB: 1.0000 | INHALATION_SPRAY | Freq: Every day | RESPIRATORY_TRACT | 3 refills | Status: DC

## 2022-01-29 MED ORDER — POLYETHYLENE GLYCOL 3350 17 G PO PACK: 17.0000 g | PACK | Freq: Every day | ORAL | 0 refills | Status: DC

## 2022-01-29 NOTE — Discharge Instructions (Signed)

## 2022-01-29 NOTE — Progress Notes (Signed)
D/C instructions given to patient. Patient has no questions. NT will wheel patient out  ?

## 2022-01-29 NOTE — TOC Transition Note (Signed)
Transition of Care (TOC) - CM/SW Discharge Note ? ? ?Patient Details  ?Name: Levi Alvarez ?MRN: [id number] ?Date of Birth: 1971-03-02 ? ?Transition of Care (TOC) CM/SW Contact:  ?Kyrstyn Greear, LCSW ?Phone Number: ?01/29/2022, 11:31 AM ? ? ?Clinical Narrative:    ?Met with pt and s/o today to review information about local SA resources places on AVS.  Spoke specifically with them about recommendation for an IOP and the patient is very agreeable.  S/o to assist patient in connecting with one of these local programs and pt presents as highly motivated to address these issues.  No further TOC needs. ? ? ?Final next level of care: Home/Self Care ?Barriers to Discharge: Barriers Resolved ? ? ?Patient Goals and CMS Choice ?Patient states their goals for this hospitalization and ongoing recovery are:: sobriety ?  ?Choice offered to / list presented to : NA ? ?Discharge Placement ?  ?           ?  ?  ?  ?  ? ?Discharge Plan and Services ?In-house Referral: Clinical Social Work ?  ?Post Acute Care Choice: NA          ?DME Arranged: N/A ?DME Agency: NA ?  ?  ?  ?  ?  ?  ?  ?  ? ?Social Determinants of Health (SDOH) Interventions ?  ? ? ?Readmission Risk Interventions ?Readmission Risk Prevention Plan 01/23/2022  ?[place] or Home Care Consult Complete  ?Social Work Consult for [place] Planning/Counseling Complete  ?Palliative Care Screening Not Applicable  ?Medication Review Press photographer) Complete  ?Some recent data might be hidden  ? ? ? ? ? ?

## 2022-01-29 NOTE — Progress Notes (Signed)
Mobility Specialist - Cancellation Note ? ? 01/29/22 1247  ?Mobility  ?Activity Refused mobility  ? ?Pt dressed and preparing for d/c.  ? ?Levi Karel S. ?Mobility Specialist ?Acute Rehabilitation Services ?Phone: [phone] ?01/29/22, 12:48 PM ? ?

## 2022-01-29 NOTE — Discharge Summary (Signed)
Physician Discharge Summary  ?Levi Alvarez:[id number] DOB: 1971-09-01 ? ?PCP: Sonia Side., FNP ? ?Admitted from: Home ?Discharged to: Home ? ?Admit date: 01/22/2022 ?Discharge date: 01/29/2022 ? ?Recommendations for Outpatient Follow-up:  ? ? Follow-up Information   ? ? Sonia Side., FNP. Schedule an appointment as soon as possible for a visit in 1 week(s).   ?Specialty: Family Medicine ?Why: To be seen with repeat labs (CBC & CMP). ?Contact information: ?[employer] ?[place] [address] ?[phone] ? ? ?  ?  ? ?  ?  ? ?  ? ? ? ?Home Health: None ?  ? ?Equipment/Devices: None ?  ? ?Discharge Condition: Improved and stable. ?  Code Status: Full Code ?Diet recommendation:  ?Discharge Diet Orders (From admission, onward)  ? ?  Start     Ordered  ? 01/29/22 0000  Diet - low sodium heart healthy       ? 01/29/22 1252  ? ?  ?  ? ?  ?  ? ?Discharge Diagnoses:  ?Principal Problem: ?  Alcohol withdrawal ([place]) ?Active Problems: ?  Essential hypertension ?  OSA (obstructive sleep apnea) ?  Elevated liver enzymes ?  Smoker ?  Hyperlipidemia ?  Chronic hepatitis C without hepatic coma (HCC) ?  Cirrhosis with alcoholism ([place]) ?  Chronic obstructive pulmonary disease (HCC) ?  Class 1 obesity ?  Hyponatremia ? ? ?Brief Summary: ?51 y/o M who presented to [facility] ER on 3/13 for elective alcohol detox.  ?  ?At baseline, he lives at home with his girlfriend [place].  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  He has a hx of known alcoholism and prior admissions for withdrawal that were severe/required precedex and ICU admission.  He has documented liver cirrhosis, esophageal varices with prior GIB.   ?  ?He called Dr. Hilma Favors from [facility] to assist with admission and withdrawal process.  They have a relationship from prior family members being in hospice. It was felt he was too complex for detox at [facility] and he was referred to the ER at [facility] for admission.  The patient presented on 3/13 with tremors,  nausea and vomiting.  Last alcohol consumption on 3/12 evening.  He was admitted per [facility] for detox and treated with ativan / CIWA protocol.  On 3/14 the phenobarbital protocol was initiated.   ?  ?PCCM consulted for evaluation of withdrawal, concern for precedex need.  In the ICU, significantly agitated on 3/16 requiring up titration of Precedex, ketamine and as needed Haldol.  Clinically improved and care transferred over from [facility] to [facility] and floor on 3/19. ?  ?Assessment and plan: ? ?Alcohol withdrawal ?Chronic EtOH abuse ?-Admitted for elective detoxification.  History as noted above. ?-Last drink 3/13 ?-Initially admitted by [facility], treated with Ativan per CIWA protocol. ?-Transferred to PCCM and ICU where he was treated with Precedex drip, phenobarbital protocol and ketamine. ?-He was also placed on rifaximin, thiamine, folate and multivitamins. ?-He clinically improved and was transferred out of ICU to the floor. ?-Dr. Hilma Favors saw him in follow-up on the morning of discharge and did most of his home medication reconciliation.  She recommended discontinuing rifaximin, carvedilol and losartan.  She has called in prescriptions for as needed oral luminal and trazodone for sleep and lingering withdrawal symptoms which could last even a few weeks.  She has cleared him for discharge home. ?-No overt withdrawal signs or symptoms noted. ?-TOC has provided patient with alcohol abstinence  resources. ?-Elevated LFTs in the context of alcohol use have improved/almost resolved. ?  ?Acute metabolic encephalopathy ?-This is in the setting of alcohol withdrawal ?-Resolved. ? ?Hyponatremia ?-Resolved. ? ?Tobacco abuse ?COPD without acute exacerbation ?-Cessation counseled ?-Nicotine patch to continue. ?  ?History of hypertension ?-Dr. Hilma Favors discontinued his carvedilol and losartan and has placed him on prazosin. ?-Outpatient follow-up ?  ?Obstructive sleep apnea ?-Continue prior home CPAP. ?  ?Chronic Hepatitis C+ ?-Not clear he  has been treated  ?-Outpatient follow up with PCP and ID consultation if needed via PCP. ? ?Liver cirrhosis with evidence of portal hypertension ?Based on prior imaging review on chart review. ?Dr. Hilma Favors recommends discontinuing rifaximin at discharge. ?Outpatient follow-up with PCP or GI. ?  ?Anxiety, Depression  ?Continue Effexor. ? ?Left upper extremity swelling ?Most likely related to IVF infiltration and a tight bandage around arm. ?Reviewed with RN at bedside, DC IV fluids and remove the taping. ?Advised patient to elevate left upper extremity as much as possible and monitor for improvement. ?Advised patient that if the swelling does not improve over the next several days or if it worsens or he develops pain, redness etc., to seek immediate medical attention and he verbalized understanding. ? ?Anemia ?May be multifactorial including dilutional, anemia of chronic disease. ?No overt bleeding reported. ?Relatively stable compared to prior labs. ?Follow CBC closely as outpatient. ? ?Thrombocytopenia ?Likely related to alcohol toxic effect. ?Improving. ?Follow CBC as outpatient. ? ?Body mass index is 34.44 kg/m?[place]  Obesity ?Needs lifestyle modification as outpatient. ? ? ?Consultations: ?PCCM ?Dr. Hilma Favors, from palliative care medicine ? ?Procedures: ?None. ? ? ?Discharge Instructions ? ?Discharge Instructions   ? ? Call MD for:   Complete by: As directed ?  ? Recurrent alcohol withdrawal.  ? Call MD for:  difficulty breathing, headache or visual disturbances   Complete by: As directed ?  ? Call MD for:  extreme fatigue   Complete by: As directed ?  ? Call MD for:  persistant dizziness or light-headedness   Complete by: As directed ?  ? Call MD for:  persistant nausea and vomiting   Complete by: As directed ?  ? Call MD for:  severe uncontrolled pain   Complete by: As directed ?  ? Call MD for:  temperature >100.4   Complete by: As directed ?  ? Diet - low sodium heart healthy   Complete by: As directed ?  ?  Discharge instructions   Complete by: As directed ?  ? Advised to elevate left upper extremity above the level of the heart while laying in bed or sitting up, for a couple of days until swelling has subsided.  Seek immediate medical attention if worsening of swelling or develops pain, redness etc.  ? Increase activity slowly   Complete by: As directed ?  ? No wound care   Complete by: As directed ?  ? ?  ? ?  ?Medication List  ?  ? ?STOP taking these medications   ? ?carvedilol 6.25 MG tablet ?Commonly known as: COREG ?  ?finasteride 5 MG tablet ?Commonly known as: PROSCAR ?  ?losartan 100 MG tablet ?Commonly known as: COZAAR ?  ?Milk Thistle 1000 MG Caps ?  ?montelukast 10 MG tablet ?Commonly known as: SINGULAIR ?  ? ?  ? ?TAKE these medications   ? ?azelastine 0.1 % nasal spray ?Commonly known as: ASTELIN ?Place 1 spray into both nostrils daily as needed for allergies. ?  ?fluticasone furoate-vilanterol 200-25 MCG/ACT Aepb ?Commonly  known as: Breo Ellipta ?Inhale 1 puff into the lungs daily. ?What changed: medication strength ?  ?folic acid 1 MG tablet ?Commonly known as: FOLVITE ?Take 1 mg by mouth in the morning. ?  ?loratadine 10 MG tablet ?Commonly known as: CLARITIN ?Take 10 mg by mouth daily. ?  ?multivitamin with minerals Tabs tablet ?Take 1 tablet by mouth daily with breakfast. ?  ?naltrexone 50 MG tablet ?Commonly known as: DEPADE ?Take 1 tablet (50 mg total) by mouth daily. ?  ?nicotine 21 mg/24hr patch ?Commonly known as: NICODERM CQ - dosed in mg/24 hours ?Place 1 patch (21 mg total) onto the skin daily. ?Start taking on: January 30, 2022 ?  ?pantoprazole 40 MG tablet ?Commonly known as: PROTONIX ?Take 1 tablet (40 mg total) by mouth daily. ?What changed: when to take this ?  ?PHENObarbital 30 MG tablet ?Commonly known as: LUMINAL ?Take 1-2 tablets (30-60 mg total) by mouth at bedtime. As needed for sleep, anxiety, agitation ?  ?polyethylene glycol 17 g packet ?Commonly known as: MIRALAX / GLYCOLAX ?Take  17 g by mouth daily. ?Start taking on: January 30, 2022 ?  ?prazosin 1 MG capsule ?Commonly known as: MINIPRESS ?Take 1 capsule (1 mg total) by mouth at bedtime. ?  ?sildenafil 50 MG tablet ?Commonly known

## 2022-01-29 NOTE — Progress Notes (Signed)
Palliative Care ?Progress note ? ?51 year old man with advanced liver disease admitted on 01/23/22 for voluntary alcohol detox he has a history he had a history of previous life-threatening alcohol withdrawal with delirium tremens and historically required ICU level care. ? ?Patient was admitted to ICU with onset of symptoms that included tremors, hallucinations, confusion and severe agitation 12 hours after his last drink and initiated on benzodiazapine sparing phenobarbital protocol. Due to severe agitation was also started on a ketamine infusion which were weaned to off on 3/17. ? ?He looks exceptionally well today-he is OOB and positive about his future and abstinence from ETOH. I discussed discharge medications with them including use of PRN oral luminal and trazodone for sleep and lingering withdrawal symptoms-these can last even for a few weeks.  ? ?I discussed goals of care and provided information on advance care planning-will follow up with them outpatient. ? ? ?Time: 50 min ? ?Lane Hacker, DO ?Palliative Medicine ? ? ? ?

## 2022-02-14 ENCOUNTER — Other Ambulatory Visit (HOSPITAL_COMMUNITY): Payer: Self-pay | Admitting: Neurological Surgery

## 2022-02-14 DIAGNOSIS — M5412 Radiculopathy, cervical region: Secondary | ICD-10-CM

## 2022-02-15 ENCOUNTER — Encounter: Payer: Self-pay | Admitting: Urology

## 2022-02-15 ENCOUNTER — Ambulatory Visit (INDEPENDENT_AMBULATORY_CARE_PROVIDER_SITE_OTHER): Payer: Medicare (Managed Care) | Admitting: Urology

## 2022-02-15 VITALS — BP 133/76 | HR 77 | Ht 70.0 in | Wt 240.0 lb

## 2022-02-15 DIAGNOSIS — R39198 Other difficulties with micturition: Secondary | ICD-10-CM | POA: Diagnosis not present

## 2022-02-15 DIAGNOSIS — R350 Frequency of micturition: Secondary | ICD-10-CM

## 2022-02-15 DIAGNOSIS — N5201 Erectile dysfunction due to arterial insufficiency: Secondary | ICD-10-CM | POA: Diagnosis not present

## 2022-02-15 DIAGNOSIS — R3915 Urgency of urination: Secondary | ICD-10-CM

## 2022-02-15 NOTE — Progress Notes (Signed)
? ?02/15/2022 ?1:36 PM  ? ?Levi Alvarez ?15-Aug-1971 ?[id number] ? ?Referring provider: Sonia Side., FNP ?[address] ?[place],  [place] [address] ? ?Chief Complaint  ?Patient presents with  ? Erectile Dysfunction  ? ? ?HPI: ?Levi Alvarez is a 51 y.o. male with history of alcoholism, hepatitis C and cirrhosis referred for evaluation of lower urinary tract symptoms and erectile dysfunction.  He presents today with his girlfriend. ? ?Admitted in [facility] for inpatient alcohol detox 3/13-3/20 ?Has not resumed alcohol consumption since discharge ?>5-year history of bothersome lower urinary tract symptoms which have worsened over the last year ?Most bothersome complaints at present are urinary frequency, urgency, weak urinary stream and sensation of incomplete emptying ?IPSS 24/35 ?Presently on tamsulosin 0.4 mg daily.  Had also been on finasteride which was recently discontinued and prazosin 1 mg was added ?No urge incontinence ?Denies dysuria, gross hematuria ? ?Also complains of difficulty achieving and maintaining an erection.  No partial erections that are firm enough for penetration ?Has tried both sildenafil and tadalafil without efficacy ?Organic risk factors include chronic alcohol use/cirrhosis, hypertension, antihypertensive medications and tobacco use ? ? ? ?PMH: ?Past Medical History:  ?Diagnosis Date  ? Alcoholism ([place])   ? Blood transfusion without reported diagnosis   ? jan, 2020 3 units PRBC  ? Cirrhosis ([place])   ? secondary to alcohol and hep C  ? Constipation   ? Resolved  ? COPD (chronic obstructive pulmonary disease) ([place])   ? DDD (degenerative disc disease), cervical   ? ED (erectile dysfunction)   ? Esophageal varices (H[facility])   ? GERD (gastroesophageal reflux disease)   ? H/O: upper GI bleed   ? Hepatitis C antibody positive in blood   ? resolved  ? High blood pressure   ? History of anemia   ? Left inguinal hernia 2020  ? Lung nodule   ? Small  ? Sleep apnea   ? trying to get used to his  CPAP, not wearing regularly  ? Umbilical hernia 8657  ? ? ?Surgical History: ?Past Surgical History:  ?Procedure Laterality Date  ? arm surgery    ? left arm,  ? COLONOSCOPY WITH PROPOFOL N/A 01/19/2019  ? Procedure: COLONOSCOPY WITH PROPOFOL;  Surgeon: Yetta Flock, MD;  [facility];  Service: Gastroenterology;  Laterality: N/A;  ? ESOPHAGEAL BANDING  11/25/2018  ? Procedure: ESOPHAGEAL BANDING;  Surgeon: Milus Banister, MD;  [facility];  Service: Endoscopy;;  ? ESOPHAGEAL BANDING  01/19/2019  ? Procedure: ESOPHAGEAL BANDING;  Surgeon: Yetta Flock, MD;  Location[facility];  Service: Gastroenterology;;  ? ESOPHAGEAL BANDING N/A 01/24/2021  ? Procedure: ESOPHAGEAL BANDING;  Surgeon: Yetta Flock, MD;  Location: [facility];  Service: Gastroenterology;  Laterality: N/A;  ? ESOPHAGOGASTRODUODENOSCOPY (EGD) WITH PROPOFOL N/A 11/25/2018  ? Procedure: ESOPHAGOGASTRODUODENOSCOPY (EGD) WITH PROPOFOL;  Surgeon: Milus Banister, MD;  [facility];  Service: Endoscopy;  Laterality: N/A;  ? ESOPHAGOGASTRODUODENOSCOPY (EGD) WITH PROPOFOL N/A 01/19/2019  ? Procedure: ESOPHAGOGASTRODUODENOSCOPY (EGD) WITH PROPOFOL;  Surgeon: Yetta Flock, MD;  [facility];  Service: Gastroenterology;  Laterality: N/A;  ? ESOPHAGOGASTRODUODENOSCOPY (EGD) WITH PROPOFOL N/A 04/28/2019  ? Procedure: ESOPHAGOGASTRODUODENOSCOPY (EGD) WITH PROPOFOL;  Surgeon: Yetta Flock, MD;  [facility];  Service: Gastroenterology;  Laterality: N/A;  ? ESOPHAGOGASTRODUODENOSCOPY (EGD) WITH PROPOFOL N/A 11/03/2019  ? Procedure: ESOPHAGOGASTRODUODENOSCOPY (EGD) WITH PROPOFOL;  Surgeon: Doran Stabler, MD;  [facility];  Service: Gastroenterology;  Laterality: N/A;  ?  ESOPHAGOGASTRODUODENOSCOPY (EGD) WITH PROPOFOL N/A 01/24/2021  ? Procedure: ESOPHAGOGASTRODUODENOSCOPY (EGD) WITH PROPOFOL;  Surgeon: Yetta Flock, MD;  [facility];  Service:  Gastroenterology;  Laterality: N/A;  ? KNEE SURGERY Left   ? POLYPECTOMY  01/19/2019  ? Procedure: POLYPECTOMY;  Surgeon: Yetta Flock, MD;  Location: [facility];  Service: Gastroenterology;;  ? ? ?Home Medications:  ?Allergies as of 02/15/2022   ? ?   Reactions  ? Onion Anaphylaxis  ? Gabapentin Other (See Comments)  ? Restless legs  ? Naproxen Hives, Swelling  ? ?  ? ?  ?Medication List  ?  ? ?  ? Accurate as of February 15, 2022  1:36 PM. If you have any questions, ask your nurse or doctor.  ?  ?  ? ?  ? ?atenolol 50 MG tablet ?Commonly known as: TENORMIN ?Take 1 tablet (50 mg total) by mouth daily. ?  ?azelastine 0.1 % nasal spray ?Commonly known as: ASTELIN ?Place 1 spray into both nostrils daily as needed for allergies. ?  ?fluticasone furoate-vilanterol 200-25 MCG/ACT Aepb ?Commonly known as: Breo Ellipta ?Inhale 1 puff into the lungs daily. ?  ?folic acid 1 MG tablet ?Commonly known as: FOLVITE ?Take 1 mg by mouth in the morning. ?  ?loratadine 10 MG tablet ?Commonly known as: CLARITIN ?Take 10 mg by mouth daily. ?  ?multivitamin with minerals Tabs tablet ?Take 1 tablet by mouth daily with breakfast. ?  ?naltrexone 50 MG tablet ?Commonly known as: DEPADE ?Take 1 tablet (50 mg total) by mouth daily. ?  ?nicotine 21 mg/24hr patch ?Commonly known as: NICODERM CQ - dosed in mg/24 hours ?Place 1 patch (21 mg total) onto the skin daily. ?  ?pantoprazole 40 MG tablet ?Commonly known as: PROTONIX ?Take 1 tablet (40 mg total) by mouth daily. ?  ?PHENObarbital 30 MG tablet ?Commonly known as: LUMINAL ?Take 1-2 tablets (30-60 mg total) by mouth at bedtime. As needed for sleep, anxiety, agitation ?  ?polyethylene glycol 17 g packet ?Commonly known as: MIRALAX / GLYCOLAX ?Take 17 g by mouth daily. ?  ?prazosin 1 MG capsule ?Commonly known as: MINIPRESS ?Take 1 capsule (1 mg total) by mouth at bedtime. ?  ?sildenafil 50 MG tablet ?Commonly known as: VIAGRA ?Take 50 mg by mouth daily as needed for erectile dysfunction. ?   ?tamsulosin 0.4 MG Caps capsule ?Commonly known as: FLOMAX ?Take 1 capsule (0.4 mg total) by mouth daily. ?  ?traZODone 50 MG tablet ?Commonly known as: DESYREL ?Take 1 tablet (50 mg total) by mouth at bedtime. For sleep ?  ?venlafaxine XR 75 MG 24 hr capsule ?Commonly known as: EFFEXOR-XR ?TAKE 1 CAPSULE BY MOUTH DAILY. ?What changed: how much to take ?  ?Ventolin HFA 108 (90 Base) MCG/ACT inhaler ?Generic drug: albuterol ?INHALE 1 PUFF EVERY 6 (SIX) HOURS AS NEEDED INTO THE LUNGS FOR WHEEZING OR SHORTNESS OF BREATH. ?What changed: See the new instructions. ?  ?Vitamin B1 100 MG Tabs ?Take 1 tablet (100 mg total) by mouth in the morning. ?  ? ?  ? ? ?Allergies:  ?Allergies  ?Allergen Reactions  ? Onion Anaphylaxis  ? Gabapentin Other (See Comments)  ?  Restless legs  ? Naproxen Hives and Swelling  ? ? ?Family History: ?Family History  ?Problem Relation Age of Onset  ? Colitis Mother   ? Arthritis Mother   ? Dementia Mother   ? Cancer Father   ?     lymphoma?  ? Cancer Paternal Grandfather   ?  bone  ? Heart disease Neg Hx   ? Stroke Neg Hx   ? Diabetes Neg Hx   ? ? ?Social History:  reports that he has been smoking cigarettes. He has a 15.00 pack-year smoking history. He has quit using smokeless tobacco.  His smokeless tobacco use included chew. He reports current alcohol use of about 36.0 standard drinks per week. He reports current drug use. Drug: Marijuana. ? ? ?Physical Exam: ?BP 133/76   Pulse 77   Ht '5\' 10"'$  (1.778 m)   Wt 240 lb (108.9 kg)   BMI 34.44 kg/m?   ?Constitutional:  Alert and oriented, No acute distress. ?HEENT: [place] AT, moist mucus membranes.  Trachea midline, no masses. ?Cardiovascular: No clubbing, cyanosis, or edema. ?Respiratory: Normal respiratory effort, no increased work of breathing. ?GU: Prostate 35 g, smooth without nodules ?Skin: No rashes, bruises or suspicious lesions. ?Neurologic: Grossly intact, no focal deficits, moving all 4 extremities. ?Psychiatric: Normal mood and  affect. ? ? ?Assessment & Plan:   ? ?1.  Lower urinary tract symptoms ?Severe lower urinary tract symptoms ?Most bothersome symptoms are storage related and we discussed this could be secondary to BPH although primary d

## 2022-02-17 ENCOUNTER — Other Ambulatory Visit: Payer: Medicare (Managed Care)

## 2022-02-17 ENCOUNTER — Encounter: Payer: Self-pay | Admitting: Urology

## 2022-02-24 ENCOUNTER — Ambulatory Visit (HOSPITAL_COMMUNITY)
Admission: RE | Admit: 2022-02-24 | Discharge: 2022-02-24 | Disposition: A | Discharge status: Home / Self Care | Payer: Medicare (Managed Care) | Source: Ambulatory Visit | Attending: Neurological Surgery | Admitting: Neurological Surgery

## 2022-02-24 DIAGNOSIS — M5412 Radiculopathy, cervical region: Secondary | ICD-10-CM | POA: Diagnosis present

## 2022-03-08 ENCOUNTER — Ambulatory Visit: Payer: Medicare (Managed Care) | Admitting: Physician Assistant

## 2022-03-08 ENCOUNTER — Encounter: Payer: Self-pay | Admitting: Physician Assistant

## 2022-03-19 ENCOUNTER — Ambulatory Visit (INDEPENDENT_AMBULATORY_CARE_PROVIDER_SITE_OTHER): Payer: Medicare Other | Admitting: Physician Assistant

## 2022-03-19 ENCOUNTER — Encounter: Payer: Self-pay | Admitting: Physician Assistant

## 2022-03-19 VITALS — BP 129/76 | HR 64 | Ht 70.0 in | Wt 222.0 lb

## 2022-03-19 DIAGNOSIS — R39198 Other difficulties with micturition: Secondary | ICD-10-CM | POA: Diagnosis not present

## 2022-03-19 DIAGNOSIS — R3915 Urgency of urination: Secondary | ICD-10-CM

## 2022-03-19 LAB — BLADDER SCAN AMB NON-IMAGING

## 2022-03-19 MED ORDER — MIRABEGRON ER 50 MG PO TB24: 50.0000 mg | ORAL_TABLET | Freq: Every day | ORAL | 0 refills | Status: DC

## 2022-03-19 NOTE — Progress Notes (Signed)
? ?03/19/2022 ?2:17 PM  ? ?[name] ?29-Sep-1971 ?[id number] ? ?CC: ?Chief Complaint  ?Patient presents with  ? Follow-up  ? ?HPI: ?Levi Alvarez is a 51 y.o. 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  ? ?Today he reports he initially thought the Myrbetriq was helping with his urinary symptoms, however he quickly returned to baseline on this medication.  Overall, he does not think it made a difference.  He denies dysuria or gross hematuria in the past month.  PVR 27 mL. ? ?He is not sure how he would like to proceed regarding his ED treatment. ? ?PMH: ?Past Medical History:  ?Diagnosis Date  ? Alcoholism ([place])   ? Blood transfusion without reported diagnosis   ? jan, 2020 3 units PRBC  ? Cirrhosis ([place])   ? secondary to alcohol and hep C  ? Constipation   ? Resolved  ? COPD (chronic obstructive pulmonary disease) ([place])   ? DDD (degenerative disc disease), cervical   ? ED (erectile dysfunction)   ? Esophageal varices ([facility])   ? GERD (gastroesophageal reflux disease)   ? H/O: upper GI bleed   ? Hepatitis C antibody positive in blood   ? resolved  ? High blood pressure   ? History of anemia   ? Left inguinal hernia 2020  ? Lung nodule   ? Small  ? Sleep apnea   ? trying to get used to his CPAP, not wearing regularly  ? Umbilical hernia 7829  ? ? ?Surgical History: ?Past Surgical History:  ?Procedure Laterality Date  ? arm surgery    ? left arm,  ? COLONOSCOPY WITH PROPOFOL N/A 01/19/2019  ? Procedure: COLONOSCOPY WITH PROPOFOL;  Surgeon: Yetta Flock, MD;  [facility];  Service: Gastroenterology;  Laterality: N/A;  ? ESOPHAGEAL BANDING  11/25/2018  ? Procedure: ESOPHAGEAL BANDING;  Surgeon: Milus Banister, MD;  [facility];  Service: Endoscopy;;  ? ESOPHAGEAL BANDING  01/19/2019  ?  Procedure: ESOPHAGEAL BANDING;  Surgeon: Yetta Flock, MD;  Location[facility];  Service: Gastroenterology;;  ? ESOPHAGEAL BANDING N/A 01/24/2021  ? Procedure: ESOPHAGEAL BANDING;  Surgeon: Yetta Flock, MD;  Location[facility];  Service: Gastroenterology;  Laterality: N/A;  ? ESOPHAGOGASTRODUODENOSCOPY (EGD) WITH PROPOFOL N/A 11/25/2018  ? Procedure: ESOPHAGOGASTRODUODENOSCOPY (EGD) WITH PROPOFOL;  Surgeon: Milus Banister, MD;  [facility];  Service: Endoscopy;  Laterality: N/A;  ? ESOPHAGOGASTRODUODENOSCOPY (EGD) WITH PROPOFOL N/A 01/19/2019  ? Procedure: ESOPHAGOGASTRODUODENOSCOPY (EGD) WITH PROPOFOL;  Surgeon: Yetta Flock, MD;  [facility];  Service: Gastroenterology;  Laterality: N/A;  ? ESOPHAGOGASTRODUODENOSCOPY (EGD) WITH PROPOFOL N/A 04/28/2019  ? Procedure: ESOPHAGOGASTRODUODENOSCOPY (EGD) WITH PROPOFOL;  Surgeon: Yetta Flock, MD;  [facility];  Service: Gastroenterology;  Laterality: N/A;  ? ESOPHAGOGASTRODUODENOSCOPY (EGD) WITH PROPOFOL N/A 11/03/2019  ? Procedure: ESOPHAGOGASTRODUODENOSCOPY (EGD) WITH PROPOFOL;  Surgeon: Doran Stabler, MD;  [facility];  Service: Gastroenterology;  Laterality: N/A;  ? ESOPHAGOGASTRODUODENOSCOPY (EGD) WITH PROPOFOL N/A 01/24/2021  ? Procedure: ESOPHAGOGASTRODUODENOSCOPY (EGD) WITH PROPOFOL;  Surgeon: Yetta Flock, MD;  [facility];  Service: Gastroenterology;  Laterality: N/A;  ? KNEE SURGERY Left   ? POLYPECTOMY  01/19/2019  ? Procedure: POLYPECTOMY;  Surgeon: Yetta Flock, MD;  Location: [facility];  Service: Gastroenterology;;  ? ? ?  Home Medications:  ?Allergies as of 03/19/2022   ? ?   Reactions  ? Onion Anaphylaxis  ? Gabapentin Other (See Comments)  ? Restless legs  ? Naproxen Hives, Swelling  ? ?  ? ?  ?Medication List  ?  ? ?  ? Accurate as of Mar 19, 2022  2:17 PM. If you have any questions, ask your nurse or doctor.  ?  ?  ? ?  ? ?atenolol 50 MG  tablet ?Commonly known as: TENORMIN ?Take 1 tablet (50 mg total) by mouth daily. ?  ?azelastine 0.1 % nasal spray ?Commonly known as: ASTELIN ?Place 1 spray into both nostrils daily as needed for allergies. ?  ?fluticasone furoate-vilanterol 200-25 MCG/ACT Aepb ?Commonly known as: Breo Ellipta ?Inhale 1 puff into the lungs daily. ?  ?folic acid 1 MG tablet ?Commonly known as: FOLVITE ?Take 1 mg by mouth in the morning. ?  ?loratadine 10 MG tablet ?Commonly known as: CLARITIN ?Take 10 mg by mouth daily. ?  ?mirabegron ER 50 MG Tb24 tablet ?Commonly known as: MYRBETRIQ ?Take 1 tablet (50 mg total) by mouth daily. ?Started by: Debroah Loop, PA-C ?  ?multivitamin with minerals Tabs tablet ?Take 1 tablet by mouth daily with breakfast. ?  ?naltrexone 50 MG tablet ?Commonly known as: DEPADE ?Take 1 tablet (50 mg total) by mouth daily. ?  ?nicotine 21 mg/24hr patch ?Commonly known as: NICODERM CQ - dosed in mg/24 hours ?Place 1 patch (21 mg total) onto the skin daily. ?  ?pantoprazole 40 MG tablet ?Commonly known as: PROTONIX ?Take 1 tablet (40 mg total) by mouth daily. ?  ?PHENObarbital 30 MG tablet ?Commonly known as: LUMINAL ?Take 1-2 tablets (30-60 mg total) by mouth at bedtime. As needed for sleep, anxiety, agitation ?  ?polyethylene glycol 17 g packet ?Commonly known as: MIRALAX / GLYCOLAX ?Take 17 g by mouth daily. ?  ?prazosin 1 MG capsule ?Commonly known as: MINIPRESS ?Take 1 capsule (1 mg total) by mouth at bedtime. ?  ?sildenafil 50 MG tablet ?Commonly known as: VIAGRA ?Take 50 mg by mouth daily as needed for erectile dysfunction. ?  ?tamsulosin 0.4 MG Caps capsule ?Commonly known as: FLOMAX ?Take 1 capsule (0.4 mg total) by mouth daily. ?  ?traZODone 50 MG tablet ?Commonly known as: DESYREL ?Take 1 tablet (50 mg total) by mouth at bedtime. For sleep ?  ?venlafaxine XR 75 MG 24 hr capsule ?Commonly known as: EFFEXOR-XR ?TAKE 1 CAPSULE BY MOUTH DAILY. ?What changed: how much to take ?  ?Ventolin HFA 108  (90 Base) MCG/ACT inhaler ?Generic drug: albuterol ?INHALE 1 PUFF EVERY 6 (SIX) HOURS AS NEEDED INTO THE LUNGS FOR WHEEZING OR SHORTNESS OF BREATH. ?What changed: See the new instructions. ?  ?Vitamin B1 100 MG Tabs ?Take 1 tablet (100 mg total) by mouth in the morning. ?  ? ?  ? ? ?Allergies:  ?Allergies  ?Allergen Reactions  ? Onion Anaphylaxis  ? Gabapentin Other (See Comments)  ?  Restless legs  ? Naproxen Hives and Swelling  ? ? ?Family History: ?Family History  ?Problem Relation Age of Onset  ? Colitis Mother   ? Arthritis Mother   ? Dementia Mother   ? Cancer Father   ?     lymphoma?  ? Cancer Paternal Grandfather   ?     bone  ? Heart disease Neg Hx   ? Stroke Neg Hx   ? Diabetes Neg Hx   ? ? ?Social History:  ? reports that he  has been smoking cigarettes. He has a 15.00 pack-year smoking history. He has quit using smokeless tobacco.  His smokeless tobacco use included chew. He reports current alcohol use of about 36.0 standard drinks per week. He reports current drug use. Drug: Marijuana. ? ?Physical Exam: ?BP 129/76   Pulse 64   Ht '5\' 10"'$  (1.778 m)   Wt 222 lb (100.7 kg)   BMI 31.85 kg/m?   ?Constitutional:  Alert and oriented, no acute distress, nontoxic appearing ?HEENT: [place], AT ?Cardiovascular: No clubbing, cyanosis, or edema ?Respiratory: Normal respiratory effort, no increased work of breathing ?Skin: No rashes, bruises or suspicious lesions ?Neurologic: Grossly intact, no focal deficits, moving all 4 extremities ?Psychiatric: Normal mood and affect ? ?Laboratory Data: ?Results for orders placed or performed in visit on 03/19/22  ?Bladder Scan (Post Void Residual) in office  ?Result Value Ref Range  ? Scan Result 62m   ? ?Assessment & Plan:   ?1. Urinary urgency ?No improvement on Myrbetriq 25 mg daily.  Discussed increasing the dose to 50 mg daily.  Samples provided today.  We will plan for symptom recheck and PVR in 4 weeks. ?- Bladder Scan (Post Void Residual) in office ?- mirabegron ER  (MYRBETRIQ) 50 MG TB24 tablet; Take 1 tablet (50 mg total) by mouth daily.  Dispense: 28 tablet; Refill: 0 ? ?Return in about 4 weeks (around 04/16/2022) for Symptom recheck with PVR. ? ?SDebroah Loop PA-C ? ?[place]

## 2022-03-23 ENCOUNTER — Encounter: Payer: Self-pay | Admitting: Gastroenterology

## 2022-04-17 ENCOUNTER — Ambulatory Visit: Payer: Medicare Other | Admitting: Physician Assistant

## 2022-04-20 ENCOUNTER — Ambulatory Visit (INDEPENDENT_AMBULATORY_CARE_PROVIDER_SITE_OTHER): Payer: Medicare Other | Admitting: Physician Assistant

## 2022-04-20 ENCOUNTER — Encounter: Payer: Self-pay | Admitting: Physician Assistant

## 2022-04-20 VITALS — BP 116/69 | HR 56 | Ht 70.0 in | Wt 225.0 lb

## 2022-04-20 DIAGNOSIS — R350 Frequency of micturition: Secondary | ICD-10-CM | POA: Diagnosis not present

## 2022-04-20 LAB — BLADDER SCAN AMB NON-IMAGING

## 2022-04-20 MED ORDER — GEMTESA 75 MG PO TABS: 75.0000 mg | ORAL_TABLET | Freq: Every day | ORAL | 0 refills | Status: DC

## 2022-04-20 NOTE — Progress Notes (Signed)
04/20/2022 5:13 PM   Levi Alvarez [birth year] [id number]  CC: Chief Complaint  Patient presents with   Follow-up    4 week follow-up   HPI: Levi Alvarez is a 51 y.o. 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  He had previously reported failing Myrbetriq 25 mg daily, so we discussed increasing his dose.  Today he reports he had originally been on Myrbetriq 50 mg, so he took the additional samples of the same dose and continues to not notice a significant improvement in his symptoms.   PMH: Past Medical History:  Diagnosis Date   Alcoholism ([place])    Blood transfusion without reported diagnosis    jan, 2020 3 units PRBC   Cirrhosis ([place])    secondary to alcohol and hep C   Constipation    Resolved   COPD (chronic obstructive pulmonary disease) ([facility])    DDD (degenerative disc disease), cervical    ED (erectile dysfunction)    Esophageal varices ([facility])    GERD (gastroesophageal reflux disease)    H/O: upper GI bleed    Hepatitis C antibody positive in blood    resolved   High blood pressure    History of anemia    Left inguinal hernia 2020   Lung nodule    Small   Sleep apnea    trying to get used to his CPAP, not wearing regularly   Umbilical hernia 6213    Surgical History: Past Surgical History:  Procedure Laterality Date   arm surgery     left arm,   COLONOSCOPY WITH PROPOFOL N/A 01/19/2019   Procedure: COLONOSCOPY WITH PROPOFOL;  Surgeon: Yetta Flock, MD;  [facility];  Service: Gastroenterology;  Laterality: N/A;   ESOPHAGEAL BANDING  11/25/2018   Procedure: ESOPHAGEAL BANDING;  Surgeon: Milus Banister, MD;  [facility];  Service: Endoscopy;;   ESOPHAGEAL BANDING  01/19/2019   Procedure: ESOPHAGEAL BANDING;  Surgeon: Yetta Flock, MD;  [facility];  Service: Gastroenterology;;   ESOPHAGEAL BANDING N/A 01/24/2021   Procedure: ESOPHAGEAL BANDING;  Surgeon: Yetta Flock, MD;  [facility];  Service: Gastroenterology;  Laterality: N/A;   ESOPHAGOGASTRODUODENOSCOPY (EGD) WITH PROPOFOL N/A 11/25/2018   Procedure: ESOPHAGOGASTRODUODENOSCOPY (EGD) WITH PROPOFOL;  Surgeon: Milus Banister, MD;  [facility];  Service: Endoscopy;  Laterality: N/A;   ESOPHAGOGASTRODUODENOSCOPY (EGD) WITH PROPOFOL N/A 01/19/2019   Procedure: ESOPHAGOGASTRODUODENOSCOPY (EGD) WITH PROPOFOL;  Surgeon: Yetta Flock, MD;  [facility];  Service: Gastroenterology;  Laterality: N/A;   ESOPHAGOGASTRODUODENOSCOPY (EGD) WITH PROPOFOL N/A 04/28/2019   Procedure: ESOPHAGOGASTRODUODENOSCOPY (EGD) WITH PROPOFOL;  Surgeon: Yetta Flock, MD;  [facility];  Service: Gastroenterology;  Laterality: N/A;   ESOPHAGOGASTRODUODENOSCOPY (EGD) WITH PROPOFOL N/A 11/03/2019   Procedure: ESOPHAGOGASTRODUODENOSCOPY (EGD) WITH PROPOFOL;  Surgeon: Doran Stabler, MD;  [facility];  Service: Gastroenterology;  Laterality: N/A;   ESOPHAGOGASTRODUODENOSCOPY (EGD) WITH PROPOFOL N/A 01/24/2021   Procedure: ESOPHAGOGASTRODUODENOSCOPY (EGD) WITH PROPOFOL;  Surgeon: Yetta Flock, MD;  [facility];  Service: Gastroenterology;  Laterality: N/A;   KNEE SURGERY Left    POLYPECTOMY  01/19/2019   Procedure: POLYPECTOMY;  Surgeon: Yetta Flock, MD;  [facility];  Service: Gastroenterology;;    Home Medications:  Allergies as of 04/20/2022  Reactions   Onion Anaphylaxis   Gabapentin Other (See Comments)   Restless legs   Naproxen Hives, Swelling        Medication List        Accurate as of April 20, 2022  5:13 PM. If you have any questions, ask your nurse or doctor.          STOP taking these medications    mirabegron ER 50 MG Tb24  tablet Commonly known as: MYRBETRIQ Stopped by: Debroah Loop, PA-C       TAKE these medications    atenolol 50 MG tablet Commonly known as: TENORMIN Take 1 tablet (50 mg total) by mouth daily.   azelastine 0.1 % nasal spray Commonly known as: ASTELIN Place 1 spray into both nostrils daily as needed for allergies.   fluticasone furoate-vilanterol 200-25 MCG/ACT Aepb Commonly known as: Breo Ellipta Inhale 1 puff into the lungs daily.   folic acid 1 MG tablet Commonly known as: FOLVITE Take 1 mg by mouth in the morning.   Gemtesa 75 MG Tabs Generic drug: Vibegron Take 75 mg by mouth daily. Started by: Debroah Loop, PA-C   loratadine 10 MG tablet Commonly known as: CLARITIN Take 10 mg by mouth daily.   multivitamin with minerals Tabs tablet Take 1 tablet by mouth daily with breakfast.   naltrexone 50 MG tablet Commonly known as: DEPADE Take 1 tablet (50 mg total) by mouth daily.   nicotine 21 mg/24hr patch Commonly known as: NICODERM CQ - dosed in mg/24 hours Place 1 patch (21 mg total) onto the skin daily.   pantoprazole 40 MG tablet Commonly known as: PROTONIX Take 1 tablet (40 mg total) by mouth daily.   PHENObarbital 30 MG tablet Commonly known as: LUMINAL Take 1-2 tablets (30-60 mg total) by mouth at bedtime. As needed for sleep, anxiety, agitation   polyethylene glycol 17 g packet Commonly known as: MIRALAX / GLYCOLAX Take 17 g by mouth daily.   prazosin 1 MG capsule Commonly known as: MINIPRESS Take 1 capsule (1 mg total) by mouth at bedtime.   sildenafil 50 MG tablet Commonly known as: VIAGRA Take 50 mg by mouth daily as needed for erectile dysfunction.   tamsulosin 0.4 MG Caps capsule Commonly known as: FLOMAX Take 1 capsule (0.4 mg total) by mouth daily.   traZODone 50 MG tablet Commonly known as: DESYREL Take 1 tablet (50 mg total) by mouth at bedtime. For sleep   venlafaxine XR 75 MG 24 hr capsule Commonly known as:  EFFEXOR-XR TAKE 1 CAPSULE BY MOUTH DAILY.   Ventolin HFA 108 (90 Base) MCG/ACT inhaler Generic drug: albuterol INHALE 1 PUFF EVERY 6 (SIX) HOURS AS NEEDED INTO THE LUNGS FOR WHEEZING OR SHORTNESS OF BREATH.   Vitamin B1 100 MG Tabs Take 1 tablet (100 mg total) by mouth in the morning.        Allergies:  Allergies  Allergen Reactions   Onion Anaphylaxis   Gabapentin Other (See Comments)    Restless legs   Naproxen Hives and Swelling    Family History: Family History  Problem Relation Age of Onset   Colitis Mother    Arthritis Mother    Dementia Mother    Cancer Father        lymphoma?   Cancer Paternal Grandfather        bone   Heart disease Neg Hx    Stroke Neg Hx    Diabetes Neg Hx     Social History:  reports that he has been smoking cigarettes. He has a 15.00 pack-year smoking history. He has been exposed to tobacco smoke. He has quit using smokeless tobacco.  His smokeless tobacco use included chew. He reports current alcohol use of about 36.0 standard drinks of alcohol per week. He reports current drug use. Drug: Marijuana.  Physical Exam: BP 116/69   Pulse (!) 56   Ht '5\' 10"'$  (1.778 m)   Wt 225 lb (102.1 kg)   BMI 32.28 kg/m   Constitutional:  Alert and oriented, no acute distress, nontoxic appearing HEENT: [place], AT Cardiovascular: No clubbing, cyanosis, or edema Respiratory: Normal respiratory effort, no increased work of breathing Skin: No rashes, bruises or suspicious lesions Neurologic: Grossly intact, no focal deficits, moving all 4 extremities Psychiatric: Normal mood and affect  Laboratory Data: Results for orders placed or performed in visit on 04/20/22  BLADDER SCAN AMB NON-IMAGING  Result Value Ref Range   Scan Result 165m    Assessment & Plan:   1. Urinary frequency Myrbetriq 50 mg failed.  We discussed trying an alternative medication and he agreed.  We will try Gemtesa x4 weeks and plan for symptom recheck and PVR upon completion. -  BLADDER SCAN AMB NON-IMAGING - Vibegron (GEMTESA) 75 MG TABS; Take 75 mg by mouth daily.  Dispense: 28 tablet; Refill: 0   Return in about 4 weeks (around 05/18/2022) for Symptom recheck with PVR.  SDebroah Loop PA-C  [facility] [address] [place] [phone]

## 2022-04-26 ENCOUNTER — Inpatient Hospital Stay: Admission: RE | Admit: 2022-04-26 | Payer: Medicare (Managed Care) | Source: Ambulatory Visit

## 2022-05-01 ENCOUNTER — Ambulatory Visit
Admission: RE | Admit: 2022-05-01 | Discharge: 2022-05-01 | Disposition: A | Discharge status: Home / Self Care | Payer: Medicare Other | Source: Ambulatory Visit | Attending: Gastroenterology | Admitting: Gastroenterology

## 2022-05-01 ENCOUNTER — Telehealth: Payer: Self-pay

## 2022-05-01 DIAGNOSIS — K7460 Unspecified cirrhosis of liver: Secondary | ICD-10-CM

## 2022-05-01 NOTE — Telephone Encounter (Signed)
Patient had [place] today.

## 2022-05-01 NOTE — Telephone Encounter (Signed)
-----   Message from Yevette Edwards, RN sent at 10/26/2021 12:47 PM EST ----- Regarding: [place] RUQ [place] - Cirrhosis, [place] screening Order is in [place]

## 2022-05-02 ENCOUNTER — Other Ambulatory Visit: Payer: Self-pay

## 2022-05-02 DIAGNOSIS — K7030 Alcoholic cirrhosis of liver without ascites: Secondary | ICD-10-CM

## 2022-05-04 ENCOUNTER — Encounter: Payer: Self-pay | Admitting: *Deleted

## 2022-05-11 ENCOUNTER — Encounter: Payer: Self-pay | Admitting: Gastroenterology

## 2022-05-11 ENCOUNTER — Ambulatory Visit (INDEPENDENT_AMBULATORY_CARE_PROVIDER_SITE_OTHER): Payer: Medicare Other | Admitting: Gastroenterology

## 2022-05-11 VITALS — BP 118/74 | HR 55 | Wt 212.0 lb

## 2022-05-11 DIAGNOSIS — I8501 Esophageal varices with bleeding: Secondary | ICD-10-CM

## 2022-05-11 DIAGNOSIS — K7030 Alcoholic cirrhosis of liver without ascites: Secondary | ICD-10-CM

## 2022-05-11 DIAGNOSIS — I8511 Secondary esophageal varices with bleeding: Secondary | ICD-10-CM

## 2022-05-11 MED ORDER — PLENVU 140 G PO SOLR: 1.0000 | ORAL | 0 refills | Status: DC

## 2022-05-11 NOTE — Patient Instructions (Signed)
You have been scheduled for an endoscopy and colonoscopy. Please follow the written instructions given to you at your visit today. Please pick up your prep supplies at the pharmacy within the next 1-3 days. If you use inhalers (even only as needed), please bring them with you on the day of your procedure.  If you are age 50 or older, your body mass index should be between 23-30. Your Body mass index is 30.42 kg/m. If this is out of the aforementioned range listed, please consider follow up with your Primary Care Provider.  If you are age 31 or younger, your body mass index should be between 19-25. Your Body mass index is 30.42 kg/m. If this is out of the aformentioned range listed, please consider follow up with your Primary Care Provider.   ________________________________________________________  The [facility] providers would like to encourage you to use [facility] to communicate with providers for non-urgent requests or questions.  Due to long hold times on the telephone, sending your provider a message by [facility] may be a faster and more efficient way to get a response.  Please allow 48 business hours for a response.  Please remember that this is for non-urgent requests.  _______________________________________________________  Due to recent changes in healthcare laws, you may see the results of your imaging and laboratory studies on MyChart before your provider has had a chance to review them.  We understand that in some cases there may be results that are confusing or concerning to you. Not all laboratory results come back in the same time frame and the provider may be waiting for multiple results in order to interpret others.  Please give [place] 48 hours in order for your provider to thoroughly review all the results before contacting the office for clarification of your results.

## 2022-05-11 NOTE — Progress Notes (Signed)
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  Recall he was previously treated for hep C with undetectable viral load.  He has a history of bleeding esophageal varices in January 22 when he relapsed with alcohol, he underwent banding at that time and has had surveillance EGDs and on Coreg since then.   At our last visit in March he had endorsed relapsing with his alcohol use, he was drinking anywhere from 12-18 beers a day.  Had a lot of stress in his life taking care of his mother who has dementia etc.  He was admitted to the hospital shortly thereafter for alcohol detox.  He was in the hospital for about a week, underwent withdrawal symptoms, hallucinations, was in the ICU for several days.  Since that time has been completely sober from alcohol.  Doing a really good job in this light, using naloxone which is helping him maintain sobriety.  He states he has no desire to drink alcohol any longer.  He appears to be in good spirits today.  He had an ultrasound since his last visit which showed stable changes of cirrhosis.  He has had no decompensations since have last seen him.  No jaundice, no recurrent bleeding, no encephalopathy, no ascites.  Labs up-to-date from March, AFP normal.  He denies any cardiopulmonary symptoms.  Bowel habits stable.  He is overdue for surveillance colonoscopy for history of polyps and for reassessment of his varices.  He had small varices noted on his last EGD over a year ago.    He has been vaccinated to hep A and B and completed the series in 2019.    Prior workup: EGD 01/06/2018 - 2cm HH, no varices, portal hypertensive gastriits EGD 11/25/18 - large esophageal varices, one with red whale sign - banded x 6, severe portal hypertensive gastritis EGD 12/16/18 - grade II varices in the lower esophagus, procedure was aborted due to large volume of food in the stomach, no banding done   CT scan  12/11/2008 - cirrhotic appearing liver, ? Wall thickening of the ascending and proximal descending colon, small left inguinal and umbilical hernias   EGD 02/11/69 - Esophagogastric landmarks identified. - Grade II esophageal varix - overall improved since last exam. Banded x 3. - Mild portal hypertensive gastritis. - Normal duodenal bulb and second portion of the duodenum.   Colonoscopy - 01/19/19 - One 8 mm polyp in the cecum, removed with a cold snare. Resected and retrieved. - Two 5 to 6 mm polyps in the transverse colon, removed with a cold snare. Resected and retrieved. - One 5 mm polyp in the descending colon, removed with a cold snare. Resected and retrieved. - One 5 mm polyp in the sigmoid colon, removed with a cold snare. Resected and retrieved. - Mulitple suspected rectal hyperplastic polyps - two representative lesions removed with a cold snare. Resected and retrieved. - One 8 mm polyp in the rectum, removed with a hot snare. Resected and retrieved. - Diverticulosis in the transverse colon and in the ascending colon. - Tortous colon - Edema throughout, consistent with change from portal hypertension, I suspect the cause of CT changes. - Internal hemorrhoids. - The examination was otherwise normal.   1. Colon, polyp(s), cecum, transverse - TUBULAR ADENOMA. - SESSILE SERRATED POLYP WITHOUT DYSPLASIA (X5 FRAGMENTS). - NO HIGH GRADE DYSPLASIA OR MALIGNANCY. 2. Colon, polyp(s), sigmoid, rectal, descending - HYPERPLASTIC POLYP (X5 FRAGMENTS). -  NO DYSPLASIA OR MALIGNANCY.   EGD 04/28/19 - Esophagogastric landmarks identified. - Small esophageal varices that easily flattened with insufflation and stigmata of prior banding noted. No further banding performed today. - Normal stomach. - Normal duodenal bulb and second portion of the duodenum.   EGD 11/03/19 - Dr. Loletha Carrow. Grade I varices were found in the lower third of the esophagus. No banding required. Portal hypertensive  gastropathy was found in the entire examined stomach. A large amount of food (residue) was found in the entire examined stomach. This limited visualization. Retroflexion performed in stomach, but gastric cardia/fundus not well visualized as a result of retained Food. The examined duodenum was normal.     EGD 01/24/21 -  Trace varices were found in the lower third of the esophagus. They were small in size. Not amenable to banding. There was a protuberance of benign appearing mucosa at the distal esophagus I suspect scarring from prior banding The exam of the esophagus was otherwise normal. Mild portal hypertensive gastropathy was found in the gastric fundus and in the gastric body. The exam of the stomach was otherwise normal. The duodenal bulb and second portion of the duodenum were normal.   Echo 04/10/21 - EF 60-65%   [place] 05/01/22: IMPRESSION: 1. Heterogeneous hepatic parenchymal pattern. Irregular hepatic border. Findings consistent with cirrhosis. Similar findings noted on prior exam. No focal hepatic abnormality identified. Portal vein is patent with normal direction of flow.   2.  No gallstones or biliary distention.   Past Medical History:  Diagnosis Date   Alcoholism ([place])    Blood transfusion without reported diagnosis    jan, 2020 3 units PRBC   Cirrhosis ([place])    secondary to alcohol and hep C   Constipation    Resolved   COPD (chronic obstructive pulmonary disease) ([facility])    DDD (degenerative disc disease), cervical    Diverticulosis    ED (erectile dysfunction)    Esophageal varices ([facility])    GERD (gastroesophageal reflux disease)    H/O: upper GI bleed    Hepatitis C antibody positive in blood    resolved   High blood pressure    History of anemia    Internal hemorrhoids    Left inguinal hernia 2020   Lung nodule    Small   Portal hypertensive gastropathy ([facility])    Sleep apnea    trying to get used to his CPAP, not wearing regularly   Umbilical hernia [id number]      Past Surgical History:  Procedure Laterality Date   arm surgery     left arm,   COLONOSCOPY WITH PROPOFOL N/A 01/19/2019   Procedure: COLONOSCOPY WITH PROPOFOL;  Surgeon: Yetta Flock, MD;  Location: [facility];  Service: Gastroenterology;  Laterality: N/A;   ESOPHAGEAL BANDING  11/25/2018   Procedure: ESOPHAGEAL BANDING;  Surgeon: Milus Banister, MD;  Location: [facility];  Service: Endoscopy;;   ESOPHAGEAL BANDING  01/19/2019   Procedure: ESOPHAGEAL BANDING;  Surgeon: Yetta Flock, MD;  Location: [facility];  Service: Gastroenterology;;   ESOPHAGEAL BANDING N/A 01/24/2021   Procedure: ESOPHAGEAL BANDING;  Surgeon: Yetta Flock, MD;  Location: [facility];  Service: Gastroenterology;  Laterality: N/A;   ESOPHAGOGASTRODUODENOSCOPY (EGD) WITH PROPOFOL N/A 11/25/2018   Procedure: ESOPHAGOGASTRODUODENOSCOPY (EGD) WITH PROPOFOL;  Surgeon: Milus Banister, MD;  Location: [facility];  Service: Endoscopy;  Laterality: N/A;   ESOPHAGOGASTRODUODENOSCOPY (EGD) WITH PROPOFOL N/A 01/19/2019   Procedure: ESOPHAGOGASTRODUODENOSCOPY (EGD) WITH PROPOFOL;  Surgeon: Yetta Flock,  MD;  Location: [facility];  Service: Gastroenterology;  Laterality: N/A;   ESOPHAGOGASTRODUODENOSCOPY (EGD) WITH PROPOFOL N/A 04/28/2019   Procedure: ESOPHAGOGASTRODUODENOSCOPY (EGD) WITH PROPOFOL;  Surgeon: Yetta Flock, MD;  Location: [facility];  Service: Gastroenterology;  Laterality: N/A;   ESOPHAGOGASTRODUODENOSCOPY (EGD) WITH PROPOFOL N/A 11/03/2019   Procedure: ESOPHAGOGASTRODUODENOSCOPY (EGD) WITH PROPOFOL;  Surgeon: Doran Stabler, MD;  Location: [facility];  Service: Gastroenterology;  Laterality: N/A;   ESOPHAGOGASTRODUODENOSCOPY (EGD) WITH PROPOFOL N/A 01/24/2021   Procedure: ESOPHAGOGASTRODUODENOSCOPY (EGD) WITH PROPOFOL;  Surgeon: Yetta Flock, MD;  Location: [facility];  Service: Gastroenterology;  Laterality: N/A;   KNEE SURGERY Left    POLYPECTOMY   01/19/2019   Procedure: POLYPECTOMY;  Surgeon: Yetta Flock, MD;  Location: [facility];  Service: Gastroenterology;;   Family History  Problem Relation Age of Onset   Colitis Mother    Arthritis Mother    Dementia Mother    Cancer Father        lymphoma?   Cancer Paternal Grandfather        bone   Heart disease Neg Hx    Stroke Neg Hx    Diabetes Neg Hx    Social History   Tobacco Use   Smoking status: Every Day    Packs/day: 0.50    Years: 30.00    Total pack years: 15.00    Types: Cigarettes    Passive exposure: Current   Smokeless tobacco: Former    Types: Nurse, children's Use: Never used  Substance Use Topics   Alcohol use: Yes    Alcohol/week: 36.0 standard drinks of alcohol    Types: 36 Cans of beer per week   Drug use: Yes    Types: Marijuana    Comment: daily use   Current Outpatient Medications  Medication Sig Dispense Refill   atenolol (TENORMIN) 50 MG tablet Take 1 tablet (50 mg total) by mouth daily. 30 tablet 6   azelastine (ASTELIN) 0.1 % nasal spray Place 1 spray into both nostrils daily as needed for allergies.     fluticasone furoate-vilanterol (BREO ELLIPTA) 200-25 MCG/ACT AEPB Inhale 1 puff into the lungs daily. 878 each 3   folic acid (FOLVITE) 1 MG tablet Take 1 mg by mouth in the morning.     loratadine (CLARITIN) 10 MG tablet Take 10 mg by mouth daily.     Multiple Vitamin (MULTIVITAMIN WITH MINERALS) TABS tablet Take 1 tablet by mouth daily with breakfast.     naltrexone (DEPADE) 50 MG tablet Take 1 tablet (50 mg total) by mouth daily. 30 tablet 0   nicotine (NICODERM CQ - DOSED IN MG/24 HOURS) 21 mg/24hr patch Place 1 patch (21 mg total) onto the skin daily. 28 patch 3   pantoprazole (PROTONIX) 40 MG tablet Take 1 tablet (40 mg total) by mouth daily.     PHENObarbital (LUMINAL) 30 MG tablet Take 1-2 tablets (30-60 mg total) by mouth at bedtime. As needed for sleep, anxiety, agitation 60 tablet 3   polyethylene glycol (MIRALAX  / GLYCOLAX) 17 g packet Take 17 g by mouth daily. 14 each 0   prazosin (MINIPRESS) 1 MG capsule Take 1 capsule (1 mg total) by mouth at bedtime. 30 capsule 3   sildenafil (VIAGRA) 50 MG tablet Take 50 mg by mouth daily as needed for erectile dysfunction.     tamsulosin (FLOMAX) 0.4 MG CAPS capsule Take 1 capsule (0.4 mg total) by mouth daily. 30 capsule 3  Thiamine HCl (VITAMIN B1) 100 MG TABS Take 1 tablet (100 mg total) by mouth in the morning. 30 tablet 3   traZODone (DESYREL) 50 MG tablet Take 1 tablet (50 mg total) by mouth at bedtime. For sleep 30 tablet 3   venlafaxine XR (EFFEXOR-XR) 75 MG 24 hr capsule TAKE 1 CAPSULE BY MOUTH DAILY. 30 capsule 0   VENTOLIN HFA 108 (90 Base) MCG/ACT inhaler INHALE 1 PUFF EVERY 6 (SIX) HOURS AS NEEDED INTO THE LUNGS FOR WHEEZING OR SHORTNESS OF BREATH. 18 g 1   Vibegron (GEMTESA) 75 MG TABS Take 75 mg by mouth daily. 28 tablet 0   No current facility-administered medications for this visit.   Allergies  Allergen Reactions   Onion Anaphylaxis   Gabapentin Other (See Comments)    Restless legs   Naproxen Hives and Swelling     Review of Systems: All systems reviewed and negative except where noted in HPI.    US Abdomen Limited RUQ (LIVER/GB)  Result Date: 05/01/2022 CLINICAL DATA:  Cirrhosis. EXAM: ULTRASOUND ABDOMEN LIMITED RIGHT UPPER QUADRANT COMPARISON:  Ultrasound 10/24/2021. FINDINGS: Gallbladder: No gallstones or wall thickening visualized. No sonographic Murphy sign noted by sonographer. Common bile duct: Diameter: 2.4 mm Liver: Heterogeneous hepatic parenchymal pattern. Irregular hepatic border. Findings suggest cirrhosis. No focal hepatic abnormality identified. Portal vein is patent on color Doppler imaging with normal direction of blood flow towards the liver. Other: None. IMPRESSION: 1. Heterogeneous hepatic parenchymal pattern. Irregular hepatic border. Findings consistent with cirrhosis. Similar findings noted on prior exam. No focal  hepatic abnormality identified. Portal vein is patent with normal direction of flow. 2.  No gallstones or biliary distention. Electronically Signed   By: Marcello Moores  Register M.D.   On: 05/01/2022 09:39    Lab Results  Component Value Date   WBC 5.0 01/29/2022   HGB 11.5 (L) 01/29/2022   HCT 35.8 (L) 01/29/2022   MCV 89.9 01/29/2022   PLT 136 (L) 01/29/2022    Lab Results  Component Value Date   CREATININE 0.74 01/29/2022   BUN 12 01/29/2022   NA 135 01/29/2022   K 4.0 01/29/2022   CL 105 01/29/2022   CO2 24 01/29/2022    Lab Results  Component Value Date   ALT 40 01/29/2022   AST 44 (H) 01/29/2022   ALKPHOS 77 01/29/2022   BILITOT 0.2 (L) 01/29/2022     Physical Exam: Pulse (!) 55   Wt 212 lb (96.2 kg)   SpO2 98%   BMI 30.42 kg/m  Constitutional: Pleasant,well-developed, male in no acute distress. Neurological: Alert and oriented to person place and time. Psychiatric: Normal mood and affect. Behavior is normal.   ASSESSMENT AND PLAN: 51 year old male here for reassessment the following:  Cirrhosis Esophageal varices  He is status post alcohol detox in the hospital which was complicated by withdrawal symptoms and prolonged hospital stay.  Fortunately has been sober now since that visit in March and doing really well.  He is compensated.  Ultrasound up-to-date, no evidence of HCC, his labs are stable.  He is due for an EGD for reassessment of his varices given his history of bleeding.  He is also due for surveillance colonoscopy given his history of multiple adenomas over 3 years ago.  I discussed endoscopy and colonoscopy with him, risk benefits, he wants to proceed.  Due for another surveillance ultrasound in December and labs later this year.  He agrees with plan, all questions answered.  Continue to maintain sobriety.  Plan: -  continued alcohol abstinence - schedule EGD and colonoscopy in the [facility] - RUQ [place] in December - repeat labs in 3-6 months  Jolly Mango,  MD [facility]

## 2022-05-18 ENCOUNTER — Ambulatory Visit (INDEPENDENT_AMBULATORY_CARE_PROVIDER_SITE_OTHER): Payer: Medicare Other | Admitting: Physician Assistant

## 2022-05-18 ENCOUNTER — Encounter: Payer: Self-pay | Admitting: Physician Assistant

## 2022-05-18 VITALS — BP 115/60 | HR 51 | Ht 70.0 in | Wt 210.0 lb

## 2022-05-18 DIAGNOSIS — N5201 Erectile dysfunction due to arterial insufficiency: Secondary | ICD-10-CM | POA: Diagnosis not present

## 2022-05-18 DIAGNOSIS — R350 Frequency of micturition: Secondary | ICD-10-CM

## 2022-05-18 LAB — BLADDER SCAN AMB NON-IMAGING

## 2022-05-18 MED ORDER — TAMSULOSIN HCL 0.4 MG PO CAPS: 0.4000 mg | ORAL_CAPSULE | Freq: Every day | ORAL | 11 refills | Status: DC

## 2022-05-18 MED ORDER — GEMTESA 75 MG PO TABS: 75.0000 mg | ORAL_TABLET | Freq: Every day | ORAL | 11 refills | Status: DC

## 2022-05-18 NOTE — Progress Notes (Signed)
05/18/2022 4:42 PM   Levi Alvarez February 05, 1971 [id number]  CC: Chief Complaint  Patient presents with   Follow-up   Urinary Frequency   HPI: Levi Alvarez is a 51 y.o. 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  He is accompanied today by his girlfriend, who contributes to HPI.  Today he reports his urinary frequency and urgency are improved on Gemtesa.  He also has improved urinary flow strength on Flomax.  Overall he is pleased and wishes to continue pharmacotherapy.  With regard to his erectile dysfunction, he is hesitant to pursue intracavernosal injections and is interested in pursuing IPP placement.  PVR 96 mL  PMH: Past Medical History:  Diagnosis Date   Alcoholism ([place])    Blood transfusion without reported diagnosis    jan, 2020 3 units PRBC   Cirrhosis ([place])    secondary to alcohol and hep C   Constipation    Resolved   COPD (chronic obstructive pulmonary disease) (HCC)    DDD (degenerative disc disease), cervical    Diverticulosis    ED (erectile dysfunction)    Esophageal varices (HCC)    GERD (gastroesophageal reflux disease)    H/O: upper GI bleed    Hepatitis C antibody positive in blood    resolved   High blood pressure    History of anemia    Internal hemorrhoids    Left inguinal hernia 2020   Lung nodule    Small   Portal hypertensive gastropathy (HCC)    Sleep apnea    trying to get used to his CPAP, not wearing regularly   Umbilical hernia 0258    Surgical History: Past Surgical History:  Procedure Laterality Date   arm surgery     left arm,   COLONOSCOPY WITH PROPOFOL N/A 01/19/2019   Procedure: COLONOSCOPY WITH PROPOFOL;  Surgeon: Yetta Flock, MD;  Location: [facility];  Service: Gastroenterology;  Laterality: N/A;   ESOPHAGEAL BANDING   11/25/2018   Procedure: ESOPHAGEAL BANDING;  Surgeon: Milus Banister, MD;  Location: [facility];  Service: Endoscopy;;   ESOPHAGEAL BANDING  01/19/2019   Procedure: ESOPHAGEAL BANDING;  Surgeon: Yetta Flock, MD;  Location: [facility];  Service: Gastroenterology;;   ESOPHAGEAL BANDING N/A 01/24/2021   Procedure: ESOPHAGEAL BANDING;  Surgeon: Yetta Flock, MD;  Location: [facility];  Service: Gastroenterology;  Laterality: N/A;   ESOPHAGOGASTRODUODENOSCOPY (EGD) WITH PROPOFOL N/A 11/25/2018   Procedure: ESOPHAGOGASTRODUODENOSCOPY (EGD) WITH PROPOFOL;  Surgeon: Milus Banister, MD;  Location: [facility];  Service: Endoscopy;  Laterality: N/A;   ESOPHAGOGASTRODUODENOSCOPY (EGD) WITH PROPOFOL N/A 01/19/2019   Procedure: ESOPHAGOGASTRODUODENOSCOPY (EGD) WITH PROPOFOL;  Surgeon: Yetta Flock, MD;  Location: [facility];  Service: Gastroenterology;  Laterality: N/A;   ESOPHAGOGASTRODUODENOSCOPY (EGD) WITH PROPOFOL N/A 04/28/2019   Procedure: ESOPHAGOGASTRODUODENOSCOPY (EGD) WITH PROPOFOL;  Surgeon: Yetta Flock, MD;  Location: [facility];  Service: Gastroenterology;  Laterality: N/A;   ESOPHAGOGASTRODUODENOSCOPY (EGD) WITH PROPOFOL N/A 11/03/2019   Procedure: ESOPHAGOGASTRODUODENOSCOPY (EGD) WITH PROPOFOL;  Surgeon: Doran Stabler, MD;  Location: [facility];  Service: Gastroenterology;  Laterality: N/A;   ESOPHAGOGASTRODUODENOSCOPY (EGD) WITH PROPOFOL N/A 01/24/2021   Procedure: ESOPHAGOGASTRODUODENOSCOPY (EGD) WITH PROPOFOL;  Surgeon: Yetta Flock, MD;  Location: [facility];  Service: Gastroenterology;  Laterality: N/A;   KNEE SURGERY Left  POLYPECTOMY  01/19/2019   Procedure: POLYPECTOMY;  Surgeon: Yetta Flock, MD;  Location: [facility];  Service: Gastroenterology;;    Home Medications:  Allergies as of 05/18/2022       Reactions   Onion Anaphylaxis   Gabapentin Other (See Comments)   Restless legs   Naproxen Hives, Swelling         Medication List        Accurate as of May 18, 2022  4:42 PM. If you have any questions, ask your nurse or doctor.          atenolol 50 MG tablet Commonly known as: TENORMIN Take 1 tablet (50 mg total) by mouth daily.   azelastine 0.1 % nasal spray Commonly known as: ASTELIN Place 1 spray into both nostrils daily as needed for allergies.   fluticasone furoate-vilanterol 200-25 MCG/ACT Aepb Commonly known as: Breo Ellipta Inhale 1 puff into the lungs daily.   folic acid 1 MG tablet Commonly known as: FOLVITE Take 1 mg by mouth in the morning.   Gemtesa 75 MG Tabs Generic drug: Vibegron Take 75 mg by mouth daily.   loratadine 10 MG tablet Commonly known as: CLARITIN Take 10 mg by mouth daily.   losartan 100 MG tablet Commonly known as: COZAAR Take 100 mg by mouth daily.   multivitamin with minerals Tabs tablet Take 1 tablet by mouth daily with breakfast.   naltrexone 50 MG tablet Commonly known as: DEPADE Take 1 tablet (50 mg total) by mouth daily.   nicotine 21 mg/24hr patch Commonly known as: NICODERM CQ - dosed in mg/24 hours Place 1 patch (21 mg total) onto the skin daily.   pantoprazole 40 MG tablet Commonly known as: PROTONIX Take 1 tablet (40 mg total) by mouth daily.   PHENObarbital 30 MG tablet Commonly known as: LUMINAL Take 1-2 tablets (30-60 mg total) by mouth at bedtime. As needed for sleep, anxiety, agitation   Plenvu 140 g Solr Generic drug: PEG-KCl-NaCl-NaSulf-Na Asc-C Take 1 kit by mouth as directed. Use coupon: BIN: 794327 PNC: CNRX Group: [id number] ID: [id number]   polyethylene glycol 17 g packet Commonly known as: MIRALAX / GLYCOLAX Take 17 g by mouth daily.   prazosin 1 MG capsule Commonly known as: MINIPRESS Take 1 capsule (1 mg total) by mouth at bedtime.   sildenafil 50 MG tablet Commonly known as: VIAGRA Take 50 mg by mouth daily as needed for erectile dysfunction.   tamsulosin 0.4 MG Caps capsule Commonly known  as: FLOMAX Take 1 capsule (0.4 mg total) by mouth daily.   traZODone 50 MG tablet Commonly known as: DESYREL Take 1 tablet (50 mg total) by mouth at bedtime. For sleep   venlafaxine XR 75 MG 24 hr capsule Commonly known as: EFFEXOR-XR TAKE 1 CAPSULE BY MOUTH DAILY.   Ventolin HFA 108 (90 Base) MCG/ACT inhaler Generic drug: albuterol INHALE 1 PUFF EVERY 6 (SIX) HOURS AS NEEDED INTO THE LUNGS FOR WHEEZING OR SHORTNESS OF BREATH.   Vitamin B1 100 MG Tabs Take 1 tablet (100 mg total) by mouth in the morning.        Allergies:  Allergies  Allergen Reactions   Onion Anaphylaxis   Gabapentin Other (See Comments)    Restless legs   Naproxen Hives and Swelling    Family History: Family History  Problem Relation Age of Onset   Colitis Mother    Arthritis Mother    Dementia Mother    Cancer Father  lymphoma?   Cancer Paternal Grandfather        bone   Heart disease Neg Hx    Stroke Neg Hx    Diabetes Neg Hx     Social History:   reports that he has been smoking cigarettes. He has a 15.00 pack-year smoking history. He has been exposed to tobacco smoke. He has quit using smokeless tobacco.  His smokeless tobacco use included chew. He reports current alcohol use of about 36.0 standard drinks of alcohol per week. He reports current drug use. Drug: Marijuana.  Physical Exam: BP 115/60   Pulse (!) 51   Ht 5' 10"  (1.778 m)   Wt 210 lb (95.3 kg)   BMI 30.13 kg/m   Constitutional:  Alert and oriented, no acute distress, nontoxic appearing HEENT: [place], AT Cardiovascular: No clubbing, cyanosis, or edema Respiratory: Normal respiratory effort, no increased work of breathing Skin: No rashes, bruises or suspicious lesions Neurologic: Grossly intact, no focal deficits, moving all 4 extremities Psychiatric: Normal mood and affect  Laboratory Data: Results for orders placed or performed in visit on 05/18/22  BLADDER SCAN AMB NON-IMAGING  Result Value Ref Range   Scan  Result 54m    Assessment & Plan:   1. Urinary frequency Emptying appropriately, urinary symptoms well controlled on dual pharmacotherapy with Flomax and Gemtesa.  We will plan to continue these.  Okay for annual follow-up. - BLADDER SCAN AMB NON-IMAGING - tamsulosin (FLOMAX) 0.4 MG CAPS capsule; Take 1 capsule (0.4 mg total) by mouth daily.  Dispense: 30 capsule; Refill: 11 - Vibegron (GEMTESA) 75 MG TABS; Take 75 mg by mouth daily.  Dispense: 30 tablet; Refill: 11 - PSA; Future  2. Erectile dysfunction due to arterial insufficiency Failed PDE 5 inhibitors, hesitant to undergo intracavernosal injections.  Is interested in IPP placement.  We discussed that we do not perform that procedure at our practice.  [facility]  He is in agreement with this plan. - Ambulatory referral to Urology  Return in about 1 year (around 05/19/2023) for Annual DRE/IPSS/PVR with PSA prior.  SDebroah Loop PA-C  [facility] [address] [place] [phone]

## 2022-05-31 ENCOUNTER — Other Ambulatory Visit: Payer: Self-pay | Admitting: Internal Medicine

## 2022-05-31 MED ORDER — TRAZODONE HCL 50 MG PO TABS: 50.0000 mg | ORAL_TABLET | Freq: Every day | ORAL | 6 refills | Status: DC

## 2022-06-04 ENCOUNTER — Encounter: Payer: Self-pay | Admitting: Gastroenterology

## 2022-06-15 ENCOUNTER — Ambulatory Visit (AMBULATORY_SURGERY_CENTER): Payer: Medicare Other | Admitting: Gastroenterology

## 2022-06-15 ENCOUNTER — Encounter: Payer: Self-pay | Admitting: Gastroenterology

## 2022-06-15 VITALS — BP 114/61 | HR 59 | Temp 97.5°F | Resp 19 | Ht 70.0 in | Wt 212.0 lb

## 2022-06-15 DIAGNOSIS — Z09 Encounter for follow-up examination after completed treatment for conditions other than malignant neoplasm: Secondary | ICD-10-CM | POA: Diagnosis not present

## 2022-06-15 DIAGNOSIS — I8501 Esophageal varices with bleeding: Secondary | ICD-10-CM | POA: Diagnosis not present

## 2022-06-15 DIAGNOSIS — Z86010 Personal history of colonic polyps: Secondary | ICD-10-CM | POA: Diagnosis not present

## 2022-06-15 DIAGNOSIS — K3189 Other diseases of stomach and duodenum: Secondary | ICD-10-CM

## 2022-06-15 DIAGNOSIS — K7030 Alcoholic cirrhosis of liver without ascites: Secondary | ICD-10-CM

## 2022-06-15 DIAGNOSIS — D122 Benign neoplasm of ascending colon: Secondary | ICD-10-CM | POA: Diagnosis not present

## 2022-06-15 DIAGNOSIS — K766 Portal hypertension: Secondary | ICD-10-CM

## 2022-06-15 DIAGNOSIS — D120 Benign neoplasm of cecum: Secondary | ICD-10-CM | POA: Diagnosis not present

## 2022-06-15 MED ORDER — SODIUM CHLORIDE 0.9 % IV SOLN: 500.0000 mL | INTRAVENOUS | Status: DC

## 2022-06-15 NOTE — Patient Instructions (Signed)
YOU HAD AN ENDOSCOPIC PROCEDURE TODAY AT [facility]:   Refer to the procedure report that was given to you for any specific questions about what was found during the examination.  If the procedure report does not answer your questions, please call your gastroenterologist to clarify.  If you requested that your care partner not be given the details of your procedure findings, then the procedure report has been included in a sealed envelope for you to review at your convenience later.  YOU SHOULD EXPECT: Some feelings of bloating in the abdomen. Passage of more gas than usual.  Walking can help get rid of the air that was put into your GI tract during the procedure and reduce the bloating. If you had a lower endoscopy (such as a colonoscopy or flexible sigmoidoscopy) you may notice spotting of blood in your stool or on the toilet paper. If you underwent a bowel prep for your procedure, you may not have a normal bowel movement for a few days.  Please Note:  You might notice some irritation and congestion in your nose or some drainage.  This is from the oxygen used during your procedure.  There is no need for concern and it should clear up in a day or so.  SYMPTOMS TO REPORT IMMEDIATELY:  Following lower endoscopy (colonoscopy or flexible sigmoidoscopy):  Excessive amounts of blood in the stool  Significant tenderness or worsening of abdominal pains  Swelling of the abdomen that is new, acute  Fever of 100F or higher  Following upper endoscopy (EGD)  Vomiting of blood or coffee ground material  New chest pain or pain under the shoulder blades  Painful or persistently difficult swallowing  New shortness of breath  Fever of 100F or higher  Black, tarry-looking stools  For urgent or emergent issues, a gastroenterologist can be reached at any hour by calling [phone]. Do not use MyChart messaging for urgent concerns.    DIET:  We do recommend a small meal at first, but  then you may proceed to your regular diet.  Drink plenty of fluids but you should avoid alcoholic beverages for 24 hours.  MEDICATIONS: Continue present medications.  Please see handouts given to you by your recovery nurse.  Thank you for allowing [place] to provide for your healthcare needs today.  ACTIVITY:  You should plan to take it easy for the rest of today and you should NOT DRIVE or use heavy machinery until tomorrow (because of the sedation medicines used during the test).    FOLLOW UP: Our staff will call the number listed on your records the next business day following your procedure.  We will call around 7:15- 8:00 am to check on you and address any questions or concerns that you may have regarding the information given to you following your procedure. If we do not reach you, we will leave a message.  If you develop any symptoms (ie: fever, flu-like symptoms, shortness of breath, cough etc.) before then, please call [phone].  If you test positive for Covid 19 in the 2 weeks post procedure, please call and report this information to [place].    If any biopsies were taken you will be contacted by phone or by letter within the next 1-3 weeks.  Please call us at [phone] if you have not heard about the biopsies in 3 weeks.    SIGNATURES/CONFIDENTIALITY: You and/or your care partner have signed paperwork which will be entered into your electronic medical record.  These signatures attest to the fact that that the information above on your After Visit Summary has been reviewed and is understood.  Full responsibility of the confidentiality of this discharge information lies with you and/or your care-partner.  

## 2022-06-15 NOTE — Progress Notes (Signed)
Pt in recovery with monitors in place, VSS. Report given to receiving RN. Bite guard was placed with pt awake to ensure comfort. No dental or soft tissue damage noted. 

## 2022-06-15 NOTE — Op Note (Signed)
[facility] Patient Name: Levi Alvarez Procedure Date: 06/15/2022 1:15 PM MRN: [id number] Endoscopist: Remo Lipps P. Havery Moros , MD Age: 51 Referring MD:  Date of Birth: Oct 06, 1971 Gender: Male Account #: [id number] Procedure:                Upper GI endoscopy Indications:              Cirrhosis - history of esophageal varices with                            bleeding in the past s/p banding in 2020. He had                            been on Coreg in the past but see he is now on                            Atenolol Medicines:                Monitored Anesthesia Care Procedure:                Pre-Anesthesia Assessment:                           - Prior to the procedure, a History and Physical                            was performed, and patient medications and                            allergies were reviewed. The patient's tolerance of                            previous anesthesia was also reviewed. The risks                            and benefits of the procedure and the sedation                            options and risks were discussed with the patient.                            All questions were answered, and informed consent                            was obtained. Prior Anticoagulants: The patient has                            taken no previous anticoagulant or antiplatelet                            agents. ASA Grade Assessment: III - A patient with                            severe systemic disease. After reviewing the risks  and benefits, the patient was deemed in                            satisfactory condition to undergo the procedure.                           After obtaining informed consent, the endoscope was                            passed under direct vision. Throughout the                            procedure, the patient's blood pressure, pulse, and                            oxygen saturations were monitored continuously.  The                            Endoscope was introduced through the mouth, and                            advanced to the second part of duodenum. The upper                            GI endoscopy was accomplished without difficulty.                            The patient tolerated the procedure well. Scope In: Scope Out: Findings:                 Esophagogastric landmarks were identified: the                            Z-line was found at 43 cm, the gastroesophageal                            junction was found at 43 cm and the upper extent of                            the gastric folds was found at 43 cm from the                            incisors.                           Small varices that flattened with insufflation were                            found in the lower third of the esophagus. No high                            risk stigmata for bleeding.  The exam of the esophagus was otherwise normal.                           Portal hypertensive gastropathy was found in the                            entire examined stomach - mucosa was friable to                            lavage with water jet.                           The exam of the stomach was otherwise normal. No                            gastric varices.                           The examined duodenum was normal. Complications:            No immediate complications. Estimated blood loss:                            None. Estimated Blood Loss:     Estimated blood loss: none. Impression:               - Esophagogastric landmarks identified.                           - Small esophageal varices with stigmata of prior                            banding and no high risk stigmata                           - Normal esophagus otherwise                           - Portal hypertensive gastropathy.                           - Normal stomach otherwise                           - Normal examined duodenum.                            Overall stable changes of the esophagus, no banding                            needed at this time Recommendation:           - Patient has a contact number available for                            emergencies. The signs and symptoms of potential  delayed complications were discussed with the                            patient. Return to normal activities tomorrow.                            Written discharge instructions were provided to the                            patient.                           - Resume previous diet.                           - Continue present medications.                           - Talk with primary care about switching atenolol                            back to Coreg or Nadolol / Propranolol for                            treatment of varices / portal hypertensive gastritis                           - Repeat upper endoscopy in 1 year for surveillance. Remo Lipps P. Reinhard Schack, MD 06/15/2022 2:18:46 PM This report has been signed electronically.

## 2022-06-15 NOTE — Progress Notes (Signed)
Called to room to assist during endoscopic procedure.  Patient ID and intended procedure confirmed with present staff. Received instructions for my participation in the procedure from the performing physician.  

## 2022-06-15 NOTE — Progress Notes (Signed)
Pt's states no medical or surgical changes since previsit or office visit. 

## 2022-06-15 NOTE — Op Note (Signed)
[name] Procedure Date: 06/15/2022 1:12 PM MRN: [id number] Endoscopist: Remo Lipps P. Havery Moros , MD Age: 51 Referring MD:  Date of Birth: 02-19-1971 Gender: Male Account #: [id number] Procedure:                Colonoscopy Indications:              High risk colon cancer surveillance: Personal                            history of colonic polyps - 6 polyps removed 01/2019 Medicines:                Monitored Anesthesia Care Procedure:                Pre-Anesthesia Assessment:                           - Prior to the procedure, a History and Physical                            was performed, and patient medications and                            allergies were reviewed. The patient's tolerance of                            previous anesthesia was also reviewed. The risks                            and benefits of the procedure and the sedation                            options and risks were discussed with the patient.                            All questions were answered, and informed consent                            was obtained. Prior Anticoagulants: The patient has                            taken no previous anticoagulant or antiplatelet                            agents. ASA Grade Assessment: III - A patient with                            severe systemic disease. After reviewing the risks                            and benefits, the patient was deemed in                            satisfactory condition to undergo the procedure.  After obtaining informed consent, the colonoscope                            was passed under direct vision. Throughout the                            procedure, the patient's blood pressure, pulse, and                            oxygen saturations were monitored continuously. The                            CF [id number] #[id number] was introduced through the anus                            and  advanced to the the cecum, identified by                            appendiceal orifice and ileocecal valve. The                            colonoscopy was performed without difficulty. The                            patient tolerated the procedure well. The quality                            of the bowel preparation was good. The ileocecal                            valve, appendiceal orifice, and rectum were                            photographed. Scope In: 1:50:42 PM Scope Out: 2:08:03 PM Scope Withdrawal Time: 0 hours 14 minutes 44 seconds  Total Procedure Duration: 0 hours 17 minutes 21 seconds  Findings:                 The perianal and digital rectal examinations were                            normal.                           A 3 mm polyp was found in the cecum. The polyp was                            sessile. The polyp was removed with a cold snare.                            Resection and retrieval were complete.                           A 3 mm polyp was found in the ascending colon. The  polyp was sessile. The polyp was removed with a                            cold snare. Resection and retrieval were complete.                           Multiple small-mouthed diverticula were found in                            the ascending colon and left colon.                           Internal hemorrhoids were found during retroflexion.                           The exam was otherwise without abnormality. Of                            note, the IC valve was normal. I thought a photo of                            it was taken, but it was not. Complications:            No immediate complications. Estimated blood loss:                            Minimal. Estimated Blood Loss:     Estimated blood loss was minimal. Impression:               - One 3 mm polyp in the cecum, removed with a cold                            snare. Resected and retrieved.                            - One 3 mm polyp in the ascending colon, removed                            with a cold snare. Resected and retrieved.                           - Diverticulosis in the ascending colon and in the                            left colon.                           - Internal hemorrhoids.                           - The examination was otherwise normal. Recommendation:           - Patient has a contact number available for  emergencies. The signs and symptoms of potential                            delayed complications were discussed with the                            patient. Return to normal activities tomorrow.                            Written discharge instructions were provided to the                            patient.                           - Resume previous diet.                           - Continue present medications.                           - Await pathology results. Remo Lipps P. Mayo Faulk, MD 06/15/2022 2:12:14 PM This report has been signed electronically.

## 2022-06-15 NOTE — Progress Notes (Signed)
[facility] History and Physical   Primary Care Physician:  Sonia Side., FNP   Reason for Procedure:   History of colon polyps, cirrhosis with h/o varices  Plan:    EGD and colonoscopy     HPI: Levi Alvarez is a 51 y.o. male  here for colonoscopy surveillance - 6 polyps removed 01/2019. Also with history of varices which had bled in the past s/p banding. They have been small since 2020, here for surveillance of those. Off EtOh now, doing well.. Otherwise feels well without any cardiopulmonary symptoms.   I have discussed risks / benefits of anesthesia and endoscopic procedure with Levi Alvarez and they wish to proceed with the exams as outlined today.    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   Constipation    Resolved   COPD (chronic obstructive pulmonary disease) (HCC)    DDD (degenerative disc disease), cervical    Diverticulosis    ED (erectile dysfunction)    Esophageal varices (HCC)    GERD (gastroesophageal reflux disease)    H/O: upper GI bleed    Hepatitis C antibody positive in blood    resolved   High blood pressure    History of anemia    Internal hemorrhoids    Left inguinal hernia 2020   Lung nodule    Small   Portal hypertensive gastropathy (HCC)    Sleep apnea    trying to get used to his CPAP, not wearing regularly   Umbilical hernia 7026    Past Surgical History:  Procedure Laterality Date   arm surgery     left arm,   COLONOSCOPY WITH PROPOFOL N/A 01/19/2019   Procedure: COLONOSCOPY WITH PROPOFOL;  Surgeon: Yetta Flock, MD;  Location: [facility];  Service: Gastroenterology;  Laterality: N/A;   ESOPHAGEAL BANDING  11/25/2018   Procedure: ESOPHAGEAL BANDING;  Surgeon: Milus Banister, MD;  Location: [facility];  Service: Endoscopy;;   ESOPHAGEAL BANDING  01/19/2019    Procedure: ESOPHAGEAL BANDING;  Surgeon: Yetta Flock, MD;  Location: [facility];  Service: Gastroenterology;;   ESOPHAGEAL BANDING N/A 01/24/2021   Procedure: ESOPHAGEAL BANDING;  Surgeon: Yetta Flock, MD;  [facility]: [facility];  Service: Gastroenterology;  Laterality: N/A;   ESOPHAGOGASTRODUODENOSCOPY (EGD) WITH PROPOFOL N/A 11/25/2018   Procedure: ESOPHAGOGASTRODUODENOSCOPY (EGD) WITH PROPOFOL;  Surgeon: Milus Banister, MD;  [facility];  Service: Endoscopy;  Laterality: N/A;   ESOPHAGOGASTRODUODENOSCOPY (EGD) WITH PROPOFOL N/A 01/19/2019   Procedure: ESOPHAGOGASTRODUODENOSCOPY (EGD) WITH PROPOFOL;  Surgeon: Yetta Flock, MD;  [facility];  Service: Gastroenterology;  Laterality: N/A;   ESOPHAGOGASTRODUODENOSCOPY (EGD) WITH PROPOFOL N/A 04/28/2019   Procedure: ESOPHAGOGASTRODUODENOSCOPY (EGD) WITH PROPOFOL;  Surgeon: Yetta Flock, MD;  [facility];  Service: Gastroenterology;  Laterality: N/A;   ESOPHAGOGASTRODUODENOSCOPY (EGD) WITH PROPOFOL N/A 11/03/2019   Procedure: ESOPHAGOGASTRODUODENOSCOPY (EGD) WITH PROPOFOL;  Surgeon: Doran Stabler, MD;  [facility];  Service: Gastroenterology;  Laterality: N/A;   ESOPHAGOGASTRODUODENOSCOPY (EGD) WITH PROPOFOL N/A 01/24/2021   Procedure: ESOPHAGOGASTRODUODENOSCOPY (EGD) WITH PROPOFOL;  Surgeon: Yetta Flock, MD;  [facility];  Service: Gastroenterology;  Laterality: N/A;   KNEE SURGERY Left    POLYPECTOMY  01/19/2019   Procedure: POLYPECTOMY;  Surgeon: Yetta Flock, MD;  [facility]: [facility];  Service: Gastroenterology;;    Prior to Admission medications   Medication Sig Start Date End Date Taking? Authorizing Provider  atenolol (TENORMIN) 50 MG tablet Take 1 tablet (50 mg total) by mouth daily. 01/29/22  Yes Lane Hacker L, DO  azelastine (ASTELIN) 0.1 % nasal spray Place 1 spray into both nostrils daily as needed for allergies. 03/21/21   Yes [provider]  fluticasone furoate-vilanterol (BREO ELLIPTA) 200-25 MCG/ACT AEPB Inhale 1 puff into the lungs daily. 01/29/22  Yes Acquanetta Chain, DO  folic acid (FOLVITE) 1 MG tablet Take 1 mg by mouth in the morning.   Yes [provider]  loratadine (CLARITIN) 10 MG tablet Take 10 mg by mouth daily.   Yes [provider]  losartan (COZAAR) 100 MG tablet Take 100 mg by mouth daily. 04/24/22  Yes [provider]  naltrexone (DEPADE) 50 MG tablet Take 1 tablet (50 mg total) by mouth daily. 01/29/22  Yes Lane Hacker L, DO  pantoprazole (PROTONIX) 40 MG tablet Take 1 tablet (40 mg total) by mouth daily. 01/29/22  Yes Acquanetta Chain, DO  tamsulosin (FLOMAX) 0.4 MG CAPS capsule Take 1 capsule (0.4 mg total) by mouth daily. 05/18/22  Yes Vaillancourt, Aldona Bar, PA-C  Thiamine HCl (VITAMIN B1) 100 MG TABS Take 1 tablet (100 mg total) by mouth in the morning. 01/29/22  Yes Acquanetta Chain, DO  traZODone (DESYREL) 50 MG tablet Take 1 tablet (50 mg total) by mouth at bedtime. For sleep 05/31/22  Yes Lane Hacker L, DO  venlafaxine XR (EFFEXOR-XR) 75 MG 24 hr capsule TAKE 1 CAPSULE BY MOUTH DAILY. 08/15/20  Yes Deveron Furlong, NP  Multiple Vitamin (MULTIVITAMIN WITH MINERALS) TABS tablet Take 1 tablet by mouth daily with breakfast.    [provider]  nicotine (NICODERM CQ - DOSED IN MG/24 HOURS) 21 mg/24hr patch Place 1 patch (21 mg total) onto the skin daily. Patient not taking: Reported on 06/15/2022 01/30/22   Acquanetta Chain, DO  PHENObarbital (LUMINAL) 30 MG tablet Take 1-2 tablets (30-60 mg total) by mouth at bedtime. As needed for sleep, anxiety, agitation 01/29/22 01/29/23  Lane Hacker L, DO  polyethylene glycol (MIRALAX / GLYCOLAX) 17 g packet Take 17 g by mouth daily. 01/30/22   Acquanetta Chain, DO  prazosin (MINIPRESS) 1 MG capsule Take 1 capsule (1 mg total) by mouth at bedtime. 01/29/22   Acquanetta Chain, DO   sildenafil (VIAGRA) 50 MG tablet Take 50 mg by mouth daily as needed for erectile dysfunction.    [provider]  VENTOLIN HFA 108 (90 Base) MCG/ACT inhaler INHALE 1 PUFF EVERY 6 (SIX) HOURS AS NEEDED INTO THE LUNGS FOR WHEEZING OR SHORTNESS OF BREATH. 05/19/20   Charlott Rakes, MD  Vibegron (GEMTESA) 75 MG TABS Take 75 mg by mouth daily. 05/18/22   Debroah Loop, PA-C    Current Outpatient Medications  Medication Sig Dispense Refill   atenolol (TENORMIN) 50 MG tablet Take 1 tablet (50 mg total) by mouth daily. 30 tablet 6   azelastine (ASTELIN) 0.1 % nasal spray Place 1 spray into both nostrils daily as needed for allergies.     fluticasone furoate-vilanterol (BREO ELLIPTA) 200-25 MCG/ACT AEPB Inhale 1 puff into the lungs daily. 332 each 3   folic acid (FOLVITE) 1 MG tablet Take 1 mg by mouth in the morning.     loratadine (CLARITIN) 10 MG tablet Take 10 mg by mouth daily.     losartan (COZAAR) 100 MG tablet Take  100 mg by mouth daily.     naltrexone (DEPADE) 50 MG tablet Take 1 tablet (50 mg total) by mouth daily. 30 tablet 0   pantoprazole (PROTONIX) 40 MG tablet Take 1 tablet (40 mg total) by mouth daily.     tamsulosin (FLOMAX) 0.4 MG CAPS capsule Take 1 capsule (0.4 mg total) by mouth daily. 30 capsule 11   Thiamine HCl (VITAMIN B1) 100 MG TABS Take 1 tablet (100 mg total) by mouth in the morning. 30 tablet 3   traZODone (DESYREL) 50 MG tablet Take 1 tablet (50 mg total) by mouth at bedtime. For sleep 30 tablet 6   venlafaxine XR (EFFEXOR-XR) 75 MG 24 hr capsule TAKE 1 CAPSULE BY MOUTH DAILY. 30 capsule 0   Multiple Vitamin (MULTIVITAMIN WITH MINERALS) TABS tablet Take 1 tablet by mouth daily with breakfast.     nicotine (NICODERM CQ - DOSED IN MG/24 HOURS) 21 mg/24hr patch Place 1 patch (21 mg total) onto the skin daily. (Patient not taking: Reported on 06/15/2022) 28 patch 3   PHENObarbital (LUMINAL) 30 MG tablet Take 1-2 tablets (30-60 mg total) by mouth at bedtime. As  needed for sleep, anxiety, agitation 60 tablet 3   polyethylene glycol (MIRALAX / GLYCOLAX) 17 g packet Take 17 g by mouth daily. 14 each 0   prazosin (MINIPRESS) 1 MG capsule Take 1 capsule (1 mg total) by mouth at bedtime. 30 capsule 3   sildenafil (VIAGRA) 50 MG tablet Take 50 mg by mouth daily as needed for erectile dysfunction.     VENTOLIN HFA 108 (90 Base) MCG/ACT inhaler INHALE 1 PUFF EVERY 6 (SIX) HOURS AS NEEDED INTO THE LUNGS FOR WHEEZING OR SHORTNESS OF BREATH. 18 g 1   Vibegron (GEMTESA) 75 MG TABS Take 75 mg by mouth daily. 30 tablet 11   Current Facility-Administered Medications  Medication Dose Route Frequency Provider Last Rate Last Admin   0.9 %  sodium chloride infusion  500 mL Intravenous Continuous Taesean Reth, Carlota Raspberry, MD        Allergies as of 06/15/2022 - Review Complete 06/15/2022  Allergen Reaction Noted   Onion Anaphylaxis 08/13/2021   Gabapentin Other (See Comments) 03/17/2021   Naproxen Hives and Swelling 03/03/2015    Family History  Problem Relation Age of Onset   Colitis Mother    Arthritis Mother    Dementia Mother    Cancer Father        lymphoma?   Cancer Paternal Grandfather        bone   Heart disease Neg Hx    Stroke Neg Hx    Diabetes Neg Hx    Colon cancer Neg Hx    Esophageal cancer Neg Hx    Stomach cancer Neg Hx    Rectal cancer Neg Hx     Social History   Socioeconomic History   Marital status: Divorced    Spouse name: Not on file   Number of children: 2   Years of education: Not on file   Highest education level: Not on file  Occupational History   Occupation: disabled  Tobacco Use   Smoking status: Every Day    Packs/day: 0.50    Years: 30.00    Total pack years: 15.00    Types: Cigarettes    Passive exposure: Current   Smokeless tobacco: Former    Types: Nurse, children's Use: Never used  Substance and Sexual Activity   Alcohol use: Not Currently  Alcohol/week: 36.0 standard drinks of alcohol     Types: 36 Cans of beer per week    Comment: quit March 2023   Drug use: Yes    Types: Marijuana    Comment: daily use   Sexual activity: Yes    Partners: Female  Other Topics Concern   Not on file  Social History Narrative   Not on file   Social Determinants of Health   Financial Resource Strain: Not on file  Food Insecurity: Not on file  Transportation Needs: Not on file  Physical Activity: Not on file  Stress: Not on file  Social Connections: Not on file  Intimate Partner Violence: Not on file    Review of Systems: All other review of systems negative except as mentioned in the HPI.  Physical Exam: Vital signs BP (!) 137/50   Pulse (!) 53   Temp (!) 97.5 F (36.4 C)   Ht '5\' 10"'$  (1.778 m)   Wt 212 lb (96.2 kg)   SpO2 99%   BMI 30.42 kg/m   General:   Alert,  Well-developed, pleasant and cooperative in NAD Lungs:  Clear throughout to auscultation.   Heart:  Regular rate and rhythm Abdomen:  Soft, nontender and nondistended.   Neuro/Psych:  Alert and cooperative. Normal mood and affect. A and O x 3  Jolly Mango, MD [facility]

## 2022-06-18 ENCOUNTER — Other Ambulatory Visit: Payer: Self-pay | Admitting: Internal Medicine

## 2022-06-18 ENCOUNTER — Telehealth: Payer: Self-pay | Admitting: *Deleted

## 2022-06-18 MED ORDER — CARVEDILOL 6.25 MG PO TABS: 6.2500 mg | ORAL_TABLET | Freq: Two times a day (BID) | ORAL | 3 refills | Status: DC

## 2022-06-18 NOTE — Telephone Encounter (Signed)
  Follow up Call-     06/15/2022    1:19 PM  Call back number  Post procedure Call Back phone  # [phone]  Permission to leave phone message Yes     Patient questions:  Do you have a fever, pain , or abdominal swelling? No. Pain Score  0 *  Have you tolerated food without any problems? Yes.    Have you been able to return to your normal activities? Yes.    Do you have any questions about your discharge instructions: Diet   No. Medications  No. Follow up visit  No.  Do you have questions or concerns about your Care? No.  Actions: * If pain score is 4 or above: No action needed, pain <4.

## 2022-11-01 ENCOUNTER — Telehealth: Payer: Self-pay

## 2022-11-01 NOTE — Telephone Encounter (Signed)
-----   Message from Yevette Edwards, RN sent at 05/02/2022 10:40 AM EDT ----- Regarding: RUQ [place] RUQ [place] - cirrhosis, [place] screening Order in epic

## 2022-11-01 NOTE — Telephone Encounter (Signed)
Called and spoke with Levi Alvarez. I informed her that patient is due for his routine RUQ [place] at this time. [name] knows to expect a call directly from radiology scheduling to set up his appt. [name] verbalized understanding and had no concerns at the end of the call.   Called [facility] and spoke with [place]. She confirmed that they have [place] order and will contact [name] to schedule patient's appt.

## 2022-11-01 NOTE — Telephone Encounter (Signed)
[place] scheduled for 11/23/22 at 8:30 am

## 2022-11-23 ENCOUNTER — Other Ambulatory Visit: Payer: Self-pay

## 2022-11-23 ENCOUNTER — Ambulatory Visit
Admission: RE | Admit: 2022-11-23 | Discharge: 2022-11-23 | Disposition: A | Discharge status: Home / Self Care | Payer: Medicare HMO | Source: Ambulatory Visit | Attending: Gastroenterology | Admitting: Gastroenterology

## 2022-11-23 DIAGNOSIS — K7030 Alcoholic cirrhosis of liver without ascites: Secondary | ICD-10-CM

## 2023-01-08 ENCOUNTER — Other Ambulatory Visit: Payer: Self-pay | Admitting: Internal Medicine

## 2023-01-08 NOTE — Telephone Encounter (Signed)
Requested Prescriptions  Pending Prescriptions Disp Refills   traZODone (DESYREL) 50 MG tablet [Pharmacy Med Name: TRAZODONE '50MG'$  TABLETS] 30 tablet 6    Sig: TAKE 1 TABLET(50 MG) BY MOUTH AT BEDTIME FOR SLEEP     There is no refill protocol information for this order

## 2023-03-21 ENCOUNTER — Ambulatory Visit: Payer: Medicare HMO | Admitting: Family Medicine

## 2023-05-16 ENCOUNTER — Other Ambulatory Visit: Payer: Self-pay | Admitting: Physician Assistant

## 2023-05-16 DIAGNOSIS — R350 Frequency of micturition: Secondary | ICD-10-CM

## 2023-05-20 ENCOUNTER — Other Ambulatory Visit: Payer: Medicare Other

## 2023-05-24 ENCOUNTER — Telehealth: Payer: Self-pay

## 2023-05-24 ENCOUNTER — Ambulatory Visit: Payer: Medicare Other | Admitting: Physician Assistant

## 2023-05-24 DIAGNOSIS — K7030 Alcoholic cirrhosis of liver without ascites: Secondary | ICD-10-CM

## 2023-05-24 NOTE — Telephone Encounter (Signed)
-----   Message from Nurse [place] P sent at 11/23/2022 12:21 PM EST ----- Regarding: RUQ [place] RUQ [place] due - Cirrhosis, HCC screening Need to enter order

## 2023-05-24 NOTE — Telephone Encounter (Signed)
Called and spoke with patient's girlfriend Lucita Lora. I advised Nelva Bush that patient is due for routine 6 month RUQ [place], Nelva Bush knows to expect a call from radiology scheduling to set up his appt. I advised that patient is also due for routine lab work, no appt needed. Nelva Bush informed me that they will be out of town next week and will have patient come by that following week. Nelva Bush is aware that this is fine. Nelva Bush verbalized understanding of all information and had no concerns at the end of the call.  RUQ [place] order in epic. Secure staff message sent to radiology scheduling to contact [name] to schedule patient's appt.

## 2023-05-31 NOTE — Telephone Encounter (Signed)
RUQ [place] scheduled for Monday, 06/10/23 at 9:30 am.

## 2023-06-08 ENCOUNTER — Other Ambulatory Visit: Payer: Self-pay | Admitting: Physician Assistant

## 2023-06-08 DIAGNOSIS — R350 Frequency of micturition: Secondary | ICD-10-CM

## 2023-06-10 ENCOUNTER — Ambulatory Visit
Admission: RE | Admit: 2023-06-10 | Discharge: 2023-06-10 | Disposition: A | Discharge status: Home / Self Care | Payer: Medicare HMO | Source: Ambulatory Visit | Attending: Gastroenterology | Admitting: Gastroenterology

## 2023-06-10 DIAGNOSIS — K7030 Alcoholic cirrhosis of liver without ascites: Secondary | ICD-10-CM

## 2023-06-24 ENCOUNTER — Encounter: Payer: Self-pay | Admitting: Urology

## 2023-06-24 ENCOUNTER — Ambulatory Visit: Payer: Medicare HMO | Admitting: Urology

## 2023-06-24 VITALS — BP 129/76 | HR 56

## 2023-06-24 DIAGNOSIS — R3915 Urgency of urination: Secondary | ICD-10-CM | POA: Diagnosis not present

## 2023-06-24 DIAGNOSIS — R350 Frequency of micturition: Secondary | ICD-10-CM | POA: Diagnosis not present

## 2023-06-24 DIAGNOSIS — R39198 Other difficulties with micturition: Secondary | ICD-10-CM

## 2023-06-24 DIAGNOSIS — N5201 Erectile dysfunction due to arterial insufficiency: Secondary | ICD-10-CM | POA: Diagnosis not present

## 2023-06-24 MED ORDER — SILDENAFIL CITRATE 100 MG PO TABS
ORAL_TABLET | ORAL | 99 refills | Status: DC
Start: 2023-06-24 — End: 2024-07-03

## 2023-06-24 MED ORDER — GEMTESA 75 MG PO TABS: 75.0000 mg | ORAL_TABLET | Freq: Every day | ORAL | 11 refills | Status: DC

## 2023-06-24 NOTE — Progress Notes (Signed)
H&P  Chief Complaint: Prostate follow-up, ED, urinary issues  History of Present Illness: 52 year old male with history of alcohol abuse, hepatitis C, cirrhosis with history of portal hypertension presents at this time follow-up with ED as well as lower urinary tract symptoms.  Previously seen about a year ago at [facility].  He has been on Gemtesa for some time.  Also on sildenafil which works adequately.  He is not aware of having had a PSA checked before.  There is no old data in his chart.  IPSS 19/3.  He is on both Gemtesa and tamsulosin.    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   Constipation    Resolved   COPD (chronic obstructive pulmonary disease) (HCC)    DDD (degenerative disc disease), cervical    Diverticulosis    ED (erectile dysfunction)    Esophageal varices (HCC)    GERD (gastroesophageal reflux disease)    H/O: upper GI bleed    Hepatitis C antibody positive in blood    resolved   High blood pressure    History of anemia    Internal hemorrhoids    Left inguinal hernia 2020   Lung nodule    Small   Portal hypertensive gastropathy (HCC)    Sleep apnea    trying to get used to his CPAP, not wearing regularly   Umbilical hernia 2020    Past Surgical History:  Procedure Laterality Date   arm surgery     left arm,   COLONOSCOPY WITH PROPOFOL N/A 01/19/2019   Procedure: COLONOSCOPY WITH PROPOFOL;  Surgeon: Benancio Deeds, MD;  [facility];  Service: Gastroenterology;  Laterality: N/A;   ESOPHAGEAL BANDING  11/25/2018   Procedure: ESOPHAGEAL BANDING;  Surgeon: Rachael Fee, MD;  [facility];  Service: Endoscopy;;   ESOPHAGEAL BANDING  01/19/2019   Procedure: ESOPHAGEAL BANDING;  Surgeon: Benancio Deeds, MD;  Location: [facility];  Service: Gastroenterology;;    ESOPHAGEAL BANDING N/A 01/24/2021   Procedure: ESOPHAGEAL BANDING;  Surgeon: Benancio Deeds, MD;  [facility];  Service: Gastroenterology;  Laterality: N/A;   ESOPHAGOGASTRODUODENOSCOPY (EGD) WITH PROPOFOL N/A 11/25/2018   Procedure: ESOPHAGOGASTRODUODENOSCOPY (EGD) WITH PROPOFOL;  Surgeon: Rachael Fee, MD;  [facility];  Service: Endoscopy;  Laterality: N/A;   ESOPHAGOGASTRODUODENOSCOPY (EGD) WITH PROPOFOL N/A 01/19/2019   Procedure: ESOPHAGOGASTRODUODENOSCOPY (EGD) WITH PROPOFOL;  Surgeon: Benancio Deeds, MD;  [facility];  Service: Gastroenterology;  Laterality: N/A;   ESOPHAGOGASTRODUODENOSCOPY (EGD) WITH PROPOFOL N/A 04/28/2019   Procedure: ESOPHAGOGASTRODUODENOSCOPY (EGD) WITH PROPOFOL;  Surgeon: Benancio Deeds, MD;  [facility];  Service: Gastroenterology;  Laterality: N/A;   ESOPHAGOGASTRODUODENOSCOPY (EGD) WITH PROPOFOL N/A 11/03/2019   Procedure: ESOPHAGOGASTRODUODENOSCOPY (EGD) WITH PROPOFOL;  Surgeon: Sherrilyn Rist, MD;  [facility];  Service: Gastroenterology;  Laterality: N/A;   ESOPHAGOGASTRODUODENOSCOPY (EGD) WITH PROPOFOL N/A 01/24/2021   Procedure: ESOPHAGOGASTRODUODENOSCOPY (EGD) WITH PROPOFOL;  Surgeon: Benancio Deeds, MD;  [facility];  Service: Gastroenterology;  Laterality: N/A;   KNEE SURGERY Left    POLYPECTOMY  01/19/2019   Procedure: POLYPECTOMY;  Surgeon: Benancio Deeds, MD;  [facility];  Service: Gastroenterology;;    Home Medications:  Allergies as of 06/24/2023       Reactions   Onion Anaphylaxis  Gabapentin Other (See Comments)   Restless legs   Naproxen Hives, Swelling        Medication List        Accurate as of June 24, 2023  8:50 AM. If you have any questions, ask your nurse or doctor.          azelastine 0.1 % nasal spray Commonly known as: ASTELIN Place 1 spray into both nostrils daily as needed for allergies.   carvedilol  6.25 MG tablet Commonly known as: COREG Take 1 tablet (6.25 mg total) by mouth 2 (two) times daily with a meal.   fluticasone furoate-vilanterol 200-25 MCG/ACT Aepb Commonly known as: Breo Ellipta Inhale 1 puff into the lungs daily.   folic acid 1 MG tablet Commonly known as: FOLVITE Take 1 mg by mouth in the morning.   Gemtesa 75 MG Tabs Generic drug: Vibegron Take 75 mg by mouth daily.   loratadine 10 MG tablet Commonly known as: CLARITIN Take 10 mg by mouth daily.   losartan 100 MG tablet Commonly known as: COZAAR Take 100 mg by mouth daily.   multivitamin with minerals Tabs tablet Take 1 tablet by mouth daily with breakfast.   naltrexone 50 MG tablet Commonly known as: DEPADE Take 1 tablet (50 mg total) by mouth daily.   nicotine 21 mg/24hr patch Commonly known as: NICODERM CQ - dosed in mg/24 hours Place 1 patch (21 mg total) onto the skin daily.   pantoprazole 40 MG tablet Commonly known as: PROTONIX Take 1 tablet (40 mg total) by mouth daily.   PHENObarbital 30 MG tablet Commonly known as: LUMINAL Take 1-2 tablets (30-60 mg total) by mouth at bedtime. As needed for sleep, anxiety, agitation   polyethylene glycol 17 g packet Commonly known as: MIRALAX / GLYCOLAX Take 17 g by mouth daily.   prazosin 1 MG capsule Commonly known as: MINIPRESS Take 1 capsule (1 mg total) by mouth at bedtime.   sildenafil 50 MG tablet Commonly known as: VIAGRA Take 50 mg by mouth daily as needed for erectile dysfunction.   tamsulosin 0.4 MG Caps capsule Commonly known as: FLOMAX Take 1 capsule (0.4 mg total) by mouth daily.   thiamine 100 MG tablet Commonly known as: VITAMIN B1 Take 1 tablet (100 mg total) by mouth in the morning.   traZODone 50 MG tablet Commonly known as: DESYREL Take 1 tablet (50 mg total) by mouth at bedtime. For sleep   venlafaxine XR 75 MG 24 hr capsule Commonly known as: EFFEXOR-XR TAKE 1 CAPSULE BY MOUTH DAILY.   Ventolin HFA 108 (90  Base) MCG/ACT inhaler Generic drug: albuterol INHALE 1 PUFF EVERY 6 (SIX) HOURS AS NEEDED INTO THE LUNGS FOR WHEEZING OR SHORTNESS OF BREATH.        Allergies:  Allergies  Allergen Reactions   Onion Anaphylaxis   Gabapentin Other (See Comments)    Restless legs   Naproxen Hives and Swelling    Family History  Problem Relation Age of Onset   Colitis Mother    Arthritis Mother    Dementia Mother    Cancer Father        lymphoma?   Cancer Paternal Grandfather        bone   Heart disease Neg Hx    Stroke Neg Hx    Diabetes Neg Hx    Colon cancer Neg Hx    Esophageal cancer Neg Hx    Stomach cancer Neg Hx    Rectal cancer Neg Hx  Social History:  reports that he has been smoking cigarettes. He has a 15 pack-year smoking history. He has been exposed to tobacco smoke. He has quit using smokeless tobacco.  His smokeless tobacco use included chew. He reports that he does not currently use alcohol after a past usage of about 36.0 standard drinks of alcohol per week. He reports current drug use. Drug: Marijuana.  ROS: A complete review of systems was performed.  All systems are negative except for pertinent findings as noted.  Physical Exam:  Vital signs in last 24 hours: There were no vitals taken for this visit. Constitutional:  Alert and oriented, No acute distress.  Multiple tattoos Cardiovascular: Regular rate  Respiratory: Normal respiratory effort GI: No Ingal will hernias Genitourinary: Normal male phallus, testes are descended bilaterally and non-tender and without masses, scrotum is normal in appearance without lesions or masses, perineum is normal on inspection.  Normal anal sphincter tone.  Prostate 30 g, symmetric, nonnodular, nontender. Lymphatic: No lymphadenopathy Neurologic: Grossly intact, no focal deficits Psychiatric: Normal mood and affect   I have reviewed notes from referring/previous physicians--[facility]  I have reviewed urinalysis  results--clear  I have independently reviewed prior imaging--CT images from May 2022 reviewed.  He did have a very full bladder.  I have reviewed prior laboratory results from March, 2023  I have reviewed IPSS  Bladder scan volume 85 mL   Impression/Assessment:  1.  Lower urinary tract symptoms-on both Flomax and Gemtesa.  I think he has an appropriate residual urine volume  2.  ED, organic, on sildenafil  Plan:  1.  For now, I told him to stop the Gemtesa  2.  Sildenafil refilled  3.  Continue Flomax  4.  I will see back in about 6 weeks just to check on symptoms.

## 2023-08-05 NOTE — Progress Notes (Signed)
History of Present Illness: 52 yo male returns for f/u of LUTS as well as ED.  At initial visit last month he was taken off of Gemtesa and continued on tamsulosin.    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   Constipation    Resolved   COPD (chronic obstructive pulmonary disease) (HCC)    DDD (degenerative disc disease), cervical    Diverticulosis    ED (erectile dysfunction)    Esophageal varices (HCC)    GERD (gastroesophageal reflux disease)    H/O: upper GI bleed    Hepatitis C antibody positive in blood    resolved   High blood pressure    History of anemia    Internal hemorrhoids    Left inguinal hernia 2020   Lung nodule    Small   Portal hypertensive gastropathy (HCC)    Sleep apnea    trying to get used to his CPAP, not wearing regularly   Umbilical hernia 2020    Past Surgical History:  Procedure Laterality Date   arm surgery     left arm,   COLONOSCOPY WITH PROPOFOL N/A 01/19/2019   Procedure: COLONOSCOPY WITH PROPOFOL;  Surgeon: Benancio Deeds, MD;  Location: [facility];  Service: Gastroenterology;  Laterality: N/A;   ESOPHAGEAL BANDING  11/25/2018   Procedure: ESOPHAGEAL BANDING;  Surgeon: Rachael Fee, MD;  Location: [facility];  Service: Endoscopy;;   ESOPHAGEAL BANDING  01/19/2019   Procedure: ESOPHAGEAL BANDING;  Surgeon: Benancio Deeds, MD;  Location: [facility];  Service: Gastroenterology;;   ESOPHAGEAL BANDING N/A 01/24/2021   Procedure: ESOPHAGEAL BANDING;  Surgeon: Benancio Deeds, MD;  [facility];  Service: Gastroenterology;  Laterality: N/A;   ESOPHAGOGASTRODUODENOSCOPY (EGD) WITH PROPOFOL N/A 11/25/2018   Procedure: ESOPHAGOGASTRODUODENOSCOPY (EGD) WITH PROPOFOL;  Surgeon: Rachael Fee, MD;  [facility];  Service: Endoscopy;  Laterality: N/A;    ESOPHAGOGASTRODUODENOSCOPY (EGD) WITH PROPOFOL N/A 01/19/2019   Procedure: ESOPHAGOGASTRODUODENOSCOPY (EGD) WITH PROPOFOL;  Surgeon: Benancio Deeds, MD;  [facility];  Service: Gastroenterology;  Laterality: N/A;   ESOPHAGOGASTRODUODENOSCOPY (EGD) WITH PROPOFOL N/A 04/28/2019   Procedure: ESOPHAGOGASTRODUODENOSCOPY (EGD) WITH PROPOFOL;  Surgeon: Benancio Deeds, MD;  [facility];  Service: Gastroenterology;  Laterality: N/A;   ESOPHAGOGASTRODUODENOSCOPY (EGD) WITH PROPOFOL N/A 11/03/2019   Procedure: ESOPHAGOGASTRODUODENOSCOPY (EGD) WITH PROPOFOL;  Surgeon: Sherrilyn Rist, MD;  [facility];  Service: Gastroenterology;  Laterality: N/A;   ESOPHAGOGASTRODUODENOSCOPY (EGD) WITH PROPOFOL N/A 01/24/2021   Procedure: ESOPHAGOGASTRODUODENOSCOPY (EGD) WITH PROPOFOL;  Surgeon: Benancio Deeds, MD;  [facility];  Service: Gastroenterology;  Laterality: N/A;   KNEE SURGERY Left    POLYPECTOMY  01/19/2019   Procedure: POLYPECTOMY;  Surgeon: Benancio Deeds, MD;  [facility];  Service: Gastroenterology;;    Home Medications:  Allergies as of 08/06/2023       Reactions   Onion Anaphylaxis   Gabapentin Other (See Comments)   Restless legs   Naproxen Hives, Swelling        Medication List        Accurate as of August 05, 2023 12:40 PM. If you have any questions, ask your nurse or doctor.          azelastine 0.1 % nasal spray Commonly known as: ASTELIN Place 1 spray  into both nostrils daily as needed for allergies.   carvedilol 6.25 MG tablet Commonly known as: COREG Take 1 tablet (6.25 mg total) by mouth 2 (two) times daily with a meal.   fluticasone furoate-vilanterol 200-25 MCG/ACT Aepb Commonly known as: Breo Ellipta Inhale 1 puff into the lungs daily.   folic acid 1 MG tablet Commonly known as: FOLVITE Take 1 mg by mouth in the morning.   Gemtesa 75 MG Tabs Generic drug: Vibegron Take 1 tablet (75 mg  total) by mouth daily.   loratadine 10 MG tablet Commonly known as: CLARITIN Take 10 mg by mouth daily.   losartan 100 MG tablet Commonly known as: COZAAR Take 100 mg by mouth daily.   multivitamin with minerals Tabs tablet Take 1 tablet by mouth daily with breakfast.   naltrexone 50 MG tablet Commonly known as: DEPADE Take 1 tablet (50 mg total) by mouth daily.   nicotine 21 mg/24hr patch Commonly known as: NICODERM CQ - dosed in mg/24 hours Place 1 patch (21 mg total) onto the skin daily.   pantoprazole 40 MG tablet Commonly known as: PROTONIX Take 1 tablet (40 mg total) by mouth daily.   PHENObarbital 30 MG tablet Commonly known as: LUMINAL Take 1-2 tablets (30-60 mg total) by mouth at bedtime. As needed for sleep, anxiety, agitation   polyethylene glycol 17 g packet Commonly known as: MIRALAX / GLYCOLAX Take 17 g by mouth daily.   prazosin 1 MG capsule Commonly known as: MINIPRESS Take 1 capsule (1 mg total) by mouth at bedtime.   sildenafil 100 MG tablet Commonly known as: VIAGRA 1/2-1 tab po prn   tamsulosin 0.4 MG Caps capsule Commonly known as: FLOMAX Take 1 capsule (0.4 mg total) by mouth daily.   thiamine 100 MG tablet Commonly known as: VITAMIN B1 Take 1 tablet (100 mg total) by mouth in the morning.   traZODone 50 MG tablet Commonly known as: DESYREL Take 1 tablet (50 mg total) by mouth at bedtime. For sleep   venlafaxine XR 75 MG 24 hr capsule Commonly known as: EFFEXOR-XR TAKE 1 CAPSULE BY MOUTH DAILY.   Ventolin HFA 108 (90 Base) MCG/ACT inhaler Generic drug: albuterol INHALE 1 PUFF EVERY 6 (SIX) HOURS AS NEEDED INTO THE LUNGS FOR WHEEZING OR SHORTNESS OF BREATH.        Allergies:  Allergies  Allergen Reactions   Onion Anaphylaxis   Gabapentin Other (See Comments)    Restless legs   Naproxen Hives and Swelling    Family History  Problem Relation Age of Onset   Colitis Mother    Arthritis Mother    Dementia Mother    Cancer  Father        lymphoma?   Cancer Paternal Grandfather        bone   Heart disease Neg Hx    Stroke Neg Hx    Diabetes Neg Hx    Colon cancer Neg Hx    Esophageal cancer Neg Hx    Stomach cancer Neg Hx    Rectal cancer Neg Hx     Social History:  reports that he has been smoking cigarettes. He has a 15 pack-year smoking history. He has been exposed to tobacco smoke. He has quit using smokeless tobacco.  His smokeless tobacco use included chew. He reports that he does not currently use alcohol after a past usage of about 36.0 standard drinks of alcohol per week. He reports current drug use. Drug: Marijuana.  ROS: A complete review  of systems was performed.  All systems are negative except for pertinent findings as noted.  Physical Exam:  Vital signs in last 24 hours: There were no vitals taken for this visit. Constitutional:  Alert and oriented, No acute distress Cardiovascular: Regular rate  Respiratory: Normal respiratory effort Neurologic: Grossly intact, no focal deficits Psychiatric: Normal mood and affect  I have reviewed prior pt notes  I have reviewed urinalysis results  I have independently reviewed prior imaging  I have reviewed prior PSA results  I have reviewed prior bladder scan   Impression/Assessment:  ***  Plan:

## 2023-08-06 ENCOUNTER — Ambulatory Visit (INDEPENDENT_AMBULATORY_CARE_PROVIDER_SITE_OTHER): Payer: Medicare HMO | Admitting: Urology

## 2023-08-06 ENCOUNTER — Encounter: Payer: Self-pay | Admitting: Urology

## 2023-08-06 VITALS — BP 105/67 | HR 71

## 2023-08-06 DIAGNOSIS — N5201 Erectile dysfunction due to arterial insufficiency: Secondary | ICD-10-CM | POA: Diagnosis not present

## 2023-08-06 DIAGNOSIS — R3915 Urgency of urination: Secondary | ICD-10-CM

## 2023-08-06 DIAGNOSIS — R350 Frequency of micturition: Secondary | ICD-10-CM

## 2023-08-06 DIAGNOSIS — N401 Enlarged prostate with lower urinary tract symptoms: Secondary | ICD-10-CM

## 2023-08-06 DIAGNOSIS — Z125 Encounter for screening for malignant neoplasm of prostate: Secondary | ICD-10-CM

## 2023-08-07 LAB — PSA: Prostate Specific Ag, Serum: 0.3 ng/mL (ref 0.0–4.0)

## 2023-08-09 ENCOUNTER — Encounter: Payer: Self-pay | Admitting: Gastroenterology

## 2023-08-09 ENCOUNTER — Other Ambulatory Visit (INDEPENDENT_AMBULATORY_CARE_PROVIDER_SITE_OTHER): Payer: Medicare HMO

## 2023-08-09 ENCOUNTER — Ambulatory Visit: Payer: Medicare HMO | Admitting: Gastroenterology

## 2023-08-09 VITALS — BP 124/80 | HR 98 | Ht 69.0 in | Wt 219.0 lb

## 2023-08-09 DIAGNOSIS — K7030 Alcoholic cirrhosis of liver without ascites: Secondary | ICD-10-CM

## 2023-08-09 DIAGNOSIS — I8511 Secondary esophageal varices with bleeding: Secondary | ICD-10-CM | POA: Diagnosis not present

## 2023-08-09 DIAGNOSIS — I8501 Esophageal varices with bleeding: Secondary | ICD-10-CM

## 2023-08-09 LAB — CBC WITH DIFFERENTIAL/PLATELET
Basophils Absolute: 0.1 10*3/uL (ref 0.0–0.1)
Basophils Relative: 0.7 % (ref 0.0–3.0)
Eosinophils Absolute: 0.3 10*3/uL (ref 0.0–0.7)
Eosinophils Relative: 3 % (ref 0.0–5.0)
HCT: 44.4 % (ref 39.0–52.0)
Hemoglobin: 14.2 g/dL (ref 13.0–17.0)
Lymphocytes Relative: 26.8 % (ref 12.0–46.0)
Lymphs Abs: 2.2 10*3/uL (ref 0.7–4.0)
MCHC: 31.9 g/dL (ref 30.0–36.0)
MCV: 81.2 fL (ref 78.0–100.0)
Monocytes Absolute: 0.6 10*3/uL (ref 0.1–1.0)
Monocytes Relative: 7.8 % (ref 3.0–12.0)
Neutro Abs: 5.1 10*3/uL (ref 1.4–7.7)
Neutrophils Relative %: 61.7 % (ref 43.0–77.0)
Platelets: 182 10*3/uL (ref 150.0–400.0)
RBC: 5.46 Mil/uL (ref 4.22–5.81)
RDW: 20.8 % — ABNORMAL HIGH (ref 11.5–15.5)
WBC: 8.3 10*3/uL (ref 4.0–10.5)

## 2023-08-09 LAB — COMPREHENSIVE METABOLIC PANEL
ALT: 29 U/L (ref 0–53)
AST: 28 U/L (ref 0–37)
Albumin: 4.4 g/dL (ref 3.5–5.2)
Alkaline Phosphatase: 89 U/L (ref 39–117)
BUN: 10 mg/dL (ref 6–23)
CO2: 31 meq/L (ref 19–32)
Calcium: 9.6 mg/dL (ref 8.4–10.5)
Chloride: 105 meq/L (ref 96–112)
Creatinine, Ser: 0.83 mg/dL (ref 0.40–1.50)
GFR: 101.21 mL/min (ref >60.00)
Glucose, Bld: 98 mg/dL (ref 70–99)
Potassium: 4.1 meq/L (ref 3.5–5.1)
Sodium: 140 meq/L (ref 135–145)
Total Bilirubin: 0.5 mg/dL (ref 0.2–1.2)
Total Protein: 7.5 g/dL (ref 6.0–8.3)

## 2023-08-09 LAB — PROTIME-INR
INR: 1.1 {ratio} — ABNORMAL HIGH (ref 0.8–1.0)
Prothrombin Time: 11.8 s (ref 9.6–13.1)

## 2023-08-09 NOTE — Progress Notes (Signed)
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  Recall he was previously treated for hep C with undetectable viral load.  He has a history of bleeding esophageal varices in January 22 when he relapsed with alcohol, he underwent banding at that time and has had surveillance EGDs and on Coreg since then.  On prior visits in recent years he had relapsed with significant alcohol use while dealing with life stressors and taking care of his mother who had dementia.  He was actually mated to the hospital for alcohol detox and experienced withdrawal symptoms and was in the ICU for few days.  He has been doing really well since have seen him.  He has been off all alcohol for the past 1.5 years without any relapse.  He is doing well.  Since his last endoscopy we had switched back his beta-blocker from atenolol to Coreg.  He is taking that twice daily and compliant.  No jaundice, no edema, no internal bleeding, no encephalopathy.  He takes Protonix for history of GERD and that works well to control symptoms.  He had a right upper quadrant ultrasound in July for [facility] screening which was negative.   He has been vaccinated to hep A and B and completed the series in 2019.    Prior workup: EGD 01/06/2018 - 2cm HH, no varices, portal hypertensive gastriits EGD 11/25/18 - large esophageal varices, one with red whale sign - banded x 6, severe portal hypertensive gastritis EGD 12/16/18 - grade II varices in the lower esophagus, procedure was aborted due to large volume of food in the stomach, no banding done   CT scan 12/11/2008 - cirrhotic appearing liver, ? Wall thickening of the ascending and proximal descending colon, small left inguinal and umbilical hernias   EGD 01/19/19 - Esophagogastric landmarks identified. - Grade II esophageal varix - overall improved since last exam. Banded x 3. - Mild portal hypertensive gastritis. - Normal duodenal  bulb and second portion of the duodenum.   Colonoscopy - 01/19/19 - One 8 mm polyp in the cecum, removed with a cold snare. Resected and retrieved. - Two 5 to 6 mm polyps in the transverse colon, removed with a cold snare. Resected and retrieved. - One 5 mm polyp in the descending colon, removed with a cold snare. Resected and retrieved. - One 5 mm polyp in the sigmoid colon, removed with a cold snare. Resected and retrieved. - Mulitple suspected rectal hyperplastic polyps - two representative lesions removed with a cold snare. Resected and retrieved. - One 8 mm polyp in the rectum, removed with a hot snare. Resected and retrieved. - Diverticulosis in the transverse colon and in the ascending colon. - Tortous colon - Edema throughout, consistent with change from portal hypertension, I suspect the cause of CT changes. - Internal hemorrhoids. - The examination was otherwise normal.   1. Colon, polyp(s), cecum, transverse - TUBULAR ADENOMA. - SESSILE SERRATED POLYP WITHOUT DYSPLASIA (X5 FRAGMENTS). - NO HIGH GRADE DYSPLASIA OR MALIGNANCY. 2. Colon, polyp(s), sigmoid, rectal, descending - HYPERPLASTIC POLYP (X5 FRAGMENTS). - NO DYSPLASIA OR MALIGNANCY.   EGD 04/28/19 - Esophagogastric landmarks identified. - Small esophageal varices that easily flattened with insufflation and stigmata of prior banding noted. No further banding performed today. - Normal stomach. - Normal duodenal bulb and second portion of the duodenum.   EGD 11/03/19 - Dr. Myrtie Neither. Grade I varices were found in the lower third  of the esophagus. No banding required. Portal hypertensive gastropathy was found in the entire examined stomach. A large amount of food (residue) was found in the entire examined stomach. This limited visualization. Retroflexion performed in stomach, but gastric cardia/fundus not well visualized as a result of retained Food. The examined duodenum was normal.     EGD 01/24/21 -  Trace varices were  found in the lower third of the esophagus. They were small in size. Not amenable to banding. There was a protuberance of benign appearing mucosa at the distal esophagus I suspect scarring from prior banding The exam of the esophagus was otherwise normal. Mild portal hypertensive gastropathy was found in the gastric fundus and in the gastric body. The exam of the stomach was otherwise normal. The duodenal bulb and second portion of the duodenum were normal.   Echo 04/10/21 - EF 60-65%    EGD 06/15/22: - Esophagogastric landmarks were identified: the Z-line was found at 43 cm, the gastroesophageal junction was found at 43 cm and the upper extent of the gastric folds was found at 43 cm from the incisors. Findings: - Small varices that flattened with insufflation were found in the lower third of the esophagus. No high risk stigmata for bleeding. - The exam of the esophagus was otherwise normal. - Portal hypertensive gastropathy was found in the entire examined stomach - mucosa was friable to lavage with water jet. - The exam of the stomach was otherwise normal. No gastric varices. - The examined duodenum was normal.   Repeat EGD in 1 year, also switched back to nadolol / coreg  Colonoscopy 06/15/22: - The perianal and digital rectal examinations were normal. - A 3 mm polyp was found in the cecum. The polyp was sessile. The polyp was removed with a cold snare. Resection and retrieval were complete. - A 3 mm polyp was found in the ascending colon. The polyp was sessile. The polyp was removed with a cold snare. Resection and retrieval were complete. - Multiple small-mouthed diverticula were found in the ascending colon and left colon. - Internal hemorrhoids were found during retroflexion. - The exam was otherwise without abnormality. Of note, the IC valve was normal. I thought a photo of it was taken, but it was not.  Diagnosis Surgical [P], colon, cecum, ascending, polyp (2) - TUBULAR ADENOMA (1) WITHOUT  HIGH GRADE DYSPLASIA. - COLONIC MUCOSA WITH BENIGN LYMPHOID AGGREGATE (1).   RUQ 06/10/23: IMPRESSION: 1. No acute abnormality identified. 2. Lobular contour of the liver with increased echotexture consistent with cirrhosis.   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   Constipation    Resolved   COPD (chronic obstructive pulmonary disease) (HCC)    DDD (degenerative disc disease), cervical    Diverticulosis    ED (erectile dysfunction)    Esophageal varices (HCC)    GERD (gastroesophageal reflux disease)    H/O: upper GI bleed    Hepatitis C antibody positive in blood    resolved   High blood pressure    History of anemia    Internal hemorrhoids    Left inguinal hernia 2020   Lung nodule    Small   Portal hypertensive gastropathy (HCC)    Sleep apnea    trying to get used to his CPAP, not wearing regularly   Umbilical  hernia 2020     Past Surgical History:  Procedure Laterality Date   arm surgery     left arm,   COLONOSCOPY WITH PROPOFOL N/A 01/19/2019   Procedure: COLONOSCOPY WITH PROPOFOL;  Surgeon: Benancio Deeds, MD;  [facility]: [facility];  Service: Gastroenterology;  Laterality: N/A;   ESOPHAGEAL BANDING  11/25/2018   Procedure: ESOPHAGEAL BANDING;  Surgeon: Rachael Fee, MD;  Location: [facility];  Service: Endoscopy;;   ESOPHAGEAL BANDING  01/19/2019   Procedure: ESOPHAGEAL BANDING;  Surgeon: Benancio Deeds, MD;  Location: [facility];  Service: Gastroenterology;;   ESOPHAGEAL BANDING N/A 01/24/2021   Procedure: ESOPHAGEAL BANDING;  Surgeon: Benancio Deeds, MD;  [facility];  Service: Gastroenterology;  Laterality: N/A;   ESOPHAGOGASTRODUODENOSCOPY (EGD) WITH PROPOFOL N/A 11/25/2018   Procedure: ESOPHAGOGASTRODUODENOSCOPY (EGD) WITH PROPOFOL;  Surgeon: Rachael Fee, MD;  Location:  [facility];  Service: Endoscopy;  Laterality: N/A;   ESOPHAGOGASTRODUODENOSCOPY (EGD) WITH PROPOFOL N/A 01/19/2019   Procedure: ESOPHAGOGASTRODUODENOSCOPY (EGD) WITH PROPOFOL;  Surgeon: Benancio Deeds, MD;  [facility]: [facility];  Service: Gastroenterology;  Laterality: N/A;   ESOPHAGOGASTRODUODENOSCOPY (EGD) WITH PROPOFOL N/A 04/28/2019   Procedure: ESOPHAGOGASTRODUODENOSCOPY (EGD) WITH PROPOFOL;  Surgeon: Benancio Deeds, MD;  [facility]: [facility];  Service: Gastroenterology;  Laterality: N/A;   ESOPHAGOGASTRODUODENOSCOPY (EGD) WITH PROPOFOL N/A 11/03/2019   Procedure: ESOPHAGOGASTRODUODENOSCOPY (EGD) WITH PROPOFOL;  Surgeon: Sherrilyn Rist, MD;  [facility]: [facility];  Service: Gastroenterology;  Laterality: N/A;   ESOPHAGOGASTRODUODENOSCOPY (EGD) WITH PROPOFOL N/A 01/24/2021   Procedure: ESOPHAGOGASTRODUODENOSCOPY (EGD) WITH PROPOFOL;  Surgeon: Benancio Deeds, MD;  Location: [facility];  Service: Gastroenterology;  Laterality: N/A;   KNEE SURGERY Left    POLYPECTOMY  01/19/2019   Procedure: POLYPECTOMY;  Surgeon: Benancio Deeds, MD;  [facility]: [facility];  Service: Gastroenterology;;   Family History  Problem Relation Age of Onset   Colitis Mother    Arthritis Mother    Dementia Mother    Cancer Father        lymphoma?   Cancer Paternal Grandfather        bone   Heart disease Neg Hx    Stroke Neg Hx    Diabetes Neg Hx    Colon cancer Neg Hx    Esophageal cancer Neg Hx    Stomach cancer Neg Hx    Rectal cancer Neg Hx    Social History   Tobacco Use   Smoking status: Every Day    Current packs/day: 0.50    Average packs/day: 0.5 packs/day for 30.0 years (15.0 ttl pk-yrs)    Types: Cigarettes    Passive exposure: Current   Smokeless tobacco: Former    Types: Engineer, drilling   Vaping status: Never Used  Substance Use Topics   Alcohol use: Not Currently    Alcohol/week: 36.0 standard drinks of alcohol    Types: 36 Cans of beer per week     Comment: quit March 2023   Drug use: Yes    Types: Marijuana    Comment: daily use   Current Outpatient Medications  Medication Sig Dispense Refill   azelastine (ASTELIN) 0.1 % nasal spray Place 1 spray into both nostrils daily as needed for allergies.     carvedilol (COREG) 6.25 MG tablet Take 1 tablet (6.25 mg total) by mouth 2 (two) times daily with a meal. 60 tablet 3   fluticasone furoate-vilanterol (BREO ELLIPTA) 200-25 MCG/ACT AEPB Inhale 1 puff into the lungs daily. 180 each 3  folic acid (FOLVITE) 1 MG tablet Take 1 mg by mouth in the morning.     loratadine (CLARITIN) 10 MG tablet Take 10 mg by mouth daily.     losartan (COZAAR) 100 MG tablet Take 100 mg by mouth daily.     Multiple Vitamin (MULTIVITAMIN WITH MINERALS) TABS tablet Take 1 tablet by mouth daily with breakfast.     Multiple Vitamin (MULTIVITAMIN) capsule Take 1 capsule by mouth daily.     naltrexone (DEPADE) 50 MG tablet Take 1 tablet (50 mg total) by mouth daily. 30 tablet 0   nicotine (NICODERM CQ - DOSED IN MG/24 HOURS) 21 mg/24hr patch Place 1 patch (21 mg total) onto the skin daily. 28 patch 3   pantoprazole (PROTONIX) 40 MG tablet Take 1 tablet (40 mg total) by mouth daily.     polyethylene glycol (MIRALAX / GLYCOLAX) 17 g packet Take 17 g by mouth daily. 14 each 0   prazosin (MINIPRESS) 1 MG capsule Take 1 capsule (1 mg total) by mouth at bedtime. 30 capsule 3   sildenafil (VIAGRA) 100 MG tablet 1/2-1 tab po prn 20 tablet prn   tamsulosin (FLOMAX) 0.4 MG CAPS capsule Take 1 capsule (0.4 mg total) by mouth daily. 30 capsule 11   Thiamine HCl (VITAMIN B1) 100 MG TABS Take 1 tablet (100 mg total) by mouth in the morning. 30 tablet 3   traZODone (DESYREL) 50 MG tablet Take 1 tablet (50 mg total) by mouth at bedtime. For sleep 30 tablet 6   venlafaxine XR (EFFEXOR-XR) 75 MG 24 hr capsule TAKE 1 CAPSULE BY MOUTH DAILY. 30 capsule 0   VENTOLIN HFA 108 (90 Base) MCG/ACT inhaler INHALE 1 PUFF EVERY 6 (SIX) HOURS AS  NEEDED INTO THE LUNGS FOR WHEEZING OR SHORTNESS OF BREATH. 18 g 1   PHENObarbital (LUMINAL) 30 MG tablet Take 1-2 tablets (30-60 mg total) by mouth at bedtime. As needed for sleep, anxiety, agitation 60 tablet 3   No current facility-administered medications for this visit.   Allergies  Allergen Reactions   Onion Anaphylaxis   Gabapentin Other (See Comments)    Restless legs   Naproxen Hives and Swelling     Review of Systems: All systems reviewed and negative except where noted in HPI.   Lab Results  Component Value Date   WBC 5.0 01/29/2022   HGB 11.5 (L) 01/29/2022   HCT 35.8 (L) 01/29/2022   MCV 89.9 01/29/2022   PLT 136 (L) 01/29/2022    Lab Results  Component Value Date   NA 135 01/29/2022   CL 105 01/29/2022   K 4.0 01/29/2022   CO2 24 01/29/2022   BUN 12 01/29/2022   CREATININE 0.74 01/29/2022   GFRNONAA >60 01/29/2022   CALCIUM 8.7 (L) 01/29/2022   PHOS 3.5 01/26/2022   ALBUMIN 3.3 (L) 01/29/2022   GLUCOSE 105 (H) 01/29/2022    Lab Results  Component Value Date   ALT 40 01/29/2022   AST 44 (H) 01/29/2022   ALKPHOS 77 01/29/2022   BILITOT 0.2 (L) 01/29/2022    Lab Results  Component Value Date   INR 1.1 01/23/2022   INR 1.1 (H) 01/15/2022   INR 1.1 (H) 07/05/2021    Physical Exam: BP 124/80   Pulse 98   Ht 5\' 9"  (1.753 m)   Wt 219 lb (99.3 kg)   SpO2 98%   BMI 32.34 kg/m  Constitutional: Pleasant,well-developed, male in no acute distress. Neurological: Alert and oriented to person place and time.  Psychiatric: Normal mood and affect. Behavior is normal.   ASSESSMENT: 52 y.o. male here for assessment of the following  1. Alcoholic cirrhosis of liver without ascites (HCC)   2. Bleeding esophageal varices, unspecified esophageal varices type (HCC)    Has been off alcohol for the past 1-1/2 years since hospitalization for detox and doing really well in that regard.  No decompensation since have seen him.  He had a right upper quadrant  ultrasound for HCC screening that is up-to-date.  He is overdue for routine blood work.  We will send to the lab for basic labs today.  Otherwise he is due for surveillance endoscopy to reassess his varices.  Hopefully on Coreg risk for rebleeding is low.  I discussed risks and benefits of endoscopy and anesthesia, he wishes to proceed with this.  Otherwise continue Coreg, avoid all alcohol.  Follow-up in 6 months or sooner with any issues  PLAN: - lab today - CBC, CMET, INR, AFP - EGD at the [facility] for varices surveillance - RUQ [place] to be done in January - recall - avoid all alcohol - continue Coreg - f/u 6 months  Harlin Rain, MD [facility]

## 2023-08-09 NOTE — Patient Instructions (Addendum)
Please go to the lab in the basement of our building to have lab work done as you leave today. Hit "B" for basement when you get on the elevator.  When the doors open the lab is on your left.  We will call you with the results. Thank you.   You have been scheduled for an endoscopy. Please follow written instructions given to you at your visit today.  If you use inhalers (even only as needed), please bring them with you on the day of your procedure.  If you take any of the following medications, they will need to be adjusted prior to your procedure:   DO NOT TAKE 7 DAYS PRIOR TO TEST- Trulicity (dulaglutide) Ozempic, Wegovy (semaglutide) Mounjaro (tirzepatide) Bydureon Bcise (exanatide extended release)  DO NOT TAKE 1 DAY PRIOR TO YOUR TEST Rybelsus (semaglutide) Adlyxin (lixisenatide) Victoza (liraglutide) Byetta (exanatide) ___________________________________________________________________________  Avoid all alcohol.  Continue Coreg.  Please follow up in 6 months.  Thank you for entrusting me with your care and for choosing [facility], Dr. Ileene Patrick    If your blood pressure at your visit was 140/90 or greater, please contact your primary care physician to follow up on this. ______________________________________________________  If you are age 8 or older, your body mass index should be between 23-30. Your Body mass index is 32.34 kg/m. If this is out of the aforementioned range listed, please consider follow up with your Primary Care Provider.  If you are age 72 or younger, your body mass index should be between 19-25. Your Body mass index is 32.34 kg/m. If this is out of the aformentioned range listed, please consider follow up with your Primary Care Provider.  ________________________________________________________  The [place] providers would like to encourage you to use [facility] to communicate with providers for non-urgent requests or questions.   Due to long hold times on the telephone, sending your provider a message by [facility] may be a faster and more efficient way to get a response.  Please allow 48 business hours for a response.  Please remember that this is for non-urgent requests.  _______________________________________________________  Due to recent changes in healthcare laws, you may see the results of your imaging and laboratory studies on MyChart before your provider has had a chance to review them.  We understand that in some cases there may be results that are confusing or concerning to you. Not all laboratory results come back in the same time frame and the provider may be waiting for multiple results in order to interpret others.  Please give [place] 48 hours in order for your provider to thoroughly review all the results before contacting the office for clarification of your results.

## 2023-08-12 LAB — AFP TUMOR MARKER: AFP-Tumor Marker: 2.1 ng/mL (ref <6.1)

## 2023-08-20 ENCOUNTER — Encounter: Payer: Medicare HMO | Admitting: Gastroenterology

## 2023-08-27 ENCOUNTER — Other Ambulatory Visit (INDEPENDENT_AMBULATORY_CARE_PROVIDER_SITE_OTHER): Payer: Medicare HMO

## 2023-08-27 ENCOUNTER — Ambulatory Visit: Payer: Medicare HMO | Admitting: Orthopaedic Surgery

## 2023-08-27 ENCOUNTER — Encounter: Payer: Medicare HMO | Admitting: Gastroenterology

## 2023-08-27 ENCOUNTER — Telehealth: Payer: Self-pay | Admitting: Gastroenterology

## 2023-08-27 DIAGNOSIS — M1732 Unilateral post-traumatic osteoarthritis, left knee: Secondary | ICD-10-CM

## 2023-08-27 NOTE — Telephone Encounter (Signed)
Okay sorry to hear this, thanks for letting me know.

## 2023-08-27 NOTE — Progress Notes (Signed)
Office Visit Note   Patient: Levi Alvarez           Date of Birth: Jun 04, 1971           MRN: [id number] Visit Date: 08/27/2023              Requested by: Raymon Mutton., FNP [address] [place],  [place] [address] PCP: Raymon Mutton., FNP   Assessment & Plan: Visit Diagnoses:  1. Post-traumatic osteoarthritis of left knee     Plan: Impression is severe left knee degenerative joint disease secondary to Post-traumatic arthritis.  Bone on bone joint space narrowing is seen on radiographs with mild varus alignment.  At this point, conservative treatments fail to provide any significant relief and the pain is severely affecting ADLs and quality of life.  Based on treatment options, the patient has elected to move forward with a knee replacement.  We have discussed the surgical risks that include but are not limited to infection, DVT, leg length discrepancy, stiffness, numbness, tingling, incomplete relief of pain.  Recovery and prognosis were also reviewed.    Due to history of alcoholism and hepatitis C we will obtain prealbumin and hepatitis C panel today.  Anticoagulants: No antithrombotic Postop anticoagulation: Aspirin 81 mg Diabetic: No  Nickel allergy: No Prior DVT/PE: No Tobacco use: 1/2 ppd Clearances needed for surgery: Fatima Sanger PCP Anticipated discharge dispo: Home   Total face to face encounter time was greater than 45 minutes and over half of this time was spent in counseling and/or coordination of care.   Follow-Up Instructions: No follow-ups on file.   Orders:  Orders Placed This Encounter  Procedures   XR KNEE 3 VIEW LEFT   No orders of the defined types were placed in this encounter.     Procedures: No procedures performed   Clinical Data: No additional findings.   Subjective: Chief Complaint  Patient presents with   Left Knee - Pain    HPI Mr. Raul Del is a very pleasant 52 year old gentleman who comes in for chronic severe left knee pain  for years.  He had a multi ligamentous knee reconstruction many years ago.  This was done elsewhere.  His pain is mostly medial but does feel pain throughout the knee.  He reports catching and grinding.  He has had several cortisone injections in the past which originally gave temporary relief but now no longer is effective.  Review of Systems  Constitutional: Negative.   HENT: Negative.    Eyes: Negative.   Respiratory: Negative.    Cardiovascular: Negative.   Gastrointestinal: Negative.   Endocrine: Negative.   Genitourinary: Negative.   Skin: Negative.   Allergic/Immunologic: Negative.   Neurological: Negative.   Hematological: Negative.   Psychiatric/Behavioral: Negative.    All other systems reviewed and are negative.    Objective: Vital Signs: There were no vitals taken for this visit.  Physical Exam Vitals and nursing note reviewed.  Constitutional:      Appearance: He is well-developed.  HENT:     Head: Normocephalic and atraumatic.  Eyes:     Pupils: Pupils are equal, round, and reactive to light.  Pulmonary:     Effort: Pulmonary effort is normal.  Abdominal:     Palpations: Abdomen is soft.  Musculoskeletal:        General: Normal range of motion.     Cervical back: Neck supple.  Skin:    General: Skin is warm.  Neurological:  Mental Status: He is alert and oriented to person, place, and time.  Psychiatric:        Behavior: Behavior normal.        Thought Content: Thought content normal.        Judgment: Judgment normal.     Ortho Exam Exam of the left knee shows fully healed surgical scars throughout the knee.  Range of motion is 0 to 95 degrees.  Collaterals and cruciates are stable.  He does have a prominent screw on the proximal medial side of the tibia.  Specialty Comments:  No specialty comments available.  Imaging: No results found.   PMFS History: Patient Active Problem List   Diagnosis Date Noted   Hyponatremia 01/23/2022   Alcohol  withdrawal (HCC) 01/22/2022   Class 1 obesity 01/22/2022   Alcohol withdrawal syndrome without complication (HCC)    Alcohol dependence with withdrawal with complication (HCC) 04/20/2021   Marijuana abuse, continuous 04/20/2021   Pancreatitis, acute 04/10/2021   Hypotension 04/10/2021   Chest pain 04/10/2021   Esophageal varices without bleeding (HCC)    Abnormal CT scan, colon    Benign neoplasm of colon    Bleeding esophageal varices (HCC)    Spondylosis without myelopathy or radiculopathy, cervical region 12/03/2018   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   Post-traumatic osteoarthritis of left knee 12/03/2018   Chronic obstructive pulmonary disease (HCC) 12/02/2018   Upper GI bleed 11/24/2018   Acute blood loss anemia 11/24/2018   Alcohol abuse 11/24/2018   Chronic back pain 11/24/2018   Chronic hepatitis C without hepatic coma (HCC) 10/08/2017   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   Chronic pain of left knee 07/23/2017   Essential hypertension 07/12/2017   OSA (obstructive sleep apnea) 07/12/2017   Class 2 obesity due to excess calories with body mass index (BMI) of 35.0 to 35.9 in adult 07/12/2017   Elevated liver enzymes 07/12/2017   Other chronic pain 07/12/2017   Smoker 07/12/2017   Hyperlipidemia 07/12/2017   Polyarthralgia 07/12/2017   Snoring 06/05/2017   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   Constipation    Resolved   COPD (chronic obstructive pulmonary disease) (HCC)    DDD (degenerative disc disease), cervical    Diverticulosis    ED (erectile dysfunction)    Esophageal varices (HCC)    GERD (gastroesophageal reflux disease)    H/O: upper GI bleed    Hepatitis C antibody positive in blood    resolved   High blood pressure    History of anemia    Internal hemorrhoids    Left inguinal hernia 2020   Lung  nodule    Small   Portal hypertensive gastropathy (HCC)    Sleep apnea    trying to get used to his CPAP, not wearing regularly   Umbilical hernia 2020    Family History  Problem Relation Age of Onset   Colitis Mother    Arthritis Mother    Dementia Mother    Cancer Father        lymphoma?   Cancer Paternal Grandfather        bone   Heart disease Neg Hx    Stroke Neg Hx    Diabetes Neg Hx    Colon cancer Neg Hx    Esophageal cancer Neg Hx  Stomach cancer Neg Hx    Rectal cancer Neg Hx     Past Surgical History:  Procedure Laterality Date   arm surgery     left arm,   COLONOSCOPY WITH PROPOFOL N/A 01/19/2019   Procedure: COLONOSCOPY WITH PROPOFOL;  Surgeon: Benancio Deeds, MD;  Location: [facility];  Service: Gastroenterology;  Laterality: N/A;   ESOPHAGEAL BANDING  11/25/2018   Procedure: ESOPHAGEAL BANDING;  Surgeon: Rachael Fee, MD;  Location: [facility];  Service: Endoscopy;;   ESOPHAGEAL BANDING  01/19/2019   Procedure: ESOPHAGEAL BANDING;  Surgeon: Benancio Deeds, MD;  Location: [facility];  Service: Gastroenterology;;   ESOPHAGEAL BANDING N/A 01/24/2021   Procedure: ESOPHAGEAL BANDING;  Surgeon: Benancio Deeds, MD;  Location: [facility];  Service: Gastroenterology;  Laterality: N/A;   ESOPHAGOGASTRODUODENOSCOPY (EGD) WITH PROPOFOL N/A 11/25/2018   Procedure: ESOPHAGOGASTRODUODENOSCOPY (EGD) WITH PROPOFOL;  Surgeon: Rachael Fee, MD;  Location: [facility];  Service: Endoscopy;  Laterality: N/A;   ESOPHAGOGASTRODUODENOSCOPY (EGD) WITH PROPOFOL N/A 01/19/2019   Procedure: ESOPHAGOGASTRODUODENOSCOPY (EGD) WITH PROPOFOL;  Surgeon: Benancio Deeds, MD;  Location: [facility];  Service: Gastroenterology;  Laterality: N/A;   ESOPHAGOGASTRODUODENOSCOPY (EGD) WITH PROPOFOL N/A 04/28/2019   Procedure: ESOPHAGOGASTRODUODENOSCOPY (EGD) WITH PROPOFOL;  Surgeon: Benancio Deeds, MD;  Location: [facility];  Service: Gastroenterology;   Laterality: N/A;   ESOPHAGOGASTRODUODENOSCOPY (EGD) WITH PROPOFOL N/A 11/03/2019   Procedure: ESOPHAGOGASTRODUODENOSCOPY (EGD) WITH PROPOFOL;  Surgeon: Sherrilyn Rist, MD;  Location: [facility];  Service: Gastroenterology;  Laterality: N/A;   ESOPHAGOGASTRODUODENOSCOPY (EGD) WITH PROPOFOL N/A 01/24/2021   Procedure: ESOPHAGOGASTRODUODENOSCOPY (EGD) WITH PROPOFOL;  Surgeon: Benancio Deeds, MD;  Location: [facility];  Service: Gastroenterology;  Laterality: N/A;   KNEE SURGERY Left    POLYPECTOMY  01/19/2019   Procedure: POLYPECTOMY;  Surgeon: Benancio Deeds, MD;  Location: [facility];  Service: Gastroenterology;;   Social History   Occupational History   Occupation: disabled  Tobacco Use   Smoking status: Every Day    Current packs/day: 0.50    Average packs/day: 0.5 packs/day for 30.0 years (15.0 ttl pk-yrs)    Types: Cigarettes    Passive exposure: Current   Smokeless tobacco: Former    Types: Engineer, drilling   Vaping status: Never Used  Substance and Sexual Activity   Alcohol use: Not Currently    Alcohol/week: 36.0 standard drinks of alcohol    Types: 36 Cans of beer per week    Comment: quit March 2023   Drug use: Yes    Types: Marijuana    Comment: daily use   Sexual activity: Yes    Partners: Female

## 2023-08-27 NOTE — Telephone Encounter (Signed)
Returned the patient's phone call and spoke with Levi Alvarez his SGO. Pt has a lot of congestion and isn't feeling well this morning. He wishes to cancel his EGD and will call back to reschedule when they can figure out transportation.

## 2023-08-27 NOTE — Telephone Encounter (Signed)
Inbound call from patient's wife stating patient will not be able to come in for procedure today at 10:00 am. Patient woke up feeling very unwell and is having a hard time breathing. Please advise, thank you.

## 2023-08-30 LAB — EXTRA SPECIMEN

## 2023-08-30 LAB — PREALBUMIN: Prealbumin: 21 mg/dL (ref 21–43)

## 2023-08-30 LAB — HEPATITIS C ANTIBODY: Hepatitis C Ab: REACTIVE — AB

## 2023-08-30 LAB — HEPATITIS C RNA QUANTITATIVE
HCV Quantitative Log: <1.18 {Log}
HCV RNA, PCR, QN: <15 [IU]/mL

## 2023-10-29 ENCOUNTER — Telehealth: Payer: Self-pay | Admitting: Orthopaedic Surgery

## 2023-10-29 NOTE — Telephone Encounter (Signed)
Left message on patient's voicemail, asking that he call me directly at [phone] to reschedule his left total knee with Dr Roda Shutters from 01-13-24 because Dr. Roda Shutters will not be available to do his surgery on this date.

## 2023-12-04 ENCOUNTER — Ambulatory Visit (INDEPENDENT_AMBULATORY_CARE_PROVIDER_SITE_OTHER): Payer: Medicare HMO | Admitting: Internal Medicine

## 2023-12-04 ENCOUNTER — Encounter: Payer: Self-pay | Admitting: Internal Medicine

## 2023-12-04 VITALS — BP 126/83 | HR 61 | Ht 69.0 in | Wt 201.8 lb

## 2023-12-04 DIAGNOSIS — R39198 Other difficulties with micturition: Secondary | ICD-10-CM | POA: Insufficient documentation

## 2023-12-04 DIAGNOSIS — G4733 Obstructive sleep apnea (adult) (pediatric): Secondary | ICD-10-CM | POA: Diagnosis not present

## 2023-12-04 DIAGNOSIS — K7030 Alcoholic cirrhosis of liver without ascites: Secondary | ICD-10-CM

## 2023-12-04 DIAGNOSIS — M1732 Unilateral post-traumatic osteoarthritis, left knee: Secondary | ICD-10-CM

## 2023-12-04 DIAGNOSIS — Z8619 Personal history of other infectious and parasitic diseases: Secondary | ICD-10-CM | POA: Insufficient documentation

## 2023-12-04 DIAGNOSIS — K219 Gastro-esophageal reflux disease without esophagitis: Secondary | ICD-10-CM | POA: Insufficient documentation

## 2023-12-04 DIAGNOSIS — G4700 Insomnia, unspecified: Secondary | ICD-10-CM | POA: Insufficient documentation

## 2023-12-04 DIAGNOSIS — E785 Hyperlipidemia, unspecified: Secondary | ICD-10-CM

## 2023-12-04 DIAGNOSIS — F1010 Alcohol abuse, uncomplicated: Secondary | ICD-10-CM

## 2023-12-04 DIAGNOSIS — J438 Other emphysema: Secondary | ICD-10-CM | POA: Diagnosis not present

## 2023-12-04 DIAGNOSIS — N529 Male erectile dysfunction, unspecified: Secondary | ICD-10-CM | POA: Insufficient documentation

## 2023-12-04 DIAGNOSIS — F1210 Cannabis abuse, uncomplicated: Secondary | ICD-10-CM

## 2023-12-04 DIAGNOSIS — E663 Overweight: Secondary | ICD-10-CM

## 2023-12-04 DIAGNOSIS — F5104 Psychophysiologic insomnia: Secondary | ICD-10-CM

## 2023-12-04 DIAGNOSIS — I10 Essential (primary) hypertension: Secondary | ICD-10-CM

## 2023-12-04 DIAGNOSIS — F3342 Major depressive disorder, recurrent, in full remission: Secondary | ICD-10-CM

## 2023-12-04 DIAGNOSIS — F329 Major depressive disorder, single episode, unspecified: Secondary | ICD-10-CM | POA: Insufficient documentation

## 2023-12-04 DIAGNOSIS — E291 Testicular hypofunction: Secondary | ICD-10-CM | POA: Insufficient documentation

## 2023-12-04 NOTE — Assessment & Plan Note (Signed)
Followed by urology (Dr. Retta Diones).  He is prescribed sildenafil for as needed use.

## 2023-12-04 NOTE — Patient Instructions (Signed)
It was a pleasure to see you today.  Thank you for giving [place] the opportunity to be involved in your care.  Below is a brief recap of your visit and next steps.  We will plan to see you again in 6 months.   Summary You have established care today We will update basic labs and ECG Follow up in 6 months

## 2023-12-04 NOTE — Assessment & Plan Note (Signed)
Posttraumatic left knee OA.  Followed by orthopedic surgery and plans to undergo TKA on 01/20/24 with Dr. Roda Shutters. -Will update labs and ECG in anticipation of upcoming surgery

## 2023-12-04 NOTE — Assessment & Plan Note (Signed)
He is prescribed Flomax.  Denies current symptoms.

## 2023-12-04 NOTE — Assessment & Plan Note (Signed)
Followed by gastroenterology (Dr. Adela Lank).  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  He is currently prescribed carvedilol.  He endorses close to 2 years of sobriety from alcohol use.

## 2023-12-04 NOTE — Assessment & Plan Note (Signed)
Currently prescribed venlafaxine 75 mg daily.  Mood is stable on this regimen.  No medication changes are indicated at this time.

## 2023-12-04 NOTE — Assessment & Plan Note (Signed)
Currently prescribed testosterone replacement therapy.  This was managed by his previous PCP.  Will update free/total AM testosterone levels.  Controlled substance agreement signed today.

## 2023-12-04 NOTE — Assessment & Plan Note (Signed)
Adequately controlled with trazodone 50 mg nightly.

## 2023-12-04 NOTE — Assessment & Plan Note (Signed)
Adequately controlled with carvedilol 6.25 mg twice daily.  No medication changes are indicated today.

## 2023-12-04 NOTE — Assessment & Plan Note (Signed)
Previously documented history of sleep apnea.  Not requiring CPAP currently.

## 2023-12-04 NOTE — Assessment & Plan Note (Signed)
History of treated HCV.  Followed by gastroenterology.  HCC screening is up-to-date.

## 2023-12-04 NOTE — Assessment & Plan Note (Signed)
Symptoms are adequately controlled with Protonix 40 mg daily.

## 2023-12-04 NOTE — Assessment & Plan Note (Signed)
He endorses a history of COPD.  Asymptomatic currently.  Pulmonary exam is unremarkable.  He is prescribed an albuterol inhaler for as needed use, which is infrequent.

## 2023-12-04 NOTE — Progress Notes (Signed)
New Patient Office Visit  Subjective    Patient ID: Levi Alvarez, male    DOB: 09-28-1971  Age: 53 y.o. MRN: [id number]  CC:  Chief Complaint  Patient presents with   Establish Care    HPI [name] presents to establish care.  He is a 53 year old male with a previously documented past medical history significant for alcoholic cirrhosis, HCV (treated), alcohol use disorder, GERD, BPH, HTN, MDD, insomnia, hypogonadism, ED, COPD, OSA, and chronic musculoskeletal pain.  Previously followed by Fatima Sanger, NP.  Mr. Lanzer reports feeling well today.  He is asymptomatic and has no acute concerns to discuss aside from desiring to establish care.  He is currently disabled secondary to chronic musculoskeletal pain.  He endorses current tobacco use, smoking 0.5 packs/day of cigarettes and has been smoking close to 1 pack/day of cigarettes since he was a teenager.  He denies alcohol use, endorsing close to 2 years of sobriety.  He endorses daily marijuana use.  Family medical history is significant for multiple cancers.  Chronic medical conditions and outstanding preventative care items discussed today are individually addressed in A/P below.   Outpatient Encounter Medications as of 12/04/2023  Medication Sig   carvedilol (COREG) 6.25 MG tablet Take 1 tablet (6.25 mg total) by mouth 2 (two) times daily with a meal.   folic acid (FOLVITE) 1 MG tablet Take 1 mg by mouth in the morning.   Multiple Vitamin (MULTIVITAMIN WITH MINERALS) TABS tablet Take 1 tablet by mouth daily with breakfast.   Multiple Vitamin (MULTIVITAMIN) capsule Take 1 capsule by mouth daily.   pantoprazole (PROTONIX) 40 MG tablet Take 1 tablet (40 mg total) by mouth daily.   sildenafil (VIAGRA) 100 MG tablet 1/2-1 tab po prn   tamsulosin (FLOMAX) 0.4 MG CAPS capsule Take 1 capsule (0.4 mg total) by mouth daily.   testosterone cypionate (DEPOTESTOSTERONE CYPIONATE) 200 MG/ML injection SMARTSIG:0.5 Milliliter(s) SUB-Q Once a Week    Thiamine HCl (VITAMIN B1) 100 MG TABS Take 1 tablet (100 mg total) by mouth in the morning.   traZODone (DESYREL) 50 MG tablet Take 1 tablet (50 mg total) by mouth at bedtime. For sleep   venlafaxine XR (EFFEXOR-XR) 75 MG 24 hr capsule TAKE 1 CAPSULE BY MOUTH DAILY.   azelastine (ASTELIN) 0.1 % nasal spray Place 1 spray into both nostrils daily as needed for allergies.   fluticasone furoate-vilanterol (BREO ELLIPTA) 200-25 MCG/ACT AEPB Inhale 1 puff into the lungs daily.   loratadine (CLARITIN) 10 MG tablet Take 10 mg by mouth daily.   VENTOLIN HFA 108 (90 Base) MCG/ACT inhaler INHALE 1 PUFF EVERY 6 (SIX) HOURS AS NEEDED INTO THE LUNGS FOR WHEEZING OR SHORTNESS OF BREATH.   [DISCONTINUED] losartan (COZAAR) 100 MG tablet Take 100 mg by mouth daily.   [DISCONTINUED] naltrexone (DEPADE) 50 MG tablet Take 1 tablet (50 mg total) by mouth daily.   [DISCONTINUED] nicotine (NICODERM CQ - DOSED IN MG/24 HOURS) 21 mg/24hr patch Place 1 patch (21 mg total) onto the skin daily.   [DISCONTINUED] PHENObarbital (LUMINAL) 30 MG tablet Take 1-2 tablets (30-60 mg total) by mouth at bedtime. As needed for sleep, anxiety, agitation   [DISCONTINUED] polyethylene glycol (MIRALAX / GLYCOLAX) 17 g packet Take 17 g by mouth daily.   [DISCONTINUED] prazosin (MINIPRESS) 1 MG capsule Take 1 capsule (1 mg total) by mouth at bedtime.   No facility-administered encounter medications on file as of 12/04/2023.    Past Medical History:  Diagnosis Date  Alcoholism (HCC)    Blood transfusion without reported diagnosis    jan, 2020 3 units PRBC   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   Constipation    Resolved   COPD (chronic obstructive pulmonary disease) (HCC)    DDD (degenerative disc disease), cervical    Diverticulosis    ED (erectile dysfunction)    Esophageal varices (HCC)    GERD (gastroesophageal reflux disease)    H/O: upper GI bleed    Hepatitis C antibody positive in blood    resolved   High  blood pressure    History of anemia    Internal hemorrhoids    Left inguinal hernia 2020   Lung nodule    Small   Portal hypertensive gastropathy (HCC)    Sleep apnea    trying to get used to his CPAP, not wearing regularly   Umbilical hernia 2020    Past Surgical History:  Procedure Laterality Date   arm surgery     left arm,   COLONOSCOPY WITH PROPOFOL N/A 01/19/2019   Procedure: COLONOSCOPY WITH PROPOFOL;  Surgeon: Benancio Deeds, MD;  Location: [facility];  Service: Gastroenterology;  Laterality: N/A;   ESOPHAGEAL BANDING  11/25/2018   Procedure: ESOPHAGEAL BANDING;  Surgeon: Rachael Fee, MD;  Location: [facility];  Service: Endoscopy;;   ESOPHAGEAL BANDING  01/19/2019   Procedure: ESOPHAGEAL BANDING;  Surgeon: Benancio Deeds, MD;  Location: [facility];  Service: Gastroenterology;;   ESOPHAGEAL BANDING N/A 01/24/2021   Procedure: ESOPHAGEAL BANDING;  Surgeon: Benancio Deeds, MD;  Location: [facility];  Service: Gastroenterology;  Laterality: N/A;   ESOPHAGOGASTRODUODENOSCOPY (EGD) WITH PROPOFOL N/A 11/25/2018   Procedure: ESOPHAGOGASTRODUODENOSCOPY (EGD) WITH PROPOFOL;  Surgeon: Rachael Fee, MD;  Location: [facility];  Service: Endoscopy;  Laterality: N/A;   ESOPHAGOGASTRODUODENOSCOPY (EGD) WITH PROPOFOL N/A 01/19/2019   Procedure: ESOPHAGOGASTRODUODENOSCOPY (EGD) WITH PROPOFOL;  Surgeon: Benancio Deeds, MD;  Location: [facility];  Service: Gastroenterology;  Laterality: N/A;   ESOPHAGOGASTRODUODENOSCOPY (EGD) WITH PROPOFOL N/A 04/28/2019   Procedure: ESOPHAGOGASTRODUODENOSCOPY (EGD) WITH PROPOFOL;  Surgeon: Benancio Deeds, MD;  Location: [facility];  Service: Gastroenterology;  Laterality: N/A;   ESOPHAGOGASTRODUODENOSCOPY (EGD) WITH PROPOFOL N/A 11/03/2019   Procedure: ESOPHAGOGASTRODUODENOSCOPY (EGD) WITH PROPOFOL;  Surgeon: Sherrilyn Rist, MD;  Location: [facility];  Service: Gastroenterology;  Laterality: N/A;    ESOPHAGOGASTRODUODENOSCOPY (EGD) WITH PROPOFOL N/A 01/24/2021   Procedure: ESOPHAGOGASTRODUODENOSCOPY (EGD) WITH PROPOFOL;  Surgeon: Benancio Deeds, MD;  Location: [facility];  Service: Gastroenterology;  Laterality: N/A;   KNEE SURGERY Left    POLYPECTOMY  01/19/2019   Procedure: POLYPECTOMY;  Surgeon: Benancio Deeds, MD;  Location: [facility];  Service: Gastroenterology;;    Family History  Problem Relation Age of Onset   Colitis Mother    Arthritis Mother    Dementia Mother    Cancer Father        lymphoma?   Cancer Paternal Grandfather        bone   Heart disease Neg Hx    Stroke Neg Hx    Diabetes Neg Hx    Colon cancer Neg Hx    Esophageal cancer Neg Hx    Stomach cancer Neg Hx    Rectal cancer Neg Hx     Social History   Socioeconomic History   Marital status: Divorced    Spouse name: Not on file   Number of children: 2   Years of education: Not on  file   Highest education level: GED or equivalent  Occupational History   Occupation: disabled  Tobacco Use   Smoking status: Every Day    Current packs/day: 0.50    Average packs/day: 0.5 packs/day for 30.0 years (15.0 ttl pk-yrs)    Types: Cigarettes    Passive exposure: Current   Smokeless tobacco: Former    Types: Engineer, drilling   Vaping status: Never Used  Substance and Sexual Activity   Alcohol use: Not Currently    Alcohol/week: 36.0 standard drinks of alcohol    Types: 36 Cans of beer per week    Comment: quit March 2023   Drug use: Yes    Types: Marijuana    Comment: daily use   Sexual activity: Yes    Partners: Female  Other Topics Concern   Not on file  Social History Narrative   Not on file   Social Drivers of Health   Financial Resource Strain: Low Risk  (12/03/2023)   Overall Financial Resource Strain (CARDIA)    Difficulty of Paying Living Expenses: Not hard at all  Food Insecurity: No Food Insecurity (12/03/2023)   Hunger Vital Sign    Worried About Running Out of Food  in the Last Year: Never true    Ran Out of Food in the Last Year: Never true  Transportation Needs: No Transportation Needs (12/03/2023)   PRAPARE - Administrator, Civil Service (Medical): No    Lack of Transportation (Non-Medical): No  Physical Activity: Unknown (12/03/2023)   Exercise Vital Sign    Days of Exercise per Week: 0 days    Minutes of Exercise per Session: Not on file  Stress: No Stress Concern Present (12/03/2023)   [employer] of Occupational Health - Occupational Stress Questionnaire    Feeling of Stress : Only a little  Social Connections: Moderately Isolated (12/03/2023)   Social Connection and Isolation Panel [NHANES]    Frequency of Communication with Friends and Family: Three times a week    Frequency of Social Gatherings with Friends and Family: Three times a week    Attends Religious Services: Never    Active Member of Clubs or Organizations: No    Attends Banker Meetings: Not on file    Marital Status: Living with partner  Intimate Partner Violence: Unknown (02/16/2022)   Received from [facility]   HITS    Physically Hurt: Not on file    Insult or Talk Down To: Not on file    Threaten Physical Harm: Not on file    Scream or Curse: Not on file   Review of Systems  Constitutional:  Negative for chills and fever.  HENT:  Negative for sore throat.   Respiratory:  Negative for cough and shortness of breath.   Cardiovascular:  Negative for chest pain, palpitations and leg swelling.  Gastrointestinal:  Negative for abdominal pain, blood in stool, constipation, diarrhea, nausea and vomiting.  Genitourinary:  Negative for dysuria and hematuria.  Musculoskeletal:  Positive for back pain and joint pain. Negative for myalgias.  Skin:  Negative for itching and rash.  Neurological:  Negative for dizziness and headaches.  Psychiatric/Behavioral:  Negative for depression and suicidal ideas.     Objective    BP 126/83 (BP  Location: Right Arm, Patient Position: Sitting, Cuff Size: Normal)   Pulse 61   Ht 5\' 9"  (1.753 m)   Wt 201 lb 12.8 oz (91.5 kg)   SpO2 98%  BMI 29.80 kg/m   Physical Exam Vitals reviewed.  Constitutional:      General: He is not in acute distress.    Appearance: Normal appearance. He is not ill-appearing.  HENT:     Head: Normocephalic and atraumatic.     Right Ear: External ear normal.     Left Ear: External ear normal.     Nose: Nose normal. No congestion or rhinorrhea.     Mouth/Throat:     Mouth: Mucous membranes are moist.     Pharynx: Oropharynx is clear.  Eyes:     General: No scleral icterus.    Extraocular Movements: Extraocular movements intact.     Conjunctiva/sclera: Conjunctivae normal.     Pupils: Pupils are equal, round, and reactive to light.  Cardiovascular:     Rate and Rhythm: Normal rate and regular rhythm.     Pulses: Normal pulses.     Heart sounds: Normal heart sounds. No murmur heard. Pulmonary:     Effort: Pulmonary effort is normal.     Breath sounds: Normal breath sounds. No wheezing, rhonchi or rales.  Abdominal:     General: Abdomen is flat. Bowel sounds are normal. There is no distension.     Palpations: Abdomen is soft.     Tenderness: There is no abdominal tenderness.     Hernia: A hernia (Reducible umbilical hernia) is present.  Musculoskeletal:        General: No swelling or deformity. Normal range of motion.     Cervical back: Normal range of motion.  Skin:    General: Skin is warm and dry.     Capillary Refill: Capillary refill takes less than 2 seconds.  Neurological:     General: No focal deficit present.     Mental Status: He is alert and oriented to person, place, and time.     Motor: No weakness.  Psychiatric:        Mood and Affect: Mood normal.        Behavior: Behavior normal.        Thought Content: Thought content normal.    Assessment & Plan:   Problem List Items Addressed This Visit       Essential  hypertension - Primary   Adequately controlled with carvedilol 6.25 mg twice daily.  No medication changes are indicated today.      OSA (obstructive sleep apnea)   Previously documented history of sleep apnea.  Not requiring CPAP currently.      Chronic obstructive pulmonary disease (HCC)   He endorses a history of COPD.  Asymptomatic currently.  Pulmonary exam is unremarkable.  He is prescribed an albuterol inhaler for as needed use, which is infrequent.      Cirrhosis with alcoholism (HCC)   Followed by gastroenterology (Dr. Adela Lank).  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  He is currently prescribed carvedilol.  He endorses close to 2 years of sobriety from alcohol use.      GERD (gastroesophageal reflux disease)   Symptoms are adequately controlled with Protonix 40 mg daily      History of hepatitis C virus infection   History of treated HCV.  Followed by gastroenterology.  HCC screening is up-to-date.      Hypogonadism in male   Currently prescribed testosterone replacement therapy.  This was managed by his previous PCP.  Will update free/total AM testosterone levels.  Controlled substance agreement signed today.      Post-traumatic osteoarthritis of left knee  Posttraumatic left knee OA.  Followed by orthopedic surgery and plans to undergo TKA on 01/20/24 with Dr. Roda Shutters. -Will update labs and ECG in anticipation of upcoming surgery      Alcohol abuse (Chronic)   History of alcohol use disorder.  He reports that he is approaching 2 years of sobriety from alcohol use.      Erectile dysfunction   Followed by urology (Dr. Retta Diones).  He is prescribed sildenafil for as needed use.      MDD (major depressive disorder)   Currently prescribed venlafaxine 75 mg daily.  Mood is stable on this regimen.  No medication changes are indicated at this time.      Insomnia   Adequately controlled with trazodone 50 mg nightly.      Slow urinary stream    He is prescribed Flomax.  Denies current symptoms.      Return in about 6 months (around 06/02/2024).   Billie Lade, MD

## 2023-12-04 NOTE — Assessment & Plan Note (Signed)
History of alcohol use disorder.  He reports that he is approaching 2 years of sobriety from alcohol use.

## 2023-12-09 ENCOUNTER — Encounter: Payer: Self-pay | Admitting: Gastroenterology

## 2023-12-16 ENCOUNTER — Other Ambulatory Visit: Payer: Self-pay

## 2023-12-16 ENCOUNTER — Other Ambulatory Visit: Payer: Self-pay | Admitting: Internal Medicine

## 2023-12-16 ENCOUNTER — Encounter: Payer: Self-pay | Admitting: Internal Medicine

## 2023-12-16 DIAGNOSIS — K219 Gastro-esophageal reflux disease without esophagitis: Secondary | ICD-10-CM

## 2023-12-16 MED ORDER — PANTOPRAZOLE SODIUM 40 MG PO TBEC: 40.0000 mg | DELAYED_RELEASE_TABLET | Freq: Every day | ORAL | 1 refills | Status: DC

## 2023-12-16 MED ORDER — PANTOPRAZOLE SODIUM 40 MG PO TBEC: 40.0000 mg | DELAYED_RELEASE_TABLET | Freq: Every day | ORAL | 0 refills | Status: DC

## 2023-12-16 NOTE — Telephone Encounter (Signed)
 90 days sent.

## 2023-12-17 ENCOUNTER — Other Ambulatory Visit: Payer: Self-pay

## 2023-12-25 ENCOUNTER — Encounter: Payer: Self-pay | Admitting: Orthopaedic Surgery

## 2024-01-08 ENCOUNTER — Encounter (HOSPITAL_COMMUNITY): Payer: Self-pay

## 2024-01-08 NOTE — Progress Notes (Signed)
 Surgical Instructions    Your procedure is scheduled on Monday, 01/20/24.  Report to [facility] Main Entrance "A" at 5:30 A.M., then check in with the Admitting office.  Call this number if you have problems the morning of surgery:  [phone]   If you have any questions prior to your surgery date call [phone]: Open Monday-Friday 8am-4pm If you experience any cold or flu symptoms such as cough, fever, chills, shortness of breath, etc. between now and your scheduled surgery, please notify us at the above number     Remember:  Do not eat after midnight the night before your surgery-Sunday.  You may drink clear liquids until 4:15 AM, the morning of your surgery.   Clear liquids allowed are: Water, Non-Citrus Juices (without pulp), Carbonated Beverages, Clear Tea, Black Coffee ONLY (NO MILK, CREAM OR POWDERED CREAMER of any kind), and Gatorade     Take these medicines the morning of surgery with A SIP OF WATER:  carvedilol (COREG)  BREO ELLIPTA Inhaler loratadine (CLARITIN)  pantoprazole (PROTONIX)  tamsulosin (FLOMAX)  venlafaxine XR (EFFEXOR-XR)    If Needed: azelastine (ASTELIN) 0.1 % nasal spray  VENTOLIN HFA Inhaler   As of today, STOP taking any Aspirin (unless otherwise instructed by your surgeon) Aleve, Naproxen, Ibuprofen, Motrin, Advil, Goody's, BC's, all herbal medications, fish oil, and all vitamins.           Do not wear jewelry or makeup. Do not wear lotions, powders, cologne or deodorant. Men may shave face and neck. Do not bring valuables to the hospital.   [facility] is not responsible for any belongings or valuables.    Do NOT Smoke (Tobacco/Vaping)  24 hours prior to your procedure  If you use a CPAP at night, you may bring your mask for your overnight stay.   Contacts, glasses, hearing aids, dentures or partials may not be worn into surgery, please bring cases for these belongings   For patients admitted to the hospital, discharge time will be  determined by your treatment team.   Patients discharged the day of surgery will not be allowed to drive home, and someone needs to stay with them for 24 hours.   SURGICAL WAITING ROOM VISITATION Patients having surgery or a procedure may have no more than 2 support people in the waiting area - these visitors may rotate.   Children under the age of 47 must have an adult with them who is not the patient. If the patient needs to stay at the hospital during part of their recovery, the visitor guidelines for inpatient rooms apply. Pre-op nurse will coordinate an appropriate time for 1 support person to accompany patient in pre-op.  This support person may not rotate.   Please refer to [email or web address] for the visitor guidelines for Inpatients (after your surgery is over and you are in a regular room).    Special instructions:    Oral Hygiene is also important to reduce your risk of infection.  Remember - BRUSH YOUR TEETH THE MORNING OF SURGERY WITH YOUR REGULAR TOOTHPASTE   [facility]- Preparing For Surgery  Before surgery, you can play an important role. Because skin is not sterile, your skin needs to be as free of germs as possible. You can reduce the number of germs on your skin by washing with CHG (chlorahexidine gluconate) Soap before surgery.  CHG is an antiseptic cleaner which kills germs and bonds with the skin to continue killing germs even after washing.     Please  do not use if you have an allergy to CHG or antibacterial soaps. If your skin becomes reddened/irritated stop using the CHG.  Do not shave (including legs and underarms) for at least 48 hours prior to first CHG shower. It is OK to shave your face.  Please follow these instructions carefully.     Shower the [employer] [facility]-Sun and the MORNING OF SURGERY-Mon with CHG Soap.   If you chose to wash your hair, wash your hair first as usual with your normal shampoo.  After you shampoo, rinse your hair and body thoroughly to remove the shampoo.  Then [employer] and genitals (private parts) with your normal soap and rinse thoroughly to remove soap.  After that Use CHG Soap as you would any other liquid soap. You can apply CHG directly to the skin and wash gently with a scrungie or a clean washcloth.   Apply the CHG Soap to your body ONLY FROM THE NECK DOWN.  Do not use on open wounds or open sores. Avoid contact with your eyes, ears, mouth and genitals (private parts). Wash Face and genitals (private parts)  with your normal soap.   Wash thoroughly, paying special attention to the area where your surgery will be performed.  Thoroughly rinse your body with warm water from the neck down.  DO NOT shower/wash with your normal soap after using and rinsing off the CHG Soap.  Pat yourself dry with a CLEAN TOWEL.  Wear CLEAN PAJAMAS to bed the night before surgery  Place CLEAN SHEETS on your bed the night before your surgery  DO NOT SLEEP WITH PETS.   Day of Surgery:  Take a shower with CHG soap. Wear Clean/Comfortable clothing the morning of surgery Do not apply any deodorants/lotions.   Remember to brush your teeth WITH YOUR REGULAR TOOTHPASTE.    If you received a COVID test during your pre-op visit, it is requested that you wear a mask when out in public, stay away from anyone that may not be feeling well, and notify your surgeon if you develop symptoms. If you have been in contact with anyone that has tested positive in the last 10 days, please notify your surgeon.    Please read over the following fact sheets that you were given.

## 2024-01-09 ENCOUNTER — Other Ambulatory Visit: Payer: Self-pay | Admitting: Physician Assistant

## 2024-01-09 ENCOUNTER — Encounter (HOSPITAL_COMMUNITY)
Admission: RE | Admit: 2024-01-09 | Discharge: 2024-01-09 | Disposition: A | Discharge status: Home / Self Care | Payer: Medicare HMO | Source: Ambulatory Visit | Attending: Orthopaedic Surgery | Admitting: Orthopaedic Surgery

## 2024-01-09 ENCOUNTER — Other Ambulatory Visit: Payer: Self-pay

## 2024-01-09 ENCOUNTER — Other Ambulatory Visit (HOSPITAL_COMMUNITY): Payer: Self-pay

## 2024-01-09 ENCOUNTER — Encounter (HOSPITAL_COMMUNITY): Payer: Self-pay

## 2024-01-09 VITALS — BP 116/67 | HR 56 | Temp 98.0°F | Resp 17 | Ht 69.0 in | Wt 213.4 lb

## 2024-01-09 DIAGNOSIS — M1712 Unilateral primary osteoarthritis, left knee: Secondary | ICD-10-CM | POA: Insufficient documentation

## 2024-01-09 DIAGNOSIS — Z01812 Encounter for preprocedural laboratory examination: Secondary | ICD-10-CM | POA: Diagnosis present

## 2024-01-09 DIAGNOSIS — Z01810 Encounter for preprocedural cardiovascular examination: Secondary | ICD-10-CM | POA: Diagnosis present

## 2024-01-09 DIAGNOSIS — Z01818 Encounter for other preprocedural examination: Secondary | ICD-10-CM | POA: Diagnosis not present

## 2024-01-09 HISTORY — DX: Inflammatory liver disease, unspecified: K75.9

## 2024-01-09 LAB — COMPREHENSIVE METABOLIC PANEL
ALT: 35 U/L (ref 0–44)
AST: 33 U/L (ref 15–41)
Albumin: 3.4 g/dL — ABNORMAL LOW (ref 3.5–5.0)
Alkaline Phosphatase: 64 U/L (ref 38–126)
Anion gap: 10 (ref 5–15)
BUN: 13 mg/dL (ref 6–20)
CO2: 25 mmol/L (ref 22–32)
Calcium: 8.7 mg/dL — ABNORMAL LOW (ref 8.9–10.3)
Chloride: 105 mmol/L (ref 98–111)
Creatinine, Ser: 0.72 mg/dL (ref 0.61–1.24)
GFR, Estimated: >60 mL/min (ref >60)
Glucose, Bld: 102 mg/dL — ABNORMAL HIGH (ref 70–99)
Potassium: 4.7 mmol/L (ref 3.5–5.1)
Sodium: 140 mmol/L (ref 135–145)
Total Bilirubin: 0.3 mg/dL (ref 0.0–1.2)
Total Protein: 6.3 g/dL — ABNORMAL LOW (ref 6.5–8.1)

## 2024-01-09 LAB — CBC
HCT: 41.3 % (ref 39.0–52.0)
Hemoglobin: 13.3 g/dL (ref 13.0–17.0)
MCH: 26.9 pg (ref 26.0–34.0)
MCHC: 32.2 g/dL (ref 30.0–36.0)
MCV: 83.4 fL (ref 80.0–100.0)
Platelets: 132 10*3/uL — ABNORMAL LOW (ref 150–400)
RBC: 4.95 MIL/uL (ref 4.22–5.81)
RDW: 18.8 % — ABNORMAL HIGH (ref 11.5–15.5)
WBC: 7.1 10*3/uL (ref 4.0–10.5)
nRBC: 0 % (ref 0.0–0.2)

## 2024-01-09 LAB — SURGICAL PCR SCREEN
MRSA, PCR: NEGATIVE
Staphylococcus aureus: NEGATIVE

## 2024-01-09 MED ORDER — APIXABAN 2.5 MG PO TABS
2.5000 mg | ORAL_TABLET | Freq: Two times a day (BID) | ORAL | 0 refills | Status: DC
  Filled 2024-01-09 – 2024-01-27 (×2): qty 60, 30d supply, fill #0

## 2024-01-09 MED ORDER — OXYCODONE-ACETAMINOPHEN 5-325 MG PO TABS: 1.0000 | ORAL_TABLET | Freq: Four times a day (QID) | ORAL | 0 refills | Status: DC | PRN

## 2024-01-09 MED ORDER — DOCUSATE SODIUM 100 MG PO CAPS
100.0000 mg | ORAL_CAPSULE | Freq: Every day | ORAL | 2 refills | Status: DC | PRN
Start: 2024-01-09 — End: 2024-06-05

## 2024-01-09 MED ORDER — ONDANSETRON HCL 4 MG PO TABS: 4.0000 mg | ORAL_TABLET | Freq: Three times a day (TID) | ORAL | 0 refills | Status: DC | PRN

## 2024-01-09 MED ORDER — DOXYCYCLINE HYCLATE 100 MG PO CAPS: 100.0000 mg | ORAL_CAPSULE | Freq: Two times a day (BID) | ORAL | 0 refills | Status: DC

## 2024-01-09 MED ORDER — METHOCARBAMOL 750 MG PO TABS: 750.0000 mg | ORAL_TABLET | Freq: Three times a day (TID) | ORAL | 1 refills | Status: DC | PRN

## 2024-01-09 NOTE — Progress Notes (Signed)
 Surgical Instructions   Your procedure is scheduled on Monday, 01/20/24. Levi Alvarez Report to [facility] Main Entrance "A" at 5:30 A.M. then check in with the Admitting office. Any questions or running late day of surgery: call [phone]  Questions prior to your surgery date: call [phone], Monday-Friday, 8am-4pm. If you experience any cold or flu symptoms such as cough, fever, chills, shortness of breath, etc. between now and your scheduled surgery, please notify us at the above number.     Remember:  Do not eat after midnight the night before your surgery  You may drink clear liquids until 4:15 the morning of your surgery.   Clear liquids allowed are: Water, Non-Citrus Juices (without pulp), Carbonated Beverages, Clear Tea (no milk, honey, etc.), Black Coffee Only (NO MILK, CREAM OR POWDERED CREAMER of any kind), and Gatorade. Patient Instructions  The night before surgery:  No food after midnight. ONLY clear liquids after midnight  The day of surgery (if you do NOT have diabetes):  Drink ONE (1) Pre-Surgery Clear Ensure by 4:15am the morning of surgery. Drink in one sitting. Do not sip.  This drink was given to you during your hospital  pre-op appointment visit.  Nothing else to drink after completing the  Pre-Surgery Clear Ensure.         If you have questions, please contact your surgeon's office.    Take these medicines the morning of surgery with A SIP OF WATER  carvedilol (COREG)  BREO ELLIPTA Inhaler loratadine (CLARITIN)  pantoprazole (PROTONIX)  tamsulosin (FLOMAX)  venlafaxine XR [facility])   May take these medicines IF NEEDED: azelastine (ASTELIN) 0.1 % nasal spray  VENTOLIN HFA Inhaler   One week prior to surgery, STOP taking any Aspirin (unless otherwise instructed by your surgeon) Aleve, Naproxen, Ibuprofen, Motrin, Advil, Goody's, BC's, all herbal medications, fish oil, and non-prescription vitamins.                     Do NOT Smoke (Tobacco/Vaping) for  24 hours prior to your procedure.  If you use a CPAP at night, you may bring your mask/headgear for your overnight stay.   You will be asked to remove any contacts, glasses, piercing's, hearing aid's, dentures/partials prior to surgery. Please bring cases for these items if needed.    Patients discharged the day of surgery will not be allowed to drive home, and someone needs to stay with them for 24 hours.  SURGICAL WAITING ROOM VISITATION Patients may have no more than 2 support people in the waiting area - these visitors may rotate.   Pre-op nurse will coordinate an appropriate time for 1 ADULT support person, who may not rotate, to accompany patient in pre-op.  Children under the age of 71 must have an adult with them who is not the patient and must remain in the main waiting area with an adult.  If the patient needs to stay at the hospital during part of their recovery, the visitor guidelines for inpatient rooms apply.  Please refer to the [facility] website for the visitor guidelines for any additional information.   If you received a COVID test during your pre-op visit  it is requested that you wear a mask when out in public, stay away from anyone that may not be feeling well and notify your surgeon if you develop symptoms. If you have been in contact with anyone that has tested positive in the last 10 days please notify you surgeon.  Pre-operative 5 CHG Bathing Instructions   You can play a key role in reducing the risk of infection after surgery. Your skin needs to be as free of germs as possible. You can reduce the number of germs on your skin by washing with CHG (chlorhexidine gluconate) soap before surgery. CHG is an antiseptic soap that kills germs and continues to kill germs even after washing.   DO NOT use if you have an allergy to chlorhexidine/CHG or antibacterial soaps. If your skin becomes reddened or irritated, stop using the CHG and notify one of our RNs at  [phone].   Please shower with the CHG soap starting 4 days before surgery using the following schedule:     Please keep in mind the following:  DO NOT shave, including legs and underarms, starting the day of your first shower.   You may shave your face at any point before/day of surgery.  Place clean sheets on your bed the day you start using CHG soap. Use a clean washcloth (not used since being washed) for each shower. DO NOT sleep with pets once you start using the CHG.   CHG Shower Instructions:  Wash your face and private area with normal soap. If you choose to wash your hair, wash first with your normal shampoo.  After you use shampoo/soap, rinse your hair and body thoroughly to remove shampoo/soap residue.  Turn the water OFF and apply about 3 tablespoons (45 ml) of CHG soap to a CLEAN washcloth.  Apply CHG soap ONLY FROM YOUR NECK DOWN TO YOUR TOES (washing for 3-5 minutes)  DO NOT use CHG soap on face, private areas, open wounds, or sores.  Pay special attention to the area where your surgery is being performed.  If you are having back surgery, having someone wash your back for you may be helpful. Wait 2 minutes after CHG soap is applied, then you may rinse off the CHG soap.  Pat dry with a clean towel  Put on clean clothes/pajamas   If you choose to wear lotion, please use ONLY the CHG-compatible lotions that are listed below.  Additional instructions for the day of surgery: DO NOT APPLY any lotions, deodorants, cologne, or perfumes.   Do not bring valuables to the hospital. [facility] is not responsible for any belongings/valuables. Do not wear nail polish, gel polish, artificial nails, or any other type of covering on natural nails (fingers and toes) Do not wear jewelry or makeup Put on clean/comfortable clothes.  Please brush your teeth.  Ask your nurse before applying any prescription medications to the skin.     CHG Compatible Lotions   Aveeno Moisturizing  lotion  Cetaphil Moisturizing Cream  Cetaphil Moisturizing Lotion  Clairol Herbal Essence Moisturizing Lotion, Dry Skin  Clairol Herbal Essence Moisturizing Lotion, Extra Dry Skin  Clairol Herbal Essence Moisturizing Lotion, Normal Skin  Curel Age Defying Therapeutic Moisturizing Lotion with Alpha Hydroxy  Curel Extreme Care Body Lotion  Curel Soothing Hands Moisturizing Hand Lotion  Curel Therapeutic Moisturizing Cream, Fragrance-Free  Curel Therapeutic Moisturizing Lotion, Fragrance-Free  Curel Therapeutic Moisturizing Lotion, Original Formula  Eucerin Daily Replenishing Lotion  Eucerin Dry Skin Therapy Plus Alpha Hydroxy Crme  Eucerin Dry Skin Therapy Plus Alpha Hydroxy Lotion  Eucerin Original Crme  Eucerin Original Lotion  Eucerin Plus Crme Eucerin Plus Lotion  Eucerin TriLipid Replenishing Lotion  Keri Anti-Bacterial Hand Lotion  Keri Deep Conditioning Original Lotion Dry Skin Formula Softly Scented  Keri Deep Conditioning Original Lotion, Fragrance Free Sensitive  Skin Formula  Keri Lotion Fast Absorbing Fragrance Free Sensitive Skin Formula  Keri Lotion Fast Absorbing Softly Scented Dry Skin Formula  Keri Original Lotion  Keri Skin Renewal Lotion Keri Silky Smooth Lotion  Keri Silky Smooth Sensitive Skin Lotion  Nivea Body Creamy Conditioning Oil  Nivea Body Extra Enriched Lotion  Nivea Body Original Lotion  Nivea Body Sheer Moisturizing Lotion Nivea Crme  Nivea Skin Firming Lotion  NutraDerm 30 Skin Lotion  NutraDerm Skin Lotion  NutraDerm Therapeutic Skin Cream  NutraDerm Therapeutic Skin Lotion  ProShield Protective Hand Cream  Provon moisturizing lotion  Please read over the following fact sheets that you were given.

## 2024-01-09 NOTE — Progress Notes (Signed)
 PCP - Loistine Chance Dixon,MD Cardiologist - denies  PPM/ICD - denies Device Orders -  Rep Notified -   Chest x-ray - na EKG - 01/09/24 Stress Test - denies ECHO - denies Cardiac Cath - denies  Sleep Study - 11/24/18 CPAP - no-pt states he doesn't need it anymore  Fasting Blood Sugar - na Checks Blood Sugar _____ times a day  Last dose of GLP1 agonist-  na GLP1 instructions:   Blood Thinner Instructions:na Aspirin Instructions:na  ERAS Protcol -clear liquids until 0415  PRE-SURGERY Ensure or G2- Ensure  COVID TEST- na   Anesthesia review: no  Patient denies shortness of breath, fever, cough and chest pain at PAT appointment   All instructions explained to the patient, with a verbal understanding of the material. Patient agrees to go over the instructions while at home for a better understanding. Patient also instructed to wear a mask when out in public prior to surgery. The opportunity to ask questions was provided.

## 2024-01-14 ENCOUNTER — Telehealth: Payer: Self-pay | Admitting: Internal Medicine

## 2024-01-14 ENCOUNTER — Encounter: Payer: Self-pay | Admitting: Orthopaedic Surgery

## 2024-01-14 NOTE — Telephone Encounter (Signed)
Surgery clearance  Noted  Copied Sleeved  Original in PCP box Copy front desk folder

## 2024-01-14 NOTE — Telephone Encounter (Signed)
 Xu, not sure about this.  Fyi, he is scheduled for total knee replacement Monday.

## 2024-01-15 NOTE — Telephone Encounter (Signed)
 Hey, we'll need to postpone the surgery until the tooth is extracted and that the infection is cleared.  Sorry about that.

## 2024-01-16 NOTE — Telephone Encounter (Signed)
 Ok,  gave debbie a heads up yesterday that this may happen

## 2024-01-20 ENCOUNTER — Ambulatory Visit (HOSPITAL_COMMUNITY): Admission: RE | Admit: 2024-01-20 | Payer: Medicare HMO | Source: Home / Self Care | Admitting: Orthopaedic Surgery

## 2024-01-20 ENCOUNTER — Encounter (HOSPITAL_COMMUNITY): Admission: RE | Payer: Self-pay | Source: Home / Self Care

## 2024-01-20 DIAGNOSIS — M1712 Unilateral primary osteoarthritis, left knee: Secondary | ICD-10-CM

## 2024-01-20 SURGERY — ARTHROPLASTY, KNEE, TOTAL
Anesthesia: Spinal | Site: Knee | Laterality: Left

## 2024-01-24 ENCOUNTER — Other Ambulatory Visit: Payer: Self-pay

## 2024-01-24 ENCOUNTER — Encounter (HOSPITAL_COMMUNITY): Payer: Self-pay | Admitting: Orthopaedic Surgery

## 2024-01-24 NOTE — Progress Notes (Addendum)
 Patient had a 01/09/24 PAT appt. for surgery on 01/20/24 and was rescheduled to Monday, 01/27/24 at 0715.  Patient's SO Lucita Lora was given information and instructions for DOS.  Arrival time 0530.    PCP - Dr Delmar Landau Cardiologist - n/a  Chest x-ray - n/a EKG - 01/09/24 Stress Test - n/a ECHO - n/a Cardiac Cath - n/a  ICD Pacemaker/Loop - n/a  Sleep Study -  Yes, 11/24/18 CPAP - does not use CPAP  Diabetes - n/a  Aspirin and Blood Thinner Instructions:  n/a  ERAS-  clear liquids til 0415 and Ensure drink completed by 0415 on DOS.  Nothing to drink after 0415 DOS.  Anesthesia review: no  STOP now taking any Aspirin (unless otherwise instructed by your surgeon), Aleve, Naproxen, Ibuprofen, Motrin, Advil, Goody's, BC's, all herbal medications, fish oil, and all vitamins.   Coronavirus Screening Does the patient have any of the following symptoms:  Cough yes/no: No Fever (>100.44F)  yes/no: No Runny nose yes/no: No Sore throat yes/no: No Difficulty breathing/shortness of breath  yes/no: No  Has the patient traveled in the last 14 days and where? yes/no: No  Patient's SO Lucita Lora verbalized understanding of instructions that were given via phone.

## 2024-01-27 ENCOUNTER — Ambulatory Visit (HOSPITAL_COMMUNITY): Payer: Self-pay | Admitting: Registered Nurse

## 2024-01-27 ENCOUNTER — Other Ambulatory Visit (HOSPITAL_COMMUNITY): Payer: Self-pay

## 2024-01-27 ENCOUNTER — Other Ambulatory Visit: Payer: Self-pay | Admitting: Physician Assistant

## 2024-01-27 ENCOUNTER — Observation Stay (HOSPITAL_COMMUNITY)

## 2024-01-27 ENCOUNTER — Encounter (HOSPITAL_COMMUNITY): Payer: Self-pay | Admitting: Orthopaedic Surgery

## 2024-01-27 ENCOUNTER — Observation Stay (HOSPITAL_COMMUNITY)
Admission: RE | Admit: 2024-01-27 | Discharge: 2024-01-28 | Disposition: A | Discharge status: Home / Self Care | Attending: Orthopaedic Surgery | Admitting: Orthopaedic Surgery

## 2024-01-27 ENCOUNTER — Other Ambulatory Visit: Payer: Self-pay

## 2024-01-27 ENCOUNTER — Encounter (HOSPITAL_COMMUNITY)
Admission: RE | Disposition: A | Discharge status: Home / Self Care | Payer: Self-pay | Source: Home / Self Care | Attending: Orthopaedic Surgery

## 2024-01-27 DIAGNOSIS — I10 Essential (primary) hypertension: Secondary | ICD-10-CM

## 2024-01-27 DIAGNOSIS — J449 Chronic obstructive pulmonary disease, unspecified: Secondary | ICD-10-CM

## 2024-01-27 DIAGNOSIS — F17210 Nicotine dependence, cigarettes, uncomplicated: Secondary | ICD-10-CM

## 2024-01-27 DIAGNOSIS — Z96652 Presence of left artificial knee joint: Secondary | ICD-10-CM

## 2024-01-27 DIAGNOSIS — M1732 Unilateral post-traumatic osteoarthritis, left knee: Secondary | ICD-10-CM | POA: Diagnosis not present

## 2024-01-27 DIAGNOSIS — Z79899 Other long term (current) drug therapy: Secondary | ICD-10-CM | POA: Diagnosis not present

## 2024-01-27 DIAGNOSIS — Z7901 Long term (current) use of anticoagulants: Secondary | ICD-10-CM | POA: Insufficient documentation

## 2024-01-27 DIAGNOSIS — M1712 Unilateral primary osteoarthritis, left knee: Principal | ICD-10-CM | POA: Insufficient documentation

## 2024-01-27 HISTORY — PX: TOTAL KNEE ARTHROPLASTY: SHX125

## 2024-01-27 SURGERY — ARTHROPLASTY, KNEE, TOTAL
Anesthesia: Spinal | Site: Knee | Laterality: Left

## 2024-01-27 MED ORDER — ALBUTEROL SULFATE (2.5 MG/3ML) 0.083% IN NEBU: 3.0000 mL | INHALATION_SOLUTION | Freq: Four times a day (QID) | RESPIRATORY_TRACT | Status: DC | PRN

## 2024-01-27 MED ORDER — HYDROMORPHONE HCL 1 MG/ML IJ SOLN: 0.5000 mg | INTRAMUSCULAR | Status: DC | PRN

## 2024-01-27 MED ORDER — DEXAMETHASONE SODIUM PHOSPHATE 10 MG/ML IJ SOLN
INTRAMUSCULAR | Status: DC | PRN
  Administered 2024-01-27: 10 mg via INTRAVENOUS

## 2024-01-27 MED ORDER — CHLORHEXIDINE GLUCONATE 0.12 % MT SOLN
15.0000 mL | Freq: Once | OROMUCOSAL | Status: AC
  Administered 2024-01-27: 15 mL via OROMUCOSAL
  Filled 2024-01-27: qty 15

## 2024-01-27 MED ORDER — KETOROLAC TROMETHAMINE 15 MG/ML IJ SOLN
15.0000 mg | Freq: Four times a day (QID) | INTRAMUSCULAR | Status: AC
  Administered 2024-01-27 – 2024-01-28 (×4): 15 mg via INTRAVENOUS
  Filled 2024-01-27 (×4): qty 1

## 2024-01-27 MED ORDER — ACETAMINOPHEN 325 MG PO TABS: 325.0000 mg | ORAL_TABLET | Freq: Four times a day (QID) | ORAL | Status: DC | PRN

## 2024-01-27 MED ORDER — MEPERIDINE HCL 25 MG/ML IJ SOLN: 6.2500 mg | INTRAMUSCULAR | Status: DC | PRN

## 2024-01-27 MED ORDER — ACETAMINOPHEN 10 MG/ML IV SOLN: 1000.0000 mg | Freq: Once | INTRAVENOUS | Status: DC | PRN

## 2024-01-27 MED ORDER — TRAZODONE HCL 50 MG PO TABS
50.0000 mg | ORAL_TABLET | Freq: Every day | ORAL | Status: DC
Start: 2024-01-27 — End: 2024-01-28
  Administered 2024-01-27: 50 mg via ORAL
  Filled 2024-01-27: qty 1

## 2024-01-27 MED ORDER — ONDANSETRON HCL 4 MG PO TABS: 4.0000 mg | ORAL_TABLET | Freq: Four times a day (QID) | ORAL | Status: DC | PRN

## 2024-01-27 MED ORDER — LACTATED RINGERS IV SOLN: INTRAVENOUS | Status: DC

## 2024-01-27 MED ORDER — FENTANYL CITRATE (PF) 250 MCG/5ML IJ SOLN: INTRAMUSCULAR | Status: DC | PRN

## 2024-01-27 MED ORDER — TRANEXAMIC ACID-NACL 1000-0.7 MG/100ML-% IV SOLN
INTRAVENOUS | Status: AC
  Filled 2024-01-27: qty 100

## 2024-01-27 MED ORDER — EPHEDRINE SULFATE-NACL 50-0.9 MG/10ML-% IV SOSY
PREFILLED_SYRINGE | INTRAVENOUS | Status: DC | PRN
  Administered 2024-01-27 (×2): 5 mg via INTRAVENOUS

## 2024-01-27 MED ORDER — ACETAMINOPHEN 325 MG PO TABS: 325.0000 mg | ORAL_TABLET | Freq: Once | ORAL | Status: DC | PRN

## 2024-01-27 MED ORDER — ONDANSETRON HCL 4 MG/2ML IJ SOLN
INTRAMUSCULAR | Status: AC
  Filled 2024-01-27: qty 2

## 2024-01-27 MED ORDER — APIXABAN 2.5 MG PO TABS
2.5000 mg | ORAL_TABLET | Freq: Two times a day (BID) | ORAL | Status: DC
  Administered 2024-01-28: 2.5 mg via ORAL
  Filled 2024-01-27: qty 1

## 2024-01-27 MED ORDER — OXYCODONE HCL 5 MG PO TABS
5.0000 mg | ORAL_TABLET | ORAL | Status: DC | PRN
  Administered 2024-01-27 – 2024-01-28 (×4): 10 mg via ORAL
  Filled 2024-01-27 (×5): qty 2

## 2024-01-27 MED ORDER — MIDAZOLAM HCL 2 MG/2ML IJ SOLN
INTRAMUSCULAR | Status: DC | PRN
  Administered 2024-01-27: 2 mg via INTRAVENOUS

## 2024-01-27 MED ORDER — ONDANSETRON HCL 4 MG/2ML IJ SOLN: 4.0000 mg | Freq: Four times a day (QID) | INTRAMUSCULAR | Status: DC | PRN

## 2024-01-27 MED ORDER — ROPIVACAINE HCL 5 MG/ML IJ SOLN
INTRAMUSCULAR | Status: DC | PRN
  Administered 2024-01-27: 30 mL via PERINEURAL

## 2024-01-27 MED ORDER — PHENOL 1.4 % MT LIQD: 1.0000 | OROMUCOSAL | Status: DC | PRN

## 2024-01-27 MED ORDER — METHOCARBAMOL 1000 MG/10ML IJ SOLN: 500.0000 mg | Freq: Four times a day (QID) | INTRAMUSCULAR | Status: DC | PRN

## 2024-01-27 MED ORDER — DEXAMETHASONE SODIUM PHOSPHATE 10 MG/ML IJ SOLN
10.0000 mg | Freq: Once | INTRAMUSCULAR | Status: AC
  Administered 2024-01-28: 10 mg via INTRAVENOUS
  Filled 2024-01-27: qty 1

## 2024-01-27 MED ORDER — BUPIVACAINE IN DEXTROSE 0.75-8.25 % IT SOLN
INTRATHECAL | Status: DC | PRN
  Administered 2024-01-27: 1.8 mL via INTRATHECAL

## 2024-01-27 MED ORDER — FENTANYL CITRATE (PF) 250 MCG/5ML IJ SOLN
INTRAMUSCULAR | Status: DC | PRN
  Administered 2024-01-27: 50 ug via INTRAVENOUS

## 2024-01-27 MED ORDER — METHOCARBAMOL 500 MG PO TABS
500.0000 mg | ORAL_TABLET | Freq: Four times a day (QID) | ORAL | Status: DC | PRN
  Administered 2024-01-27 – 2024-01-28 (×3): 500 mg via ORAL
  Filled 2024-01-27 (×3): qty 1

## 2024-01-27 MED ORDER — ACETAMINOPHEN 500 MG PO TABS
1000.0000 mg | ORAL_TABLET | Freq: Four times a day (QID) | ORAL | Status: AC
  Administered 2024-01-27 – 2024-01-28 (×4): 1000 mg via ORAL
  Filled 2024-01-27 (×4): qty 2

## 2024-01-27 MED ORDER — 0.9 % SODIUM CHLORIDE (POUR BTL) OPTIME
TOPICAL | Status: DC | PRN
  Administered 2024-01-27: 1000 mL

## 2024-01-27 MED ORDER — PROPOFOL 500 MG/50ML IV EMUL
INTRAVENOUS | Status: DC | PRN
  Administered 2024-01-27: 50 ug/kg/min via INTRAVENOUS

## 2024-01-27 MED ORDER — VANCOMYCIN HCL 1000 MG IV SOLR
INTRAVENOUS | Status: AC
Start: 2024-01-27 — End: ?
  Filled 2024-01-27: qty 20

## 2024-01-27 MED ORDER — ACETAMINOPHEN 160 MG/5ML PO SOLN: 325.0000 mg | Freq: Once | ORAL | Status: DC | PRN

## 2024-01-27 MED ORDER — DOXYCYCLINE HYCLATE 100 MG PO TABS
100.0000 mg | ORAL_TABLET | Freq: Two times a day (BID) | ORAL | Status: DC
  Administered 2024-01-27 – 2024-01-28 (×3): 100 mg via ORAL
  Filled 2024-01-27 (×3): qty 1

## 2024-01-27 MED ORDER — OXYCODONE HCL ER 10 MG PO T12A
10.0000 mg | EXTENDED_RELEASE_TABLET | Freq: Two times a day (BID) | ORAL | Status: DC
  Administered 2024-01-27 – 2024-01-28 (×3): 10 mg via ORAL
  Filled 2024-01-27 (×3): qty 1

## 2024-01-27 MED ORDER — MELATONIN 5 MG PO TABS
5.0000 mg | ORAL_TABLET | Freq: Once | ORAL | Status: AC
  Administered 2024-01-27: 5 mg via ORAL
  Filled 2024-01-27: qty 1

## 2024-01-27 MED ORDER — OXYCODONE HCL 5 MG PO TABS: 10.0000 mg | ORAL_TABLET | ORAL | Status: DC | PRN

## 2024-01-27 MED ORDER — DROPERIDOL 2.5 MG/ML IJ SOLN: 0.6250 mg | Freq: Once | INTRAMUSCULAR | Status: DC | PRN

## 2024-01-27 MED ORDER — BUPIVACAINE-MELOXICAM ER 400-12 MG/14ML IJ SOLN
INTRAMUSCULAR | Status: DC | PRN
  Administered 2024-01-27: 400 mg

## 2024-01-27 MED ORDER — CARVEDILOL 6.25 MG PO TABS
6.2500 mg | ORAL_TABLET | Freq: Two times a day (BID) | ORAL | Status: DC
  Administered 2024-01-27 – 2024-01-28 (×2): 6.25 mg via ORAL
  Filled 2024-01-27 (×2): qty 1

## 2024-01-27 MED ORDER — ORAL CARE MOUTH RINSE: 15.0000 mL | Freq: Once | OROMUCOSAL | Status: AC

## 2024-01-27 MED ORDER — MIDAZOLAM HCL 2 MG/2ML IJ SOLN
INTRAMUSCULAR | Status: AC
  Filled 2024-01-27: qty 2

## 2024-01-27 MED ORDER — HYDROMORPHONE HCL 1 MG/ML IJ SOLN: 0.2500 mg | INTRAMUSCULAR | Status: DC | PRN

## 2024-01-27 MED ORDER — PHENYLEPHRINE HCL-NACL 20-0.9 MG/250ML-% IV SOLN
INTRAVENOUS | Status: DC | PRN
  Administered 2024-01-27: 25 ug/min via INTRAVENOUS

## 2024-01-27 MED ORDER — SODIUM CHLORIDE 0.9 % IR SOLN
Status: DC | PRN
  Administered 2024-01-27: 1000 mL

## 2024-01-27 MED ORDER — TRANEXAMIC ACID-NACL 1000-0.7 MG/100ML-% IV SOLN
1000.0000 mg | Freq: Once | INTRAVENOUS | Status: AC
  Administered 2024-01-27: 1000 mg via INTRAVENOUS
  Filled 2024-01-27: qty 100

## 2024-01-27 MED ORDER — PRONTOSAN WOUND IRRIGATION OPTIME
TOPICAL | Status: DC | PRN
  Administered 2024-01-27: 350 via TOPICAL

## 2024-01-27 MED ORDER — ONDANSETRON HCL 4 MG/2ML IJ SOLN
INTRAMUSCULAR | Status: DC | PRN
  Administered 2024-01-27: 4 mg via INTRAVENOUS

## 2024-01-27 MED ORDER — CEFAZOLIN SODIUM-DEXTROSE 2-4 GM/100ML-% IV SOLN
INTRAVENOUS | Status: AC
  Filled 2024-01-27: qty 100

## 2024-01-27 MED ORDER — VANCOMYCIN HCL 1000 MG IV SOLR
INTRAVENOUS | Status: DC | PRN
  Administered 2024-01-27: 1000 mg

## 2024-01-27 MED ORDER — DEXAMETHASONE SODIUM PHOSPHATE 10 MG/ML IJ SOLN
INTRAMUSCULAR | Status: AC
  Filled 2024-01-27: qty 1

## 2024-01-27 MED ORDER — DOCUSATE SODIUM 100 MG PO CAPS
100.0000 mg | ORAL_CAPSULE | Freq: Two times a day (BID) | ORAL | Status: DC
  Administered 2024-01-27 – 2024-01-28 (×3): 100 mg via ORAL
  Filled 2024-01-27 (×3): qty 1

## 2024-01-27 MED ORDER — FENTANYL CITRATE (PF) 250 MCG/5ML IJ SOLN
INTRAMUSCULAR | Status: AC
  Filled 2024-01-27: qty 5

## 2024-01-27 MED ORDER — VENLAFAXINE HCL ER 75 MG PO CP24
75.0000 mg | ORAL_CAPSULE | Freq: Every day | ORAL | Status: DC
  Administered 2024-01-28: 75 mg via ORAL
  Filled 2024-01-27: qty 1

## 2024-01-27 MED ORDER — TRANEXAMIC ACID 1000 MG/10ML IV SOLN
2000.0000 mg | Freq: Once | INTRAVENOUS | Status: AC
  Administered 2024-01-27: 2000 mg via TOPICAL
  Filled 2024-01-27: qty 20

## 2024-01-27 MED ORDER — METOCLOPRAMIDE HCL 5 MG/ML IJ SOLN: 5.0000 mg | Freq: Three times a day (TID) | INTRAMUSCULAR | Status: DC | PRN

## 2024-01-27 MED ORDER — MENTHOL 3 MG MT LOZG: 1.0000 | LOZENGE | OROMUCOSAL | Status: DC | PRN

## 2024-01-27 MED ORDER — EPHEDRINE 5 MG/ML INJ
INTRAVENOUS | Status: AC
  Filled 2024-01-27: qty 5

## 2024-01-27 MED ORDER — TAMSULOSIN HCL 0.4 MG PO CAPS
0.4000 mg | ORAL_CAPSULE | Freq: Every day | ORAL | Status: DC
  Administered 2024-01-28: 0.4 mg via ORAL
  Filled 2024-01-27: qty 1

## 2024-01-27 MED ORDER — BUPIVACAINE-MELOXICAM ER 400-12 MG/14ML IJ SOLN
INTRAMUSCULAR | Status: AC
Start: 2024-01-27 — End: ?
  Filled 2024-01-27: qty 1

## 2024-01-27 MED ORDER — CEFAZOLIN SODIUM-DEXTROSE 2-4 GM/100ML-% IV SOLN
2.0000 g | Freq: Four times a day (QID) | INTRAVENOUS | Status: AC
  Administered 2024-01-27 (×2): 2 g via INTRAVENOUS
  Filled 2024-01-27 (×2): qty 100

## 2024-01-27 MED ORDER — CEFAZOLIN SODIUM-DEXTROSE 2-3 GM-%(50ML) IV SOLR
INTRAVENOUS | Status: DC | PRN
Start: 2024-01-27 — End: 2024-01-27
  Administered 2024-01-27: 2 g via INTRAVENOUS

## 2024-01-27 MED ORDER — SENNA 8.6 MG PO TABS
1.0000 | ORAL_TABLET | Freq: Two times a day (BID) | ORAL | Status: DC | PRN
  Administered 2024-01-27: 8.6 mg via ORAL
  Filled 2024-01-27: qty 1

## 2024-01-27 MED ORDER — TRANEXAMIC ACID-NACL 1000-0.7 MG/100ML-% IV SOLN
INTRAVENOUS | Status: DC | PRN
  Administered 2024-01-27: 1000 mg via INTRAVENOUS

## 2024-01-27 MED ORDER — METOCLOPRAMIDE HCL 5 MG PO TABS: 5.0000 mg | ORAL_TABLET | Freq: Three times a day (TID) | ORAL | Status: DC | PRN

## 2024-01-27 SURGICAL SUPPLY — 75 items
ALCOHOL 70% 16 OZ (MISCELLANEOUS) ×1 IMPLANT
BAG COUNTER SPONGE SURGICOUNT (BAG) IMPLANT
BAG DECANTER FOR FLEXI CONT (MISCELLANEOUS) ×1 IMPLANT
BANDAGE ESMARK 6X9 LF (GAUZE/BANDAGES/DRESSINGS) IMPLANT
BLADE SAG 18X100X1.27 (BLADE) ×1 IMPLANT
BLADE SAW SGTL 73X25 THK (BLADE) ×1 IMPLANT
BNDG ESMARK 6X9 LF (GAUZE/BANDAGES/DRESSINGS) IMPLANT
BOWL SMART MIX CTS (DISPOSABLE) ×1 IMPLANT
CEMENT BONE REFOBACIN R1X40 US (Cement) IMPLANT
CLSR STERI-STRIP ANTIMIC 1/2X4 (GAUZE/BANDAGES/DRESSINGS) ×2 IMPLANT
COOLER ICEMAN CLASSIC (MISCELLANEOUS) ×1 IMPLANT
COVER SURGICAL LIGHT HANDLE (MISCELLANEOUS) ×1 IMPLANT
CUFF TOURN SGL QUICK 42 (TOURNIQUET CUFF) IMPLANT
CUFF TRNQT CYL 34X4.125X (TOURNIQUET CUFF) ×1 IMPLANT
DERMABOND ADVANCED .7 DNX12 (GAUZE/BANDAGES/DRESSINGS) ×1 IMPLANT
DRAPE EXTREMITY T 121X128X90 (DISPOSABLE) ×1 IMPLANT
DRAPE HALF SHEET 40X57 (DRAPES) ×1 IMPLANT
DRAPE INCISE IOBAN 66X45 STRL (DRAPES) ×1 IMPLANT
DRAPE POUCH INSTRU U-SHP 10X18 (DRAPES) ×1 IMPLANT
DRAPE SURG ORHT 6 SPLT 77X108 (DRAPES) IMPLANT
DRAPE U-SHAPE 47X51 STRL (DRAPES) ×2 IMPLANT
DRSG AQUACEL AG ADV 3.5X10 (GAUZE/BANDAGES/DRESSINGS) ×1 IMPLANT
DURAPREP 26ML APPLICATOR (WOUND CARE) ×3 IMPLANT
ELECT CAUTERY BLADE 6.4 (BLADE) ×1 IMPLANT
ELECT PENCIL ROCKER SW 15FT (MISCELLANEOUS) ×1 IMPLANT
ELECT REM PT RETURN 9FT ADLT (ELECTROSURGICAL) ×1 IMPLANT
ELECTRODE REM PT RTRN 9FT ADLT (ELECTROSURGICAL) ×1 IMPLANT
GLOVE BIOGEL PI IND STRL 7.0 (GLOVE) ×2 IMPLANT
GLOVE BIOGEL PI IND STRL 7.5 (GLOVE) ×5 IMPLANT
GLOVE ECLIPSE 7.0 STRL STRAW (GLOVE) ×3 IMPLANT
GLOVE INDICATOR 7.0 STRL GRN (GLOVE) ×1 IMPLANT
GLOVE INDICATOR 7.5 STRL GRN (GLOVE) ×1 IMPLANT
GLOVE SURG SYN 7.5 E (GLOVE) ×2 IMPLANT
GLOVE SURG SYN 7.5 PF PI (GLOVE) ×2 IMPLANT
GLOVE SURG UNDER LTX SZ7.5 (GLOVE) ×2 IMPLANT
GLOVE SURG UNDER POLY LF SZ7 (GLOVE) ×2 IMPLANT
GOWN STRL REUS W/ TWL LRG LVL3 (GOWN DISPOSABLE) ×1 IMPLANT
GOWN STRL SURGICAL XL XLNG (GOWN DISPOSABLE) ×1 IMPLANT
GOWN TOGA ZIPPER T7+ PEEL AWAY (MISCELLANEOUS) ×2 IMPLANT
HOOD PEEL AWAY T7 (MISCELLANEOUS) ×1 IMPLANT
KIT BASIN OR (CUSTOM PROCEDURE TRAY) ×1 IMPLANT
KIT TURNOVER KIT B (KITS) ×1 IMPLANT
MANIFOLD NEPTUNE II (INSTRUMENTS) ×1 IMPLANT
MARKER SKIN DUAL TIP RULER LAB (MISCELLANEOUS) ×2 IMPLANT
NDL SPNL 18GX3.5 QUINCKE PK (NEEDLE) ×1 IMPLANT
NEEDLE SPNL 18GX3.5 QUINCKE PK (NEEDLE) ×1 IMPLANT
NS IRRIG 1000ML POUR BTL (IV SOLUTION) ×1 IMPLANT
PACK TOTAL JOINT (CUSTOM PROCEDURE TRAY) ×1 IMPLANT
PAD ARMBOARD POSITIONER FOAM (MISCELLANEOUS) ×2 IMPLANT
PAD COLD SHLDR WRAP-ON (PAD) ×1 IMPLANT
PIN DRILL HDLS TROCAR 75 4PK (PIN) IMPLANT
PSN FEM CR CMT CCR STD SZ10 L (Joint) ×1 IMPLANT
SCREW FEMALE HEX FIX 25X2.5 (ORTHOPEDIC DISPOSABLE SUPPLIES) IMPLANT
SET HNDPC FAN SPRY TIP SCT (DISPOSABLE) ×1 IMPLANT
SOLUTION PRONTOSAN WOUND 350ML (IRRIGATION / IRRIGATOR) ×1 IMPLANT
STAPLER VISISTAT 35W (STAPLE) IMPLANT
STEM POLY PAT PLY 35M KNEE (Knees) IMPLANT
STEM TIBIA 5 DEG SZ E L KNEE (Knees) IMPLANT
STEM TIBIAL 10 8-11 EF POLY LT (Joint) IMPLANT
SUCTION TUBE FRAZIER 10FR DISP (SUCTIONS) ×1 IMPLANT
SURFACE ARTC PRSNA CCR SZ10 L (Joint) IMPLANT
SUT ETHILON 2 0 FS 18 (SUTURE) IMPLANT
SUT MNCRL AB 3-0 PS2 27 (SUTURE) IMPLANT
SUT STRATAFIX 1PDS 45CM VIOLET (SUTURE) IMPLANT
SUT VIC AB 0 CT1 27XBRD ANBCTR (SUTURE) ×2 IMPLANT
SUT VIC AB 1 CTX 27 (SUTURE) ×3 IMPLANT
SUT VIC AB 2-0 CT1 TAPERPNT 27 (SUTURE) ×4 IMPLANT
SYR 50ML LL SCALE MARK (SYRINGE) ×2 IMPLANT
TIBIA STEM 5 DEG SZ E L KNEE (Knees) ×1 IMPLANT
TOWEL GREEN STERILE (TOWEL DISPOSABLE) ×1 IMPLANT
TOWEL GREEN STERILE FF (TOWEL DISPOSABLE) ×1 IMPLANT
TRAY CATH INTERMITTENT SS 16FR (CATHETERS) IMPLANT
TUBE SUCT ARGYLE STRL (TUBING) ×1 IMPLANT
UNDERPAD 30X36 HEAVY ABSORB (UNDERPADS AND DIAPERS) ×1 IMPLANT
YANKAUER SUCT BULB TIP NO VENT (SUCTIONS) ×2 IMPLANT

## 2024-01-27 NOTE — Anesthesia Procedure Notes (Signed)
 Anesthesia Regional Block: Adductor canal block   Pre-Anesthetic Checklist: , timeout performed,  Correct Patient, Correct Site, Correct Laterality,  Correct Procedure, Correct Position, site marked,  Risks and benefits discussed,  Surgical consent,  Pre-op evaluation,  At surgeon's request and post-op pain management  Laterality: Left  Prep: chloraprep       Needles:  Injection technique: Single-shot  Needle Type: Echogenic Stimulator Needle     Needle Length: 9cm  Needle Gauge: 21     Additional Needles:   Procedures:,,,, ultrasound used (permanent image in chart),,    Narrative:  Start time: 01/27/2024 7:05 AM End time: 01/27/2024 7:10 AM Injection made incrementally with aspirations every 5 mL.  Performed by: Personally  Anesthesiologist: Shelton Silvas, MD  Additional Notes: Discussed risks and benefits of the nerve block in detail, including but not limited vascular injury, permanent nerve damage and infection.   Patient tolerated the procedure well. Local anesthetic introduced in an incremental fashion under minimal resistance after negative aspirations. No paresthesias were elicited. After completion of the procedure, no acute issues were identified and patient continued to be monitored by RN.

## 2024-01-27 NOTE — Anesthesia Preprocedure Evaluation (Addendum)
 Anesthesia Evaluation  Patient identified by MRN, date of birth, ID band Patient awake    Reviewed: Allergy & Precautions, Patient's Chart, lab work & pertinent test results  Airway Mallampati: II  TM Distance: >3 FB Neck ROM: Full    Dental  (+) Missing, Poor Dentition   Pulmonary sleep apnea , COPD, Current Smoker and Patient abstained from smoking.   breath sounds clear to auscultation       Cardiovascular hypertension,  Rhythm:Regular Rate:Normal  Echo:   1. Left ventricular ejection fraction, by estimation, is 60 to 65%. The  left ventricle has normal function. The left ventricle has no regional  wall motion abnormalities. Left ventricular diastolic parameters were  normal.   2. Right ventricular systolic function is normal. The right ventricular  size is normal.   3. The mitral valve is normal in structure. No evidence of mitral valve  regurgitation. No evidence of mitral stenosis.   4. The aortic valve is tricuspid. Aortic valve regurgitation is not  visualized. No aortic stenosis is present.   5. The inferior vena cava is normal in size with greater than 50%  respiratory variability, suggesting right atrial pressure of 3 mmHg.     Neuro/Psych  PSYCHIATRIC DISORDERS  Depression    negative neurological ROS     GI/Hepatic ,GERD  ,,(+) Cirrhosis   Esophageal Varices    , Hepatitis -, C  Endo/Other  negative endocrine ROS    Renal/GU negative Renal ROS     Musculoskeletal  (+) Arthritis ,    Abdominal   Peds  Hematology  (+) Blood dyscrasia, anemia   Anesthesia Other Findings   Reproductive/Obstetrics                             Anesthesia Physical Anesthesia Plan  ASA: 3  Anesthesia Plan: Spinal   Post-op Pain Management: Regional block*   Induction: Intravenous  PONV Risk Score and Plan: 1 and Ondansetron, Dexamethasone, Propofol infusion and Midazolam  Airway  Management Planned: Natural Airway and Nasal Cannula  Additional Equipment: None  Intra-op Plan:   Post-operative Plan:   Informed Consent: I have reviewed the patients History and Physical, chart, labs and discussed the procedure including the risks, benefits and alternatives for the proposed anesthesia with the patient or authorized representative who has indicated his/her understanding and acceptance.       Plan Discussed with: CRNA  Anesthesia Plan Comments: (Lab Results      Component                Value               Date                      WBC                      7.1                 01/09/2024                HGB                      13.3                01/09/2024                HCT  41.3                01/09/2024                MCV                      83.4                01/09/2024                PLT                      132 (L)             01/09/2024           )       Anesthesia Quick Evaluation

## 2024-01-27 NOTE — H&P (Signed)
 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 male who presents for surgical treatment of left knee post traumatic arthritis.  He denies any changes in medical history.  Past Surgical History:  Procedure Laterality Date  . arm surgery     left arm,  . COLONOSCOPY WITH PROPOFOL N/A 01/19/2019   Procedure: COLONOSCOPY WITH PROPOFOL;  Surgeon: Benancio Deeds, MD;  Location: [facility];  Service: Gastroenterology;  Laterality: N/A;  . ESOPHAGEAL BANDING  11/25/2018   Procedure: ESOPHAGEAL BANDING;  Surgeon: Rachael Fee, MD;  Location: [facility];  Service: Endoscopy;;  . ESOPHAGEAL BANDING  01/19/2019   Procedure: ESOPHAGEAL BANDING;  Surgeon: Benancio Deeds, MD;  Location: [facility];  Service: Gastroenterology;;  . ESOPHAGEAL BANDING N/A 01/24/2021   Procedure: ESOPHAGEAL BANDING;  Surgeon: Benancio Deeds, MD;  Location: [facility];  Service: Gastroenterology;  Laterality: N/A;  . ESOPHAGOGASTRODUODENOSCOPY (EGD) WITH PROPOFOL N/A 11/25/2018   Procedure: ESOPHAGOGASTRODUODENOSCOPY (EGD) WITH PROPOFOL;  Surgeon: Rachael Fee, MD;  Location: [facility];  Service: Endoscopy;  Laterality: N/A;  . ESOPHAGOGASTRODUODENOSCOPY (EGD) WITH PROPOFOL N/A 01/19/2019   Procedure: ESOPHAGOGASTRODUODENOSCOPY (EGD) WITH PROPOFOL;  Surgeon: Benancio Deeds, MD;  Location: [facility];  Service: Gastroenterology;  Laterality: N/A;  . ESOPHAGOGASTRODUODENOSCOPY (EGD) WITH PROPOFOL N/A 04/28/2019   Procedure: ESOPHAGOGASTRODUODENOSCOPY (EGD) WITH PROPOFOL;  Surgeon: Benancio Deeds, MD;  Location: [facility];  Service: Gastroenterology;  Laterality: N/A;  . ESOPHAGOGASTRODUODENOSCOPY (EGD) WITH PROPOFOL N/A 11/03/2019   Procedure: ESOPHAGOGASTRODUODENOSCOPY (EGD) WITH PROPOFOL;  Surgeon: Sherrilyn Rist, MD;  Location: [facility];  Service: Gastroenterology;  Laterality: N/A;  . ESOPHAGOGASTRODUODENOSCOPY  (EGD) WITH PROPOFOL N/A 01/24/2021   Procedure: ESOPHAGOGASTRODUODENOSCOPY (EGD) WITH PROPOFOL;  Surgeon: Benancio Deeds, MD;  Location: [facility];  Service: Gastroenterology;  Laterality: N/A;  . KNEE SURGERY Left   . POLYPECTOMY  01/19/2019   Procedure: POLYPECTOMY;  Surgeon: Benancio Deeds, MD;  Location: [facility];  Service: Gastroenterology;;   Social History   Socioeconomic History  . Marital status: Divorced    Spouse name: Not on file  . Number of children: 2  . Years of education: Not on file  . Highest education level: GED or equivalent  Occupational History  . Occupation: disabled  Tobacco Use  . Smoking status: Every Day    Current packs/day: 0.50    Average packs/day: 0.5 packs/day for 30.0 years (15.0 ttl pk-yrs)    Types: Cigarettes    Passive exposure: Current  . Smokeless tobacco: Former    Types: Engineer, drilling  . Vaping status: Never Used  Substance and Sexual Activity  . Alcohol use: Not Currently    Alcohol/week: 36.0 standard drinks of alcohol    Types: 36 Cans of beer per week    Comment: quit March 2023  . Drug use: Yes    Types: Marijuana    Comment: daily use - informed to withhold 24-48 hrs prior to procedure  . Sexual activity: Yes    Partners: Female  Other Topics Concern  . Not on file  Social History Narrative  . Not on file   Social Drivers of Health   Financial Resource Strain: Low Risk  (12/03/2023)   Overall Financial Resource Strain (CARDIA)   . Difficulty of Paying Living Expenses: Not hard at all  Food Insecurity: No Food Insecurity (12/03/2023)   Hunger Vital Sign   . Worried About [employer] of  Food in the Last Year: Never true   . Ran Out of Food in the Last Year: Never true  Transportation Needs: No Transportation Needs (12/03/2023)   PRAPARE - Transportation   . Lack of Transportation (Medical): No   . Lack of Transportation (Non-Medical): No  Physical Activity: Unknown (12/03/2023)   Exercise Vital Sign    . Days of Exercise per Week: 0 days   . Minutes of Exercise per Session: Not on file  Stress: No Stress Concern Present (12/03/2023)   [employer] of Occupational Health - Occupational Stress Questionnaire   . Feeling of Stress : Only a little  Social Connections: Moderately Isolated (12/03/2023)   Social Connection and Isolation Panel [NHANES]   . Frequency of Communication with Friends and Family: Three times a week   . Frequency of Social Gatherings with Friends and Family: Three times a week   . Attends Religious Services: Never   . Active Member of Clubs or Organizations: No   . Attends Banker Meetings: Not on file   . Marital Status: Living with partner   Family History  Problem Relation Age of Onset  . Colitis Mother   . Arthritis Mother   . Dementia Mother   . Cancer Father        lymphoma?  . Cancer Paternal Grandfather        bone  . Heart disease Neg Hx   . Stroke Neg Hx   . Diabetes Neg Hx   . Colon cancer Neg Hx   . Esophageal cancer Neg Hx   . Stomach cancer Neg Hx   . Rectal cancer Neg Hx    Allergies  Allergen Reactions  . Onion Swelling    Raw onions   . Gabapentin Other (See Comments)    Restless legs  . Naproxen Hives and Swelling   Prior to Admission medications   Medication Sig Start Date End Date Taking? Authorizing Provider  doxycycline (VIBRAMYCIN) 100 MG capsule Take 1 capsule (100 mg total) by mouth 2 (two) times daily. To be taken after surgery 01/09/24   Cristie Hem, PA-C  apixaban (ELIQUIS) 2.5 MG TABS tablet Take 1 tablet (2.5 mg total) by mouth 2 (two) times daily. To be taken after surgery to prevent blood clots 01/09/24   Cristie Hem, PA-C  carvedilol (COREG) 6.25 MG tablet Take 1 tablet (6.25 mg total) by mouth 2 (two) times daily with a meal. 06/18/22   Edsel Petrin, DO  docusate sodium (COLACE) 100 MG capsule Take 1 capsule (100 mg total) by mouth daily as needed. 01/09/24 01/08/25  Cristie Hem, PA-C   folic acid (FOLVITE) 1 MG tablet Take 1 mg by mouth in the morning.    [provider]  loratadine (CLARITIN) 10 MG tablet Take 10 mg by mouth daily.    [provider]  Magnesium 250 MG CAPS Take 250 mg by mouth daily.    [provider]  methocarbamol (ROBAXIN-750) 750 MG tablet Take 1 tablet (750 mg total) by mouth 3 (three) times daily as needed for muscle spasms. 01/09/24   Cristie Hem, PA-C  MILK THISTLE PO Take 1 capsule by mouth 2 (two) times daily.    [provider]  Multiple Vitamin (MULTIVITAMIN WITH MINERALS) TABS tablet Take 1 tablet by mouth daily with breakfast.    [provider]  ondansetron (ZOFRAN) 4 MG tablet Take 1 tablet (4 mg total) by mouth every 8 (eight) hours as needed  for nausea or vomiting. 01/09/24   Cristie Hem, PA-C  oxyCODONE-acetaminophen (PERCOCET) 5-325 MG tablet Take 1-2 tablets by mouth every 6 (six) hours as needed. To be taken after surgery 01/09/24   Cristie Hem, PA-C  pantoprazole (PROTONIX) 40 MG tablet Take 1 tablet (40 mg total) by mouth daily. 12/16/23   Billie Lade, MD  sildenafil (VIAGRA) 100 MG tablet 1/2-1 tab po prn 06/24/23   Marcine Matar, MD  tamsulosin (FLOMAX) 0.4 MG CAPS capsule Take 1 capsule (0.4 mg total) by mouth daily. 05/18/22   Vaillancourt, Lelon Mast, PA-C  testosterone cypionate (DEPOTESTOSTERONE CYPIONATE) 200 MG/ML injection Inject 100 mg into the skin once a week. 11/02/23   [provider]  Thiamine HCl (VITAMIN B1) 100 MG TABS Take 1 tablet (100 mg total) by mouth in the morning. 01/29/22   Edsel Petrin, DO  traZODone (DESYREL) 50 MG tablet Take 1 tablet (50 mg total) by mouth at bedtime. For sleep 05/31/22   Edsel Petrin, DO  venlafaxine XR (EFFEXOR-XR) 75 MG 24 hr capsule TAKE 1 CAPSULE BY MOUTH DAILY. 08/15/20   Hazel Sams, NP  VENTOLIN HFA 108 (90 Base) MCG/ACT inhaler INHALE 1 PUFF EVERY 6 (SIX) HOURS AS NEEDED INTO THE LUNGS FOR  WHEEZING OR SHORTNESS OF BREATH. 05/19/20   Hoy Register, MD     Positive ROS: All other systems have been reviewed and were otherwise negative with the exception of those mentioned in the HPI and as above.  Physical Exam: General: Alert, no acute distress Cardiovascular: No pedal edema Respiratory: No cyanosis, no use of accessory musculature GI: abdomen soft Skin: No lesions in the area of chief complaint Neurologic: Sensation intact distally Psychiatric: Patient is competent for consent with normal mood and affect Lymphatic: no lymphedema  MUSCULOSKELETAL: exam stable  Assessment: left knee post traumatic arthritis  Plan: Plan for Procedure(s): LEFT TOTAL KNEE ARTHROPLASTY  The risks benefits and alternatives were discussed with the patient including but not limited to the risks of nonoperative treatment, versus surgical intervention including infection, bleeding, nerve injury,  blood clots, cardiopulmonary complications, morbidity, mortality, among others, and they were willing to proceed.   Glee Arvin, MD 01/27/2024 5:51 AM

## 2024-01-27 NOTE — Evaluation (Signed)
 Physical Therapy Evaluation  Patient Details Name: Levi Alvarez MRN: [id number] DOB: 08/07/1971 Today's Date: 01/27/2024  History of Present Illness  Pt is a 53 y/o male who presents s/p L TKA on 01/27/2024. PMH significant for COPD, DDD cervical, diverticulosis, GIB, hepatitis, HTN, L inguinal hernia, umbilical hernia.  Clinical Impression  Pt is s/p TKA resulting in the deficits listed below (see PT Problem List). At the time of PT eval pt was motivated to participate and was able to complete ~200' ambulation in the hall. Occasional knee buckling noted however no physical assist required to recover. Pt anticipates d/c home tomorrow. Pt will benefit from acute skilled PT to increase their independence and safety with mobility to allow discharge.          If plan is discharge home, recommend the following: A little help with walking and/or transfers;A little help with bathing/dressing/bathroom;Assistance with cooking/housework;Assist for transportation;Help with stairs or ramp for entrance   Can travel by private vehicle        Equipment Recommendations None recommended by PT  Recommendations for Other Services       Functional Status Assessment Patient has had a recent decline in their functional status and demonstrates the ability to make significant improvements in function in a reasonable and predictable amount of time.     Precautions / Restrictions Precautions Precautions: Fall;Knee Precaution Booklet Issued: Yes (comment) Recall of Precautions/Restrictions: Intact Precaution/Restrictions Comments: Reviewed precautions that pt should rest with knee in extension and should NOT place a pillow/roll/ice pack under knee. Restrictions Weight Bearing Restrictions Per Provider Order: Yes LLE Weight Bearing Per Provider Order: Weight bearing as tolerated      Mobility  Bed Mobility Overal bed mobility: Modified Independent             General bed mobility comments: No  assist required for transition to/from EOB    Transfers Overall transfer level: Needs assistance Equipment used: Rolling walker (2 wheels) Transfers: Sit to/from Stand Sit to Stand: Supervision, Contact guard assist           General transfer comment: Close supervision for power up to full stand. Slight imbalance once standing and hands on guarding provided    Ambulation/Gait Ambulation/Gait assistance: Contact guard assist Gait Distance (Feet): 200 Feet Assistive device: Rolling walker (2 wheels) Gait Pattern/deviations: Step-through pattern, Decreased stride length, Decreased dorsiflexion - left, Trunk flexed Gait velocity: Decreased Gait velocity interpretation: <1.31 ft/sec, indicative of household ambulator   General Gait Details: VC's for improved posture, closer walker proximity, and forward gaze. Cues throughout for increased heel strike and fluid walker movement. Good step-through gait pattern.  Stairs            Wheelchair Mobility     Tilt Bed    Modified Rankin (Stroke Patients Only)       Balance Overall balance assessment: Needs assistance Sitting-balance support: Feet supported, No upper extremity supported Sitting balance-Leahy Scale: Good     Standing balance support: No upper extremity supported Standing balance-Leahy Scale: Fair Standing balance comment: statically                             Pertinent Vitals/Pain Pain Assessment Pain Assessment: Faces Faces Pain Scale: Hurts little more Pain Location: L knee Pain Descriptors / Indicators: Operative site guarding, Sore Pain Intervention(s): Limited activity within patient's tolerance, Monitored during session, Repositioned    Home Living Family/patient expects to be discharged to:: Private residence Living  Arrangements: Spouse/significant other;Parent Available Help at Discharge: Family;Available 24 hours/day (Mother; wife works) Type of Home: House Home Access: Stairs  to enter   Secretary/administrator of Steps: 2   Home Layout: One level Home Equipment: Agricultural consultant (2 wheels);Shower seat      Prior Function Prior Level of Function : Independent/Modified Independent                     Extremity/Trunk Assessment   Upper Extremity Assessment Upper Extremity Assessment: Overall WFL for tasks assessed    Lower Extremity Assessment Lower Extremity Assessment: LLE deficits/detail LLE Deficits / Details: Acute pain, decreased strength and AROM consistent with above mentioned surgery.    Cervical / Trunk Assessment Cervical / Trunk Assessment: Normal  Communication   Communication Communication: No apparent difficulties    Cognition Arousal: Alert Behavior During Therapy: WFL for tasks assessed/performed   PT - Cognitive impairments: No apparent impairments                         Following commands: Intact       Cueing Cueing Techniques: Verbal cues, Gestural cues     General Comments      Exercises Total Joint Exercises Ankle Circles/Pumps: 10 reps Straight Leg Raises: 10 reps   Assessment/Plan    PT Assessment Patient needs continued PT services  PT Problem List Decreased strength;Decreased activity tolerance;Decreased balance;Decreased mobility;Decreased knowledge of use of DME;Decreased safety awareness;Decreased knowledge of precautions;Pain       PT Treatment Interventions DME instruction;Gait training;Stair training;Functional mobility training;Therapeutic activities;Therapeutic exercise;Balance training;Patient/family education    PT Goals (Current goals can be found in the Care Plan section)  Acute Rehab PT Goals Patient Stated Goal: Home tomorrow PT Goal Formulation: With patient/family Time For Goal Achievement: 02/03/24 Potential to Achieve Goals: Good    Frequency 7X/week     Co-evaluation               AM-PAC PT "6 Clicks" Mobility  Outcome Measure Help needed turning from  your back to your side while in a flat bed without using bedrails?: None Help needed moving from lying on your back to sitting on the side of a flat bed without using bedrails?: None Help needed moving to and from a bed to a chair (including a wheelchair)?: A Little Help needed standing up from a chair using your arms (e.g., wheelchair or bedside chair)?: A Little Help needed to walk in hospital room?: A Little Help needed climbing 3-5 steps with a railing? : A Little 6 Click Score: 20    End of Session Equipment Utilized During Treatment: Gait belt Activity Tolerance: Patient tolerated treatment well Patient left: in bed;with call bell/phone within reach;with family/visitor present Nurse Communication: Mobility status PT Visit Diagnosis: Unsteadiness on feet (R26.81);Pain Pain - Right/Left: Left Pain - part of body: Knee    Time: 0865-7846 PT Time Calculation (min) (ACUTE ONLY): 29 min   Charges:   PT Evaluation $PT Eval Moderate Complexity: 1 Mod PT Treatments $Gait Training: 8-22 mins PT General Charges $$ ACUTE PT VISIT: 1 Visit         Conni Slipper, PT, DPT Acute Rehabilitation Services Secure Chat Preferred Office: [phone]   Marylynn Pearson 01/27/2024, 3:39 PM

## 2024-01-27 NOTE — Anesthesia Postprocedure Evaluation (Signed)
 Anesthesia Post Note  Patient: Levi Alvarez  Procedure(s) Performed: LEFT TOTAL KNEE ARTHROPLASTY (Left: Knee)     Patient location during evaluation: PACU Anesthesia Type: Spinal Level of consciousness: oriented and awake and alert Pain management: pain level controlled Vital Signs Assessment: post-procedure vital signs reviewed and stable Respiratory status: spontaneous breathing, respiratory function stable and patient connected to nasal cannula oxygen Cardiovascular status: blood pressure returned to baseline and stable Postop Assessment: no headache, no backache and no apparent nausea or vomiting Anesthetic complications: no  No notable events documented.  Last Vitals:  Vitals:   01/27/24 1045 01/27/24 1109  BP: 137/81 (!) 141/85  Pulse: (!) 49 (!) 54  Resp: 11 20  Temp: 36.4 C 37.2 C  SpO2: 98% 97%    Last Pain:  Vitals:   01/27/24 1333  TempSrc:   PainSc: 6                  Shelton Silvas

## 2024-01-27 NOTE — Plan of Care (Signed)

## 2024-01-27 NOTE — Anesthesia Procedure Notes (Signed)
 Spinal  Start time: 01/27/2024 7:25 AM End time: 01/27/2024 7:27 AM Reason for block: surgical anesthesia Staffing Performed: anesthesiologist  Anesthesiologist: Shelton Silvas, MD Performed by: Shelton Silvas, MD Authorized by: Shelton Silvas, MD   Preanesthetic Checklist Completed: patient identified, IV checked, site marked, risks and benefits discussed, surgical consent, monitors and equipment checked, pre-op evaluation and timeout performed Spinal Block Patient position: sitting Prep: DuraPrep and site prepped and draped Location: L3-4 Injection technique: single-shot Needle Needle type: Pencan  Needle gauge: 24 G Needle length: 10 cm Needle insertion depth: 10 cm Additional Notes Patient tolerated well. No immediate complications.  Functioning IV was confirmed and monitors were applied. Sterile prep and drape, including hand hygiene and sterile gloves were used. The patient was positioned and the back was prepped. The skin was anesthetized with lidocaine. Free flow of clear CSF was obtained prior to injecting local anesthetic into the CSF. The spinal needle aspirated freely following injection. The needle was carefully withdrawn. The patient tolerated the procedure well.

## 2024-01-27 NOTE — Op Note (Addendum)
 Total Knee Arthroplasty Procedure Note  Preoperative diagnosis: Left knee osteoarthritis  Postoperative diagnosis:same  Operative findings: Complete loss joint space from all 3 compartments Multiple retained screws from prior surgery Preop range of motion 0 to 90 degrees Postop range of motion 0 to 115 degrees  Operative procedure:  Left total knee arthroplasty. CPT [phone] Removal of 3 embedded screws from the left knee.  CPT 20680  Surgeon: N. Glee Arvin, MD  Assist: Oneal Grout, PA-C; necessary for the timely completion of procedure and due to complexity of procedure.  Anesthesia: Spinal, regional, local  Tourniquet time: see anesthesia record  Implants used: Zimmer persona Femur: CR 10 standard Tibia: E Patella: 35 mm Polyethylene: 10 mm medial congruent  Indication: Levi Alvarez is a 53 y.o. year old male with a history of knee pain. Having failed conservative management, the patient elected to proceed with a total knee arthroplasty and hardware removal.  We have reviewed the risk and benefits of the surgery and they elected to proceed after voicing understanding.  Procedure:  After informed consent was obtained and understanding of the risk were voiced including but not limited to bleeding, infection, damage to surrounding structures including nerves and vessels, blood clots, leg length inequality and the failure to achieve desired results, the operative extremity was marked with verbal confirmation of the patient in the holding area.   The patient was then brought to the operating room and transported to the operating room table in the supine position.  A tourniquet was applied to the operative extremity around the upper thigh. The operative limb was then prepped and draped in the usual sterile fashion and preoperative antibiotics were administered.  A time out was performed prior to the start of surgery confirming the correct extremity, preoperative  antibiotic administration, as well as team members, implants and instruments available for the case. Correct surgical site was also confirmed with preoperative radiographs. The limb was then elevated for exsanguination and the tourniquet was inflated. A midline incision was made through the previous surgical scar and a standard medial parapatellar approach was performed.  There was a prominent screw in the subcutaneous tissue around the medial metaphyseal flare.  The soft tissue was cleared from the head of the screw and the screw was removed without any difficulty.  The infrapatellar fat pad was removed.  Circumferential synovectomy was performed.  The knee was very tight in flexion due to the previous trauma and extensive surgery.  A medial peel was performed to release the capsule and the deep MCL off of the medial tibial plateau back to the semimembranosus.  The patella was then everted which showed complete loss of articular cartilage and was prepared and sized to a 35 mm.  A cover was placed on the patella for protection from retractors.  The knee was then brought into full flexion and we then turned our attention to the femur.  The ACL and PCL were sacrificed.  Start site was drilled in the femur and the intramedullary distal femoral cutting guide was placed, set at 5 degrees valgus, taking 10 mm of distal resection. The distal cut was made. Osteophytes were then removed.  Next, the proximal tibial cutting guide was placed with appropriate slope, varus/valgus alignment and depth of resection.  The drop rod was attached to confirm that it was aimed at the second metatarsal.  The proximal tibial cut was made taking 4 mm off the low side. Gap blocks were then used to assess the extension  gap and alignment, and appropriate soft tissue releases were performed. Attention was turned back to the femur, which was sized using the sizing guide to a size 10. Appropriate rotation of the femoral component was determined  using epicondylar axis, Whiteside's line, and assessing the flexion gap under ligament tension.  There were 2 screws in the femur that were in the way of the 4-in-1 cutting block.  The screws were removed from the medial femoral condyle and the lateral femoral condyle without difficulty.  The appropriate size 4-in-1 cutting block was placed and checked with an angel wing and cuts were made. Posterior femoral osteophytes and uncapped bone were then removed with the curved osteotome.  The menisci were removed.  Trial components were placed, and stability was checked in full extension, mid-flexion, and deep flexion. Proper tibial rotation was determined and marked.  The patella tracked well with a lateral release.  The femoral lugs were then drilled.  Trial components were then removed and tibial preparation performed.  The trial tibia was pointed to the medial third of the tibial tubercle.  The tibia was sized for a size E component.   The bony surfaces were irrigated with a pulse lavage and then dried.  Sclerotic bone on the lateral tibial plateau and lateral femoral condyle were drilled to promote cement interdigitation.  Bone cement was vacuum mixed on the back table, and the final components sized above were cemented into place.  Antibiotic irrigation was placed in the knee joint and soft tissues while the cement cured.  After cement had finished curing, excess cement was removed. The stability of the construct was re-evaluated throughout a range of motion and found to be acceptable. The trial liner was removed, the knee was copiously irrigated, and the knee was re-evaluated for any excess bone debris. The real polyethylene liner, 10 mm thick, was inserted and checked to ensure the locking mechanism had engaged appropriately. The tourniquet was deflated and hemostasis was achieved. The wound was irrigated with normal saline.  One gram of vancomycin powder was placed in the surgical bed.  Topical mixture of 0.25%  bupivacaine and meloxicam was placed in the joint for postoperative pain.  Capsular closure was performed with a #1 stratafix in flexion, subcutaneous fat closed with a 0 vicryl suture, then subcutaneous tissue closed with interrupted 2.0 vicryl suture. The skin was then closed with a 2-0 nylon and Dermabond. A sterile dressing was applied.  The patient was awakened in the operating room and taken to recovery in stable condition. All sponge, needle, and instrument counts were correct at the end of the case.  Tessa Lerner was necessary for opening, closing, retracting, limb positioning and overall facilitation and completion of the surgery.  Position: supine  Complications: none.  Time Out: performed   Drains/Packing: none  Estimated blood loss: minimal  Returned to Recovery Room: in good condition.   Antibiotics: yes   Mechanical VTE (DVT) Prophylaxis: sequential compression devices, TED thigh-high  Chemical VTE (DVT) Prophylaxis: Eliquis  Fluid Replacement  Crystalloid: see anesthesia record Blood: none  FFP: none   Specimens Removed: 1 to pathology   Sponge and Instrument Count Correct? yes   PACU: portable radiograph - knee AP and Lateral   Plan/RTC: Return in 2 weeks for wound check.   Weight Bearing/Load Lower Extremity: full   Implant Name Type Inv. Item Serial No. Manufacturer Lot No. LRB No. Used Action  CEMENT BONE REFOBACIN [id number] [place] - [id number] Cement CEMENT BONE REFOBACIN [id number] [place]  ZIMMER RECON(ORTH,TRAU,BIO,SG) [id number] Left 2 Implanted  TIBIA STEM 5 DEG SZ E L KNEE - [id number] Knees TIBIA STEM 5 DEG SZ E L KNEE  ZIMMER RECON(ORTH,TRAU,BIO,SG) [id number] Left 1 Implanted  STEM POLY PAT PLY 34M KNEE - [id number] Knees STEM POLY PAT PLY 34M KNEE  ZIMMER RECON(ORTH,TRAU,BIO,SG) [id number] Left 1 Implanted  STEM TIBIAL 10 8-11 EF POLY LT - [id number] Joint STEM TIBIAL 10 8-11 EF POLY LT  ZIMMER RECON(ORTH,TRAU,BIO,SG) [id number] Left 1 Implanted  PSN FEM CR CMT CCR  STD SZ10 L - [id number] Joint PSN FEM CR CMT CCR STD SZ10 L  ZIMMER RECON(ORTH,TRAU,BIO,SG) [id number] Left 1 Implanted    N. Glee Arvin, MD [facility] 10:31 AM

## 2024-01-27 NOTE — Transfer of Care (Signed)
 Immediate Anesthesia Transfer of Care Note  Patient: Levi Alvarez  Procedure(s) Performed: LEFT TOTAL KNEE ARTHROPLASTY (Left: Knee)  Patient Location: PACU  Anesthesia Type:Spinal  Level of Consciousness: awake  Airway & Oxygen Therapy: Patient Spontanous Breathing  Post-op Assessment: Report given to RN  Post vital signs: Reviewed  Last Vitals:  Vitals Value Taken Time  BP 135/76 01/27/24 0954  Temp 36.6 C 01/27/24 0954  Pulse 48 01/27/24 0959  Resp 16 01/27/24 0959  SpO2 97 % 01/27/24 0959  Vitals shown include unfiled device data.  Last Pain:  Vitals:   01/27/24 0611  TempSrc: Oral  PainSc: 3          Complications: No notable events documented.

## 2024-01-27 NOTE — Discharge Instructions (Signed)

## 2024-01-28 ENCOUNTER — Other Ambulatory Visit (HOSPITAL_COMMUNITY): Payer: Self-pay

## 2024-01-28 ENCOUNTER — Other Ambulatory Visit: Payer: Self-pay | Admitting: Physician Assistant

## 2024-01-28 ENCOUNTER — Encounter (HOSPITAL_COMMUNITY): Payer: Self-pay | Admitting: Orthopaedic Surgery

## 2024-01-28 DIAGNOSIS — M1712 Unilateral primary osteoarthritis, left knee: Secondary | ICD-10-CM | POA: Diagnosis not present

## 2024-01-28 MED ORDER — APIXABAN 2.5 MG PO TABS
2.5000 mg | ORAL_TABLET | Freq: Two times a day (BID) | ORAL | 0 refills | Status: DC
  Filled 2024-01-28: qty 60, 30d supply, fill #0

## 2024-01-28 NOTE — Progress Notes (Signed)
 Physical Therapy Treatment  Patient Details Name: Levi Alvarez MRN: [id number] DOB: Feb 01, 1971 Today's Date: 01/28/2024   History of Present Illness Pt is a 53 y/o male who presents s/p L TKA on 01/27/2024. PMH significant for COPD, DDD cervical, diverticulosis, GIB, hepatitis, HTN, L inguinal hernia, umbilical hernia.    PT Comments  Pt progressing towards physical therapy goals. Was able to perform transfers and ambulation with gross modified independence and RW for support. Pt reviewed full HEP and demonstrated 10 good reps of each exercise. Pt and family educated on car transfer and stair training. Will continue to follow.     If plan is discharge home, recommend the following: A little help with walking and/or transfers;A little help with bathing/dressing/bathroom;Assistance with cooking/housework;Assist for transportation;Help with stairs or ramp for entrance   Can travel by private vehicle        Equipment Recommendations  None recommended by PT    Recommendations for Other Services       Precautions / Restrictions Precautions Precautions: Fall;Knee Precaution Booklet Issued: Yes (comment) Recall of Precautions/Restrictions: Intact Precaution/Restrictions Comments: Reviewed precautions that pt should rest with knee in extension and should NOT place a pillow/roll/ice pack under knee. Restrictions Weight Bearing Restrictions Per Provider Order: No LLE Weight Bearing Per Provider Order: Weight bearing as tolerated     Mobility  Bed Mobility Overal bed mobility: Modified Independent                  Transfers Overall transfer level: Modified independent Equipment used: Rolling walker (2 wheels) Transfers: Sit to/from Stand             General transfer comment: No assist required for power up to full stand. No unsteadiness or LOB noted.    Ambulation/Gait Ambulation/Gait assistance: Modified independent (Device/Increase time) Gait Distance (Feet): 350  Feet Assistive device: Rolling walker (2 wheels) Gait Pattern/deviations: Step-through pattern, Decreased stride length, Decreased dorsiflexion - left, Trunk flexed Gait velocity: Decreased Gait velocity interpretation: 1.31 - 2.62 ft/sec, indicative of limited community ambulator   General Gait Details: VC's for improved posture, closer walker proximity, and forward gaze. Cues throughout for increased heel strike and fluid walker movement. Good step-through gait pattern.   Stairs Stairs: Yes Stairs assistance: Modified independent (Device/Increase time) Stair Management: One rail Right, Step to pattern, Forwards Number of Stairs: 10 General stair comments: VC's for sequencing and general safety.   Wheelchair Mobility     Tilt Bed    Modified Rankin (Stroke Patients Only)       Balance Overall balance assessment: Needs assistance Sitting-balance support: Feet supported, No upper extremity supported Sitting balance-Leahy Scale: Good     Standing balance support: No upper extremity supported Standing balance-Leahy Scale: Fair Standing balance comment: statically                            Communication Communication Communication: No apparent difficulties  Cognition Arousal: Alert Behavior During Therapy: WFL for tasks assessed/performed   PT - Cognitive impairments: No apparent impairments                         Following commands: Intact      Cueing Cueing Techniques: Verbal cues, Gestural cues  Exercises Total Joint Exercises Ankle Circles/Pumps: 10 reps Quad Sets: 10 reps Short Arc Quad: 10 reps Heel Slides: 10 reps Hip ABduction/ADduction: 10 reps Straight Leg Raises: 10 reps Long Arc Quad: 10  reps Knee Flexion: 10 reps Goniometric ROM: 0-107 AROM in sitting L knee    General Comments        Pertinent Vitals/Pain Pain Assessment Pain Assessment: Faces Faces Pain Scale: Hurts a little bit Pain Location: L knee Pain  Descriptors / Indicators: Operative site guarding, Sore Pain Intervention(s): Limited activity within patient's tolerance, Monitored during session, Repositioned    Home Living                          Prior Function            PT Goals (current goals can now be found in the care plan section) Acute Rehab PT Goals Patient Stated Goal: Home tomorrow PT Goal Formulation: With patient/family Time For Goal Achievement: 02/03/24 Potential to Achieve Goals: Good Progress towards PT goals: Progressing toward goals    Frequency    7X/week      PT Plan      Co-evaluation              AM-PAC PT "6 Clicks" Mobility   Outcome Measure  Help needed turning from your back to your side while in a flat bed without using bedrails?: None Help needed moving from lying on your back to sitting on the side of a flat bed without using bedrails?: None Help needed moving to and from a bed to a chair (including a wheelchair)?: A Little Help needed standing up from a chair using your arms (e.g., wheelchair or bedside chair)?: A Little Help needed to walk in hospital room?: A Little Help needed climbing 3-5 steps with a railing? : A Little 6 Click Score: 20    End of Session Equipment Utilized During Treatment: Gait belt Activity Tolerance: Patient tolerated treatment well Patient left: in bed;with call bell/phone within reach;with family/visitor present Nurse Communication: Mobility status PT Visit Diagnosis: Unsteadiness on feet (R26.81);Pain Pain - Right/Left: Left Pain - part of body: Knee     Time: 0922-0947 PT Time Calculation (min) (ACUTE ONLY): 25 min  Charges:    $Gait Training: 8-22 mins $Therapeutic Exercise: 8-22 mins PT General Charges $$ ACUTE PT VISIT: 1 Visit                     Conni Slipper, PT, DPT Acute Rehabilitation Services Secure Chat Preferred Office: [phone]    Levi Alvarez 01/28/2024, 1:35 PM

## 2024-01-28 NOTE — Evaluation (Signed)
 Occupational Therapy Evaluation and Discharge Patient Details Name: Levi Alvarez MRN: [id number] DOB: 26-Feb-1971 Today's Date: 01/28/2024   History of Present Illness   Pt is a 53 y/o male who presents s/p L TKA on 01/27/2024. PMH significant for COPD, DDD cervical, diverticulosis, GIB, hepatitis, HTN, L inguinal hernia, umbilical hernia.     Clinical Impressions This 53 yo male admitted and underwent above presents to acute OT with all education completed with him and his fiance in room. Acute OT will sign off.     If plan is discharge home, recommend the following:   A little help with walking and/or transfers;A little help with bathing/dressing/bathroom;Assist for transportation;Help with stairs or ramp for entrance     Functional Status Assessment   Patient has had a recent decline in their functional status and demonstrates the ability to make significant improvements in function in a reasonable and predictable amount of time. (without further need for skilled OT, all education completed)     Equipment Recommendations   None recommended by OT      Precautions/Restrictions   Precautions Precautions: Fall;Knee Restrictions Weight Bearing Restrictions Per Provider Order: No LLE Weight Bearing Per Provider Order: Weight bearing as tolerated     Mobility Bed Mobility               General bed mobility comments: Pt and fiance report he is moving well    Transfers                   General transfer comment: Pt and fiance report he is moving well          ADL either performed or assessed with clinical judgement   ADL                                         General ADL Comments: Educated pt and fiance on sequence of LBD for ease, donning TED hose, stepping into and out of walk in shower, use of CPM intermittently (not all at once) for the 6 hours he says he is to use it.     Vision Patient Visual Report: No change from  baseline              Pertinent Vitals/Pain Pain Assessment Pain Assessment: Faces Faces Pain Scale: Hurts even more Pain Location: L knee Pain Descriptors / Indicators: Operative site guarding, Sore Pain Intervention(s): Limited activity within patient's tolerance     Extremity/Trunk Assessment Upper Extremity Assessment Upper Extremity Assessment: Overall WFL for tasks assessed           Communication Communication Communication: No apparent difficulties   Cognition Arousal: Alert Behavior During Therapy: WFL for tasks assessed/performed                                         Cueing   Cueing Techniques: Verbal cues;Gestural cues              Home Living Family/patient expects to be discharged to:: Private residence Living Arrangements: Spouse/significant other;Parent Available Help at Discharge: Family;Available 24 hours/day Type of Home: House Home Access: Stairs to enter [employer] of Steps: 2   Home Layout: One level     Bathroom Shower/Tub: Producer, television/film/video: Standard     Home  Equipment: Agricultural consultant (2 wheels);Shower seat;Hand held shower head          Prior Functioning/Environment Prior Level of Function : Independent/Modified Independent                    OT Problem List: Decreased strength;Decreased range of motion;Impaired balance (sitting and/or standing);Pain        OT Goals(Current goals can be found in the care plan section)   Acute Rehab OT Goals Patient Stated Goal: home today      AM-PAC OT "6 Clicks" Daily Activity     Outcome Measure Help from another person eating meals?: None Help from another person taking care of personal grooming?: A Little Help from another person toileting, which includes using toliet, bedpan, or urinal?: A Little Help from another person bathing (including washing, rinsing, drying)?: A Little Help from another person to put on and taking  off regular upper body clothing?: A Little Help from another person to put on and taking off regular lower body clothing?: A Little 6 Click Score: 19   End of Session Nurse Communication:  (no futher OT needs)  Activity Tolerance: Patient tolerated treatment well Patient left: in bed;with call bell/phone within reach;with family/visitor present  OT Visit Diagnosis: Other abnormalities of gait and mobility (R26.89);Muscle weakness (generalized) (M62.81);Pain Pain - Right/Left: Left Pain - part of body: Knee                Time: 0815-0827 OT Time Calculation (min): 12 min Charges:  OT General Charges $OT Visit: 1 Visit OT Evaluation $OT Eval Low Complexity: 1 Low  Levi Alvarez OT Acute Rehabilitation Services Office [phone]    Levi Alvarez 01/28/2024, 9:14 AM

## 2024-01-28 NOTE — Progress Notes (Signed)
 Patient in no acute distress nor complaints of pain nor discomfort; incision on knee is clean, dry and intact; No c/o pain at this time. Room was checked and accounted for all patient's belongings; discharge instructions concerning her medications, incision care, follow up appointment and when to call the doctor as needed were all discussed with patient by RN and he expressed understanding on the instructions given

## 2024-01-28 NOTE — Care Management Obs Status (Signed)
 MEDICARE OBSERVATION STATUS NOTIFICATION   Patient Details  Name: Levi Alvarez MRN: [id number] Date of Birth: 1971-04-23   Medicare Observation Status Notification Given:  Yes    Kermit Balo, RN 01/28/2024, 8:49 AM

## 2024-01-28 NOTE — Discharge Summary (Signed)
 Patient ID: Levi Alvarez MRN: [id number] DOB/AGE: 52-16-1972 52 y.o.  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   Constipation    Resolved   COPD (chronic obstructive pulmonary disease) (HCC)    DDD (degenerative disc disease), cervical    Diverticulosis    ED (erectile dysfunction)    Esophageal varices (HCC)    GERD (gastroesophageal reflux disease)    H/O: upper GI bleed    Hepatitis    Hepatitis C antibody positive in blood    resolved   High blood pressure    History of anemia    Internal hemorrhoids    Left inguinal hernia 2020   Lung nodule    Small   Portal hypertensive gastropathy (HCC)    Sleep apnea    trying to get used to his CPAP, not wearing CPAP per SO [name] McAdoo   Umbilical hernia 2020    Surgeries: Procedure(s): LEFT TOTAL KNEE ARTHROPLASTY on 01/27/2024   Consultants:   Discharged Condition: Improved  Hospital Course: Levi Alvarez is an 53 y.o. male who was admitted 01/27/2024 for operative treatment ofPost-traumatic osteoarthritis of left knee. Patient has severe unremitting pain that affects sleep, daily activities, and work/hobbies. After pre-op clearance the patient was taken to the operating room on 01/27/2024 and underwent  Procedure(s): LEFT TOTAL KNEE ARTHROPLASTY.    Patient was given perioperative antibiotics:  Anti-infectives (From admission, onward)    Start     Dose/Rate Route Frequency Ordered Stop   01/27/24 1330  ceFAZolin (ANCEF) IVPB 2g/100 mL premix        2 g 200 mL/hr over 30 Minutes Intravenous Every 6 hours 01/27/24 1054 01/27/24 1915   01/27/24 1100   doxycycline (VIBRA-TABS) tablet 100 mg       Note to Pharmacy: To be taken after surgery     100 mg Oral 2 times daily 01/27/24 1054     01/27/24 0904  vancomycin (VANCOCIN) powder  Status:  Discontinued          As needed 01/27/24 0904 01/27/24 0951   01/27/24 0718  ceFAZolin (ANCEF) 2-4 GM/100ML-% IVPB       Note to Pharmacy: Kandice Hams D: cabinet override      01/27/24 0718 01/27/24 1929        Patient was given sequential compression devices, early ambulation, and chemoprophylaxis to prevent DVT.  Patient benefited maximally from hospital stay and there were no complications.    Recent vital signs: Patient Vitals for the past 24 hrs:  BP Temp Temp src Pulse Resp SpO2  01/28/24 0717 111/71 97.8 F (36.6 C) Oral (!) 57 18 96 %  01/28/24 0315 103/62 98.1 F (36.7 C) Oral (!) 56 18 98 %  01/27/24 2319 113/65 97.7 F (36.5 C) Oral 61 18 98 %  01/27/24 1923 108/69 97.9 F (36.6 C) Oral (!) 52 18 98 %  01/27/24 1620 118/76 97.9 F (36.6 C) Oral (!) 53 20 98 %  01/27/24 1109 (!) 141/85 98.9 F (37.2 C) -- (!) 54 20 97 %  01/27/24 1045 137/81 97.6 F (36.4 C) -- (!)  49 11 98 %  01/27/24 1030 130/81 -- -- (!) 51 12 98 %  01/27/24 1015 131/78 -- -- (!) 51 15 97 %  01/27/24 1000 129/83 -- -- (!) 48 16 97 %  01/27/24 0954 135/76 97.8 F (36.6 C) -- (!) 45 (!) 9 97 %     Recent laboratory studies: No results for input(s): "WBC", "HGB", "HCT", "PLT", "NA", "K", "CL", "CO2", "BUN", "CREATININE", "GLUCOSE", "INR", "CALCIUM" in the last 72 hours.  Invalid input(s): "PT", "2"   Discharge Medications:   Allergies as of 01/28/2024       Reactions   Onion Swelling   Raw onions    Gabapentin Other (See Comments)   Restless legs   Naproxen Hives, Swelling        Medication List     TAKE these medications    apixaban 2.5 MG Tabs tablet Commonly known as: Eliquis Take 1 tablet (2.5 mg total) by mouth 2 (two) times daily. To be taken after surgery to prevent blood clots    carvedilol 6.25 MG tablet Commonly known as: COREG Take 1 tablet (6.25 mg total) by mouth 2 (two) times daily with a meal.   docusate sodium 100 MG capsule Commonly known as: Colace Take 1 capsule (100 mg total) by mouth daily as needed.   doxycycline 100 MG capsule Commonly known as: Vibramycin Take 1 capsule (100 mg total) by mouth 2 (two) times daily. To be taken after surgery   folic acid 1 MG tablet Commonly known as: FOLVITE Take 1 mg by mouth in the morning.   loratadine 10 MG tablet Commonly known as: CLARITIN Take 10 mg by mouth daily.   Magnesium 250 MG Caps Take 250 mg by mouth daily.   methocarbamol 750 MG tablet Commonly known as: Robaxin-750 Take 1 tablet (750 mg total) by mouth 3 (three) times daily as needed for muscle spasms.   MILK THISTLE PO Take 1 capsule by mouth 2 (two) times daily.   multivitamin with minerals Tabs tablet Take 1 tablet by mouth daily with breakfast.   ondansetron 4 MG tablet Commonly known as: Zofran Take 1 tablet (4 mg total) by mouth every 8 (eight) hours as needed for nausea or vomiting.   oxyCODONE-acetaminophen 5-325 MG tablet Commonly known as: Percocet Take 1-2 tablets by mouth every 6 (six) hours as needed. To be taken after surgery   pantoprazole 40 MG tablet Commonly known as: PROTONIX Take 1 tablet (40 mg total) by mouth daily.   sildenafil 100 MG tablet Commonly known as: VIAGRA 1/2-1 tab po prn   tamsulosin 0.4 MG Caps capsule Commonly known as: FLOMAX Take 1 capsule (0.4 mg total) by mouth daily.   testosterone cypionate 200 MG/ML injection Commonly known as: DEPOTESTOSTERONE CYPIONATE Inject 100 mg into the skin once a week.   thiamine 100 MG tablet Commonly known as: VITAMIN B1 Take 1 tablet (100 mg total) by mouth in the morning.   traZODone 50 MG tablet Commonly known as: DESYREL Take 1 tablet (50 mg total) by mouth at bedtime. For sleep   venlafaxine XR 75 MG 24 hr capsule Commonly known as:  EFFEXOR-XR TAKE 1 CAPSULE BY MOUTH DAILY.   Ventolin HFA 108 (90 Base) MCG/ACT inhaler Generic drug: albuterol INHALE 1 PUFF EVERY 6 (SIX) HOURS AS NEEDED INTO THE LUNGS FOR WHEEZING OR SHORTNESS OF BREATH.               Durable Medical Equipment  (From admission, onward)  Start     Ordered   01/27/24 1055  DME Walker rolling  Once       Question Answer Comment  Walker: With 5 Inch Wheels   Patient needs a walker to treat with the following condition Status post left partial knee replacement      01/27/24 1054   01/27/24 1055  DME 3 n 1  Once        01/27/24 1054   01/27/24 1055  DME Bedside commode  Once       Question:  Patient needs a bedside commode to treat with the following condition  Answer:  Status post left partial knee replacement   01/27/24 1054            Diagnostic Studies: DG Knee Left Port Result Date: 01/27/2024 CLINICAL DATA:  Postop. EXAM: PORTABLE LEFT KNEE - 1-2 VIEW COMPARISON:  08/27/2023 FINDINGS: Left knee arthroplasty in expected alignment. No periprosthetic lucency or fracture. Previous surgery in the proximal tibia and distal femur. Recent postsurgical change includes air and edema in the soft tissues and joint space. IMPRESSION: Left knee arthroplasty without immediate postoperative complication. Electronically Signed   By: Narda Rutherford M.D.   On: 01/27/2024 10:31    Disposition: Discharge disposition: 01-Home or Self Care          Follow-up Information     Cristie Hem, PA-C. Schedule an appointment as soon as possible for a visit in 2 week(s).   Specialty: Orthopedic Surgery Contact information: [address] [place] [address] [phone]         [facility] Follow up.   Why: [facility] will contact you for the first home visit Contact information: [phone]                 Signed: Cristie Hem 01/28/2024, 8:22 AM

## 2024-01-28 NOTE — Progress Notes (Signed)
 Subjective: 1 Day Post-Op Procedure(s) (LRB): LEFT TOTAL KNEE ARTHROPLASTY (Left) Patient reports pain as mild.    Objective: Vital signs in last 24 hours: Temp:  [97.6 F (36.4 C)-98.9 F (37.2 C)] 97.8 F (36.6 C) (03/18 0717) Pulse Rate:  [45-61] 57 (03/18 0717) Resp:  [9-20] 18 (03/18 0717) BP: (103-141)/(62-85) 111/71 (03/18 0717) SpO2:  [96 %-98 %] 96 % (03/18 0717)  Intake/Output from previous day: 03/17 0701 - 03/18 0700 In: 1850 [P.O.:1200; I.V.:650] Out: 1050 [Urine:1000; Blood:50] Intake/Output this shift: No intake/output data recorded.  No results for input(s): "HGB" in the last 72 hours. No results for input(s): "WBC", "RBC", "HCT", "PLT" in the last 72 hours. No results for input(s): "NA", "K", "CL", "CO2", "BUN", "CREATININE", "GLUCOSE", "CALCIUM" in the last 72 hours. No results for input(s): "LABPT", "INR" in the last 72 hours.  Neurologically intact Neurovascular intact Sensation intact distally Intact pulses distally Dorsiflexion/Plantar flexion intact Incision: scant drainage No cellulitis present Compartment soft   Assessment/Plan: 1 Day Post-Op Procedure(s) (LRB): LEFT TOTAL KNEE ARTHROPLASTY (Left) Advance diet Up with therapy D/C IV fluids Discharge home with home health once cleared by PT WBAT LLE      Cristie Hem 01/28/2024, 8:21 AM

## 2024-01-28 NOTE — Plan of Care (Signed)
  Problem: Education: Goal: Knowledge of General Education information will improve Description: Including pain rating scale, medication(s)/side effects and non-pharmacologic comfort measures Outcome: Completed/Met   Problem: Health Behavior/Discharge Planning: Goal: Ability to manage health-related needs will improve Outcome: Completed/Met   Problem: Clinical Measurements: Goal: Ability to maintain clinical measurements within normal limits will improve Outcome: Completed/Met Goal: Will remain free from infection Outcome: Completed/Met Goal: Diagnostic test results will improve Outcome: Completed/Met Goal: Respiratory complications will improve Outcome: Completed/Met Goal: Cardiovascular complication will be avoided Outcome: Completed/Met   Problem: Activity: Goal: Risk for activity intolerance will decrease Outcome: Completed/Met   Problem: Nutrition: Goal: Adequate nutrition will be maintained Outcome: Completed/Met   Problem: Coping: Goal: Level of anxiety will decrease Outcome: Completed/Met   Problem: Elimination: Goal: Will not experience complications related to bowel motility Outcome: Completed/Met Goal: Will not experience complications related to urinary retention Outcome: Completed/Met   Problem: Pain Managment: Goal: General experience of comfort will improve and/or be controlled Outcome: Completed/Met   Problem: Safety: Goal: Ability to remain free from injury will improve Outcome: Completed/Met   Problem: Skin Integrity: Goal: Risk for impaired skin integrity will decrease Outcome: Completed/Met   Problem: Education: Goal: Knowledge of the prescribed therapeutic regimen will improve Outcome: Completed/Met Goal: Individualized Educational Video(s) Outcome: Completed/Met   Problem: Activity: Goal: Ability to avoid complications of mobility impairment will improve Outcome: Completed/Met Goal: Range of joint motion will improve Outcome:  Completed/Met   Problem: Clinical Measurements: Goal: Postoperative complications will be avoided or minimized Outcome: Completed/Met   Problem: Pain Management: Goal: Pain level will decrease with appropriate interventions Outcome: Completed/Met   Problem: Skin Integrity: Goal: Will show signs of wound healing Outcome: Completed/Met

## 2024-01-31 ENCOUNTER — Encounter (HOSPITAL_COMMUNITY): Payer: Self-pay

## 2024-01-31 ENCOUNTER — Emergency Department (HOSPITAL_COMMUNITY)
Admission: EM | Admit: 2024-01-31 | Discharge: 2024-02-01 | Disposition: A | Discharge status: Home / Self Care | Attending: Emergency Medicine | Admitting: Emergency Medicine

## 2024-01-31 ENCOUNTER — Encounter: Payer: Self-pay | Admitting: Orthopaedic Surgery

## 2024-01-31 ENCOUNTER — Other Ambulatory Visit: Payer: Self-pay

## 2024-01-31 ENCOUNTER — Ambulatory Visit (INDEPENDENT_AMBULATORY_CARE_PROVIDER_SITE_OTHER): Admitting: Orthopaedic Surgery

## 2024-01-31 DIAGNOSIS — M79605 Pain in left leg: Secondary | ICD-10-CM | POA: Diagnosis present

## 2024-01-31 DIAGNOSIS — I10 Essential (primary) hypertension: Secondary | ICD-10-CM | POA: Insufficient documentation

## 2024-01-31 DIAGNOSIS — R509 Fever, unspecified: Secondary | ICD-10-CM | POA: Diagnosis not present

## 2024-01-31 DIAGNOSIS — R42 Dizziness and giddiness: Secondary | ICD-10-CM | POA: Insufficient documentation

## 2024-01-31 DIAGNOSIS — M25562 Pain in left knee: Secondary | ICD-10-CM | POA: Insufficient documentation

## 2024-01-31 DIAGNOSIS — Y838 Other surgical procedures as the cause of abnormal reaction of the patient, or of later complication, without mention of misadventure at the time of the procedure: Secondary | ICD-10-CM | POA: Diagnosis not present

## 2024-01-31 DIAGNOSIS — J449 Chronic obstructive pulmonary disease, unspecified: Secondary | ICD-10-CM | POA: Insufficient documentation

## 2024-01-31 DIAGNOSIS — M1732 Unilateral post-traumatic osteoarthritis, left knee: Secondary | ICD-10-CM

## 2024-01-31 DIAGNOSIS — Z7901 Long term (current) use of anticoagulants: Secondary | ICD-10-CM | POA: Insufficient documentation

## 2024-01-31 DIAGNOSIS — Z79899 Other long term (current) drug therapy: Secondary | ICD-10-CM | POA: Insufficient documentation

## 2024-01-31 DIAGNOSIS — Z96652 Presence of left artificial knee joint: Secondary | ICD-10-CM | POA: Diagnosis not present

## 2024-01-31 DIAGNOSIS — D72829 Elevated white blood cell count, unspecified: Secondary | ICD-10-CM | POA: Diagnosis not present

## 2024-01-31 DIAGNOSIS — F17210 Nicotine dependence, cigarettes, uncomplicated: Secondary | ICD-10-CM | POA: Diagnosis not present

## 2024-01-31 DIAGNOSIS — M96840 Postprocedural hematoma of a musculoskeletal structure following a musculoskeletal system procedure: Secondary | ICD-10-CM | POA: Diagnosis not present

## 2024-01-31 LAB — D-DIMER, QUANTITATIVE: D-Dimer, Quant: 1.77 ug{FEU}/mL — ABNORMAL HIGH (ref 0.00–0.50)

## 2024-01-31 LAB — CBC WITH DIFFERENTIAL/PLATELET
Abs Immature Granulocytes: 0.06 10*3/uL (ref 0.00–0.07)
Basophils Absolute: 0 10*3/uL (ref 0.0–0.1)
Basophils Relative: 0 %
Eosinophils Absolute: 0.1 10*3/uL (ref 0.0–0.5)
Eosinophils Relative: 1 %
HCT: 32.1 % — ABNORMAL LOW (ref 39.0–52.0)
Hemoglobin: 10.7 g/dL — ABNORMAL LOW (ref 13.0–17.0)
Immature Granulocytes: 1 %
Lymphocytes Relative: 21 %
Lymphs Abs: 2.5 10*3/uL (ref 0.7–4.0)
MCH: 27 pg (ref 26.0–34.0)
MCHC: 33.3 g/dL (ref 30.0–36.0)
MCV: 81.1 fL (ref 80.0–100.0)
Monocytes Absolute: 1.2 10*3/uL — ABNORMAL HIGH (ref 0.1–1.0)
Monocytes Relative: 10 %
Neutro Abs: 8 10*3/uL — ABNORMAL HIGH (ref 1.7–7.7)
Neutrophils Relative %: 67 %
Platelets: 148 10*3/uL — ABNORMAL LOW (ref 150–400)
RBC: 3.96 MIL/uL — ABNORMAL LOW (ref 4.22–5.81)
RDW: 17.7 % — ABNORMAL HIGH (ref 11.5–15.5)
WBC: 11.9 10*3/uL — ABNORMAL HIGH (ref 4.0–10.5)
nRBC: 0 % (ref 0.0–0.2)

## 2024-01-31 LAB — COMPREHENSIVE METABOLIC PANEL
ALT: 27 U/L (ref 0–44)
AST: 27 U/L (ref 15–41)
Albumin: 3.4 g/dL — ABNORMAL LOW (ref 3.5–5.0)
Alkaline Phosphatase: 68 U/L (ref 38–126)
Anion gap: 8 (ref 5–15)
BUN: 15 mg/dL (ref 6–20)
CO2: 26 mmol/L (ref 22–32)
Calcium: 9.4 mg/dL (ref 8.9–10.3)
Chloride: 99 mmol/L (ref 98–111)
Creatinine, Ser: 0.79 mg/dL (ref 0.61–1.24)
GFR, Estimated: >60 mL/min (ref >60)
Glucose, Bld: 127 mg/dL — ABNORMAL HIGH (ref 70–99)
Potassium: 3.9 mmol/L (ref 3.5–5.1)
Sodium: 133 mmol/L — ABNORMAL LOW (ref 135–145)
Total Bilirubin: 0.8 mg/dL (ref 0.0–1.2)
Total Protein: 6.6 g/dL (ref 6.5–8.1)

## 2024-01-31 LAB — URINALYSIS, ROUTINE W REFLEX MICROSCOPIC
Bilirubin Urine: NEGATIVE
Glucose, UA: NEGATIVE mg/dL
Hgb urine dipstick: NEGATIVE
Ketones, ur: NEGATIVE mg/dL
Leukocytes,Ua: NEGATIVE
Nitrite: NEGATIVE
Protein, ur: NEGATIVE mg/dL
Specific Gravity, Urine: <1.005 — ABNORMAL LOW (ref 1.005–1.030)
pH: 6.5 (ref 5.0–8.0)

## 2024-01-31 LAB — I-STAT CG4 LACTIC ACID, ED: Lactic Acid, Venous: 1 mmol/L (ref 0.5–1.9)

## 2024-01-31 MED ORDER — MORPHINE SULFATE (PF) 4 MG/ML IV SOLN
4.0000 mg | Freq: Once | INTRAVENOUS | Status: AC
  Administered 2024-01-31: 4 mg via INTRAVENOUS
  Filled 2024-01-31: qty 1

## 2024-01-31 NOTE — ED Provider Notes (Signed)
 [place] [facility] AT [facility] Provider Note   CSN: [id number] Arrival date & time: 01/31/24  2104     History  No chief complaint on file.   STEPHANIE MCGLONE is a 53 y.o. male status post left TKA on 3/17 with Dr. Roda Shutters presents with worsening left knee pain, intermittent dizziness and fevers for the past couple days.  Patient is now 4 days status post surgery.  He had 2 days of notable recovery was regaining significant range of motion and pain was well-controlled.  However in the past 2 days his pain has significantly worsened.  Reports he is urinating a lot without dysuria.  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   Constipation    Resolved   COPD (chronic obstructive pulmonary disease) (HCC)    DDD (degenerative disc disease), cervical    Diverticulosis    ED (erectile dysfunction)    Esophageal varices (HCC)    GERD (gastroesophageal reflux disease)    H/O: upper GI bleed    Hepatitis    Hepatitis C antibody positive in blood    resolved   High blood pressure    History of anemia    Internal hemorrhoids    Left inguinal hernia 2020   Lung nodule    Small   Portal hypertensive gastropathy (HCC)    Sleep apnea    trying to get used to his CPAP, not wearing CPAP per SO [name] McAdoo   Umbilical hernia 2020     Home Medications Prior to Admission medications   Medication Sig Start Date End Date Taking? Authorizing Provider  doxycycline (VIBRAMYCIN) 100 MG capsule Take 1 capsule (100 mg total) by mouth 2 (two) times daily. To be taken after surgery 01/09/24   Cristie Hem, PA-C  apixaban (ELIQUIS) 2.5 MG TABS tablet Take 1 tablet (2.5 mg total) by mouth 2 (two) times daily. To be taken after surgery to prevent blood clots 01/28/24   Cristie Hem, PA-C  carvedilol (COREG) 6.25 MG tablet  Take 1 tablet (6.25 mg total) by mouth 2 (two) times daily with a meal. 06/18/22   Edsel Petrin, DO  docusate sodium (COLACE) 100 MG capsule Take 1 capsule (100 mg total) by mouth daily as needed. 01/09/24 01/08/25  Cristie Hem, PA-C  folic acid (FOLVITE) 1 MG tablet Take 1 mg by mouth in the morning.    [provider]  loratadine (CLARITIN) 10 MG tablet Take 10 mg by mouth daily.    [provider]  Magnesium 250 MG CAPS Take 250 mg by mouth daily.    [provider]  methocarbamol (ROBAXIN-750) 750 MG tablet Take 1 tablet (750 mg total) by mouth 3 (three) times daily as needed for muscle spasms. 01/09/24   Cristie Hem, PA-C  MILK THISTLE PO Take 1 capsule by mouth 2 (two) times daily.    [provider]  Multiple Vitamin (MULTIVITAMIN WITH MINERALS) TABS tablet Take 1 tablet by mouth daily with breakfast.    [provider]  ondansetron (ZOFRAN) 4 MG tablet Take 1 tablet (4 mg total) by mouth every 8 (eight) hours as needed for nausea or vomiting. 01/09/24   Cristie Hem, PA-C  oxyCODONE-acetaminophen (PERCOCET) 5-325 MG tablet Take 1-2 tablets by  mouth every 6 (six) hours as needed. To be taken after surgery 01/09/24   Cristie Hem, PA-C  pantoprazole (PROTONIX) 40 MG tablet Take 1 tablet (40 mg total) by mouth daily. 12/16/23   Billie Lade, MD  sildenafil (VIAGRA) 100 MG tablet 1/2-1 tab po prn 06/24/23   Marcine Matar, MD  tamsulosin (FLOMAX) 0.4 MG CAPS capsule Take 1 capsule (0.4 mg total) by mouth daily. 05/18/22   Vaillancourt, Lelon Mast, PA-C  testosterone cypionate (DEPOTESTOSTERONE CYPIONATE) 200 MG/ML injection Inject 100 mg into the skin once a week. 11/02/23   [provider]  Thiamine HCl (VITAMIN B1) 100 MG TABS Take 1 tablet (100 mg total) by mouth in the morning. 01/29/22   Edsel Petrin, DO  traZODone (DESYREL) 50 MG tablet Take 1 tablet (50 mg total) by mouth at bedtime. For sleep 05/31/22    Edsel Petrin, DO  venlafaxine XR (EFFEXOR-XR) 75 MG 24 hr capsule TAKE 1 CAPSULE BY MOUTH DAILY. 08/15/20   Hazel Sams, NP  VENTOLIN HFA 108 (90 Base) MCG/ACT inhaler INHALE 1 PUFF EVERY 6 (SIX) HOURS AS NEEDED INTO THE LUNGS FOR WHEEZING OR SHORTNESS OF BREATH. 05/19/20   Hoy Register, MD      Allergies    Onion, Gabapentin, and Naproxen    Review of Systems   Review of Systems  Musculoskeletal:  Positive for myalgias.    Physical Exam Updated Vital Signs BP 131/89   Pulse 70   Temp 98.2 F (36.8 C) (Oral)   Resp 18   Ht 5\' 9"  (1.753 m)   Wt 95.3 kg   SpO2 97%   BMI 31.01 kg/m  Physical Exam Vitals and nursing note reviewed.  Constitutional:      General: He is not in acute distress.    Appearance: He is well-developed.  HENT:     Head: Normocephalic and atraumatic.  Eyes:     Conjunctiva/sclera: Conjunctivae normal.  Cardiovascular:     Rate and Rhythm: Normal rate and regular rhythm.     Heart sounds: No murmur heard. Pulmonary:     Effort: Pulmonary effort is normal. No respiratory distress.     Breath sounds: Normal breath sounds.  Abdominal:     Palpations: Abdomen is soft.     Tenderness: There is no abdominal tenderness.  Musculoskeletal:        General: No swelling.     Cervical back: Neck supple.     Comments: Left knee incision without dehiscence or drainage.  There is no overlying erythema or fluctuance.  Knee does appear expectantly swollen, with minimal warmth.  Range of motion is limited due to pain, DP pulses symmetric.  NVI.  There is proximal thigh ecchymosis that is tender to palpation  Skin:    General: Skin is warm and dry.     Capillary Refill: Capillary refill takes less than 2 seconds.  Neurological:     Mental Status: He is alert.     Comments: Patient is alert and oriented. There is no abnormal phonation. Symmetric smile without facial droop.  Moves all extremities spontaneously. 5/5 strength in upper and lower extremities.   No sensation deficit. There is no nystagmus. EOMI, PERRL. Coordination intact with finger to nose .    Psychiatric:        Mood and Affect: Mood normal.     ED Results / Procedures / Treatments   Labs (all labs ordered are listed, but only abnormal results are displayed) Labs Reviewed  CBC  WITH DIFFERENTIAL/PLATELET - Abnormal; Notable for the following components:      Result Value   WBC 11.9 (*)    RBC 3.96 (*)    Hemoglobin 10.7 (*)    HCT 32.1 (*)    RDW 17.7 (*)    Platelets 148 (*)    Neutro Abs 8.0 (*)    Monocytes Absolute 1.2 (*)    All other components within normal limits  COMPREHENSIVE METABOLIC PANEL - Abnormal; Notable for the following components:   Sodium 133 (*)    Glucose, Bld 127 (*)    Albumin 3.4 (*)    All other components within normal limits  URINALYSIS, ROUTINE W REFLEX MICROSCOPIC - Abnormal; Notable for the following components:   Specific Gravity, Urine <1.005 (*)    All other components within normal limits  D-DIMER, QUANTITATIVE - Abnormal; Notable for the following components:   D-Dimer, Quant 1.77 (*)    All other components within normal limits  CULTURE, BLOOD (ROUTINE X 2)  CULTURE, BLOOD (ROUTINE X 2)  RESP PANEL BY RT-PCR (RSV, FLU A&B, COVID)  RVPGX2  I-STAT CG4 LACTIC ACID, ED  I-STAT CG4 LACTIC ACID, ED    EKG None  Radiology No results found.  Procedures Procedures    Medications Ordered in ED Medications  morphine (PF) 4 MG/ML injection 4 mg (4 mg Intravenous Given 01/31/24 2301)    ED Course/ Medical Decision Making/ A&P                                 Medical Decision Making  This patient presents to the ED with chief complaint(s) of leg pain .  The complaint involves an extensive differential diagnosis and also carries with it a high risk of complications and morbidity.   pertinent past medical history as listed in HPI  The differential diagnosis includes  Septic joint, gout, sepsis, DVT, postoperative pain,  UTI, pneumonia  Additional history obtained: Additional history obtained from family Records reviewed Care Everywhere/External Records  Initial Assessment:   Hemodynamically stable, afebrile, nontoxic-appearing patient presenting with intermittent dizziness, fever and worsening pain in the past few days.  Now 4 days status post DKA.  Had temporary improvement of symptoms before worsening.  On exam his lungs are clear.  He has no respiratory symptoms.  He does endorse increased urinary frequency without dysuria.  Abdomen is nontender.  Left lower extremity with proximal thigh edema and tenderness, left knee is expectedly swollen, minimally tender, minimal warmth, no calf tenderness.  Ultrasound not available this evening. No CP or SOB to suggest PE.  He is additionally not tachypneic or hypoxic and has been compliant with his Eliquis.  Independent ECG interpretation:  none  Independent labs interpretation:  The following labs were independently interpreted:  CBC with leukocytosis 11.9 hemoglobin 10.7, lactic within normal limits, UA with low specific gravity otherwise no acute findings, CMP without significant findings, dimer 1.77  Independent visualization and interpretation of imaging: none  Treatment and Reassessment: Patient given morphine 4 mg following first assessment  Consultations obtained:   Ortho pending  Disposition:   Sign out given Evlyn Kanner, PA-C. Disposition pending workup. Please see his note remainder of visit.   Social Determinants of Health:   none  This note was dictated with voice recognition software.  Despite best efforts at proofreading, errors may have occurred which can change the documentation meaning.  Final Clinical Impression(s) / ED Diagnoses Final diagnoses:  Left leg pain  Dizziness    Rx / DC Orders ED Discharge Orders     None         Fabienne Bruns 02/01/24 0003    Zadie Rhine, MD 02/01/24  [phone]

## 2024-01-31 NOTE — ED Triage Notes (Signed)
 Pt had left knee replacement surgery x4 days ago. Called EMS today because he was dizzy & had a fever of 102; went to surgeon that completed surgery this morning but they just took him off pain medications. VSS Bp 148/84  Pt states dizziness comes & goes while sitting but is worse when he changes position.  Pt states he took 1000mg  of tylenol prior to EMS arrival.

## 2024-01-31 NOTE — ED Notes (Signed)
 Patients left thigh area bruised & warmer to the touch the right thigh.

## 2024-01-31 NOTE — Progress Notes (Signed)
 Post-Op Visit Note   Patient: Levi Alvarez           Date of Birth: [birth year]           MRN: [id number] Visit Date: 01/31/2024 PCP: Billie Lade, MD   Assessment & Plan:  Chief Complaint:  Chief Complaint  Patient presents with   Left Knee - Follow-up    Left total knee arthroplasty 01/27/2024   Visit Diagnoses:  1. Post-traumatic osteoarthritis of left knee   2. Status post total left knee replacement     Plan: Mr. Raul Del is status post left total knee arthroplasty on Monday.  He is about 4 days postop.  He reports that his range of motion and swelling have all gotten worse.  He feels that his thigh hurts.  He feels clammy and sweaty and nauseated and has poor appetite.  He has been taking oxycodone every 4 hours.  Exam of the left knee shows an intact surgical bandage without any abnormal findings.  He has no calf tenderness.  Negative Homans.  He is in no acute distress.  Nonlabored breathing.  Normal pulse rate.  Reassurance was provided that his leg looks like it should.  I think the issue is that he is taking too much opiate painkillers.  I would like him to wean that down ASAP and try to manage with OTC meds.  I advised that he should take opiate painkillers mainly just before physical therapy or when he goes to bed at night.  Will see him back as scheduled for his first postop check for suture removal.  Follow-Up Instructions: No follow-ups on file.   Orders:  No orders of the defined types were placed in this encounter.  No orders of the defined types were placed in this encounter.   Imaging: No results found.  PMFS History: Patient Active Problem List   Diagnosis Date Noted   Status post total left knee replacement 01/27/2024   GERD (gastroesophageal reflux disease) 12/04/2023   History of hepatitis C virus infection 12/04/2023   Erectile dysfunction 12/04/2023   Hypogonadism in male 12/04/2023   MDD (major depressive disorder) 12/04/2023   Insomnia  12/04/2023   Slow urinary stream 12/04/2023   Hyponatremia 01/23/2022   Alcohol withdrawal (HCC) 01/22/2022   Class 1 obesity 01/22/2022   Alcohol withdrawal syndrome without complication (HCC)    Alcohol dependence with withdrawal with complication (HCC) 04/20/2021   Marijuana abuse, continuous 04/20/2021   Pancreatitis, acute 04/10/2021   Hypotension 04/10/2021   Chest pain 04/10/2021   Esophageal varices without bleeding (HCC)    Abnormal CT scan, colon    Benign neoplasm of colon    Bleeding esophageal varices (HCC)    Spondylosis without myelopathy or radiculopathy, cervical region 12/03/2018   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   Post-traumatic osteoarthritis of left knee 12/03/2018   Chronic obstructive pulmonary disease (HCC) 12/02/2018   Upper GI bleed 11/24/2018   Acute blood loss anemia 11/24/2018   Alcohol abuse 11/24/2018   Chronic back pain 11/24/2018   Chronic hepatitis C without hepatic coma (HCC) 10/08/2017   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   Chronic pain of left knee 07/23/2017   Essential hypertension 07/12/2017   OSA (obstructive sleep apnea) 07/12/2017   Class 2 obesity due to excess calories with body mass index (BMI) of 35.0 to 35.9 in adult 07/12/2017   Elevated liver enzymes 07/12/2017   Other chronic pain 07/12/2017   Smoker 07/12/2017   Hyperlipidemia 07/12/2017  Polyarthralgia 07/12/2017   Snoring 06/05/2017   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   Constipation    Resolved   COPD (chronic obstructive pulmonary disease) (HCC)    DDD (degenerative disc disease), cervical    Diverticulosis    ED (erectile dysfunction)    Esophageal varices (HCC)    GERD (gastroesophageal reflux disease)    H/O: upper GI bleed    Hepatitis    Hepatitis C antibody positive in blood    resolved   High  blood pressure    History of anemia    Internal hemorrhoids    Left inguinal hernia 2020   Lung nodule    Small   Portal hypertensive gastropathy (HCC)    Sleep apnea    trying to get used to his CPAP, not wearing CPAP per SO [name] McAdoo   Umbilical hernia 2020    Family History  Problem Relation Age of Onset   Colitis Mother    Arthritis Mother    Dementia Mother    Cancer Father        lymphoma?   Cancer Paternal Grandfather        bone   Heart disease Neg Hx    Stroke Neg Hx    Diabetes Neg Hx    Colon cancer Neg Hx    Esophageal cancer Neg Hx    Stomach cancer Neg Hx    Rectal cancer Neg Hx     Past Surgical History:  Procedure Laterality Date   arm surgery     left arm,   COLONOSCOPY WITH PROPOFOL N/A 01/19/2019   Procedure: COLONOSCOPY WITH PROPOFOL;  Surgeon: Benancio Deeds, MD;  Location: [facility];  Service: Gastroenterology;  Laterality: N/A;   ESOPHAGEAL BANDING  11/25/2018   Procedure: ESOPHAGEAL BANDING;  Surgeon: Rachael Fee, MD;  Location: [facility];  Service: Endoscopy;;   ESOPHAGEAL BANDING  01/19/2019   Procedure: ESOPHAGEAL BANDING;  Surgeon: Benancio Deeds, MD;  Location: [facility];  Service: Gastroenterology;;   ESOPHAGEAL BANDING N/A 01/24/2021   Procedure: ESOPHAGEAL BANDING;  Surgeon: Benancio Deeds, MD;  Location: [facility];  Service: Gastroenterology;  Laterality: N/A;   ESOPHAGOGASTRODUODENOSCOPY (EGD) WITH PROPOFOL N/A 11/25/2018   Procedure: ESOPHAGOGASTRODUODENOSCOPY (EGD) WITH PROPOFOL;  Surgeon: Rachael Fee, MD;  Location: [facility];  Service: Endoscopy;  Laterality: N/A;   ESOPHAGOGASTRODUODENOSCOPY (EGD) WITH PROPOFOL N/A 01/19/2019   Procedure: ESOPHAGOGASTRODUODENOSCOPY (EGD) WITH PROPOFOL;  Surgeon: Benancio Deeds, MD;  Location: [facility];  Service: Gastroenterology;  Laterality: N/A;   ESOPHAGOGASTRODUODENOSCOPY (EGD) WITH PROPOFOL N/A 04/28/2019   Procedure: ESOPHAGOGASTRODUODENOSCOPY (EGD)  WITH PROPOFOL;  Surgeon: Benancio Deeds, MD;  Location: [facility];  Service: Gastroenterology;  Laterality: N/A;   ESOPHAGOGASTRODUODENOSCOPY (EGD) WITH PROPOFOL N/A 11/03/2019   Procedure: ESOPHAGOGASTRODUODENOSCOPY (EGD) WITH PROPOFOL;  Surgeon: Sherrilyn Rist, MD;  Location: [facility];  Service: Gastroenterology;  Laterality: N/A;   ESOPHAGOGASTRODUODENOSCOPY (EGD) WITH PROPOFOL N/A 01/24/2021   Procedure: ESOPHAGOGASTRODUODENOSCOPY (EGD) WITH PROPOFOL;  Surgeon: Benancio Deeds, MD;  Location: [facility];  Service: Gastroenterology;  Laterality: N/A;   KNEE SURGERY Left    POLYPECTOMY  01/19/2019   Procedure: POLYPECTOMY;  Surgeon: Benancio Deeds, MD;  Location: [facility];  Service: Gastroenterology;;   TOTAL KNEE ARTHROPLASTY Left 01/27/2024   Procedure: LEFT  TOTAL KNEE ARTHROPLASTY;  Surgeon: Tarry Kos, MD;  Location: [facility] OR;  Service: Orthopedics;  Laterality: Left;   Social History   Occupational History   Occupation: disabled  Tobacco Use   Smoking status: Every Day    Current packs/day: 0.50    Average packs/day: 0.5 packs/day for 30.0 years (15.0 ttl pk-yrs)    Types: Cigarettes    Passive exposure: Current   Smokeless tobacco: Former    Types: Engineer, drilling   Vaping status: Never Used  Substance and Sexual Activity   Alcohol use: Not Currently    Alcohol/week: 36.0 standard drinks of alcohol    Types: 36 Cans of beer per week    Comment: quit March 2023   Drug use: Yes    Types: Marijuana    Comment: daily use - informed to withhold 24-48 hrs prior to procedure   Sexual activity: Yes    Partners: Female

## 2024-01-31 NOTE — ED Notes (Signed)
 1st lac 1.0 in normal range, 2nd not needed can be canceled

## 2024-01-31 NOTE — ED Provider Notes (Incomplete)
 [place] EMERGENCY DEPARTMENT AT [facility] Provider Note   CSN: [id number] Arrival date & time: 01/31/24  2104     History {Add pertinent medical, surgical, social history, OB history to HPI:1} No chief complaint on file.   Levi MCDUFFEE is a 53 y.o. male status post left TKA on 3/17 presents with worsening left knee pain with intermittent dizziness and fevers for the past couple days.  Patient is now 4 days status post surgery.  He had 2 days of notable recovery was regaining significant range of motion and pain was well-controlled.  However in the past 2 days his pain has significantly worsened.  Reports he is urinating a lot without dysuria.  HPI    Past Medical History:  Diagnosis Date  . Alcoholism (HCC)   . Blood transfusion without reported diagnosis    jan, 2020 3 units PRBC  . 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 Constipation    Resolved  . COPD (chronic obstructive pulmonary disease) (HCC)   . DDD (degenerative disc disease), cervical   . Diverticulosis   . ED (erectile dysfunction)   . Esophageal varices (HCC)   . GERD (gastroesophageal reflux disease)   . H/O: upper GI bleed   . Hepatitis   . Hepatitis C antibody positive in blood    resolved  . High blood pressure   . History of anemia   . Internal hemorrhoids   . Left inguinal hernia 2020  . Lung nodule    Small  . Portal hypertensive gastropathy (HCC)   . Sleep apnea    trying to get used to his CPAP, not wearing CPAP per [facility]  . Umbilical hernia 2020     Home Medications Prior to Admission medications   Medication Sig Start Date End Date Taking? Authorizing Provider  doxycycline (VIBRAMYCIN) 100 MG capsule Take 1 capsule (100 mg total) by mouth 2 (two) times daily. To be taken after surgery 01/09/24   Cristie Hem, PA-C  apixaban (ELIQUIS) 2.5 MG TABS tablet Take 1 tablet (2.5 mg total) by mouth 2 (two) times daily. To be taken after surgery to prevent  blood clots 01/28/24   Cristie Hem, PA-C  carvedilol (COREG) 6.25 MG tablet Take 1 tablet (6.25 mg total) by mouth 2 (two) times daily with a meal. 06/18/22   Edsel Petrin, DO  docusate sodium (COLACE) 100 MG capsule Take 1 capsule (100 mg total) by mouth daily as needed. 01/09/24 01/08/25  Cristie Hem, PA-C  folic acid (FOLVITE) 1 MG tablet Take 1 mg by mouth in the morning.    [provider]  loratadine (CLARITIN) 10 MG tablet Take 10 mg by mouth daily.    [provider]  Magnesium 250 MG CAPS Take 250 mg by mouth daily.    [provider]  methocarbamol (ROBAXIN-750) 750 MG tablet Take 1 tablet (750 mg total) by mouth 3 (three) times daily as needed for muscle spasms. 01/09/24   Cristie Hem, PA-C  MILK THISTLE PO Take 1 capsule by mouth 2 (two) times daily.    [provider]  Multiple Vitamin (MULTIVITAMIN WITH MINERALS) TABS tablet Take 1 tablet by mouth daily with breakfast.    [provider]  ondansetron (ZOFRAN) 4 MG tablet Take 1 tablet (4 mg total) by mouth every 8 (eight) hours as needed for nausea or vomiting. 01/09/24   Cristie Hem, PA-C  oxyCODONE-acetaminophen (PERCOCET)  5-325 MG tablet Take 1-2 tablets by mouth every 6 (six) hours as needed. To be taken after surgery 01/09/24   Cristie Hem, PA-C  pantoprazole (PROTONIX) 40 MG tablet Take 1 tablet (40 mg total) by mouth daily. 12/16/23   Billie Lade, MD  sildenafil (VIAGRA) 100 MG tablet 1/2-1 tab po prn 06/24/23   Marcine Matar, MD  tamsulosin (FLOMAX) 0.4 MG CAPS capsule Take 1 capsule (0.4 mg total) by mouth daily. 05/18/22   Vaillancourt, Lelon Mast, PA-C  testosterone cypionate (DEPOTESTOSTERONE CYPIONATE) 200 MG/ML injection Inject 100 mg into the skin once a week. 11/02/23   [provider]  Thiamine HCl (VITAMIN B1) 100 MG TABS Take 1 tablet (100 mg total) by mouth in the morning. 01/29/22   Edsel Petrin, DO  traZODone (DESYREL) 50 MG  tablet Take 1 tablet (50 mg total) by mouth at bedtime. For sleep 05/31/22   Edsel Petrin, DO  venlafaxine XR (EFFEXOR-XR) 75 MG 24 hr capsule TAKE 1 CAPSULE BY MOUTH DAILY. 08/15/20   Hazel Sams, NP  VENTOLIN HFA 108 (90 Base) MCG/ACT inhaler INHALE 1 PUFF EVERY 6 (SIX) HOURS AS NEEDED INTO THE LUNGS FOR WHEEZING OR SHORTNESS OF BREATH. 05/19/20   Hoy Register, MD      Allergies    Onion, Gabapentin, and Naproxen    Review of Systems   Review of Systems  Musculoskeletal:  Positive for myalgias.    Physical Exam Updated Vital Signs Ht 5\' 9"  (1.753 m)   Wt 95.3 kg   SpO2 100%   BMI 31.01 kg/m  Physical Exam Vitals and nursing note reviewed.  Constitutional:      General: He is not in acute distress.    Appearance: He is well-developed.  HENT:     Head: Normocephalic and atraumatic.  Eyes:     Conjunctiva/sclera: Conjunctivae normal.  Cardiovascular:     Rate and Rhythm: Normal rate and regular rhythm.     Heart sounds: No murmur heard. Pulmonary:     Effort: Pulmonary effort is normal. No respiratory distress.     Breath sounds: Normal breath sounds.  Abdominal:     Palpations: Abdomen is soft.     Tenderness: There is no abdominal tenderness.  Musculoskeletal:        General: No swelling.     Cervical back: Neck supple.     Comments: Left knee incision without dehiscence or drainage.  There is no overlying erythema or fluctuance.  Knee does appear expectantly swollen, with minimal warmth.  Range of motion is limited due to pain, DP pulses symmetric.  NVI.  There is proximal thigh ecchymosis that is tender to palpation  Skin:    General: Skin is warm and dry.     Capillary Refill: Capillary refill takes less than 2 seconds.  Neurological:     Mental Status: He is alert.     Comments: Patient is alert and oriented. There is no abnormal phonation. Symmetric smile without facial droop.  Moves all extremities spontaneously. 5/5 strength in upper and lower  extremities.  No sensation deficit. There is no nystagmus. EOMI, PERRL. Coordination intact with finger to nose .    Psychiatric:        Mood and Affect: Mood normal.     ED Results / Procedures / Treatments   Labs (all labs ordered are listed, but only abnormal results are displayed) Labs Reviewed - No data to display  EKG None  Radiology No results found.  Procedures  Procedures  {Document cardiac monitor, telemetry assessment procedure when appropriate:1}  Medications Ordered in ED Medications - No data to display  ED Course/ Medical Decision Making/ A&P   {   Click here for ABCD2, HEART and other calculatorsREFRESH Note before signing :1}                              Medical Decision Making  This patient presents to the ED with chief complaint(s) of leg pain .  The complaint involves an extensive differential diagnosis and also carries with it a high risk of complications and morbidity.   pertinent past medical history as listed in HPI  The differential diagnosis includes  Septic joint, gout, sepsis, DVT, postoperative pain, UTI, pneumonia  Additional history obtained: Additional history obtained from family Records reviewed Care Everywhere/External Records  Initial Assessment:   Hemodynamically stable, afebrile, nontoxic-appearing patient presenting with intermittent dizziness, fever and worsening pain in the past few days.  Now 4 days status post DKA.  Had temporary improvement of symptoms before worsening.  On exam his lungs are clear.  He has no respiratory symptoms.  He does endorse increased urinary frequency without dysuria.  Abdomen is nontender.  Left lower extremity with proximal thigh edema and tenderness, left knee is expectedly swollen, minimally tender, minimal warmth, no calf tenderness.  Ultrasound not available this evening.  Independent ECG interpretation:  none  Independent labs interpretation:  The following labs were independently interpreted:   CBC with leukocytosis 11.9 hemoglobin 10.7, lactic within normal limits, UA with low specific gravity otherwise no acute findings  Independent visualization and interpretation of imaging: I independently visualized the following imaging with scope of interpretation limited to determining acute life threatening conditions related to emergency care: ***, which revealed ***  Treatment and Reassessment: Patient given morphine 4 mg following first assessment  Consultations obtained:   ***  Disposition:   ***  Social Determinants of Health:   none  This note was dictated with voice recognition software.  Despite best efforts at proofreading, errors may have occurred which can change the documentation meaning.    {Document critical care time when appropriate:1} {Document review of labs and clinical decision tools ie heart score, Chads2Vasc2 etc:1}  {Document your independent review of radiology images, and any outside records:1} {Document your discussion with family members, caretakers, and with consultants:1} {Document social determinants of health affecting pt's care:1} {Document your decision making why or why not admission, treatments were needed:1} Final Clinical Impression(s) / ED Diagnoses Final diagnoses:  None    Rx / DC Orders ED Discharge Orders     None

## 2024-01-31 NOTE — Discharge Instructions (Addendum)
 Please follow-up in the morning to have an ultrasound done to evaluate for possible blood clot.  Please continue taking your Eliquis as prescribed.  Today your labs are reassuring however we do not have ultrasound at this time so you will need to come back to have this evaluated.  If symptoms change or worsen please return to the ER.  Please follow-up with the orthopedist as well.  We have also placed an order for ultrasound to be done at [facility] as this is closer to your home.

## 2024-02-01 ENCOUNTER — Emergency Department (HOSPITAL_COMMUNITY)

## 2024-02-01 ENCOUNTER — Ambulatory Visit (HOSPITAL_COMMUNITY): Admission: RE | Admit: 2024-02-01 | Source: Ambulatory Visit

## 2024-02-01 ENCOUNTER — Emergency Department (HOSPITAL_COMMUNITY)
Admission: EM | Admit: 2024-02-01 | Discharge: 2024-02-01 | Disposition: A | Discharge status: Home / Self Care | Source: Home / Self Care | Attending: Student | Admitting: Student

## 2024-02-01 ENCOUNTER — Other Ambulatory Visit: Payer: Self-pay

## 2024-02-01 ENCOUNTER — Encounter (HOSPITAL_COMMUNITY): Payer: Self-pay | Admitting: *Deleted

## 2024-02-01 DIAGNOSIS — J449 Chronic obstructive pulmonary disease, unspecified: Secondary | ICD-10-CM | POA: Insufficient documentation

## 2024-02-01 DIAGNOSIS — D72829 Elevated white blood cell count, unspecified: Secondary | ICD-10-CM | POA: Insufficient documentation

## 2024-02-01 DIAGNOSIS — I10 Essential (primary) hypertension: Secondary | ICD-10-CM | POA: Insufficient documentation

## 2024-02-01 DIAGNOSIS — M96840 Postprocedural hematoma of a musculoskeletal structure following a musculoskeletal system procedure: Secondary | ICD-10-CM | POA: Insufficient documentation

## 2024-02-01 DIAGNOSIS — G8918 Other acute postprocedural pain: Secondary | ICD-10-CM

## 2024-02-01 DIAGNOSIS — F17210 Nicotine dependence, cigarettes, uncomplicated: Secondary | ICD-10-CM | POA: Insufficient documentation

## 2024-02-01 DIAGNOSIS — Z96652 Presence of left artificial knee joint: Secondary | ICD-10-CM | POA: Insufficient documentation

## 2024-02-01 DIAGNOSIS — Z7901 Long term (current) use of anticoagulants: Secondary | ICD-10-CM | POA: Insufficient documentation

## 2024-02-01 LAB — COMPREHENSIVE METABOLIC PANEL
ALT: 27 U/L (ref 0–44)
AST: 26 U/L (ref 15–41)
Albumin: 3.7 g/dL (ref 3.5–5.0)
Alkaline Phosphatase: 72 U/L (ref 38–126)
Anion gap: 12 (ref 5–15)
BUN: 16 mg/dL (ref 6–20)
CO2: 25 mmol/L (ref 22–32)
Calcium: 9.6 mg/dL (ref 8.9–10.3)
Chloride: 95 mmol/L — ABNORMAL LOW (ref 98–111)
Creatinine, Ser: 0.78 mg/dL (ref 0.61–1.24)
GFR, Estimated: >60 mL/min (ref >60)
Glucose, Bld: 120 mg/dL — ABNORMAL HIGH (ref 70–99)
Potassium: 4.5 mmol/L (ref 3.5–5.1)
Sodium: 132 mmol/L — ABNORMAL LOW (ref 135–145)
Total Bilirubin: 1 mg/dL (ref 0.0–1.2)
Total Protein: 7.4 g/dL (ref 6.5–8.1)

## 2024-02-01 LAB — CBC WITH DIFFERENTIAL/PLATELET
Abs Immature Granulocytes: 0.06 10*3/uL (ref 0.00–0.07)
Basophils Absolute: 0 10*3/uL (ref 0.0–0.1)
Basophils Relative: 0 %
Eosinophils Absolute: 0.1 10*3/uL (ref 0.0–0.5)
Eosinophils Relative: 0 %
HCT: 31.8 % — ABNORMAL LOW (ref 39.0–52.0)
Hemoglobin: 10.4 g/dL — ABNORMAL LOW (ref 13.0–17.0)
Immature Granulocytes: 0 %
Lymphocytes Relative: 17 %
Lymphs Abs: 2.4 10*3/uL (ref 0.7–4.0)
MCH: 26.7 pg (ref 26.0–34.0)
MCHC: 32.7 g/dL (ref 30.0–36.0)
MCV: 81.7 fL (ref 80.0–100.0)
Monocytes Absolute: 1.6 10*3/uL — ABNORMAL HIGH (ref 0.1–1.0)
Monocytes Relative: 12 %
Neutro Abs: 9.6 10*3/uL — ABNORMAL HIGH (ref 1.7–7.7)
Neutrophils Relative %: 71 %
Platelets: 152 10*3/uL (ref 150–400)
RBC: 3.89 MIL/uL — ABNORMAL LOW (ref 4.22–5.81)
RDW: 17.9 % — ABNORMAL HIGH (ref 11.5–15.5)
WBC: 13.7 10*3/uL — ABNORMAL HIGH (ref 4.0–10.5)
nRBC: 0 % (ref 0.0–0.2)

## 2024-02-01 LAB — RESP PANEL BY RT-PCR (RSV, FLU A&B, COVID)  RVPGX2
Influenza A by PCR: NEGATIVE
Influenza B by PCR: NEGATIVE
Resp Syncytial Virus by PCR: NEGATIVE
SARS Coronavirus 2 by RT PCR: NEGATIVE

## 2024-02-01 LAB — TROPONIN I (HIGH SENSITIVITY)
Troponin I (High Sensitivity): 3 ng/L (ref <18)
Troponin I (High Sensitivity): 3 ng/L (ref <18)

## 2024-02-01 LAB — I-STAT CG4 LACTIC ACID, ED: Lactic Acid, Venous: 1 mmol/L (ref 0.5–1.9)

## 2024-02-01 LAB — LACTIC ACID, PLASMA: Lactic Acid, Venous: 1.1 mmol/L (ref 0.5–1.9)

## 2024-02-01 MED ORDER — MORPHINE SULFATE (PF) 4 MG/ML IV SOLN
4.0000 mg | Freq: Once | INTRAVENOUS | Status: AC
  Administered 2024-02-01: 4 mg via INTRAVENOUS
  Filled 2024-02-01: qty 1

## 2024-02-01 MED ORDER — LIDOCAINE 4 % EX PTCH: 1.0000 | MEDICATED_PATCH | Freq: Two times a day (BID) | CUTANEOUS | 0 refills | Status: DC

## 2024-02-01 MED ORDER — IOHEXOL 300 MG/ML  SOLN
75.0000 mL | Freq: Once | INTRAMUSCULAR | Status: AC | PRN
  Administered 2024-02-01: 75 mL via INTRAVENOUS

## 2024-02-01 MED ORDER — HYDROCODONE-ACETAMINOPHEN 5-325 MG PO TABS
1.0000 | ORAL_TABLET | Freq: Once | ORAL | Status: AC
  Administered 2024-02-01: 1 via ORAL
  Filled 2024-02-01: qty 1

## 2024-02-01 MED ORDER — DICLOFENAC SODIUM 1 % EX GEL: 4.0000 g | Freq: Four times a day (QID) | CUTANEOUS | 0 refills | Status: DC

## 2024-02-01 MED ORDER — IOHEXOL 350 MG/ML SOLN
75.0000 mL | Freq: Once | INTRAVENOUS | Status: AC | PRN
  Administered 2024-02-01: 75 mL via INTRAVENOUS

## 2024-02-01 MED ORDER — ONDANSETRON HCL 4 MG/2ML IJ SOLN
4.0000 mg | Freq: Once | INTRAMUSCULAR | Status: AC
  Administered 2024-02-01: 4 mg via INTRAVENOUS
  Filled 2024-02-01: qty 2

## 2024-02-01 MED ORDER — LIDOCAINE 5 % EX PTCH
1.0000 | MEDICATED_PATCH | CUTANEOUS | Status: DC
  Administered 2024-02-01: 1 via TRANSDERMAL
  Filled 2024-02-01: qty 1

## 2024-02-01 MED ORDER — ACETAMINOPHEN 500 MG PO TABS: 1000.0000 mg | ORAL_TABLET | Freq: Three times a day (TID) | ORAL | 0 refills | Status: AC

## 2024-02-01 NOTE — ED Notes (Signed)
 Applied 9cm aquacel to pt's left knee incision. Tolerated well.

## 2024-02-01 NOTE — ED Provider Notes (Signed)
 [place] EMERGENCY DEPARTMENT AT [facility] Provider Note  CSN: [id number] Arrival date & time: 02/01/24 1522  Chief Complaint(s) Leg Pain  HPI Levi Alvarez is a 53 y.o. male postop day 6 from a left TKA, alcoholism, hep C cirrhosis, anticoagulated on Eliquis, esophageal varices who presents emergency room for evaluation of leg pain.  Patient was seen in the outpatient setting by his primary orthopedist Dr. Roda Shutters but subsequently went to the emergency department for persistent leg pain and associated nausea and presyncope.  Extensive workup performed last night and they were unable to perform a DVT ultrasound given the hour.  They represent to the emergency room today with hopes to receive an ultrasound with persistent leg pain.  Denies numbness, tingling, weakness of lower extremity.  Denies chest pain, shortness of breath, abdominal pain, vomiting or other systemic symptoms.   Past Medical History 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   Constipation    Resolved   COPD (chronic obstructive pulmonary disease) (HCC)    DDD (degenerative disc disease), cervical    Diverticulosis    ED (erectile dysfunction)    Esophageal varices (HCC)    GERD (gastroesophageal reflux disease)    H/O: upper GI bleed    Hepatitis    Hepatitis C antibody positive in blood    resolved   High blood pressure    History of anemia    Internal hemorrhoids    Left inguinal hernia 2020   Lung nodule    Small   Portal hypertensive gastropathy (HCC)    Sleep apnea    trying to get used to his CPAP, not wearing CPAP per SO [name] McAdoo   Umbilical hernia 2020   Patient Active Problem List   Diagnosis Date Noted   Status post total left knee replacement 01/27/2024   GERD (gastroesophageal reflux disease) 12/04/2023   History of hepatitis C virus  infection 12/04/2023   Erectile dysfunction 12/04/2023   Hypogonadism in male 12/04/2023   MDD (major depressive disorder) 12/04/2023   Insomnia 12/04/2023   Slow urinary stream 12/04/2023   Hyponatremia 01/23/2022   Alcohol withdrawal (HCC) 01/22/2022   Class 1 obesity 01/22/2022   Alcohol withdrawal syndrome without complication (HCC)    Alcohol dependence with withdrawal with complication (HCC) 04/20/2021   Marijuana abuse, continuous 04/20/2021   Pancreatitis, acute 04/10/2021   Hypotension 04/10/2021   Chest pain 04/10/2021   Esophageal varices without bleeding (HCC)    Abnormal CT scan, colon    Benign neoplasm of colon    Bleeding esophageal varices (HCC)    Spondylosis without myelopathy or radiculopathy, cervical region 12/03/2018   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   Post-traumatic osteoarthritis of left knee 12/03/2018   Chronic obstructive pulmonary disease (HCC) 12/02/2018   Upper GI bleed 11/24/2018   Acute blood loss anemia 11/24/2018   Alcohol abuse 11/24/2018   Chronic back pain 11/24/2018   Chronic hepatitis C without hepatic coma (HCC) 10/08/2017   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   Chronic pain of left knee 07/23/2017   Essential hypertension 07/12/2017   OSA (obstructive sleep apnea) 07/12/2017   Class 2 obesity due to excess calories with body mass index (BMI) of 35.0 to 35.9 in adult 07/12/2017   Elevated liver enzymes 07/12/2017   Other chronic pain 07/12/2017  Smoker 07/12/2017   Hyperlipidemia 07/12/2017   Polyarthralgia 07/12/2017   Snoring 06/05/2017   Home Medication(s) Prior to Admission medications   Medication Sig Start Date End Date Taking? Authorizing Provider  doxycycline (VIBRAMYCIN) 100 MG capsule Take 1 capsule (100 mg total) by mouth 2 (two) times daily. To be taken after surgery 01/09/24   Cristie Hem, PA-C  apixaban (ELIQUIS) 2.5 MG TABS tablet Take 1 tablet (2.5 mg total) by mouth 2 (two) times daily. To be taken after  surgery to prevent blood clots 01/28/24   Cristie Hem, PA-C  carvedilol (COREG) 6.25 MG tablet Take 1 tablet (6.25 mg total) by mouth 2 (two) times daily with a meal. 06/18/22   Edsel Petrin, DO  docusate sodium (COLACE) 100 MG capsule Take 1 capsule (100 mg total) by mouth daily as needed. 01/09/24 01/08/25  Cristie Hem, PA-C  folic acid (FOLVITE) 1 MG tablet Take 1 mg by mouth in the morning.    [provider]  loratadine (CLARITIN) 10 MG tablet Take 10 mg by mouth daily.    [provider]  Magnesium 250 MG CAPS Take 250 mg by mouth daily.    [provider]  methocarbamol (ROBAXIN-750) 750 MG tablet Take 1 tablet (750 mg total) by mouth 3 (three) times daily as needed for muscle spasms. 01/09/24   Cristie Hem, PA-C  MILK THISTLE PO Take 1 capsule by mouth 2 (two) times daily.    [provider]  Multiple Vitamin (MULTIVITAMIN WITH MINERALS) TABS tablet Take 1 tablet by mouth daily with breakfast.    [provider]  ondansetron (ZOFRAN) 4 MG tablet Take 1 tablet (4 mg total) by mouth every 8 (eight) hours as needed for nausea or vomiting. 01/09/24   Cristie Hem, PA-C  oxyCODONE-acetaminophen (PERCOCET) 5-325 MG tablet Take 1-2 tablets by mouth every 6 (six) hours as needed. To be taken after surgery 01/09/24   Cristie Hem, PA-C  pantoprazole (PROTONIX) 40 MG tablet Take 1 tablet (40 mg total) by mouth daily. 12/16/23   Billie Lade, MD  sildenafil (VIAGRA) 100 MG tablet 1/2-1 tab po prn 06/24/23   Marcine Matar, MD  tamsulosin (FLOMAX) 0.4 MG CAPS capsule Take 1 capsule (0.4 mg total) by mouth daily. 05/18/22   Vaillancourt, Lelon Mast, PA-C  testosterone cypionate (DEPOTESTOSTERONE CYPIONATE) 200 MG/ML injection Inject 100 mg into the skin once a week. 11/02/23   [provider]  Thiamine HCl (VITAMIN B1) 100 MG TABS Take 1 tablet (100 mg total) by mouth in the morning. 01/29/22   Edsel Petrin, DO  traZODone  (DESYREL) 50 MG tablet Take 1 tablet (50 mg total) by mouth at bedtime. For sleep 05/31/22   Edsel Petrin, DO  venlafaxine XR (EFFEXOR-XR) 75 MG 24 hr capsule TAKE 1 CAPSULE BY MOUTH DAILY. 08/15/20   Hazel Sams, NP  VENTOLIN HFA 108 (90 Base) MCG/ACT inhaler INHALE 1 PUFF EVERY 6 (SIX) HOURS AS NEEDED INTO THE LUNGS FOR WHEEZING OR SHORTNESS OF BREATH. 05/19/20   Hoy Register, MD  Past Surgical History Past Surgical History:  Procedure Laterality Date   arm surgery     left arm,   COLONOSCOPY WITH PROPOFOL N/A 01/19/2019   Procedure: COLONOSCOPY WITH PROPOFOL;  Surgeon: Benancio Deeds, MD;  Location: [facility];  Service: Gastroenterology;  Laterality: N/A;   ESOPHAGEAL BANDING  11/25/2018   Procedure: ESOPHAGEAL BANDING;  Surgeon: Rachael Fee, MD;  Location: [facility];  Service: Endoscopy;;   ESOPHAGEAL BANDING  01/19/2019   Procedure: ESOPHAGEAL BANDING;  Surgeon: Benancio Deeds, MD;  Location: [facility];  Service: Gastroenterology;;   ESOPHAGEAL BANDING N/A 01/24/2021   Procedure: ESOPHAGEAL BANDING;  Surgeon: Benancio Deeds, MD;  Location: [facility];  Service: Gastroenterology;  Laterality: N/A;   ESOPHAGOGASTRODUODENOSCOPY (EGD) WITH PROPOFOL N/A 11/25/2018   Procedure: ESOPHAGOGASTRODUODENOSCOPY (EGD) WITH PROPOFOL;  Surgeon: Rachael Fee, MD;  Location: [facility];  Service: Endoscopy;  Laterality: N/A;   ESOPHAGOGASTRODUODENOSCOPY (EGD) WITH PROPOFOL N/A 01/19/2019   Procedure: ESOPHAGOGASTRODUODENOSCOPY (EGD) WITH PROPOFOL;  Surgeon: Benancio Deeds, MD;  Location: [facility];  Service: Gastroenterology;  Laterality: N/A;   ESOPHAGOGASTRODUODENOSCOPY (EGD) WITH PROPOFOL N/A 04/28/2019   Procedure: ESOPHAGOGASTRODUODENOSCOPY (EGD) WITH PROPOFOL;  Surgeon: Benancio Deeds, MD;  Location: [facility];   Service: Gastroenterology;  Laterality: N/A;   ESOPHAGOGASTRODUODENOSCOPY (EGD) WITH PROPOFOL N/A 11/03/2019   Procedure: ESOPHAGOGASTRODUODENOSCOPY (EGD) WITH PROPOFOL;  Surgeon: Sherrilyn Rist, MD;  Location: [facility];  Service: Gastroenterology;  Laterality: N/A;   ESOPHAGOGASTRODUODENOSCOPY (EGD) WITH PROPOFOL N/A 01/24/2021   Procedure: ESOPHAGOGASTRODUODENOSCOPY (EGD) WITH PROPOFOL;  Surgeon: Benancio Deeds, MD;  Location: [facility];  Service: Gastroenterology;  Laterality: N/A;   KNEE SURGERY Left    POLYPECTOMY  01/19/2019   Procedure: POLYPECTOMY;  Surgeon: Benancio Deeds, MD;  Location: [facility];  Service: Gastroenterology;;   TOTAL KNEE ARTHROPLASTY Left 01/27/2024   Procedure: LEFT TOTAL KNEE ARTHROPLASTY;  Surgeon: Tarry Kos, MD;  Location: [facility];  Service: Orthopedics;  Laterality: Left;   Family History Family History  Problem Relation Age of Onset   Colitis Mother    Arthritis Mother    Dementia Mother    Cancer Father        lymphoma?   Cancer Paternal Grandfather        bone   Heart disease Neg Hx    Stroke Neg Hx    Diabetes Neg Hx    Colon cancer Neg Hx    Esophageal cancer Neg Hx    Stomach cancer Neg Hx    Rectal cancer Neg Hx     Social History Social History   Tobacco Use   Smoking status: Every Day    Current packs/day: 0.50    Average packs/day: 0.5 packs/day for 30.0 years (15.0 ttl pk-yrs)    Types: Cigarettes    Passive exposure: Current   Smokeless tobacco: Former    Types: Engineer, drilling   Vaping status: Never Used  Substance Use Topics   Alcohol use: Not Currently    Alcohol/week: 36.0 standard drinks of alcohol    Types: 36 Cans of beer per week    Comment: quit March 2023   Drug use: Yes    Types: Marijuana    Comment: daily use - informed to withhold 24-48 hrs prior to procedure   Allergies Onion, Gabapentin, and Naproxen  Review of Systems Review of Systems  Musculoskeletal:  Positive for  arthralgias and myalgias.    Physical Exam Vital Signs  I have reviewed the  triage vital signs BP 138/82 (BP Location: Right Arm)   Pulse 70   Temp 99.2 F (37.3 C) (Oral)   Resp 18   Wt 95.3 kg   SpO2 98%   BMI 31.01 kg/m   Physical Exam Constitutional:      General: He is not in acute distress.    Appearance: Normal appearance.  HENT:     Head: Normocephalic and atraumatic.     Nose: No congestion or rhinorrhea.  Eyes:     General:        Right eye: No discharge.        Left eye: No discharge.     Extraocular Movements: Extraocular movements intact.     Pupils: Pupils are equal, round, and reactive to light.  Cardiovascular:     Rate and Rhythm: Normal rate and regular rhythm.     Heart sounds: No murmur heard. Pulmonary:     Effort: No respiratory distress.     Breath sounds: No wheezing or rales.  Abdominal:     General: There is no distension.     Tenderness: There is no abdominal tenderness.  Musculoskeletal:        General: Tenderness present. Normal range of motion.     Cervical back: Normal range of motion.  Skin:    General: Skin is warm and dry.     Findings: Bruising present.  Neurological:     General: No focal deficit present.     Mental Status: He is alert.     ED Results and Treatments Labs (all labs ordered are listed, but only abnormal results are displayed) Labs Reviewed  CBC WITH DIFFERENTIAL/PLATELET - Abnormal; Notable for the following components:      Result Value   WBC 13.7 (*)    RBC 3.89 (*)    Hemoglobin 10.4 (*)    HCT 31.8 (*)    RDW 17.9 (*)    Neutro Abs 9.6 (*)    Monocytes Absolute 1.6 (*)    All other components within normal limits  COMPREHENSIVE METABOLIC PANEL - Abnormal; Notable for the following components:   Sodium 132 (*)    Chloride 95 (*)    Glucose, Bld 120 (*)    All other components within normal limits  CULTURE, BLOOD (ROUTINE X 2)  CULTURE, BLOOD (ROUTINE X 2)  LACTIC ACID, PLASMA  LACTIC ACID,  PLASMA                                                                                                                          Radiology CT FEMUR LEFT W CONTRAST Result Date: 02/01/2024 CLINICAL DATA:  Left upper medial thigh pain post knee surgery EXAM: CT OF THE LOWER LEFT EXTREMITY WITH CONTRAST TECHNIQUE: Multidetector CT imaging of the lower left extremity was performed according to the standard protocol following intravenous contrast administration. RADIATION DOSE REDUCTION: This exam was performed according to the departmental dose-optimization program which includes automated exposure control, adjustment of  the mA and/or kV according to patient size and/or use of iterative reconstruction technique. CONTRAST:  75mL OMNIPAQUE IOHEXOL 300 MG/ML  SOLN COMPARISON:  Radiograph 01/27/2024 FINDINGS: Bones/Joint/Cartilage No acute fracture or malalignment. Ghost tract within the distal shaft of the femur. Interim removal fixating hardware from the medial and lateral femoral condyles with bony defects present. Retained screw centrally within the distal femur. Removal of tunneled screw from the medial aspect of the proximal tibia with evidence of prior tunnel defect. Patient is status post knee replacement with intact appearing hardware. Remnant fixating hardware at the fibular head and medial side of the proximal tibia. Small amounts of gas within the femoral shaft. Moderate gas and small amount of knee effusion in the suprapatellar joint space. Ligaments Suboptimally assessed by CT. Muscles and Tendons Moderate subcutaneous edema, greatest over the anterior and medial aspect of the thigh. Diffuse soft tissue stranding involving the anterior thigh muscles. Diffuse low-density appearance of the vastus intermedius muscle with intramuscular gas and fluid collection, this measures about 3.2 by 2 cm AP by transverse and extends over a 10 cm craniocaudal length. No significant rim enhancement. Probably communicates  with gas and fluid at the suprapatellar joint space. Soft tissues Stranding within the soft tissues of the left groin with a few mildly prominent groin lymph nodes. No rim enhancing fluid collections to suggest organized abscess. Normal vascular enhancement within the left groin and imaged thigh vessels. Small skin defects/wound at the suprapatellar anterior distal thigh. Moderate subcutaneous fluid anterior to the patella without definite rim enhancement. IMPRESSION: 1. Status post knee replacement with intact appearing hardware. Interim removal of fixating hardware from the medial and lateral femoral condyles with bony defects present. Removal of tunneled screw from the medial aspect of the proximal tibia. 2. Moderate gas and small amount of knee effusion in the suprapatellar joint space. Diffuse low-density appearance of the vastus intermedius muscle with intramuscular gas and fluid collection, presumably due to postoperative muscle edema, gas and fluid from recent surgery/intervention. No significant rim enhancement to suggest organized abscess at this time, close clinical follow-up recommended 3. Generalized subcutaneous edema within the thigh with diffuse stranding and edema involving the anterior hip and left thigh muscles, presumably postoperative in nature. Moderate fluid within the subcutaneous soft tissues anterior to the knee. No definite rim enhancing fluid collections to suggest soft tissue abscess at this time. Soft tissue stranding extends to the left groin where there are a few nonspecific mildly prominent lymph nodes. Electronically Signed   By: Jasmine Pang M.D.   On: 02/01/2024 17:43   CT Angio Chest PE W and/or Wo Contrast Result Date: 02/01/2024 CLINICAL DATA:  Positive D-dimer and chest pain, initial encounter EXAM: CT ANGIOGRAPHY CHEST WITH CONTRAST TECHNIQUE: Multidetector CT imaging of the chest was performed using the standard protocol during bolus administration of intravenous  contrast. Multiplanar CT image reconstructions and MIPs were obtained to evaluate the vascular anatomy. RADIATION DOSE REDUCTION: This exam was performed according to the departmental dose-optimization program which includes automated exposure control, adjustment of the mA and/or kV according to patient size and/or use of iterative reconstruction technique. CONTRAST:  75mL OMNIPAQUE IOHEXOL 350 MG/ML SOLN COMPARISON:  12/09/2017 FINDINGS: Cardiovascular: Thoracic aorta shows no aneurysmal dilatation or dissection. No cardiac enlargement is seen. Pulmonary artery shows a normal branching pattern bilaterally. No definitive pulmonary emboli are seen. Mediastinum/Nodes: Thoracic inlet is within normal limits. No hilar or mediastinal adenopathy is noted. The esophagus as visualized is within normal limits. Lungs/Pleura: Lungs are  well aerated bilaterally. No focal infiltrate or effusion is noted. Stable nodule is noted in the right upper lobe measuring 6 mm. This is best seen on image number 70 of series 6. Given its long-term stability it is felt to be benign in etiology and no further follow-up is recommended. No other nodularity is noted. Upper Abdomen: Visualized upper abdomen demonstrates some nodularity to the liver which has progressed in the interval from the prior exam. Musculoskeletal: Degenerative changes of the thoracic spine are seen. Review of the MIP images confirms the above findings. IMPRESSION: No evidence of pulmonary emboli. Stable 6 mm right upper lobe nodule. Given its long-term stability no further follow-up is recommended. Nodularity of the liver consistent with underlying cirrhosis. Electronically Signed   By: Alcide Clever M.D.   On: 02/01/2024 03:58    Pertinent labs & imaging results that were available during my care of the patient were reviewed by me and considered in my medical decision making (see MDM for details).  Medications Ordered in ED Medications  lidocaine (LIDODERM) 5 % 1  patch (1 patch Transdermal Patch Applied 02/01/24 1813)  morphine (PF) 4 MG/ML injection 4 mg (4 mg Intravenous Given 02/01/24 1612)  ondansetron (ZOFRAN) injection 4 mg (4 mg Intravenous Given 02/01/24 1611)  iohexol (OMNIPAQUE) 300 MG/ML solution 75 mL (75 mLs Intravenous Contrast Given 02/01/24 1630)  HYDROcodone-acetaminophen (NORCO/VICODIN) 5-325 MG per tablet 1 tablet (1 tablet Oral Given 02/01/24 1813)                                                                                                                                     Procedures Procedures  (including critical care time)  Medical Decision Making / ED Course   This patient presents to the ED for concern of leg pain, this involves an extensive number of treatment options, and is a complaint that carries with it a high risk of complications and morbidity.  The differential diagnosis includes postoperative pain, compartment syndrome, vascular injury from tourniquet, hematoma, postoperative infection, DVT  MDM: Seen emergency room for evaluation of leg pain.  Physical exam with bruising along the distribution of tourniquet placement in the proximal thigh on the left but surgical site is clean dry and intact with no evidence of purulent drainage.  Laboratory evaluation with a leukocytosis of 13.7, hemoglobin 10.4 but is otherwise unremarkable.  Lactic acid is normal.  Patient is afebrile and has no SIRS positive vital signs.  Given anticoagulated status and surrounding bruising around area of maximal pain, a CT femur was obtained to rule out deep space hematoma that shows multiple postoperative findings but no acute findings in the leg.  We did not have ultrasound availability here over the weekend at [facility] but I do have overall lower suspicion for deep space blood clot especially in the setting of negative contrasted imaging and active anticoagulation.  Patient was seen and evaluated by his primary  orthopedist yesterday and as  today's presentation is not different than yesterday did not reconsult orthopedics.  Pain controlled in the emergency room and and at this time he does not meet inpatient criteria for admission.  Patient discharged with outpatient follow-up   Additional history obtained: -Additional history obtained from wife -External records from outside source obtained and reviewed including: Chart review including previous notes, labs, imaging, consultation notes   Lab Tests: -I ordered, reviewed, and interpreted labs.   The pertinent results include:   Labs Reviewed  CBC WITH DIFFERENTIAL/PLATELET - Abnormal; Notable for the following components:      Result Value   WBC 13.7 (*)    RBC 3.89 (*)    Hemoglobin 10.4 (*)    HCT 31.8 (*)    RDW 17.9 (*)    Neutro Abs 9.6 (*)    Monocytes Absolute 1.6 (*)    All other components within normal limits  COMPREHENSIVE METABOLIC PANEL - Abnormal; Notable for the following components:   Sodium 132 (*)    Chloride 95 (*)    Glucose, Bld 120 (*)    All other components within normal limits  CULTURE, BLOOD (ROUTINE X 2)  CULTURE, BLOOD (ROUTINE X 2)  LACTIC ACID, PLASMA  LACTIC ACID, PLASMA      EKG   EKG Interpretation Date/Time:  Saturday February 01 2024 15:58:57 EDT Ventricular Rate:  67 PR Interval:  128 QRS Duration:  87 QT Interval:  362 QTC Calculation: 383 R Axis:   46  Text Interpretation: Sinus rhythm Borderline T abnormalities, inferior leads Confirmed by Milcah Dulany ([id number]) on 02/01/2024 9:51:30 PM         Imaging Studies ordered: I ordered imaging studies including CT femur with contrast I independently visualized and interpreted imaging. I agree with the radiologist interpretation   Medicines ordered and prescription drug management: Meds ordered this encounter  Medications   morphine (PF) 4 MG/ML injection 4 mg    Refill:  0   ondansetron (ZOFRAN) injection 4 mg   iohexol (OMNIPAQUE) 300 MG/ML solution 75 mL    lidocaine (LIDODERM) 5 % 1 patch   HYDROcodone-acetaminophen (NORCO/VICODIN) 5-325 MG per tablet 1 tablet    Refill:  0    -I have reviewed the patients home medicines and have made adjustments as needed  Critical interventions none   Cardiac Monitoring: The patient was maintained on a cardiac monitor.  I personally viewed and interpreted the cardiac monitored which showed an underlying rhythm of: NSR  Social Determinants of Health:  Factors impacting patients care include: none   Reevaluation: After the interventions noted above, I reevaluated the patient and found that they have :improved  Co morbidities that complicate the patient evaluation  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   Constipation    Resolved   COPD (chronic obstructive pulmonary disease) (HCC)    DDD (degenerative disc disease), cervical    Diverticulosis    ED (erectile dysfunction)    Esophageal varices (HCC)    GERD (gastroesophageal reflux disease)    H/O: upper GI bleed    Hepatitis    Hepatitis C antibody positive in blood    resolved   High blood pressure    History of anemia    Internal hemorrhoids    Left inguinal hernia 2020  Lung nodule    Small   Portal hypertensive gastropathy (HCC)    Sleep apnea    trying to get used to his CPAP, not wearing CPAP per SO [name] McAdoo   Umbilical hernia 2020      Dispostion: I considered admission for this patient, but at this time he does not meet inpatient criteria for admission and will be discharged with outpatient follow-up     Final Clinical Impression(s) / ED Diagnoses Final diagnoses:  None     @PCDICTATION @    Glendora Score, MD 02/01/24 2152

## 2024-02-01 NOTE — Discharge Instructions (Signed)
 For pain:  - Acetaminophen 1000 mg three times daily (every 8 hours) - lidoderm patches twice daily -Voltaren gel as needed -Oxycodone for breakthrough pain only

## 2024-02-01 NOTE — ED Notes (Signed)
 PT's family not comfortable with pt being discharged home when needing ultrasound. EDP made aware.

## 2024-02-01 NOTE — ED Provider Notes (Addendum)
 Patient given in sign out by Rosette Reveal, PA-C.  Please review their note for patient HPI, physical exam, workup.  At this time the plan is to speak with orthopedics about the D-dimer.  Consults: Lajoyce Corners, MD Orthopedics  I spoke to orthopedics and they state that patient will need to come back in the morning to have an ultrasound done.  I encouraged patient continue using his Eliquis and to return to the ER in the morning to have the ultrasound done.  At this time patient is able to ambulate without difficulty and is safe to be discharged.  I also recommend that he follows up with his orthopedist to be reevaluated for his knee.  Patient stable to be discharged.  Patient given return precautions.  Patient verbalized understanding and acceptance of this plan.  Attempt discharge patient's wife stated that she wanted to talk with the provider and states that they do not feel comfortable being discharge and to come back to have an ultrasound done in the morning as they cannot wait 7 hours as they live in [place].  Wife continue to voice her displeasure with this plan as I explained to her that we do not currently have ultrasound tech capable of doing a DVT study at this time unfortunately and that patient left come back in the morning as we have already set this up however wife and patient states they would like to speak to the attending.  Attending made aware.  Attending was to go evaluate the patient patient began complaining of chest pain and shortness of breath we will get CTA along with troponin and continue to monitor.  EKG was nonconcerning.  The rest of patient's labs and imaging are reassuring.  Delta troponin was negative.  CTA was negative for any PE or acute pathology.  At this time patient stable to be discharged and follow-up in the morning or later today for an ultrasound to rule out a DVT.  I strongly encouraged that he still follows up with orthopedics.  Patient given return precautions.  Patient  stable to be discharged.  Patient verbalized understanding acceptance of this plan.  I encouraged patient continue his anticoagulation.  The attending and I also placed a follow-up DVT study to be done at [facility] patient states that they are closer to [facility] as they are from [place] for patient's convenience.    Netta Corrigan, PA-C 02/01/24 0504    Netta Corrigan, PA-C 02/01/24 0505    Zadie Rhine, MD 02/01/24 0530

## 2024-02-01 NOTE — ED Notes (Signed)
 Per ED tech Burna Mortimer pt able to bear weight & ambulate in treatment room.

## 2024-02-01 NOTE — ED Triage Notes (Signed)
 BIB spouse from home for L upper medial thigh pain s/p L knee surgery. Describes knee and surgical site as unremarkable and fine. Pinpoints pain, bruising, swelling to L groin and medial thigh. Endorses chest hurts, cold and hot, light headed, dizzy, sweating. CMS intact, ROM limited, compartments soft. Pedal pulses palpable. Wearing compression hose. Alert, NAD, calm, interactive, resps e/u. Seen at [facility] ED last night. Pain, swelling and bruising is worse.

## 2024-02-01 NOTE — ED Notes (Signed)
 Pt became pale, diaphoretic & SOB while I was placing him back on the monitor for a BP. He said that is what he has been feeling today off & on. There was a visible change EDP aware & new orders placed.

## 2024-02-02 ENCOUNTER — Encounter: Payer: Self-pay | Admitting: Orthopaedic Surgery

## 2024-02-04 ENCOUNTER — Encounter: Payer: Medicare HMO | Admitting: Physician Assistant

## 2024-02-05 LAB — CULTURE, BLOOD (ROUTINE X 2)
Culture: NO GROWTH
Culture: NO GROWTH
Special Requests: ADEQUATE
Special Requests: ADEQUATE

## 2024-02-06 LAB — CULTURE, BLOOD (ROUTINE X 2)
Culture: NO GROWTH
Culture: NO GROWTH

## 2024-02-11 ENCOUNTER — Ambulatory Visit (INDEPENDENT_AMBULATORY_CARE_PROVIDER_SITE_OTHER): Admitting: Physician Assistant

## 2024-02-11 DIAGNOSIS — Z96652 Presence of left artificial knee joint: Secondary | ICD-10-CM

## 2024-02-11 MED ORDER — OXYCODONE HCL 5 MG PO TABS: 5.0000 mg | ORAL_TABLET | Freq: Two times a day (BID) | ORAL | 0 refills | Status: DC | PRN

## 2024-02-11 NOTE — Progress Notes (Signed)
 Post-Op Visit Note   Patient: Levi Alvarez           Date of Birth: [birth year]           MRN: [id number] Visit Date: 02/11/2024 PCP: Billie Lade, MD   Assessment & Plan:  Chief Complaint:  Chief Complaint  Patient presents with   Left Knee - Routine Post Op   Visit Diagnoses:  1. Status post total left knee replacement     Plan: Patient is a pleasant 53 year old gentleman who comes in today approximately 3 weeks status post left total knee replacement.  He had quite a bit of pain in the initial postoperative period was seen in the ED twice.  He has stopped taking narcotics and is only taking Tylenol.  Side effects have significantly improved but he still notes a fair amount of pain to the left knee.  He has been compliant taking Eliquis twice daily for DVT prophylaxis.  He has been getting home health physical therapy which is just ended.  Examination of the left knee reveals a well-healing surgical incision with nylon sutures in place.  He has a fair amount of swelling to the left knee.  Calves are soft and nontender.  He is neurovascularly intact distally.  Today, sutures were removed and Steri-Strips applied.  Put in referral for outpatient physical therapy.  I sent in plain oxycodone to take twice daily as needed for pain as I think you will need something stronger than Tylenol when he starts therapy.  I have also sent this in without Tylenol as he has a history of cirrhosis and hepatitis.  He will finish his Eliquis for which she is taking for DVT prophylaxis.  He will follow-up with [place] in 3 weeks for repeat evaluation and 2 view x-rays of the left knee.  Call with concerns or questions.  Follow-Up Instructions: Return in about 3 weeks (around 03/03/2024).   Orders:  Orders Placed This Encounter  Procedures   Ambulatory referral to Physical Therapy   Meds ordered this encounter  Medications   oxyCODONE (ROXICODONE) 5 MG immediate release tablet    Sig: Take 1 tablet (5 mg  total) by mouth 2 (two) times daily as needed for severe pain (pain score 7-10).    Dispense:  20 tablet    Refill:  0    Imaging: No new imaging  PMFS History: Patient Active Problem List   Diagnosis Date Noted   Status post total left knee replacement 01/27/2024   GERD (gastroesophageal reflux disease) 12/04/2023   History of hepatitis C virus infection 12/04/2023   Erectile dysfunction 12/04/2023   Hypogonadism in male 12/04/2023   MDD (major depressive disorder) 12/04/2023   Insomnia 12/04/2023   Slow urinary stream 12/04/2023   Hyponatremia 01/23/2022   Alcohol withdrawal (HCC) 01/22/2022   Class 1 obesity 01/22/2022   Alcohol withdrawal syndrome without complication (HCC)    Alcohol dependence with withdrawal with complication (HCC) 04/20/2021   Marijuana abuse, continuous 04/20/2021   Pancreatitis, acute 04/10/2021   Hypotension 04/10/2021   Chest pain 04/10/2021   Esophageal varices without bleeding (HCC)    Abnormal CT scan, colon    Benign neoplasm of colon    Bleeding esophageal varices (HCC)    Spondylosis without myelopathy or radiculopathy, cervical region 12/03/2018   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   Post-traumatic osteoarthritis of left knee 12/03/2018   Chronic obstructive pulmonary disease (HCC) 12/02/2018   Upper GI bleed 11/24/2018   Acute blood loss anemia  11/24/2018   Alcohol abuse 11/24/2018   Chronic back pain 11/24/2018   Chronic hepatitis C without hepatic coma (HCC) 10/08/2017   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   Chronic pain of left knee 07/23/2017   Essential hypertension 07/12/2017   OSA (obstructive sleep apnea) 07/12/2017   Class 2 obesity due to excess calories with body mass index (BMI) of 35.0 to 35.9 in adult 07/12/2017   Elevated liver enzymes 07/12/2017   Other chronic pain 07/12/2017   Smoker 07/12/2017   Hyperlipidemia 07/12/2017   Polyarthralgia 07/12/2017   Snoring 06/05/2017   Past Medical History:  Diagnosis  Date   Alcoholism (HCC)    Blood transfusion without reported diagnosis    jan, 2020 3 units PRBC   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   Constipation    Resolved   COPD (chronic obstructive pulmonary disease) (HCC)    DDD (degenerative disc disease), cervical    Diverticulosis    ED (erectile dysfunction)    Esophageal varices (HCC)    GERD (gastroesophageal reflux disease)    H/O: upper GI bleed    Hepatitis    Hepatitis C antibody positive in blood    resolved   High blood pressure    History of anemia    Internal hemorrhoids    Left inguinal hernia 2020   Lung nodule    Small   Portal hypertensive gastropathy (HCC)    Sleep apnea    trying to get used to his CPAP, not wearing CPAP per SO [name] McAdoo   Umbilical hernia 2020    Family History  Problem Relation Age of Onset   Colitis Mother    Arthritis Mother    Dementia Mother    Cancer Father        lymphoma?   Cancer Paternal Grandfather        bone   Heart disease Neg Hx    Stroke Neg Hx    Diabetes Neg Hx    Colon cancer Neg Hx    Esophageal cancer Neg Hx    Stomach cancer Neg Hx    Rectal cancer Neg Hx     Past Surgical History:  Procedure Laterality Date   arm surgery     left arm,   COLONOSCOPY WITH PROPOFOL N/A 01/19/2019   Procedure: COLONOSCOPY WITH PROPOFOL;  Surgeon: Benancio Deeds, MD;  Location: [facility];  Service: Gastroenterology;  Laterality: N/A;   ESOPHAGEAL BANDING  11/25/2018   Procedure: ESOPHAGEAL BANDING;  Surgeon: Rachael Fee, MD;  Location: [facility];  Service: Endoscopy;;   ESOPHAGEAL BANDING  01/19/2019   Procedure: ESOPHAGEAL BANDING;  Surgeon: Benancio Deeds, MD;  Location: [facility];  Service: Gastroenterology;;   ESOPHAGEAL BANDING N/A 01/24/2021   Procedure: ESOPHAGEAL BANDING;  Surgeon: Benancio Deeds, MD;  [facility]: [facility];  Service: Gastroenterology;  Laterality: N/A;   ESOPHAGOGASTRODUODENOSCOPY (EGD) WITH PROPOFOL N/A  11/25/2018   Procedure: ESOPHAGOGASTRODUODENOSCOPY (EGD) WITH PROPOFOL;  Surgeon: Rachael Fee, MD;  [facility];  Service: Endoscopy;  Laterality: N/A;   ESOPHAGOGASTRODUODENOSCOPY (EGD) WITH PROPOFOL N/A 01/19/2019   Procedure: ESOPHAGOGASTRODUODENOSCOPY (EGD) WITH PROPOFOL;  Surgeon: Benancio Deeds, MD;  [facility]: [facility];  Service: Gastroenterology;  Laterality: N/A;   ESOPHAGOGASTRODUODENOSCOPY (EGD) WITH PROPOFOL N/A 04/28/2019   Procedure: ESOPHAGOGASTRODUODENOSCOPY (EGD) WITH PROPOFOL;  Surgeon: Benancio Deeds, MD;  [facility]: [facility];  Service: Gastroenterology;  Laterality: N/A;   ESOPHAGOGASTRODUODENOSCOPY (EGD) WITH PROPOFOL N/A 11/03/2019  Procedure: ESOPHAGOGASTRODUODENOSCOPY (EGD) WITH PROPOFOL;  Surgeon: Sherrilyn Rist, MD;  [facility];  Service: Gastroenterology;  Laterality: N/A;   ESOPHAGOGASTRODUODENOSCOPY (EGD) WITH PROPOFOL N/A 01/24/2021   Procedure: ESOPHAGOGASTRODUODENOSCOPY (EGD) WITH PROPOFOL;  Surgeon: Benancio Deeds, MD;  [facility];  Service: Gastroenterology;  Laterality: N/A;   KNEE SURGERY Left    POLYPECTOMY  01/19/2019   Procedure: POLYPECTOMY;  Surgeon: Benancio Deeds, MD;  [facility]: [facility];  Service: Gastroenterology;;   TOTAL KNEE ARTHROPLASTY Left 01/27/2024   Procedure: LEFT TOTAL KNEE ARTHROPLASTY;  Surgeon: Tarry Kos, MD;  Location: [facility];  Service: Orthopedics;  Laterality: Left;   Social History   Occupational History   Occupation: disabled  Tobacco Use   Smoking status: Every Day    Current packs/day: 0.50    Average packs/day: 0.5 packs/day for 30.0 years (15.0 ttl pk-yrs)    Types: Cigarettes    Passive exposure: Current   Smokeless tobacco: Former    Types: Engineer, drilling   Vaping status: Never Used  Substance and Sexual Activity   Alcohol use: Not Currently    Alcohol/week: 36.0 standard drinks of alcohol    Types: 36 Cans of beer per week    Comment:  quit March 2023   Drug use: Yes    Types: Marijuana    Comment: daily use - informed to withhold 24-48 hrs prior to procedure   Sexual activity: Yes    Partners: Female

## 2024-02-12 NOTE — Therapy (Signed)
 OUTPATIENT PHYSICAL THERAPY LOWER EXTREMITY EVALUATION   Patient Name: Levi Alvarez MRN: [id number] DOB:12/22/1970, 53 y.o., male Today's Date: 02/13/2024  END OF SESSION:  PT End of Session - 02/13/24 0945     Visit Number 1    Number of Visits 8    Date for PT Re-Evaluation 03/13/24    Authorization Type [employer]    Authorization Time Period no auth needed    PT Start Time 0940   late arrival   PT Stop Time 1015    PT Time Calculation (min) 35 min    Activity Tolerance Patient tolerated treatment well    Behavior During Therapy WFL for tasks assessed/performed             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   Constipation    Resolved   COPD (chronic obstructive pulmonary disease) (HCC)    DDD (degenerative disc disease), cervical    Diverticulosis    ED (erectile dysfunction)    Esophageal varices (HCC)    GERD (gastroesophageal reflux disease)    H/O: upper GI bleed    Hepatitis    Hepatitis C antibody positive in blood    resolved   High blood pressure    History of anemia    Internal hemorrhoids    Left inguinal hernia 2020   Lung nodule    Small   Portal hypertensive gastropathy (HCC)    Sleep apnea    trying to get used to his CPAP, not wearing CPAP per SO [name] McAdoo   Umbilical hernia 2020   Past Surgical History:  Procedure Laterality Date   arm surgery     left arm,   COLONOSCOPY WITH PROPOFOL N/A 01/19/2019   Procedure: COLONOSCOPY WITH PROPOFOL;  Surgeon: Benancio Deeds, MD;  [facility]: [facility];  Service: Gastroenterology;  Laterality: N/A;   ESOPHAGEAL BANDING  11/25/2018   Procedure: ESOPHAGEAL BANDING;  Surgeon: Rachael Fee, MD;  Location: [facility];  Service: Endoscopy;;   ESOPHAGEAL BANDING  01/19/2019   Procedure: ESOPHAGEAL BANDING;  Surgeon: Benancio Deeds,  MD;  Location: [facility];  Service: Gastroenterology;;   ESOPHAGEAL BANDING N/A 01/24/2021   Procedure: ESOPHAGEAL BANDING;  Surgeon: Benancio Deeds, MD;  [facility];  Service: Gastroenterology;  Laterality: N/A;   ESOPHAGOGASTRODUODENOSCOPY (EGD) WITH PROPOFOL N/A 11/25/2018   Procedure: ESOPHAGOGASTRODUODENOSCOPY (EGD) WITH PROPOFOL;  Surgeon: Rachael Fee, MD;  [facility];  Service: Endoscopy;  Laterality: N/A;   ESOPHAGOGASTRODUODENOSCOPY (EGD) WITH PROPOFOL N/A 01/19/2019   Procedure: ESOPHAGOGASTRODUODENOSCOPY (EGD) WITH PROPOFOL;  Surgeon: Benancio Deeds, MD;  [facility];  Service: Gastroenterology;  Laterality: N/A;   ESOPHAGOGASTRODUODENOSCOPY (EGD) WITH PROPOFOL N/A 04/28/2019   Procedure: ESOPHAGOGASTRODUODENOSCOPY (EGD) WITH PROPOFOL;  Surgeon: Benancio Deeds, MD;  [facility]: [facility];  Service: Gastroenterology;  Laterality: N/A;   ESOPHAGOGASTRODUODENOSCOPY (EGD) WITH PROPOFOL N/A 11/03/2019   Procedure: ESOPHAGOGASTRODUODENOSCOPY (EGD) WITH PROPOFOL;  Surgeon: Sherrilyn Rist, MD;  [facility]: [facility];  Service: Gastroenterology;  Laterality: N/A;   ESOPHAGOGASTRODUODENOSCOPY (EGD) WITH PROPOFOL N/A 01/24/2021   Procedure: ESOPHAGOGASTRODUODENOSCOPY (EGD) WITH PROPOFOL;  Surgeon: Benancio Deeds, MD;  Location: [facility];  Service: Gastroenterology;  Laterality: N/A;   KNEE SURGERY Left    POLYPECTOMY  01/19/2019   Procedure: POLYPECTOMY;  Surgeon: Adela Lank,  Willaim Rayas, MD;  Location: [facility];  Service: Gastroenterology;;   TOTAL KNEE ARTHROPLASTY Left 01/27/2024   Procedure: LEFT TOTAL KNEE ARTHROPLASTY;  Surgeon: Tarry Kos, MD;  Location: [facility];  Service: Orthopedics;  Laterality: Left;   Patient Active Problem List   Diagnosis Date Noted   Status post total left knee replacement 01/27/2024   GERD (gastroesophageal reflux disease) 12/04/2023   History of hepatitis C virus infection 12/04/2023    Erectile dysfunction 12/04/2023   Hypogonadism in male 12/04/2023   MDD (major depressive disorder) 12/04/2023   Insomnia 12/04/2023   Slow urinary stream 12/04/2023   Hyponatremia 01/23/2022   Alcohol withdrawal (HCC) 01/22/2022   Class 1 obesity 01/22/2022   Alcohol withdrawal syndrome without complication (HCC)    Alcohol dependence with withdrawal with complication (HCC) 04/20/2021   Marijuana abuse, continuous 04/20/2021   Pancreatitis, acute 04/10/2021   Hypotension 04/10/2021   Chest pain 04/10/2021   Esophageal varices without bleeding (HCC)    Abnormal CT scan, colon    Benign neoplasm of colon    Bleeding esophageal varices (HCC)    Spondylosis without myelopathy or radiculopathy, cervical region 12/03/2018   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   Post-traumatic osteoarthritis of left knee 12/03/2018   Chronic obstructive pulmonary disease (HCC) 12/02/2018   Upper GI bleed 11/24/2018   Acute blood loss anemia 11/24/2018   Alcohol abuse 11/24/2018   Chronic back pain 11/24/2018   Chronic hepatitis C without hepatic coma (HCC) 10/08/2017   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   Chronic pain of left knee 07/23/2017   Essential hypertension 07/12/2017   OSA (obstructive sleep apnea) 07/12/2017   Class 2 obesity due to excess calories with body mass index (BMI) of 35.0 to 35.9 in adult 07/12/2017   Elevated liver enzymes 07/12/2017   Other chronic pain 07/12/2017   Smoker 07/12/2017   Hyperlipidemia 07/12/2017   Polyarthralgia 07/12/2017   Snoring 06/05/2017    PCP: Delmar Landau, MD  REFERRING PROVIDER: Cristie Hem, PA-C; Dr. Roda Shutters  REFERRING DIAG:  Diagnosis  [id number] (ICD-10-CM) - Status post total left knee replacement    THERAPY DIAG:  Acute pain of left knee - Plan: PT plan of care cert/re-cert  Stiffness of left knee, not elsewhere classified - Plan: PT plan of care cert/re-cert  Difficulty in walking, not elsewhere classified - Plan: PT plan of care  cert/re-cert  Primary osteoarthritis of left knee - Plan: PT plan of care cert/re-cert  Rationale for Evaluation and Treatment: Rehabilitation  ONSET DATE: 01/27/24 s/p  SUBJECTIVE:   SUBJECTIVE STATEMENT: S/p left knee TKA 01/27/24 per Dr Roda Shutters.; had home health;  followed up with surgeon yesterday had staples removed and steristrips remain. Arrives with [facility].    PERTINENT HISTORY: Old injury years ago left side wrapped up in a machine; that what was caused initial injury to knee.   COPD PAIN:  Are you having pain? Yes: NPRS scale: 5/10 Pain location: left leg Pain description: sore, tight and aching Aggravating factors: stay still too long Relieving factors: moving  PRECAUTIONS: None  RED FLAGS: None   WEIGHT BEARING RESTRICTIONS: No  FALLS:  Has patient fallen in last 6 months? No   OCCUPATION: not working, disability  PLOF: Independent  PATIENT GOALS: get my pain better and get around better  NEXT MD VISIT: 4 weeks  OBJECTIVE:  Note: Objective measures were completed at Evaluation unless otherwise noted.  DIAGNOSTIC FINDINGS:   PATIENT SURVEYS:  LEFS 29/80 36.3%  COGNITION: Overall cognitive  status: Within functional limits for tasks assessed     SENSATION: Some numbness at and around incision  EDEMA:  Normal for this time s/p   POSTURE: rounded shoulders, forward head, decreased lumbar lordosis, and weight shift right  PALPATION: General soreness   LOWER EXTREMITY ROM:  Active ROM Right eval Left eval  Hip flexion    Hip extension    Hip abduction    Hip adduction    Hip internal rotation    Hip external rotation    Knee flexion 130 90  Knee extension 0 -14  Ankle dorsiflexion    Ankle plantarflexion    Ankle inversion    Ankle eversion     (Blank rows = not tested)  LOWER EXTREMITY MMT:  MMT Right eval Left eval  Hip flexion 5 3+  Hip extension    Hip abduction    Hip adduction    Hip internal rotation    Hip external  rotation    Knee flexion    Knee extension 5 3-  Ankle dorsiflexion 5 4-  Ankle plantarflexion    Ankle inversion    Ankle eversion     (Blank rows = not tested)   FUNCTIONAL TESTS:  5 times sit to stand: 27.39 sec using hands to assist up to standing 2 minute walk test: 216 ft with  SPC  GAIT: Distance walked: 216 ft Assistive device utilized: Single point cane Level of assistance: Modified independence Comments: antalgic gait; external rotation of left lower extremity                                                                                                                                TREATMENT DATE: 02/13/24  physical therapy evaluation and HEP instruction   PATIENT EDUCATION:  Education details: Patient educated on exam findings, POC, scope of PT, HEP, and what to expect next visit. Person educated: Patient Education method: Explanation, Demonstration, and Handouts Education comprehension: verbalized understanding, returned demonstration, verbal cues required, and tactile cues required    HOME EXERCISE PROGRAM: Access Code: [email or web address] URL: [email or web address] Date: 02/13/2024 Prepared by: [facility] - Rehab  Exercises - Seated Knee Extension Stretch with Chair  - 2 x daily - 7 x weekly - 1 sets - 1 reps - 5 minutes hold - Supine Knee Extension Stretch on Towel Roll  - 2 x daily - 7 x weekly - 1 sets - 1 reps - 5 min hold - Supine Heel Slide  - 1 x daily - 7 x weekly - 1 sets - 10 reps Continue with HHPT exercises and CPM  ASSESSMENT:  CLINICAL IMPRESSION: Patient is a 53 y.o. male who was seen today for physical therapy evaluation and treatment for  Status post total left knee replacement. Patient demonstrates muscle weakness, reduced ROM, and fascial restrictions which are likely contributing to symptoms of pain and are negatively impacting patient ability to perform ADLs and functional mobility tasks. Patient will  benefit from skilled physical therapy  services to address these deficits to reduce pain and improve level of function with ADLs and functional mobility tasks.     OBJECTIVE IMPAIRMENTS: Abnormal gait, decreased activity tolerance, decreased mobility, difficulty walking, decreased ROM, decreased strength, hypomobility, increased fascial restrictions, impaired perceived functional ability, and pain.   ACTIVITY LIMITATIONS: carrying, lifting, bending, sitting, standing, squatting, sleeping, stairs, transfers, bed mobility, and locomotion level  PARTICIPATION LIMITATIONS: meal prep, cleaning, driving, shopping, community activity, and yard work  PERSONAL FACTORS: Past/current experiences are also affecting patient's functional outcome.   REHAB POTENTIAL: Good  CLINICAL DECISION MAKING: Evolving/moderate complexity  EVALUATION COMPLEXITY: Moderate   GOALS: Goals reviewed with patient? No  SHORT TERM GOALS: Target date: 02/27/2024 patient will be independent with initial HEP  Baseline: Goal status: INITIAL  2.  Patient will report 50% improvement overall  Baseline:  Goal status: INITIAL  3.  Patient will increase left knee AROM to -8 to 100 to improve gait mechanics walking household distances Baseline: see above Goal status: INITIAL  LONG TERM GOALS: Target date: 03/13/2024  Patient will be independent in self management strategies to improve quality of life and functional outcomes.  Baseline:  Goal status: INITIAL  2.  Patient will report 75% improvement overall  Baseline:  Goal status: INITIAL  3.  Patient will increase left knee mobility to -2 to 120 to promote normal navigation of steps; step over step pattern  Baseline: see above Goal status: INITIAL  4.   Patient will increase left leg MMT's to 5/5 to allow navigation of steps without gait deviation or loss of balance  Baseline: see above Goal status: INITIAL  5.  Patient will improve LEFS score by 11 points to demonstrate improved perceived  function  Baseline: 29/80 Goal status: INITIAL  6.  Patient will increase distance on 2 MWT to 300 ft with LRAD to improve efficacy with ambulating community distances  Baseline: 216 ft with SPC Goal status: INITIAL   PLAN:  PT FREQUENCY: 2x/week  PT DURATION: 4 weeks  PLANNED INTERVENTIONS: 97164- PT Re-evaluation, 97110-Therapeutic exercises, 97530- Therapeutic activity, 97112- Neuromuscular re-education, 97535- Self Care, 16109- Manual therapy, [phone]- Gait training, [phone]- Orthotic Fit/training, [phone]- Canalith repositioning, [id number]- Aquatic Therapy, [phone]- Splinting, Patient/Family education, Balance training, Stair training, Taping, Dry Needling, Joint mobilization, Joint manipulation, Spinal manipulation, Spinal mobilization, Scar mobilization, and DME instructions.   PLAN FOR NEXT SESSION: Review HEP and goals; left knee mobility and strength; gait training; check balance  11:14 AM, 02/13/24 Alvar Malinoski Small Vesta [place] MPT [place] [facility] [place] [phone] Ph:[phone]

## 2024-02-13 ENCOUNTER — Ambulatory Visit (HOSPITAL_COMMUNITY): Attending: Physician Assistant

## 2024-02-13 ENCOUNTER — Other Ambulatory Visit: Payer: Self-pay

## 2024-02-13 DIAGNOSIS — M25562 Pain in left knee: Secondary | ICD-10-CM | POA: Insufficient documentation

## 2024-02-13 DIAGNOSIS — M25662 Stiffness of left knee, not elsewhere classified: Secondary | ICD-10-CM | POA: Insufficient documentation

## 2024-02-13 DIAGNOSIS — M1712 Unilateral primary osteoarthritis, left knee: Secondary | ICD-10-CM | POA: Insufficient documentation

## 2024-02-13 DIAGNOSIS — Z96652 Presence of left artificial knee joint: Secondary | ICD-10-CM | POA: Diagnosis not present

## 2024-02-13 DIAGNOSIS — R262 Difficulty in walking, not elsewhere classified: Secondary | ICD-10-CM | POA: Insufficient documentation

## 2024-02-18 ENCOUNTER — Encounter (HOSPITAL_COMMUNITY)

## 2024-02-19 ENCOUNTER — Other Ambulatory Visit: Payer: Self-pay | Admitting: Physician Assistant

## 2024-02-19 ENCOUNTER — Ambulatory Visit: Payer: Medicare HMO | Admitting: Urology

## 2024-02-20 ENCOUNTER — Ambulatory Visit (HOSPITAL_COMMUNITY)

## 2024-02-20 DIAGNOSIS — M25562 Pain in left knee: Secondary | ICD-10-CM | POA: Diagnosis not present

## 2024-02-20 DIAGNOSIS — R262 Difficulty in walking, not elsewhere classified: Secondary | ICD-10-CM

## 2024-02-20 DIAGNOSIS — M1712 Unilateral primary osteoarthritis, left knee: Secondary | ICD-10-CM

## 2024-02-20 DIAGNOSIS — M25662 Stiffness of left knee, not elsewhere classified: Secondary | ICD-10-CM

## 2024-02-20 NOTE — Therapy (Signed)
 OUTPATIENT PHYSICAL THERAPY LOWER EXTREMITY TREATMENT   Patient Name: Levi Alvarez MRN: [id number] DOB:01-11-1971, 53 y.o., male Today's Date: 02/20/2024  END OF SESSION:  PT End of Session - 02/20/24 1354     Visit Number 2    Number of Visits 8    Date for PT Re-Evaluation 03/13/24    Authorization Type [employer]    Authorization Time Period no auth needed    PT Start Time 1350    PT Stop Time 1430    PT Time Calculation (min) 40 min    Activity Tolerance Patient tolerated treatment well    Behavior During Therapy WFL for tasks assessed/performed             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   Constipation    Resolved   COPD (chronic obstructive pulmonary disease) (HCC)    DDD (degenerative disc disease), cervical    Diverticulosis    ED (erectile dysfunction)    Esophageal varices (HCC)    GERD (gastroesophageal reflux disease)    H/O: upper GI bleed    Hepatitis    Hepatitis C antibody positive in blood    resolved   High blood pressure    History of anemia    Internal hemorrhoids    Left inguinal hernia 2020   Lung nodule    Small   Portal hypertensive gastropathy (HCC)    Sleep apnea    trying to get used to his CPAP, not wearing CPAP per SO [name] McAdoo   Umbilical hernia 2020   Past Surgical History:  Procedure Laterality Date   arm surgery     left arm,   COLONOSCOPY WITH PROPOFOL N/A 01/19/2019   Procedure: COLONOSCOPY WITH PROPOFOL;  Surgeon: Benancio Deeds, MD;  Location: [facility];  Service: Gastroenterology;  Laterality: N/A;   ESOPHAGEAL BANDING  11/25/2018   Procedure: ESOPHAGEAL BANDING;  Surgeon: Rachael Fee, MD;  Location: [facility];  Service: Endoscopy;;   ESOPHAGEAL BANDING  01/19/2019   Procedure: ESOPHAGEAL BANDING;  Surgeon: Benancio Deeds, MD;  Location:  [facility];  Service: Gastroenterology;;   ESOPHAGEAL BANDING N/A 01/24/2021   Procedure: ESOPHAGEAL BANDING;  Surgeon: Benancio Deeds, MD;  Location: [facility];  Service: Gastroenterology;  Laterality: N/A;   ESOPHAGOGASTRODUODENOSCOPY (EGD) WITH PROPOFOL N/A 11/25/2018   Procedure: ESOPHAGOGASTRODUODENOSCOPY (EGD) WITH PROPOFOL;  Surgeon: Rachael Fee, MD;  Location: [facility];  Service: Endoscopy;  Laterality: N/A;   ESOPHAGOGASTRODUODENOSCOPY (EGD) WITH PROPOFOL N/A 01/19/2019   Procedure: ESOPHAGOGASTRODUODENOSCOPY (EGD) WITH PROPOFOL;  Surgeon: Benancio Deeds, MD;  Location: [facility];  Service: Gastroenterology;  Laterality: N/A;   ESOPHAGOGASTRODUODENOSCOPY (EGD) WITH PROPOFOL N/A 04/28/2019   Procedure: ESOPHAGOGASTRODUODENOSCOPY (EGD) WITH PROPOFOL;  Surgeon: Benancio Deeds, MD;  Location: [facility];  Service: Gastroenterology;  Laterality: N/A;   ESOPHAGOGASTRODUODENOSCOPY (EGD) WITH PROPOFOL N/A 11/03/2019   Procedure: ESOPHAGOGASTRODUODENOSCOPY (EGD) WITH PROPOFOL;  Surgeon: Sherrilyn Rist, MD;  Location: [facility];  Service: Gastroenterology;  Laterality: N/A;   ESOPHAGOGASTRODUODENOSCOPY (EGD) WITH PROPOFOL N/A 01/24/2021   Procedure: ESOPHAGOGASTRODUODENOSCOPY (EGD) WITH PROPOFOL;  Surgeon: Benancio Deeds, MD;  Location: [facility];  Service: Gastroenterology;  Laterality: N/A;   KNEE SURGERY Left    POLYPECTOMY  01/19/2019   Procedure: POLYPECTOMY;  Surgeon: Benancio Deeds, MD;  Location: [facility];  Service: Gastroenterology;;   TOTAL KNEE ARTHROPLASTY Left 01/27/2024   Procedure: LEFT TOTAL KNEE ARTHROPLASTY;  Surgeon: Tarry Kos, MD;  Location: [facility];  Service: Orthopedics;  Laterality: Left;   Patient Active Problem List   Diagnosis Date Noted   Status post total left knee replacement 01/27/2024   GERD (gastroesophageal reflux disease) 12/04/2023   History of hepatitis C virus infection 12/04/2023   Erectile  dysfunction 12/04/2023   Hypogonadism in male 12/04/2023   MDD (major depressive disorder) 12/04/2023   Insomnia 12/04/2023   Slow urinary stream 12/04/2023   Hyponatremia 01/23/2022   Alcohol withdrawal (HCC) 01/22/2022   Class 1 obesity 01/22/2022   Alcohol withdrawal syndrome without complication (HCC)    Alcohol dependence with withdrawal with complication (HCC) 04/20/2021   Marijuana abuse, continuous 04/20/2021   Pancreatitis, acute 04/10/2021   Hypotension 04/10/2021   Chest pain 04/10/2021   Esophageal varices without bleeding (HCC)    Abnormal CT scan, colon    Benign neoplasm of colon    Bleeding esophageal varices (HCC)    Spondylosis without myelopathy or radiculopathy, cervical region 12/03/2018   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   Post-traumatic osteoarthritis of left knee 12/03/2018   Chronic obstructive pulmonary disease (HCC) 12/02/2018   Upper GI bleed 11/24/2018   Acute blood loss anemia 11/24/2018   Alcohol abuse 11/24/2018   Chronic back pain 11/24/2018   Chronic hepatitis C without hepatic coma (HCC) 10/08/2017   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   Chronic pain of left knee 07/23/2017   Essential hypertension 07/12/2017   OSA (obstructive sleep apnea) 07/12/2017   Class 2 obesity due to excess calories with body mass index (BMI) of 35.0 to 35.9 in adult 07/12/2017   Elevated liver enzymes 07/12/2017   Other chronic pain 07/12/2017   Smoker 07/12/2017   Hyperlipidemia 07/12/2017   Polyarthralgia 07/12/2017   Snoring 06/05/2017    PCP: Delmar Landau, MD  REFERRING PROVIDER: Cristie Hem, PA-C; Dr. Roda Shutters  REFERRING DIAG:  Diagnosis  [phone] (ICD-10-CM) - Status post total left knee replacement    THERAPY DIAG:  Acute pain of left knee  Stiffness of left knee, not elsewhere classified  Difficulty in walking, not elsewhere classified  Primary osteoarthritis of left knee  Rationale for Evaluation and Treatment: Rehabilitation  ONSET  DATE: 01/27/24 s/p  SUBJECTIVE:   SUBJECTIVE STATEMENT: 3/10 pain today on arrival today; using SPC; walking more and doing his exercises  EVAL:S/p left knee TKA 01/27/24 per Dr Roda Shutters.; had home health;  followed up with surgeon yesterday had staples removed and steristrips remain. Arrives with [facility]    PERTINENT HISTORY: Old injury years ago left side wrapped up in a machine; that what was caused initial injury to knee.   COPD PAIN:  Are you having pain? Yes: NPRS scale: 5/10 Pain location: left leg Pain description: sore, tight and aching Aggravating factors: stay still too long Relieving factors: moving  PRECAUTIONS: None  RED FLAGS: None   WEIGHT BEARING RESTRICTIONS: No  FALLS:  Has patient fallen in last 6 months? No   OCCUPATION: not working, disability  PLOF: Independent  PATIENT GOALS: get my pain better and get around better  NEXT MD VISIT: 4 weeks  OBJECTIVE:  Note: Objective measures were completed at Evaluation unless otherwise noted.  DIAGNOSTIC FINDINGS:   PATIENT SURVEYS:  LEFS 29/80 36.3%  COGNITION: Overall cognitive status: Within functional limits for tasks assessed     SENSATION: Some numbness at and around  incision  EDEMA:  Normal for this time s/p   POSTURE: rounded shoulders, forward head, decreased lumbar lordosis, and weight shift right  PALPATION: General soreness   LOWER EXTREMITY ROM:  Active ROM Right eval Left eval Left 02/20/24  Hip flexion     Hip extension     Hip abduction     Hip adduction     Hip internal rotation     Hip external rotation     Knee flexion 130 90 102  Knee extension 0 -14 -8  Ankle dorsiflexion     Ankle plantarflexion     Ankle inversion     Ankle eversion      (Blank rows = not tested)  LOWER EXTREMITY MMT:  MMT Right eval Left eval  Hip flexion 5 3+  Hip extension    Hip abduction    Hip adduction    Hip internal rotation    Hip external rotation    Knee flexion    Knee  extension 5 3-  Ankle dorsiflexion 5 4-  Ankle plantarflexion    Ankle inversion    Ankle eversion     (Blank rows = not tested)   FUNCTIONAL TESTS:  5 times sit to stand: 27.39 sec using hands to assist up to standing 2 minute walk test: 216 ft with  SPC  GAIT: Distance walked: 216 ft Assistive device utilized: Single point cane Level of assistance: Modified independence Comments: antalgic gait; external rotation of left lower extremity                                                                                                                                TREATMENT DATE: 02/20/24 Review of HEP and goals Supine: STM and manual ROM to left knee x 10' to decrease pain and improve soft tissue extensibility and joint mobility -8 to 102 AROM Left knee today Quad set 5" x 10 SAQ's 2 x 10 Recumbent Bike seat 12 x 5' rocking mobility      02/13/24  physical therapy evaluation and HEP instruction   PATIENT EDUCATION:  Education details: Patient educated on exam findings, POC, scope of PT, HEP, and what to expect next visit. Person educated: Patient Education method: Explanation, Demonstration, and Handouts Education comprehension: verbalized understanding, returned demonstration, verbal cues required, and tactile cues required    HOME EXERCISE PROGRAM: Access Code: [email or web address] URL: [email or web address] Date: 02/13/2024 Prepared by: [facility] - Rehab  Exercises - Seated Knee Extension Stretch with Chair  - 2 x daily - 7 x weekly - 1 sets - 1 reps - 5 minutes hold - Supine Knee Extension Stretch on Towel Roll  - 2 x daily - 7 x weekly - 1 sets - 1 reps - 5 min hold - Supine Heel Slide  - 1 x daily - 7 x weekly - 1 sets - 10 reps Continue with HHPT exercises and CPM  ASSESSMENT:  CLINICAL IMPRESSION: Today's session started  with a review of HEP and goals.  Patient verbalizes agreement with set rehab goals. STM and manual ROM to improve joint mobility and soft  tissue extensibility with good improvement with mobility but unable to make full revolution on bike today. Some steristrips have come off; no oozing or draining noted.  Ambulates in PT gym with SPC.   Patient will benefit from continued skilled therapy services to address deficits and promote return to optimal function.       Eval:Patient is a 53 y.o. male who was seen today for physical therapy evaluation and treatment for  Status post total left knee replacement. Patient demonstrates muscle weakness, reduced ROM, and fascial restrictions which are likely contributing to symptoms of pain and are negatively impacting patient ability to perform ADLs and functional mobility tasks. Patient will benefit from skilled physical therapy services to address these deficits to reduce pain and improve level of function with ADLs and functional mobility tasks.     OBJECTIVE IMPAIRMENTS: Abnormal gait, decreased activity tolerance, decreased mobility, difficulty walking, decreased ROM, decreased strength, hypomobility, increased fascial restrictions, impaired perceived functional ability, and pain.   ACTIVITY LIMITATIONS: carrying, lifting, bending, sitting, standing, squatting, sleeping, stairs, transfers, bed mobility, and locomotion level  PARTICIPATION LIMITATIONS: meal prep, cleaning, driving, shopping, community activity, and yard work  PERSONAL FACTORS: Past/current experiences are also affecting patient's functional outcome.   REHAB POTENTIAL: Good  CLINICAL DECISION MAKING: Evolving/moderate complexity  EVALUATION COMPLEXITY: Moderate   GOALS: Goals reviewed with patient? No  SHORT TERM GOALS: Target date: 02/27/2024 patient will be independent with initial HEP  Baseline: Goal status: In progress  2.  Patient will report 50% improvement overall  Baseline:  Goal status: IN progress   3.  Patient will increase left knee AROM to -8 to 100 to improve gait mechanics walking household  distances Baseline: see above Goal status: met  LONG TERM GOALS: Target date: 03/13/2024  Patient will be independent in self management strategies to improve quality of life and functional outcomes.  Baseline:  Goal status: in progress  2.  Patient will report 75% improvement overall  Baseline:  Goal status: IN progress  3.  Patient will increase left knee mobility to -2 to 120 to promote normal navigation of steps; step over step pattern  Baseline: see above Goal status: IN progress  4.   Patient will increase left leg MMT's to 5/5 to allow navigation of steps without gait deviation or loss of balance  Baseline: see above Goal status: IN progress  5.  Patient will improve LEFS score by 11 points to demonstrate improved perceived function  Baseline: 29/80 Goal status: IN progress  6.  Patient will increase distance on 2 MWT to 300 ft with LRAD to improve efficacy with ambulating community distances  Baseline: 216 ft with SPC Goal status: IN progress   PLAN:  PT FREQUENCY: 2x/week  PT DURATION: 4 weeks  PLANNED INTERVENTIONS: 97164- PT Re-evaluation, 97110-Therapeutic exercises, 97530- Therapeutic activity, 97112- Neuromuscular re-education, 97535- Self Care, [id number]- Manual therapy, [phone]- Gait training, [phone]- Orthotic Fit/training, [phone]- Canalith repositioning, [id number]- Aquatic Therapy, [phone]- Splinting, Patient/Family education, Balance training, Stair training, Taping, Dry Needling, Joint mobilization, Joint manipulation, Spinal manipulation, Spinal mobilization, Scar mobilization, and DME instructions.   PLAN FOR NEXT SESSION: left knee mobility and strength; gait training; check balance  2:30 PM, 02/20/24 Yasheka Fossett Small Nel Stoneking MPT [facility] [place] [phone] Ph:[phone]

## 2024-02-21 ENCOUNTER — Encounter: Payer: Self-pay | Admitting: Orthopaedic Surgery

## 2024-02-24 MED ORDER — OXYCODONE HCL 5 MG PO TABS: 5.0000 mg | ORAL_TABLET | Freq: Four times a day (QID) | ORAL | 0 refills | Status: AC | PRN

## 2024-02-27 ENCOUNTER — Encounter (HOSPITAL_COMMUNITY): Payer: Self-pay

## 2024-02-27 ENCOUNTER — Ambulatory Visit (HOSPITAL_COMMUNITY)

## 2024-02-27 DIAGNOSIS — M1712 Unilateral primary osteoarthritis, left knee: Secondary | ICD-10-CM

## 2024-02-27 DIAGNOSIS — M25562 Pain in left knee: Secondary | ICD-10-CM

## 2024-02-27 DIAGNOSIS — R262 Difficulty in walking, not elsewhere classified: Secondary | ICD-10-CM

## 2024-02-27 DIAGNOSIS — M25662 Stiffness of left knee, not elsewhere classified: Secondary | ICD-10-CM

## 2024-02-27 NOTE — Therapy (Signed)
 OUTPATIENT PHYSICAL THERAPY LOWER EXTREMITY TREATMENT   Patient Name: Levi Alvarez MRN: [id number] DOB:24-Mar-1971, 53 y.o., male Today's Date: 02/27/2024  END OF SESSION:  PT End of Session - 02/27/24 1711     Visit Number 3    Number of Visits 8    Date for PT Re-Evaluation 03/13/24    Authorization Type [employer]    Authorization Time Period no auth needed    PT Start Time 1645    PT Stop Time 1723    PT Time Calculation (min) 38 min    Activity Tolerance Patient tolerated treatment well;Patient limited by pain    Behavior During Therapy WFL for tasks assessed/performed              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   Constipation    Resolved   COPD (chronic obstructive pulmonary disease) (HCC)    DDD (degenerative disc disease), cervical    Diverticulosis    ED (erectile dysfunction)    Esophageal varices (HCC)    GERD (gastroesophageal reflux disease)    H/O: upper GI bleed    Hepatitis    Hepatitis C antibody positive in blood    resolved   High blood pressure    History of anemia    Internal hemorrhoids    Left inguinal hernia 2020   Lung nodule    Small   Portal hypertensive gastropathy (HCC)    Sleep apnea    trying to get used to his CPAP, not wearing CPAP per SO [name] McAdoo   Umbilical hernia 2020   Past Surgical History:  Procedure Laterality Date   arm surgery     left arm,   COLONOSCOPY WITH PROPOFOL N/A 01/19/2019   Procedure: COLONOSCOPY WITH PROPOFOL;  Surgeon: Ace Holder, MD;  Location: [facility];  Service: Gastroenterology;  Laterality: N/A;   ESOPHAGEAL BANDING  11/25/2018   Procedure: ESOPHAGEAL BANDING;  Surgeon: Janel Medford, MD;  Location: [facility];  Service: Endoscopy;;   ESOPHAGEAL BANDING  01/19/2019   Procedure: ESOPHAGEAL BANDING;  Surgeon: Ace Holder, MD;  Location: [facility];  Service: Gastroenterology;;   ESOPHAGEAL BANDING N/A 01/24/2021   Procedure: ESOPHAGEAL BANDING;  Surgeon: Ace Holder, MD;  Location: [facility];  Service: Gastroenterology;  Laterality: N/A;   ESOPHAGOGASTRODUODENOSCOPY (EGD) WITH PROPOFOL N/A 11/25/2018   Procedure: ESOPHAGOGASTRODUODENOSCOPY (EGD) WITH PROPOFOL;  Surgeon: Janel Medford, MD;  Location: [facility];  Service: Endoscopy;  Laterality: N/A;   ESOPHAGOGASTRODUODENOSCOPY (EGD) WITH PROPOFOL N/A 01/19/2019   Procedure: ESOPHAGOGASTRODUODENOSCOPY (EGD) WITH PROPOFOL;  Surgeon: Ace Holder, MD;  Location: [facility];  Service: Gastroenterology;  Laterality: N/A;   ESOPHAGOGASTRODUODENOSCOPY (EGD) WITH PROPOFOL N/A 04/28/2019   Procedure: ESOPHAGOGASTRODUODENOSCOPY (EGD) WITH PROPOFOL;  Surgeon: Ace Holder, MD;  Location: [facility];  Service: Gastroenterology;  Laterality: N/A;   ESOPHAGOGASTRODUODENOSCOPY (EGD) WITH PROPOFOL N/A 11/03/2019   Procedure: ESOPHAGOGASTRODUODENOSCOPY (EGD) WITH PROPOFOL;  Surgeon: Albertina Hugger, MD;  Location: [facility];  Service: Gastroenterology;  Laterality: N/A;   ESOPHAGOGASTRODUODENOSCOPY (EGD) WITH PROPOFOL N/A 01/24/2021   Procedure: ESOPHAGOGASTRODUODENOSCOPY (EGD) WITH PROPOFOL;  Surgeon: Ace Holder, MD;  Location: [facility];  Service: Gastroenterology;  Laterality: N/A;   KNEE SURGERY Left    POLYPECTOMY  01/19/2019   Procedure: POLYPECTOMY;  Surgeon:  Benancio Deeds, MD;  Location: [facility];  Service: Gastroenterology;;   TOTAL KNEE ARTHROPLASTY Left 01/27/2024   Procedure: LEFT TOTAL KNEE ARTHROPLASTY;  Surgeon: Tarry Kos, MD;  Location: [facility];  Service: Orthopedics;  Laterality: Left;   Patient Active Problem List   Diagnosis Date Noted   Status post total left knee replacement 01/27/2024   GERD (gastroesophageal reflux disease) 12/04/2023   History of hepatitis C virus infection  12/04/2023   Erectile dysfunction 12/04/2023   Hypogonadism in male 12/04/2023   MDD (major depressive disorder) 12/04/2023   Insomnia 12/04/2023   Slow urinary stream 12/04/2023   Hyponatremia 01/23/2022   Alcohol withdrawal (HCC) 01/22/2022   Class 1 obesity 01/22/2022   Alcohol withdrawal syndrome without complication (HCC)    Alcohol dependence with withdrawal with complication (HCC) 04/20/2021   Marijuana abuse, continuous 04/20/2021   Pancreatitis, acute 04/10/2021   Hypotension 04/10/2021   Chest pain 04/10/2021   Esophageal varices without bleeding (HCC)    Abnormal CT scan, colon    Benign neoplasm of colon    Bleeding esophageal varices (HCC)    Spondylosis without myelopathy or radiculopathy, cervical region 12/03/2018   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   Post-traumatic osteoarthritis of left knee 12/03/2018   Chronic obstructive pulmonary disease (HCC) 12/02/2018   Upper GI bleed 11/24/2018   Acute blood loss anemia 11/24/2018   Alcohol abuse 11/24/2018   Chronic back pain 11/24/2018   Chronic hepatitis C without hepatic coma (HCC) 10/08/2017   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   Chronic pain of left knee 07/23/2017   Essential hypertension 07/12/2017   OSA (obstructive sleep apnea) 07/12/2017   Class 2 obesity due to excess calories with body mass index (BMI) of 35.0 to 35.9 in adult 07/12/2017   Elevated liver enzymes 07/12/2017   Other chronic pain 07/12/2017   Smoker 07/12/2017   Hyperlipidemia 07/12/2017   Polyarthralgia 07/12/2017   Snoring 06/05/2017    PCP: Delmar Landau, MD  REFERRING PROVIDER: Cristie Hem, PA-C; Dr. Roda Shutters  REFERRING DIAG:  Diagnosis  [phone] (ICD-10-CM) - Status post total left knee replacement    THERAPY DIAG:  Acute pain of left knee  Stiffness of left knee, not elsewhere classified  Difficulty in walking, not elsewhere classified  Primary osteoarthritis of left knee  Rationale for Evaluation and Treatment:  Rehabilitation  ONSET DATE: 01/27/24 s/p  SUBJECTIVE:   SUBJECTIVE STATEMENT: 4-5/10 pain upon presentation. Pt states he will accidentally forget cane around the house sometimes.   EVAL:S/p left knee TKA 01/27/24 per Dr Roda Shutters.; had home health;  followed up with surgeon yesterday had staples removed and steristrips remain. Ar[facility] with [facility].    PERTINENT HISTORY: Old injury years ago left side wrapped up in a machine; that what was caused initial injury to knee.   COPD PAIN:  Are you having pain? Yes: NPRS scale: 5/10 Pain location: left leg Pain description: sore, tight and aching Aggravating factors: stay still too long Relieving factors: moving  PRECAUTIONS: None  RED FLAGS: None   WEIGHT BEARING RESTRICTIONS: No  FALLS:  Has patient fallen in last 6 months? No   OCCUPATION: not working, disability  PLOF: Independent  PATIENT GOALS: get my pain better and get around better  NEXT MD VISIT: 4 weeks  OBJECTIVE:  Note: Objective measures were completed at Evaluation unless otherwise noted.  DIAGNOSTIC FINDINGS:   PATIENT SURVEYS:  LEFS 29/80 36.3%  COGNITION: Overall cognitive status: Within functional limits for tasks assessed  SENSATION: Some numbness at and around incision  EDEMA:  Normal for this time s/p   POSTURE: rounded shoulders, forward head, decreased lumbar lordosis, and weight shift right  PALPATION: General soreness   LOWER EXTREMITY ROM:  Active ROM Right eval Left eval Left 02/20/24  Hip flexion     Hip extension     Hip abduction     Hip adduction     Hip internal rotation     Hip external rotation     Knee flexion 130 90 102  Knee extension 0 -14 -8  Ankle dorsiflexion     Ankle plantarflexion     Ankle inversion     Ankle eversion      (Blank rows = not tested)  LOWER EXTREMITY MMT:  MMT Right eval Left eval  Hip flexion 5 3+  Hip extension    Hip abduction    Hip adduction    Hip internal rotation    Hip  external rotation    Knee flexion    Knee extension 5 3-  Ankle dorsiflexion 5 4-  Ankle plantarflexion    Ankle inversion    Ankle eversion     (Blank rows = not tested)   FUNCTIONAL TESTS:  5 times sit to stand: 27.39 sec using hands to assist up to standing 2 minute walk test: 216 ft with  SPC  GAIT: Distance walked: 216 ft Assistive device utilized: Single point cane Level of assistance: Modified independence Comments: antalgic gait; external rotation of left lower extremity                                                                                                                                TREATMENT DATE: 02/27/2024  Therapeutic Exercise: -Bike partial rotations, 5 minutes, pt cued for pain free ROM -DKTC on green exercise ball 3 sets of 10 reps -Straight leg raise, 1 set of 10 reps -Standing hip  abduction 1 sets 10 reps, bilaterally, pt cued for upright trunk and maintaining of neutral spine  Therapeutic Activity: -Sit to stands, 2 sets of 5 reps, throughout session -Step ups, 4 inch step, BUE support needed, 2 sets of 6, increased pain reported -Lateral step ups, 4 inch steps, BUE support most of the time, 2 set of 6 reps  -Ice pack at the end in seated extension position for 7 minutes  02/20/24 Review of HEP and goals Supine: STM and manual ROM to left knee x 10' to decrease pain and improve soft tissue extensibility and joint mobility -8 to 102 AROM Left knee today Quad set 5" x 10 SAQ's 2 x 10 Recumbent Bike seat 12 x 5' rocking mobility      02/13/24  physical therapy evaluation and HEP instruction   PATIENT EDUCATION:  Education details: Patient educated on exam findings, POC, scope of PT, HEP, and what to expect next visit. Person educated: Patient Education method: Explanation, Demonstration, and Handouts Education comprehension:  verbalized understanding, returned demonstration, verbal cues required, and tactile cues required    HOME  EXERCISE PROGRAM: Access Code: [email or web address] URL: [email or web address] Date: 02/13/2024 Prepared by: [facility] - Rehab  Exercises - Seated Knee Extension Stretch with Chair  - 2 x daily - 7 x weekly - 1 sets - 1 reps - 5 minutes hold - Supine Knee Extension Stretch on Towel Roll  - 2 x daily - 7 x weekly - 1 sets - 1 reps - 5 min hold - Supine Heel Slide  - 1 x daily - 7 x weekly - 1 sets - 10 reps Continue with HHPT exercises and CPM  ASSESSMENT:  CLINICAL IMPRESSION: Patient continues to demonstrate increase pain in L knee, decreased LLE strength, decreased gait quality and balance. Patient also demonstrates decreased ROM of L knee. Patient able to progress dynamic balance and knee stabilization exercises today with step ups and lateral step ups, pt requires UE support. Patient would continue to benefit from skilled physical therapy for increased endurance with ambulation, increased LLE strength, decreased pain in L knee, and improved balance for improved quality of life, improved independence with gait training and continued progress towards therapy goals.       Eval:Patient is a 53 y.o. male who was seen today for physical therapy evaluation and treatment for  Status post total left knee replacement. Patient demonstrates muscle weakness, reduced ROM, and fascial restrictions which are likely contributing to symptoms of pain and are negatively impacting patient ability to perform ADLs and functional mobility tasks. Patient will benefit from skilled physical therapy services to address these deficits to reduce pain and improve level of function with ADLs and functional mobility tasks.     OBJECTIVE IMPAIRMENTS: Abnormal gait, decreased activity tolerance, decreased mobility, difficulty walking, decreased ROM, decreased strength, hypomobility, increased fascial restrictions, impaired perceived functional ability, and pain.   ACTIVITY LIMITATIONS: carrying, lifting, bending, sitting,  standing, squatting, sleeping, stairs, transfers, bed mobility, and locomotion level  PARTICIPATION LIMITATIONS: meal prep, cleaning, driving, shopping, community activity, and yard work  PERSONAL FACTORS: Past/current experiences are also affecting patient's functional outcome.   REHAB POTENTIAL: Good  CLINICAL DECISION MAKING: Evolving/moderate complexity  EVALUATION COMPLEXITY: Moderate   GOALS: Goals reviewed with patient? No  SHORT TERM GOALS: Target date: 02/27/2024 patient will be independent with initial HEP  Baseline: Goal status: In progress  2.  Patient will report 50% improvement overall  Baseline:  Goal status: IN progress   3.  Patient will increase left knee AROM to -8 to 100 to improve gait mechanics walking household distances Baseline: see above Goal status: met  LONG TERM GOALS: Target date: 03/13/2024  Patient will be independent in self management strategies to improve quality of life and functional outcomes.  Baseline:  Goal status: in progress  2.  Patient will report 75% improvement overall  Baseline:  Goal status: IN progress  3.  Patient will increase left knee mobility to -2 to 120 to promote normal navigation of steps; step over step pattern  Baseline: see above Goal status: IN progress  4.   Patient will increase left leg MMT's to 5/5 to allow navigation of steps without gait deviation or loss of balance  Baseline: see above Goal status: IN progress  5.  Patient will improve LEFS score by 11 points to demonstrate improved perceived function  Baseline: 29/80 Goal status: IN progress  6.  Patient will increase distance on 2 MWT to 300 ft with LRAD to improve efficacy with ambulating  community distances  Baseline: 216 ft with SPC Goal status: IN progress   PLAN:  PT FREQUENCY: 2x/week  PT DURATION: 4 weeks  PLANNED INTERVENTIONS: 97164- PT Re-evaluation, 97110-Therapeutic exercises, 97530- Therapeutic activity, 97112-  Neuromuscular re-education, 97535- Self Care, 16109- Manual therapy, [phone]- Gait training, [phone]- Orthotic Fit/training, [phone]- Canalith repositioning, [id number]- Aquatic Therapy, [phone]- Splinting, Patient/Family education, Balance training, Stair training, Taping, Dry Needling, Joint mobilization, Joint manipulation, Spinal manipulation, Spinal mobilization, Scar mobilization, and DME instructions.   PLAN FOR NEXT SESSION: left knee mobility and strength; gait training; check balance  Armond Bertin, PT, DPT [facility] Office: [phone] 5:28 PM, 02/27/24

## 2024-03-04 ENCOUNTER — Encounter (HOSPITAL_COMMUNITY)

## 2024-03-06 ENCOUNTER — Encounter (HOSPITAL_COMMUNITY): Payer: Self-pay

## 2024-03-06 ENCOUNTER — Ambulatory Visit (HOSPITAL_COMMUNITY)

## 2024-03-06 ENCOUNTER — Other Ambulatory Visit: Payer: Self-pay | Admitting: Physician Assistant

## 2024-03-06 DIAGNOSIS — M25562 Pain in left knee: Secondary | ICD-10-CM | POA: Diagnosis not present

## 2024-03-06 DIAGNOSIS — M25662 Stiffness of left knee, not elsewhere classified: Secondary | ICD-10-CM

## 2024-03-06 DIAGNOSIS — R262 Difficulty in walking, not elsewhere classified: Secondary | ICD-10-CM

## 2024-03-06 DIAGNOSIS — M1712 Unilateral primary osteoarthritis, left knee: Secondary | ICD-10-CM

## 2024-03-06 MED ORDER — HYDROCODONE-ACETAMINOPHEN 5-325 MG PO TABS: 1.0000 | ORAL_TABLET | Freq: Two times a day (BID) | ORAL | 0 refills | Status: DC | PRN

## 2024-03-06 NOTE — Therapy (Signed)
 OUTPATIENT PHYSICAL THERAPY LOWER EXTREMITY TREATMENT   Patient Name: BERNIE RANSFORD MRN: [id number] DOB:April 29, 1971, 53 y.o., male Today's Date: 03/06/2024  END OF SESSION:  PT End of Session - 03/06/24 1555     Visit Number 4    Number of Visits 8    Date for PT Re-Evaluation 03/13/24    Authorization Type [employer]    Authorization Time Period no auth needed    PT Start Time 1515    PT Stop Time 1559    PT Time Calculation (min) 44 min    Activity Tolerance Patient tolerated treatment well;Patient limited by pain    Behavior During Therapy [facility] for tasks assessed/performed               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   Constipation    Resolved   COPD (chronic obstructive pulmonary disease) (HCC)    DDD (degenerative disc disease), cervical    Diverticulosis    ED (erectile dysfunction)    Esophageal varices (HCC)    GERD (gastroesophageal reflux disease)    H/O: upper GI bleed    Hepatitis    Hepatitis C antibody positive in blood    resolved   High blood pressure    History of anemia    Internal hemorrhoids    Left inguinal hernia 2020   Lung nodule    Small   Portal hypertensive gastropathy (HCC)    Sleep apnea    trying to get used to his CPAP, not wearing CPAP per SO [name] McAdoo   Umbilical hernia 2020   Past Surgical History:  Procedure Laterality Date   arm surgery     left arm,   COLONOSCOPY WITH PROPOFOL  N/A 01/19/2019   Procedure: COLONOSCOPY WITH PROPOFOL ;  Surgeon: Ace Holder, MD;  Location: [facility];  Service: Gastroenterology;  Laterality: N/A;   ESOPHAGEAL BANDING  11/25/2018   Procedure: ESOPHAGEAL BANDING;  Surgeon: Janel Medford, MD;  Location: [facility];  Service: Endoscopy;;   ESOPHAGEAL BANDING  01/19/2019   Procedure: ESOPHAGEAL BANDING;  Surgeon:  Ace Holder, MD;  Location: [facility];  Service: Gastroenterology;;   ESOPHAGEAL BANDING N/A 01/24/2021   Procedure: ESOPHAGEAL BANDING;  Surgeon: Ace Holder, MD;  Location: [facility];  Service: Gastroenterology;  Laterality: N/A;   ESOPHAGOGASTRODUODENOSCOPY (EGD) WITH PROPOFOL  N/A 11/25/2018   Procedure: ESOPHAGOGASTRODUODENOSCOPY (EGD) WITH PROPOFOL ;  Surgeon: Janel Medford, MD;  Location: [facility];  Service: Endoscopy;  Laterality: N/A;   ESOPHAGOGASTRODUODENOSCOPY (EGD) WITH PROPOFOL  N/A 01/19/2019   Procedure: ESOPHAGOGASTRODUODENOSCOPY (EGD) WITH PROPOFOL ;  Surgeon: Ace Holder, MD;  Location: [facility];  Service: Gastroenterology;  Laterality: N/A;   ESOPHAGOGASTRODUODENOSCOPY (EGD) WITH PROPOFOL  N/A 04/28/2019   Procedure: ESOPHAGOGASTRODUODENOSCOPY (EGD) WITH PROPOFOL ;  Surgeon: Ace Holder, MD;  Location: [facility];  Service: Gastroenterology;  Laterality: N/A;   ESOPHAGOGASTRODUODENOSCOPY (EGD) WITH PROPOFOL  N/A 11/03/2019   Procedure: ESOPHAGOGASTRODUODENOSCOPY (EGD) WITH PROPOFOL ;  Surgeon: Albertina Hugger, MD;  Location: [facility];  Service: Gastroenterology;  Laterality: N/A;   ESOPHAGOGASTRODUODENOSCOPY (EGD) WITH PROPOFOL  N/A 01/24/2021   Procedure: ESOPHAGOGASTRODUODENOSCOPY (EGD) WITH PROPOFOL ;  Surgeon: Ace Holder, MD;  Location: [facility];  Service: Gastroenterology;  Laterality: N/A;   KNEE SURGERY Left    POLYPECTOMY  01/19/2019   Procedure: POLYPECTOMY;  Surgeon: Ace Holder, MD;  Location: [facility];  Service: Gastroenterology;;   TOTAL KNEE ARTHROPLASTY Left 01/27/2024   Procedure: LEFT TOTAL KNEE ARTHROPLASTY;  Surgeon: Wes Hamman, MD;  Location: [facility];  Service: Orthopedics;  Laterality: Left;   Patient Active Problem List   Diagnosis Date Noted   Status post total left knee replacement 01/27/2024   GERD (gastroesophageal reflux disease) 12/04/2023   History of hepatitis C virus  infection 12/04/2023   Erectile dysfunction 12/04/2023   Hypogonadism in male 12/04/2023   MDD (major depressive disorder) 12/04/2023   Insomnia 12/04/2023   Slow urinary stream 12/04/2023   Hyponatremia 01/23/2022   Alcohol withdrawal (HCC) 01/22/2022   Class 1 obesity 01/22/2022   Alcohol withdrawal syndrome without complication (HCC)    Alcohol dependence with withdrawal with complication (HCC) 04/20/2021   Marijuana abuse, continuous 04/20/2021   Pancreatitis, acute 04/10/2021   Hypotension 04/10/2021   Chest pain 04/10/2021   Esophageal varices without bleeding (HCC)    Abnormal CT scan, colon    Benign neoplasm of colon    Bleeding esophageal varices (HCC)    Spondylosis without myelopathy or radiculopathy, cervical region 12/03/2018   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   Post-traumatic osteoarthritis of left knee 12/03/2018   Chronic obstructive pulmonary disease (HCC) 12/02/2018   Upper GI bleed 11/24/2018   Acute blood loss anemia 11/24/2018   Alcohol abuse 11/24/2018   Chronic back pain 11/24/2018   Chronic hepatitis C without hepatic coma (HCC) 10/08/2017   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   Chronic pain of left knee 07/23/2017   Essential hypertension 07/12/2017   OSA (obstructive sleep apnea) 07/12/2017   Class 2 obesity due to excess calories with body mass index (BMI) of 35.0 to 35.9 in adult 07/12/2017   Elevated liver enzymes 07/12/2017   Other chronic pain 07/12/2017   Smoker 07/12/2017   Hyperlipidemia 07/12/2017   Polyarthralgia 07/12/2017   Snoring 06/05/2017    PCP: Jorden Nevin, MD  REFERRING PROVIDER: Sandie Cross, PA-C; Dr. Christiane Cowing  REFERRING DIAG:  Diagnosis  [phone] (ICD-10-CM) - Status post total left knee replacement    THERAPY DIAG:  Acute pain of left knee  Stiffness of left knee, not elsewhere classified  Difficulty in walking, not elsewhere classified  Primary osteoarthritis of left knee  Rationale for Evaluation and  Treatment: Rehabilitation  ONSET DATE: 01/27/24 s/p  SUBJECTIVE:   SUBJECTIVE STATEMENT: 4/10 pain upon presentation, pt has been walking with out cane at the house. Pt states he was sore after last time.    EVAL:S/p left knee TKA 01/27/24 per Dr Christiane Cowing.; had home health;  followed up with surgeon yesterday had staples removed and steristrips remain. Arrives with [facility].    PERTINENT HISTORY: Old injury years ago left side wrapped up in a machine; that what was caused initial injury to knee.   COPD PAIN:  Are you having pain? Yes: NPRS scale: 5/10 Pain location: left leg Pain description: sore, tight and aching Aggravating factors: stay still too long Relieving factors: moving  PRECAUTIONS: None  RED FLAGS: None   WEIGHT BEARING RESTRICTIONS: No  FALLS:  Has patient fallen in last 6 months? No   OCCUPATION: not working, disability  PLOF: Independent  PATIENT GOALS: get my pain better and get around better  NEXT MD VISIT: 4 weeks  OBJECTIVE:  Note: Objective measures were completed at Evaluation unless otherwise noted.  DIAGNOSTIC FINDINGS:   PATIENT SURVEYS:  LEFS 29/80 36.3%  COGNITION: Overall cognitive status:  Within functional limits for tasks assessed     SENSATION: Some numbness at and around incision  EDEMA:  Normal for this time s/p   POSTURE: rounded shoulders, forward head, decreased lumbar lordosis, and weight shift right  PALPATION: General soreness   LOWER EXTREMITY ROM:  Active ROM Right eval Left eval Left 02/20/24  Hip flexion     Hip extension     Hip abduction     Hip adduction     Hip internal rotation     Hip external rotation     Knee flexion 130 90 102  Knee extension 0 -14 -8  Ankle dorsiflexion     Ankle plantarflexion     Ankle inversion     Ankle eversion      (Blank rows = not tested)  LOWER EXTREMITY MMT:  MMT Right eval Left eval  Hip flexion 5 3+  Hip extension    Hip abduction    Hip adduction    Hip  internal rotation    Hip external rotation    Knee flexion    Knee extension 5 3-  Ankle dorsiflexion 5 4-  Ankle plantarflexion    Ankle inversion    Ankle eversion     (Blank rows = not tested)   FUNCTIONAL TESTS:  5 times sit to stand: 27.39 sec using hands to assist up to standing 2 minute walk test: 216 ft with  SPC  GAIT: Distance walked: 216 ft Assistive device utilized: Single point cane Level of assistance: Modified independence Comments: antalgic gait; external rotation of left lower extremity                                                                                                                                TREATMENT DATE: 03/06/2024  Therapeutic Exercise: -Bike full rotations rotations, 4 minutes, pt cued for pain free ROM, seat 11 -DKTC on green exercise ball, 2 minutes, pt cued for end range flexion stretch -Supine bridges, 1 set of 10 reps, 3 second holds, pt cued for foot position -Standing 3 way hip bilateral at // bars, 1 set of 10 reps bilaterally -Knee Drive stretch on 12 inch step, 10 second holds, 10 reps (111 flexion obtained today)  Therapeutic Activity: -Sit to stands, 2 sets of 5 reps, throughout session -Step up and overs, 6 inch step, BUE support needed, 1 sets of 8, increased pain reported -Lateral step ups, 6 inch steps, BUE on occasion time, 1 set of 10 reps -Aero mat walks, tandem and lateral stepping, 2 laps each in // bars -Ice pack at the end in seated extension position for 7 minutes    02/27/2024  Therapeutic Exercise: -Bike partial rotations, 5 minutes, pt cued for pain free ROM -DKTC on green exercise ball 3 sets of 10 reps -Straight leg raise, 1 set of 10 reps -Standing hip  abduction 1 sets 10 reps, bilaterally, pt cued for upright trunk and maintaining  of neutral spine  Therapeutic Activity: -Sit to stands, 2 sets of 5 reps, throughout session -Step ups, 4 inch step, BUE support needed, 2 sets of 6, increased pain  reported -Lateral step ups, 4 inch steps, BUE support most of the time, 2 set of 6 reps  -Ice pack at the end in seated extension position for 7 minutes  02/20/24 Review of HEP and goals Supine: STM and manual ROM to left knee x 10' to decrease pain and improve soft tissue extensibility and joint mobility -8 to 102 AROM Left knee today Quad set 5" x 10 SAQ's 2 x 10 Recumbent Bike seat 12 x 5' rocking mobility      02/13/24  physical therapy evaluation and HEP instruction   PATIENT EDUCATION:  Education details: Patient educated on exam findings, POC, scope of PT, HEP, and what to expect next visit. Person educated: Patient Education method: Explanation, Demonstration, and Handouts Education comprehension: verbalized understanding, returned demonstration, verbal cues required, and tactile cues required    HOME EXERCISE PROGRAM: Access Code: [email or web address] URL: [email or web address] Date: 02/13/2024 Prepared by: [facility] - Rehab  Exercises - Seated Knee Extension Stretch with Chair  - 2 x daily - 7 x weekly - 1 sets - 1 reps - 5 minutes hold - Supine Knee Extension Stretch on Towel Roll  - 2 x daily - 7 x weekly - 1 sets - 1 reps - 5 min hold - Supine Heel Slide  - 1 x daily - 7 x weekly - 1 sets - 10 reps Continue with HHPT exercises and CPM  ASSESSMENT:  CLINICAL IMPRESSION: Patient continues to demonstrate pain in L knee, decreased LE strength, decreased gait quality and balance. Patient able to progress knee flexion to 111 degrees today during knee drive stretch which is improved. Pt also able to progress dynamic balance and core activation exercises today with aero mat walks and STS variation, good performance with verbal cueing for proper form. Pt also demonstrates ability to ambulate with no AD for about 40 feet with near normal gait pattern which is much improved as well, progress as pt tolerates. Patient would continue to benefit from skilled physical therapy for  increased endurance with ambulation, increased LE strength, and improved balance for improved quality of life, improved independence with gait training and continued progress towards therapy goals.    Eval:Patient is a 53 y.o. male who was seen today for physical therapy evaluation and treatment for  Status post total left knee replacement. Patient demonstrates muscle weakness, reduced ROM, and fascial restrictions which are likely contributing to symptoms of pain and are negatively impacting patient ability to perform ADLs and functional mobility tasks. Patient will benefit from skilled physical therapy services to address these deficits to reduce pain and improve level of function with ADLs and functional mobility tasks.     OBJECTIVE IMPAIRMENTS: Abnormal gait, decreased activity tolerance, decreased mobility, difficulty walking, decreased ROM, decreased strength, hypomobility, increased fascial restrictions, impaired perceived functional ability, and pain.   ACTIVITY LIMITATIONS: carrying, lifting, bending, sitting, standing, squatting, sleeping, stairs, transfers, bed mobility, and locomotion level  PARTICIPATION LIMITATIONS: meal prep, cleaning, driving, shopping, community activity, and yard work  PERSONAL FACTORS: Past/current experiences are also affecting patient's functional outcome.   REHAB POTENTIAL: Good  CLINICAL DECISION MAKING: Evolving/moderate complexity  EVALUATION COMPLEXITY: Moderate   GOALS: Goals reviewed with patient? No  SHORT TERM GOALS: Target date: 02/27/2024 patient will be independent with initial HEP  Baseline: Goal status: In progress  2.  Patient will report 50% improvement overall  Baseline:  Goal status: IN progress   3.  Patient will increase left knee AROM to -8 to 100 to improve gait mechanics walking household distances Baseline: see above Goal status: met  LONG TERM GOALS: Target date: 03/13/2024  Patient will be independent in self  management strategies to improve quality of life and functional outcomes.  Baseline:  Goal status: in progress  2.  Patient will report 75% improvement overall  Baseline:  Goal status: IN progress  3.  Patient will increase left knee mobility to -2 to 120 to promote normal navigation of steps; step over step pattern  Baseline: see above Goal status: IN progress  4.   Patient will increase left leg MMT's to 5/5 to allow navigation of steps without gait deviation or loss of balance  Baseline: see above Goal status: IN progress  5.  Patient will improve LEFS score by 11 points to demonstrate improved perceived function  Baseline: 29/80 Goal status: IN progress  6.  Patient will increase distance on 2 MWT to 300 ft with LRAD to improve efficacy with ambulating community distances  Baseline: 216 ft with SPC Goal status: IN progress   PLAN:  PT FREQUENCY: 2x/week  PT DURATION: 4 weeks  PLANNED INTERVENTIONS: 97164- PT Re-evaluation, 97110-Therapeutic exercises, 97530- Therapeutic activity, 97112- Neuromuscular re-education, 97535- Self Care, 78295- Manual therapy, [phone]- Gait training, [phone]- Orthotic Fit/training, [phone]- Canalith repositioning, [id number]- Aquatic Therapy, [phone]- Splinting, Patient/Family education, Balance training, Stair training, Taping, Dry Needling, Joint mobilization, Joint manipulation, Spinal manipulation, Spinal mobilization, Scar mobilization, and DME instructions.   PLAN FOR NEXT SESSION: left knee mobility and strength; gait training; check balance  Armond Bertin, PT, DPT [facility] Office: [phone] 4:03 PM, 03/06/24

## 2024-03-06 NOTE — Telephone Encounter (Signed)
 [facility] sent.

## 2024-03-08 ENCOUNTER — Other Ambulatory Visit: Payer: Self-pay | Admitting: Physician Assistant

## 2024-03-10 ENCOUNTER — Encounter (HOSPITAL_COMMUNITY): Payer: Self-pay

## 2024-03-10 ENCOUNTER — Other Ambulatory Visit (INDEPENDENT_AMBULATORY_CARE_PROVIDER_SITE_OTHER): Payer: Self-pay

## 2024-03-10 ENCOUNTER — Ambulatory Visit (INDEPENDENT_AMBULATORY_CARE_PROVIDER_SITE_OTHER): Admitting: Physician Assistant

## 2024-03-10 ENCOUNTER — Ambulatory Visit (HOSPITAL_COMMUNITY)

## 2024-03-10 DIAGNOSIS — M25662 Stiffness of left knee, not elsewhere classified: Secondary | ICD-10-CM

## 2024-03-10 DIAGNOSIS — M25562 Pain in left knee: Secondary | ICD-10-CM

## 2024-03-10 DIAGNOSIS — R262 Difficulty in walking, not elsewhere classified: Secondary | ICD-10-CM

## 2024-03-10 DIAGNOSIS — Z96652 Presence of left artificial knee joint: Secondary | ICD-10-CM

## 2024-03-10 DIAGNOSIS — M1712 Unilateral primary osteoarthritis, left knee: Secondary | ICD-10-CM

## 2024-03-10 NOTE — Therapy (Signed)
 OUTPATIENT PHYSICAL THERAPY LOWER EXTREMITY TREATMENT   Patient Name: Levi Alvarez MRN: [id number] DOB:04/13/1971, 53 y.o., male Today's Date: 03/10/2024  END OF SESSION:  PT End of Session - 03/10/24 1230     Visit Number 5    Number of Visits 8    Date for PT Re-Evaluation 03/13/24    Authorization Type [employer]    Authorization Time Period no auth needed    PT Start Time 1153    PT Stop Time 1231    PT Time Calculation (min) 38 min    Activity Tolerance Patient tolerated treatment well;Patient limited by pain    Behavior During Therapy WFL for tasks assessed/performed                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   Constipation    Resolved   COPD (chronic obstructive pulmonary disease) (HCC)    DDD (degenerative disc disease), cervical    Diverticulosis    ED (erectile dysfunction)    Esophageal varices (HCC)    GERD (gastroesophageal reflux disease)    H/O: upper GI bleed    Hepatitis    Hepatitis C antibody positive in blood    resolved   High blood pressure    History of anemia    Internal hemorrhoids    Left inguinal hernia 2020   Lung nodule    Small   Portal hypertensive gastropathy (HCC)    Sleep apnea    trying to get used to his CPAP, not wearing CPAP per SO [name] McAdoo   Umbilical hernia 2020   Past Surgical History:  Procedure Laterality Date   arm surgery     left arm,   COLONOSCOPY WITH PROPOFOL  N/A 01/19/2019   Procedure: COLONOSCOPY WITH PROPOFOL ;  Surgeon: Ace Holder, MD;  Location: [facility];  Service: Gastroenterology;  Laterality: N/A;   ESOPHAGEAL BANDING  11/25/2018   Procedure: ESOPHAGEAL BANDING;  Surgeon: Janel Medford, MD;  Location: [facility];  Service: Endoscopy;;   ESOPHAGEAL BANDING  01/19/2019   Procedure: ESOPHAGEAL BANDING;  Surgeon:  Ace Holder, MD;  Location: [facility];  Service: Gastroenterology;;   ESOPHAGEAL BANDING N/A 01/24/2021   Procedure: ESOPHAGEAL BANDING;  Surgeon: Ace Holder, MD;  Location: [facility];  Service: Gastroenterology;  Laterality: N/A;   ESOPHAGOGASTRODUODENOSCOPY (EGD) WITH PROPOFOL  N/A 11/25/2018   Procedure: ESOPHAGOGASTRODUODENOSCOPY (EGD) WITH PROPOFOL ;  Surgeon: Janel Medford, MD;  Location: [facility];  Service: Endoscopy;  Laterality: N/A;   ESOPHAGOGASTRODUODENOSCOPY (EGD) WITH PROPOFOL  N/A 01/19/2019   Procedure: ESOPHAGOGASTRODUODENOSCOPY (EGD) WITH PROPOFOL ;  Surgeon: Ace Holder, MD;  Location: [facility];  Service: Gastroenterology;  Laterality: N/A;   ESOPHAGOGASTRODUODENOSCOPY (EGD) WITH PROPOFOL  N/A 04/28/2019   Procedure: ESOPHAGOGASTRODUODENOSCOPY (EGD) WITH PROPOFOL ;  Surgeon: Ace Holder, MD;  Location: [facility];  Service: Gastroenterology;  Laterality: N/A;   ESOPHAGOGASTRODUODENOSCOPY (EGD) WITH PROPOFOL  N/A 11/03/2019   Procedure: ESOPHAGOGASTRODUODENOSCOPY (EGD) WITH PROPOFOL ;  Surgeon: Albertina Hugger, MD;  Location: [facility];  Service: Gastroenterology;  Laterality: N/A;   ESOPHAGOGASTRODUODENOSCOPY (EGD) WITH PROPOFOL  N/A 01/24/2021   Procedure: ESOPHAGOGASTRODUODENOSCOPY (EGD) WITH PROPOFOL ;  Surgeon: Ace Holder, MD;  Location: [facility];  Service: Gastroenterology;  Laterality: N/A;   KNEE SURGERY Left    POLYPECTOMY  01/19/2019   Procedure: POLYPECTOMY;  Surgeon: Ace Holder, MD;  Location: [facility];  Service: Gastroenterology;;   TOTAL KNEE ARTHROPLASTY Left 01/27/2024   Procedure: LEFT TOTAL KNEE ARTHROPLASTY;  Surgeon: Wes Hamman, MD;  Location: [facility];  Service: Orthopedics;  Laterality: Left;   Patient Active Problem List   Diagnosis Date Noted   Status post total left knee replacement 01/27/2024   GERD (gastroesophageal reflux disease) 12/04/2023   History of hepatitis C virus  infection 12/04/2023   Erectile dysfunction 12/04/2023   Hypogonadism in male 12/04/2023   MDD (major depressive disorder) 12/04/2023   Insomnia 12/04/2023   Slow urinary stream 12/04/2023   Hyponatremia 01/23/2022   Alcohol withdrawal (HCC) 01/22/2022   Class 1 obesity 01/22/2022   Alcohol withdrawal syndrome without complication (HCC)    Alcohol dependence with withdrawal with complication (HCC) 04/20/2021   Marijuana abuse, continuous 04/20/2021   Pancreatitis, acute 04/10/2021   Hypotension 04/10/2021   Chest pain 04/10/2021   Esophageal varices without bleeding (HCC)    Abnormal CT scan, colon    Benign neoplasm of colon    Bleeding esophageal varices (HCC)    Spondylosis without myelopathy or radiculopathy, cervical region 12/03/2018   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   Post-traumatic osteoarthritis of left knee 12/03/2018   Chronic obstructive pulmonary disease (HCC) 12/02/2018   Upper GI bleed 11/24/2018   Acute blood loss anemia 11/24/2018   Alcohol abuse 11/24/2018   Chronic back pain 11/24/2018   Chronic hepatitis C without hepatic coma (HCC) 10/08/2017   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   Chronic pain of left knee 07/23/2017   Essential hypertension 07/12/2017   OSA (obstructive sleep apnea) 07/12/2017   Class 2 obesity due to excess calories with body mass index (BMI) of 35.0 to 35.9 in adult 07/12/2017   Elevated liver enzymes 07/12/2017   Other chronic pain 07/12/2017   Smoker 07/12/2017   Hyperlipidemia 07/12/2017   Polyarthralgia 07/12/2017   Snoring 06/05/2017    PCP: Jorden Nevin, MD  REFERRING PROVIDER: Sandie Cross, PA-C; Dr. Christiane Cowing  REFERRING DIAG:  Diagnosis  [phone] (ICD-10-CM) - Status post total left knee replacement    THERAPY DIAG:  Acute pain of left knee  Stiffness of left knee, not elsewhere classified  Difficulty in walking, not elsewhere classified  Primary osteoarthritis of left knee  Rationale for Evaluation and  Treatment: Rehabilitation  ONSET DATE: 01/27/24 s/p  SUBJECTIVE:   SUBJECTIVE STATEMENT: Pt reports dealing with some clicking in L knee, little agravting not really painful. Pt reports being a little swollen and sore but was not too bad. Pt reports having doctor appointment following therapy concerning his knee.  EVAL:S/p left knee TKA 01/27/24 per Dr Christiane Cowing.; had home health;  followed up with surgeon yesterday had staples removed and steristrips remain. Arrives with [facility].    PERTINENT HISTORY: Old injury years ago left side wrapped up in a machine; that what was caused initial injury to knee.   COPD PAIN:  Are you having pain? Yes: NPRS scale: 5/10 Pain location: left leg Pain description: sore, tight and aching Aggravating factors: stay still too long Relieving factors: moving  PRECAUTIONS: None  RED FLAGS: None   WEIGHT BEARING RESTRICTIONS: No  FALLS:  Has patient fallen in last 6 months? No   OCCUPATION: not working, disability  PLOF: Independent  PATIENT GOALS: get my pain better and get around better  NEXT MD VISIT: 4 weeks  OBJECTIVE:  Note: Objective measures were completed at Evaluation unless otherwise noted.  DIAGNOSTIC FINDINGS:  PATIENT SURVEYS:  LEFS 29/80 36.3%  COGNITION: Overall cognitive status: Within functional limits for tasks assessed     SENSATION: Some numbness at and around incision  EDEMA:  Normal for this time s/p   POSTURE: rounded shoulders, forward head, decreased lumbar lordosis, and weight shift right  PALPATION: General soreness   LOWER EXTREMITY ROM:  Active ROM Right eval Left eval Left 02/20/24  Hip flexion     Hip extension     Hip abduction     Hip adduction     Hip internal rotation     Hip external rotation     Knee flexion 130 90 102  Knee extension 0 -14 -8  Ankle dorsiflexion     Ankle plantarflexion     Ankle inversion     Ankle eversion      (Blank rows = not tested)  LOWER EXTREMITY  MMT:  MMT Right eval Left eval  Hip flexion 5 3+  Hip extension    Hip abduction    Hip adduction    Hip internal rotation    Hip external rotation    Knee flexion    Knee extension 5 3-  Ankle dorsiflexion 5 4-  Ankle plantarflexion    Ankle inversion    Ankle eversion     (Blank rows = not tested)   FUNCTIONAL TESTS:  5 times sit to stand: 27.39 sec using hands to assist up to standing 2 minute walk test: 216 ft with  SPC  GAIT: Distance walked: 216 ft Assistive device utilized: Single point cane Level of assistance: Modified independence Comments: antalgic gait; external rotation of left lower extremity                                                                                                                                TREATMENT DATE: 03/10/2024  Therapeutic Exercise: -Bike full rotations rotations, 5 minutes, pt cued for pain free ROM, seat 10 -DKTC on green exercise ball, 2 minutes, pt cued for end range flexion stretch -Supine bridges, 1 set of 10 reps, 3 second holds, pt cued for foot position -Knee Drive stretch on elevated mat table, 10 second holds, 10 reps (112 flexion obtained today)  Therapeutic Activity: -Sit to stands, 2 sets of 5 reps, throughout session -Step up and overs, 6 inch step, BUE support needed, 1 sets of 8, increased pain reported -Lateral step ups, 6 inch steps, BUE on occasion time, 1 set of 10 reps  -Ice pack to L knee at the end in lying extension position for 5 minutes   03/06/2024  Therapeutic Exercise: -Bike full rotations rotations, 4 minutes, pt cued for pain free ROM, seat 11 -DKTC on green exercise ball, 2 minutes, pt cued for end range flexion stretch -Supine bridges, 2 set of 10 reps, 3 second holds, pt cued for foot position -Standing 3 way hip bilateral at // bars, 1 set of 10 reps bilaterally -Knee  Drive stretch on 12 inch step, 10 second holds, 10 reps (111 flexion obtained today)  Therapeutic Activity: -Sit  to stands, 2 sets of 5 reps, throughout session -Step up and overs, 6 inch step, BUE support needed, 1 sets of 8, increased pain reported -Lateral step ups, 6 inch steps, BUE on occasion time, 1 set of 10 reps -Aero mat walks, tandem and lateral stepping, 2 laps each in // bars -Ice pack at the end in seated extension position for 7 minutes    02/27/2024  Therapeutic Exercise: -Bike partial rotations, 5 minutes, pt cued for pain free ROM -DKTC on green exercise ball 3 sets of 10 reps -Straight leg raise, 1 set of 10 reps -Standing hip  abduction 1 sets 10 reps, bilaterally, pt cued for upright trunk and maintaining of neutral spine  Therapeutic Activity: -Sit to stands, 2 sets of 5 reps, throughout session -Step ups, 4 inch step, BUE support needed, 2 sets of 6, increased pain reported -Lateral step ups, 4 inch steps, BUE support most of the time, 2 set of 6 reps  -Ice pack at the end in seated extension position for 7 minutes  02/20/24 Review of HEP and goals Supine: STM and manual ROM to left knee x 10' to decrease pain and improve soft tissue extensibility and joint mobility -8 to 102 AROM Left knee today Quad set 5" x 10 SAQ's 2 x 10 Recumbent Bike seat 12 x 5' rocking mobility      02/13/24  physical therapy evaluation and HEP instruction   PATIENT EDUCATION:  Education details: Patient educated on exam findings, POC, scope of PT, HEP, and what to expect next visit. Person educated: Patient Education method: Explanation, Demonstration, and Handouts Education comprehension: verbalized understanding, returned demonstration, verbal cues required, and tactile cues required    HOME EXERCISE PROGRAM: Access Code: [email or web address] URL: [email or web address] Date: 02/13/2024 Prepared by: [facility] - Rehab  Exercises - Seated Knee Extension Stretch with Chair  - 2 x daily - 7 x weekly - 1 sets - 1 reps - 5 minutes hold - Supine Knee Extension Stretch on Towel Roll  - 2 x  daily - 7 x weekly - 1 sets - 1 reps - 5 min hold - Supine Heel Slide  - 1 x daily - 7 x weekly - 1 sets - 10 reps Continue with HHPT exercises and CPM  ASSESSMENT:  CLINICAL IMPRESSION: Patient continues to demonstrate left knee pain, decreased LLE strength, decreased gait quality and balance. Patient also demonstrates obtaining 112 degrees of flexion and knee extension to 0 degrees. Patient able to continue dynamic balance and core activation exercises today with step up and overs and lateral step up and overs, good performance with minimal verbal cueing required. Patient would continue to benefit from skilled physical therapy for increased endurance with ambulation, increased LE strength, and improved balance for improved quality of life, improved quality with gait training and continued progress towards therapy goals.     Eval:Patient is a 53 y.o. male who was seen today for physical therapy evaluation and treatment for  Status post total left knee replacement. Patient demonstrates muscle weakness, reduced ROM, and fascial restrictions which are likely contributing to symptoms of pain and are negatively impacting patient ability to perform ADLs and functional mobility tasks. Patient will benefit from skilled physical therapy services to address these deficits to reduce pain and improve level of function with ADLs and functional mobility tasks.     OBJECTIVE IMPAIRMENTS: Abnormal gait, decreased  activity tolerance, decreased mobility, difficulty walking, decreased ROM, decreased strength, hypomobility, increased fascial restrictions, impaired perceived functional ability, and pain.   ACTIVITY LIMITATIONS: carrying, lifting, bending, sitting, standing, squatting, sleeping, stairs, transfers, bed mobility, and locomotion level  PARTICIPATION LIMITATIONS: meal prep, cleaning, driving, shopping, community activity, and yard work  PERSONAL FACTORS: Past/current experiences are also affecting  patient's functional outcome.   REHAB POTENTIAL: Good  CLINICAL DECISION MAKING: Evolving/moderate complexity  EVALUATION COMPLEXITY: Moderate   GOALS: Goals reviewed with patient? No  SHORT TERM GOALS: Target date: 02/27/2024 patient will be independent with initial HEP  Baseline: Goal status: In progress  2.  Patient will report 50% improvement overall  Baseline:  Goal status: IN progress   3.  Patient will increase left knee AROM to -8 to 100 to improve gait mechanics walking household distances Baseline: see above Goal status: met  LONG TERM GOALS: Target date: 03/13/2024  Patient will be independent in self management strategies to improve quality of life and functional outcomes.  Baseline:  Goal status: in progress  2.  Patient will report 75% improvement overall  Baseline:  Goal status: IN progress  3.  Patient will increase left knee mobility to -2 to 120 to promote normal navigation of steps; step over step pattern  Baseline: see above Goal status: IN progress  4.   Patient will increase left leg MMT's to 5/5 to allow navigation of steps without gait deviation or loss of balance  Baseline: see above Goal status: IN progress  5.  Patient will improve LEFS score by 11 points to demonstrate improved perceived function  Baseline: 29/80 Goal status: IN progress  6.  Patient will increase distance on 2 MWT to 300 ft with LRAD to improve efficacy with ambulating community distances  Baseline: 216 ft with SPC Goal status: IN progress   PLAN:  PT FREQUENCY: 2x/week  PT DURATION: 4 weeks  PLANNED INTERVENTIONS: 97164- PT Re-evaluation, 97110-Therapeutic exercises, 97530- Therapeutic activity, 97112- Neuromuscular re-education, 97535- Self Care, 09381- Manual therapy, [phone]- Gait training, [phone]- Orthotic Fit/training, [phone]- Canalith repositioning, [id number]- Aquatic Therapy, [phone]- Splinting, Patient/Family education, Balance training, Stair training,  Taping, Dry Needling, Joint mobilization, Joint manipulation, Spinal manipulation, Spinal mobilization, Scar mobilization, and DME instructions.   PLAN FOR NEXT SESSION: left knee mobility and strength; gait training; check balance  Armond Bertin, PT, DPT [facility] Office: [phone] 12:35 PM, 03/10/24

## 2024-03-10 NOTE — Progress Notes (Signed)
 Post-Op Visit Note   Patient: Levi Alvarez           Date of Birth: 12/25/1970           MRN: [id number] Visit Date: 03/10/2024 PCP: Tobi Fortes, MD   Assessment & Plan:  Chief Complaint:  Chief Complaint  Patient presents with   Left Knee - Follow-up    Left total knee arthroplasty 01/27/2024   Visit Diagnoses:  1. Status post total left knee replacement     Plan: Patient is a pleasant 53 year old gentleman who comes in today 7 weeks status post left total knee replacement 01/27/2024.  He has been doing well.  Has finished his blood thinners.  He is in physical therapy and is ambulating with a single-point cane.  Taking Norco as needed for pain.  Examination of the left knee reveals a fully healed surgical scar without complication.  Range of motion 0 to 105 degrees.  He is stable to valgus varus stress.  He is neurovascularly intact distally.  At this point, he will continue with PT.  He will follow-up with us  in 5 weeks when he is 12 weeks out from surgery.  Call with concerns or questions.  Follow-Up Instructions: Return in about 5 weeks (around 04/14/2024).   Orders:  Orders Placed This Encounter  Procedures   XR Knee 1-2 Views Left   No orders of the defined types were placed in this encounter.   Imaging: XR Knee 1-2 Views Left Result Date: 03/10/2024 X-rays demonstrate a well-seated prosthesis without complication    PMFS History: Patient Active Problem List   Diagnosis Date Noted   Status post total left knee replacement 01/27/2024   GERD (gastroesophageal reflux disease) 12/04/2023   History of hepatitis C virus infection 12/04/2023   Erectile dysfunction 12/04/2023   Hypogonadism in male 12/04/2023   MDD (major depressive disorder) 12/04/2023   Insomnia 12/04/2023   Slow urinary stream 12/04/2023   Hyponatremia 01/23/2022   Alcohol withdrawal (HCC) 01/22/2022   Class 1 obesity 01/22/2022   Alcohol withdrawal syndrome without complication (HCC)     Alcohol dependence with withdrawal with complication (HCC) 04/20/2021   Marijuana abuse, continuous 04/20/2021   Pancreatitis, acute 04/10/2021   Hypotension 04/10/2021   Chest pain 04/10/2021   Esophageal varices without bleeding (HCC)    Abnormal CT scan, colon    Benign neoplasm of colon    Bleeding esophageal varices (HCC)    Spondylosis without myelopathy or radiculopathy, cervical region 12/03/2018   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   Post-traumatic osteoarthritis of left knee 12/03/2018   Chronic obstructive pulmonary disease (HCC) 12/02/2018   Upper GI bleed 11/24/2018   Acute blood loss anemia 11/24/2018   Alcohol abuse 11/24/2018   Chronic back pain 11/24/2018   Chronic hepatitis C without hepatic coma (HCC) 10/08/2017   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   Chronic pain of left knee 07/23/2017   Essential hypertension 07/12/2017   OSA (obstructive sleep apnea) 07/12/2017   Class 2 obesity due to excess calories with body mass index (BMI) of 35.0 to 35.9 in adult 07/12/2017   Elevated liver enzymes 07/12/2017   Other chronic pain 07/12/2017   Smoker 07/12/2017   Hyperlipidemia 07/12/2017   Polyarthralgia 07/12/2017   Snoring 06/05/2017   Past Medical History:  Diagnosis Date   Alcoholism (HCC)    Blood transfusion without reported diagnosis    jan, 2020 3 units PRBC   Cirrhosis (HCC)    secondary to alcohol and hep  C   Constipation    Resolved   COPD (chronic obstructive pulmonary disease) (HCC)    DDD (degenerative disc disease), cervical    Diverticulosis    ED (erectile dysfunction)    Esophageal varices (HCC)    GERD (gastroesophageal reflux disease)    H/O: upper GI bleed    Hepatitis    Hepatitis C antibody positive in blood    resolved   High blood pressure    History of anemia    Internal hemorrhoids    Left inguinal hernia 2020   Lung nodule    Small   Portal hypertensive gastropathy (HCC)    Sleep apnea    trying to get used to his  CPAP, not wearing CPAP per SO [name] McAdoo   Umbilical hernia 2020    Family History  Problem Relation Age of Onset   Colitis Mother    Arthritis Mother    Dementia Mother    Cancer Father        lymphoma?   Cancer Paternal Grandfather        bone   Heart disease Neg Hx    Stroke Neg Hx    Diabetes Neg Hx    Colon cancer Neg Hx    Esophageal cancer Neg Hx    Stomach cancer Neg Hx    Rectal cancer Neg Hx     Past Surgical History:  Procedure Laterality Date   arm surgery     left arm,   COLONOSCOPY WITH PROPOFOL  N/A 01/19/2019   Procedure: COLONOSCOPY WITH PROPOFOL ;  Surgeon: Ace Holder, MD;  Location: [facility];  Service: Gastroenterology;  Laterality: N/A;   ESOPHAGEAL BANDING  11/25/2018   Procedure: ESOPHAGEAL BANDING;  Surgeon: Janel Medford, MD;  Location: [facility];  Service: Endoscopy;;   ESOPHAGEAL BANDING  01/19/2019   Procedure: ESOPHAGEAL BANDING;  Surgeon: Ace Holder, MD;  Location: [facility];  Service: Gastroenterology;;   ESOPHAGEAL BANDING N/A 01/24/2021   Procedure: ESOPHAGEAL BANDING;  Surgeon: Ace Holder, MD;  Location: [facility];  Service: Gastroenterology;  Laterality: N/A;   ESOPHAGOGASTRODUODENOSCOPY (EGD) WITH PROPOFOL  N/A 11/25/2018   Procedure: ESOPHAGOGASTRODUODENOSCOPY (EGD) WITH PROPOFOL ;  Surgeon: Janel Medford, MD;  Location: [facility];  Service: Endoscopy;  Laterality: N/A;   ESOPHAGOGASTRODUODENOSCOPY (EGD) WITH PROPOFOL  N/A 01/19/2019   Procedure: ESOPHAGOGASTRODUODENOSCOPY (EGD) WITH PROPOFOL ;  Surgeon: Ace Holder, MD;  Location: [facility];  Service: Gastroenterology;  Laterality: N/A;   ESOPHAGOGASTRODUODENOSCOPY (EGD) WITH PROPOFOL  N/A 04/28/2019   Procedure: ESOPHAGOGASTRODUODENOSCOPY (EGD) WITH PROPOFOL ;  Surgeon: Ace Holder, MD;  Location: [facility];  Service: Gastroenterology;  Laterality: N/A;   ESOPHAGOGASTRODUODENOSCOPY (EGD) WITH PROPOFOL  N/A 11/03/2019   Procedure:  ESOPHAGOGASTRODUODENOSCOPY (EGD) WITH PROPOFOL ;  Surgeon: Albertina Hugger, MD;  Location: [facility];  Service: Gastroenterology;  Laterality: N/A;   ESOPHAGOGASTRODUODENOSCOPY (EGD) WITH PROPOFOL  N/A 01/24/2021   Procedure: ESOPHAGOGASTRODUODENOSCOPY (EGD) WITH PROPOFOL ;  Surgeon: Ace Holder, MD;  Location: [facility];  Service: Gastroenterology;  Laterality: N/A;   KNEE SURGERY Left    POLYPECTOMY  01/19/2019   Procedure: POLYPECTOMY;  Surgeon: Ace Holder, MD;  Location: [facility];  Service: Gastroenterology;;   TOTAL KNEE ARTHROPLASTY Left 01/27/2024   Procedure: LEFT TOTAL KNEE ARTHROPLASTY;  Surgeon: Wes Hamman, MD;  Location: [facility];  Service: Orthopedics;  Laterality: Left;   Social History   Occupational History   Occupation: disabled  Tobacco Use   Smoking status: Every Day    Current packs/day: 0.50  Average packs/day: 0.5 packs/day for 30.0 years (15.0 ttl pk-yrs)    Types: Cigarettes    Passive exposure: Current   Smokeless tobacco: Former    Types: Engineer, drilling   Vaping status: Never Used  Substance and Sexual Activity   Alcohol use: Not Currently    Alcohol/week: 36.0 standard drinks of alcohol    Types: 36 Cans of beer per week    Comment: quit March 2023   Drug use: Yes    Types: Marijuana    Comment: daily use - informed to withhold 24-48 hrs prior to procedure   Sexual activity: Yes    Partners: Female

## 2024-03-13 ENCOUNTER — Encounter (HOSPITAL_COMMUNITY): Payer: Self-pay

## 2024-03-13 ENCOUNTER — Ambulatory Visit (HOSPITAL_COMMUNITY): Attending: Physician Assistant

## 2024-03-13 DIAGNOSIS — M25562 Pain in left knee: Secondary | ICD-10-CM | POA: Diagnosis present

## 2024-03-13 DIAGNOSIS — M25662 Stiffness of left knee, not elsewhere classified: Secondary | ICD-10-CM | POA: Diagnosis present

## 2024-03-13 DIAGNOSIS — R262 Difficulty in walking, not elsewhere classified: Secondary | ICD-10-CM | POA: Diagnosis present

## 2024-03-13 DIAGNOSIS — M1712 Unilateral primary osteoarthritis, left knee: Secondary | ICD-10-CM | POA: Diagnosis present

## 2024-03-13 NOTE — Therapy (Signed)
 OUTPATIENT PHYSICAL THERAPY LOWER EXTREMITY TREATMENT   Patient Name: Levi Alvarez MRN: [id number] DOB:1971-05-24, 53 y.o., male Today's Date: 03/13/2024  END OF SESSION:  PT End of Session - 03/13/24 1526     Visit Number 6    Number of Visits 8    Date for PT Re-Evaluation 03/27/24    Authorization Type [employer]    Authorization Time Period no auth needed    PT Start Time 1523    PT Stop Time 1555    PT Time Calculation (min) 32 min    Activity Tolerance Patient tolerated treatment well;Patient limited by pain    Behavior During Therapy WFL for tasks assessed/performed                 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   Constipation    Resolved   COPD (chronic obstructive pulmonary disease) (HCC)    DDD (degenerative disc disease), cervical    Diverticulosis    ED (erectile dysfunction)    Esophageal varices (HCC)    GERD (gastroesophageal reflux disease)    H/O: upper GI bleed    Hepatitis    Hepatitis C antibody positive in blood    resolved   High blood pressure    History of anemia    Internal hemorrhoids    Left inguinal hernia 2020   Lung nodule    Small   Portal hypertensive gastropathy (HCC)    Sleep apnea    trying to get used to his CPAP, not wearing CPAP per SO [name] McAdoo   Umbilical hernia 2020   Past Surgical History:  Procedure Laterality Date   arm surgery     left arm,   COLONOSCOPY WITH PROPOFOL  N/A 01/19/2019   Procedure: COLONOSCOPY WITH PROPOFOL ;  Surgeon: Ace Holder, MD;  Location: [facility];  Service: Gastroenterology;  Laterality: N/A;   ESOPHAGEAL BANDING  11/25/2018   Procedure: ESOPHAGEAL BANDING;  Surgeon: Janel Medford, MD;  Location: [facility];  Service: Endoscopy;;   ESOPHAGEAL BANDING  01/19/2019   Procedure: ESOPHAGEAL BANDING;  Surgeon:  Ace Holder, MD;  Location: [facility];  Service: Gastroenterology;;   ESOPHAGEAL BANDING N/A 01/24/2021   Procedure: ESOPHAGEAL BANDING;  Surgeon: Ace Holder, MD;  Location: [facility];  Service: Gastroenterology;  Laterality: N/A;   ESOPHAGOGASTRODUODENOSCOPY (EGD) WITH PROPOFOL  N/A 11/25/2018   Procedure: ESOPHAGOGASTRODUODENOSCOPY (EGD) WITH PROPOFOL ;  Surgeon: Janel Medford, MD;  Location: [facility];  Service: Endoscopy;  Laterality: N/A;   ESOPHAGOGASTRODUODENOSCOPY (EGD) WITH PROPOFOL  N/A 01/19/2019   Procedure: ESOPHAGOGASTRODUODENOSCOPY (EGD) WITH PROPOFOL ;  Surgeon: Ace Holder, MD;  Location: [facility];  Service: Gastroenterology;  Laterality: N/A;   ESOPHAGOGASTRODUODENOSCOPY (EGD) WITH PROPOFOL  N/A 04/28/2019   Procedure: ESOPHAGOGASTRODUODENOSCOPY (EGD) WITH PROPOFOL ;  Surgeon: Ace Holder, MD;  Location: [facility];  Service: Gastroenterology;  Laterality: N/A;   ESOPHAGOGASTRODUODENOSCOPY (EGD) WITH PROPOFOL  N/A 11/03/2019   Procedure: ESOPHAGOGASTRODUODENOSCOPY (EGD) WITH PROPOFOL ;  Surgeon: Albertina Hugger, MD;  Location: [facility];  Service: Gastroenterology;  Laterality: N/A;   ESOPHAGOGASTRODUODENOSCOPY (EGD) WITH PROPOFOL  N/A 01/24/2021   Procedure: ESOPHAGOGASTRODUODENOSCOPY (EGD) WITH PROPOFOL ;  Surgeon: Ace Holder, MD;  Location: [facility];  Service: Gastroenterology;  Laterality: N/A;   KNEE SURGERY Left    POLYPECTOMY  01/19/2019   Procedure:  POLYPECTOMY;  Surgeon: Ace Holder, MD;  Location: [facility];  Service: Gastroenterology;;   TOTAL KNEE ARTHROPLASTY Left 01/27/2024   Procedure: LEFT TOTAL KNEE ARTHROPLASTY;  Surgeon: Wes Hamman, MD;  Location: [facility];  Service: Orthopedics;  Laterality: Left;   Patient Active Problem List   Diagnosis Date Noted   Status post total left knee replacement 01/27/2024   GERD (gastroesophageal reflux disease) 12/04/2023   History of hepatitis C virus  infection 12/04/2023   Erectile dysfunction 12/04/2023   Hypogonadism in male 12/04/2023   MDD (major depressive disorder) 12/04/2023   Insomnia 12/04/2023   Slow urinary stream 12/04/2023   Hyponatremia 01/23/2022   Alcohol withdrawal (HCC) 01/22/2022   Class 1 obesity 01/22/2022   Alcohol withdrawal syndrome without complication (HCC)    Alcohol dependence with withdrawal with complication (HCC) 04/20/2021   Marijuana abuse, continuous 04/20/2021   Pancreatitis, acute 04/10/2021   Hypotension 04/10/2021   Chest pain 04/10/2021   Esophageal varices without bleeding (HCC)    Abnormal CT scan, colon    Benign neoplasm of colon    Bleeding esophageal varices (HCC)    Spondylosis without myelopathy or radiculopathy, cervical region 12/03/2018   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   Post-traumatic osteoarthritis of left knee 12/03/2018   Chronic obstructive pulmonary disease (HCC) 12/02/2018   Upper GI bleed 11/24/2018   Acute blood loss anemia 11/24/2018   Alcohol abuse 11/24/2018   Chronic back pain 11/24/2018   Chronic hepatitis C without hepatic coma (HCC) 10/08/2017   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   Chronic pain of left knee 07/23/2017   Essential hypertension 07/12/2017   OSA (obstructive sleep apnea) 07/12/2017   Class 2 obesity due to excess calories with body mass index (BMI) of 35.0 to 35.9 in adult 07/12/2017   Elevated liver enzymes 07/12/2017   Other chronic pain 07/12/2017   Smoker 07/12/2017   Hyperlipidemia 07/12/2017   Polyarthralgia 07/12/2017   Snoring 06/05/2017    PCP: Jorden Nevin, MD  REFERRING PROVIDER: Sandie Cross, PA-C; Dr. Christiane Cowing  REFERRING DIAG:  Diagnosis  [phone] (ICD-10-CM) - Status post total left knee replacement    THERAPY DIAG:  Acute pain of left knee  Stiffness of left knee, not elsewhere classified  Difficulty in walking, not elsewhere classified  Primary osteoarthritis of left knee  Rationale for Evaluation and  Treatment: Rehabilitation  ONSET DATE: 01/27/24 s/p  SUBJECTIVE:   SUBJECTIVE STATEMENT: Pt reports has not been using cane for 3 days since doctor said he did not have to use it. States he feels he has gotten about 50% better. Pt admits that the clicking and popping can be annoying, hoping that strengthening will make the clicking go away.     EVAL:S/p left knee TKA 01/27/24 per Dr Christiane Cowing.; had home health;  followed up with surgeon yesterday had staples removed and steristrips remain. Arrives with [facility].    PERTINENT HISTORY: Old injury years ago left side wrapped up in a machine; that what was caused initial injury to knee.   COPD PAIN:  Are you having pain? Yes: NPRS scale: 5/10 Pain location: left leg Pain description: sore, tight and aching Aggravating factors: stay still too long Relieving factors: moving  PRECAUTIONS: None  RED FLAGS: None   WEIGHT BEARING RESTRICTIONS: No  FALLS:  Has patient fallen in last 6 months? No   OCCUPATION: not working, disability  PLOF: Independent  PATIENT GOALS: get my pain better and get around better  NEXT MD VISIT: 4 weeks  OBJECTIVE:  Note: Objective measures were completed at Evaluation unless otherwise noted.  DIAGNOSTIC FINDINGS:   PATIENT SURVEYS:  LEFS 29/80 36.3%  COGNITION: Overall cognitive status: Within functional limits for tasks assessed     SENSATION: Some numbness at and around incision  EDEMA:  Normal for this time s/p   POSTURE: rounded shoulders, forward head, decreased lumbar lordosis, and weight shift right  PALPATION: General soreness   LOWER EXTREMITY ROM:  Active ROM Right eval Left eval Left 02/20/24 Left 03/13/24  Hip flexion      Hip extension      Hip abduction      Hip adduction      Hip internal rotation      Hip external rotation      Knee flexion 130 90 102 117  Knee extension 0 -14 -8 -2  Ankle dorsiflexion      Ankle plantarflexion      Ankle inversion      Ankle eversion        (Blank rows = not tested)  LOWER EXTREMITY MMT:  MMT Right eval Left eval Left 03/13/24  Hip flexion 5 3+   Hip extension     Hip abduction     Hip adduction     Hip internal rotation     Hip external rotation     Knee flexion     Knee extension 5 3- 4+  Ankle dorsiflexion 5 4- 5  Ankle plantarflexion     Ankle inversion     Ankle eversion      (Blank rows = not tested)   FUNCTIONAL TESTS:  5 times sit to stand: 27.39 sec using hands to assist up to standing 2 minute walk test: 216 ft with  SPC  GAIT: Distance walked: 216 ft Assistive device utilized: Single point cane Level of assistance: Modified independence Comments: antalgic gait; external rotation of left lower extremity                                                                                                                                TREATMENT DATE: 03/13/2024  PT Reassessment: ROM, strength, , LEFS  Therapeutic Exercise: -Bike full rotations rotations, 5 minutes, pt cued for pain free ROM, seat 10 -Knee Drive stretch on elevated mat table, 10 second holds, 10 reps (112 flexion obtained today)  Therapeutic Activity: -Sit to stands, 2 sets of 5 reps, throughout session -Stair navigation, 1 bout 4 stairs, reciprocating gait    03/10/2024  Therapeutic Exercise: -Bike full rotations rotations, 5 minutes, pt cued for pain free ROM, seat 10 -DKTC on green exercise ball, 2 minutes, pt cued for end range flexion stretch -Supine bridges, 1 set of 10 reps, 3 second holds, pt cued for foot position -Knee Drive stretch on elevated mat table, 10 second holds, 10 reps (112 flexion obtained today)  Therapeutic Activity: -Sit to stands, 2 sets of 5 reps, throughout session -Step up  and overs, 6 inch step, BUE support needed, 1 sets of 8, increased pain reported -Lateral step ups, 6 inch steps, BUE on occasion time, 1 set of 10 reps  -Ice pack to L knee at the end in lying extension position for 5  minutes   03/06/2024  Therapeutic Exercise: -Bike full rotations rotations, 4 minutes, pt cued for pain free ROM, seat 11 -DKTC on green exercise ball, 2 minutes, pt cued for end range flexion stretch -Supine bridges, 2 set of 10 reps, 3 second holds, pt cued for foot position -Standing 3 way hip bilateral at // bars, 1 set of 10 reps bilaterally -Knee Drive stretch on 12 inch step, 10 second holds, 10 reps (111 flexion obtained today)  Therapeutic Activity: -Sit to stands, 2 sets of 5 reps, throughout session -Step up and overs, 6 inch step, BUE support needed, 1 sets of 8, increased pain reported -Lateral step ups, 6 inch steps, BUE on occasion time, 1 set of 10 reps -Aero mat walks, tandem and lateral stepping, 2 laps each in // bars -Ice pack at the end in seated extension position for 7 minutes    02/27/2024  Therapeutic Exercise: -Bike partial rotations, 5 minutes, pt cued for pain free ROM -DKTC on green exercise ball 3 sets of 10 reps -Straight leg raise, 1 set of 10 reps -Standing hip  abduction 1 sets 10 reps, bilaterally, pt cued for upright trunk and maintaining of neutral spine  Therapeutic Activity: -Sit to stands, 2 sets of 5 reps, throughout session -Step ups, 4 inch step, BUE support needed, 2 sets of 6, increased pain reported -Lateral step ups, 4 inch steps, BUE support most of the time, 2 set of 6 reps  -Ice pack at the end in seated extension position for 7 minutes  02/20/24 Review of HEP and goals Supine: STM and manual ROM to left knee x 10' to decrease pain and improve soft tissue extensibility and joint mobility -8 to 102 AROM Left knee today Quad set 5" x 10 SAQ's 2 x 10 Recumbent Bike seat 12 x 5' rocking mobility      02/13/24  physical therapy evaluation and HEP instruction   PATIENT EDUCATION:  Education details: Patient educated on exam findings, POC, scope of PT, HEP, and what to expect next visit. Person educated: Patient Education  method: Explanation, Demonstration, and Handouts Education comprehension: verbalized understanding, returned demonstration, verbal cues required, and tactile cues required    HOME EXERCISE PROGRAM: Access Code: [email or web address] URL: [email or web address] Date: 02/13/2024 Prepared by: [facility] - Rehab  Exercises - Seated Knee Extension Stretch with Chair  - 2 x daily - 7 x weekly - 1 sets - 1 reps - 5 minutes hold - Supine Knee Extension Stretch on Towel Roll  - 2 x daily - 7 x weekly - 1 sets - 1 reps - 5 min hold - Supine Heel Slide  - 1 x daily - 7 x weekly - 1 sets - 10 reps Continue with HHPT exercises and CPM  ASSESSMENT:  CLINICAL IMPRESSION: Patient continues to demonstrate improved LE strength, improved gait quality/independence and balance. Patient also demonstrates increased endurance with aerobic based exercise during today's session with meeting goal with no AD needed. Patient able to meet 6/9 therapy goals since the start of therapy. Pt demonstrates good progress towards remaining therapy goals but based on pt pecieved rehabilitation pt would benefit for 2 more weeks of therapy with 1 visit per week. Patient would continue to benefit  from skilled physical therapy for increased endurance with ambulation, increased LLE strength, Left knee ROM, and improved balance for improved quality of life, improved independence with gait training and continued progress towards therapy goals.      Eval:Patient is a 53 y.o. male who was seen today for physical therapy evaluation and treatment for  Status post total left knee replacement. Patient demonstrates muscle weakness, reduced ROM, and fascial restrictions which are likely contributing to symptoms of pain and are negatively impacting patient ability to perform ADLs and functional mobility tasks. Patient will benefit from skilled physical therapy services to address these deficits to reduce pain and improve level of function with ADLs  and functional mobility tasks.     OBJECTIVE IMPAIRMENTS: Abnormal gait, decreased activity tolerance, decreased mobility, difficulty walking, decreased ROM, decreased strength, hypomobility, increased fascial restrictions, impaired perceived functional ability, and pain.   ACTIVITY LIMITATIONS: carrying, lifting, bending, sitting, standing, squatting, sleeping, stairs, transfers, bed mobility, and locomotion level  PARTICIPATION LIMITATIONS: meal prep, cleaning, driving, shopping, community activity, and yard work  PERSONAL FACTORS: Past/current experiences are also affecting patient's functional outcome.   REHAB POTENTIAL: Good  CLINICAL DECISION MAKING: Evolving/moderate complexity  EVALUATION COMPLEXITY: Moderate   GOALS: Goals reviewed with patient? No  SHORT TERM GOALS: Target date: 02/27/2024 patient will be independent with initial HEP  Baseline: Goal status: MET  2.  Patient will report 50% improvement overall  Baseline:  Goal status: MET   3.  Patient will increase left knee AROM to -8 to 100 to improve gait mechanics walking household distances Baseline: see above Goal status: met  LONG TERM GOALS: Target date: 03/13/2024  Patient will be independent in self management strategies to improve quality of life and functional outcomes.  Baseline:  Goal status: MET  2.  Patient will report 75% improvement overall  Baseline:  Goal status: IN progress  3.  Patient will increase left knee mobility to -2 to 120 to promote normal navigation of steps; step over step pattern  Baseline: see above Goal status: IN progress  4.   Patient will increase left leg MMT's to 5/5 to allow navigation of steps without gait deviation or loss of balance  Baseline: see above Goal status: IN progress  5.  Patient will improve LEFS score by 11 points to demonstrate improved perceived function  Baseline: 29/80, 70/80 03/13/24 Goal status: MET  6.  Patient will increase  distance on 2 MWT to 300 ft with LRAD to improve efficacy with ambulating community distances  Baseline: 216 ft with SPC, 03/13/24: 506 feet no AD Goal status: MET   PLAN:  PT FREQUENCY: 2x/week  PT DURATION: 4 weeks  PLANNED INTERVENTIONS: 97164- PT Re-evaluation, 97110-Therapeutic exercises, 97530- Therapeutic activity, 97112- Neuromuscular re-education, 97535- Self Care, 16109- Manual therapy, [phone]- Gait training, [phone]- Orthotic Fit/training, [phone]- Canalith repositioning, [id number]- Aquatic Therapy, [phone]- Splinting, Patient/Family education, Balance training, Stair training, Taping, Dry Needling, Joint mobilization, Joint manipulation, Spinal manipulation, Spinal mobilization, Scar mobilization, and DME instructions.   PLAN FOR NEXT SESSION: left knee mobility and strength; gait training; check balance, seeking 2 additional visits to address remaining therapy goals  Armond Bertin, PT, DPT [facility] Office: [phone] 5:00 PM, 03/13/24

## 2024-03-17 ENCOUNTER — Encounter (HOSPITAL_COMMUNITY)

## 2024-03-18 ENCOUNTER — Telehealth (HOSPITAL_COMMUNITY): Payer: Self-pay

## 2024-03-18 ENCOUNTER — Encounter (HOSPITAL_COMMUNITY)

## 2024-03-18 NOTE — Telephone Encounter (Signed)
 Left message regarding missed appointment today.  Requested call back to reschedule.  10:37 AM, 03/18/24 Michaele Amundson Small Stephanie Littman MPT [facility] [place] [phone]

## 2024-03-20 ENCOUNTER — Encounter: Payer: Self-pay | Admitting: Orthopaedic Surgery

## 2024-03-23 ENCOUNTER — Encounter: Payer: Self-pay | Admitting: Internal Medicine

## 2024-03-23 ENCOUNTER — Other Ambulatory Visit: Payer: Self-pay

## 2024-03-23 MED ORDER — TRAZODONE HCL 50 MG PO TABS: 50.0000 mg | ORAL_TABLET | Freq: Every day | ORAL | 6 refills | Status: DC

## 2024-03-23 NOTE — Telephone Encounter (Signed)
 Refills sent to pharmacy.

## 2024-03-24 ENCOUNTER — Encounter (HOSPITAL_COMMUNITY)

## 2024-03-26 MED ORDER — TRAZODONE HCL 50 MG PO TABS: 50.0000 mg | ORAL_TABLET | Freq: Every day | ORAL | 1 refills | Status: DC

## 2024-03-26 MED ORDER — TESTOSTERONE CYPIONATE 200 MG/ML IM SOLN: 100.0000 mg | INTRAMUSCULAR | 0 refills | Status: DC

## 2024-03-28 ENCOUNTER — Other Ambulatory Visit: Payer: Self-pay | Admitting: Physician Assistant

## 2024-03-31 ENCOUNTER — Encounter (HOSPITAL_COMMUNITY)

## 2024-04-13 ENCOUNTER — Encounter: Payer: Self-pay | Admitting: Gastroenterology

## 2024-04-13 ENCOUNTER — Encounter: Payer: Self-pay | Admitting: Internal Medicine

## 2024-04-13 ENCOUNTER — Ambulatory Visit: Admitting: Internal Medicine

## 2024-04-13 ENCOUNTER — Other Ambulatory Visit: Payer: Self-pay | Admitting: Physician Assistant

## 2024-04-13 VITALS — BP 115/78 | HR 89 | Ht 70.0 in | Wt 208.2 lb

## 2024-04-13 DIAGNOSIS — K7030 Alcoholic cirrhosis of liver without ascites: Secondary | ICD-10-CM

## 2024-04-13 DIAGNOSIS — Z711 Person with feared health complaint in whom no diagnosis is made: Secondary | ICD-10-CM

## 2024-04-13 DIAGNOSIS — F5104 Psychophysiologic insomnia: Secondary | ICD-10-CM | POA: Diagnosis not present

## 2024-04-13 DIAGNOSIS — I10 Essential (primary) hypertension: Secondary | ICD-10-CM | POA: Diagnosis not present

## 2024-04-13 DIAGNOSIS — J438 Other emphysema: Secondary | ICD-10-CM | POA: Diagnosis not present

## 2024-04-13 DIAGNOSIS — F3342 Major depressive disorder, recurrent, in full remission: Secondary | ICD-10-CM

## 2024-04-13 DIAGNOSIS — E291 Testicular hypofunction: Secondary | ICD-10-CM

## 2024-04-13 DIAGNOSIS — K219 Gastro-esophageal reflux disease without esophagitis: Secondary | ICD-10-CM

## 2024-04-13 DIAGNOSIS — F1010 Alcohol abuse, uncomplicated: Secondary | ICD-10-CM

## 2024-04-13 MED ORDER — TRAZODONE HCL 100 MG PO TABS: 100.0000 mg | ORAL_TABLET | Freq: Every day | ORAL | 1 refills | Status: DC

## 2024-04-13 NOTE — Assessment & Plan Note (Signed)
 His acute concern today is insomnia.  He is currently prescribed trazodone  50 mg nightly, which he believes is intermittently effective.  Recommend increasing trazodone  to 100 mg nightly.

## 2024-04-13 NOTE — Assessment & Plan Note (Signed)
 Mood remains stable with venlafaxine  75 mg daily.

## 2024-04-13 NOTE — Assessment & Plan Note (Signed)
Symptoms remain adequately controlled with pantoprazole 40 mg daily.

## 2024-04-13 NOTE — Assessment & Plan Note (Signed)
 Documented history of alcohol use disorder.  He has maintained sobriety for over 2 years.

## 2024-04-13 NOTE — Assessment & Plan Note (Signed)
 He remains on testosterone  replacement therapy.  PDMP reviewed and is appropriate.  Repeat free/total AM testosterone  levels are pending.

## 2024-04-13 NOTE — Assessment & Plan Note (Signed)
 Asymptomatic currently.  Pulmonary exam unremarkable.  He continues to use albuterol  infrequently as needed.

## 2024-04-13 NOTE — Assessment & Plan Note (Signed)
 Remains adequately controlled with carvedilol .

## 2024-04-13 NOTE — Progress Notes (Signed)
 Established Patient Office Visit  Subjective   Patient ID: Levi Alvarez, male    DOB: April 18, 1971  Age: 53 y.o. MRN: [id number]  Chief Complaint  Patient presents with   Hypertension    Six month follow up   Insomnia    Follow up , trazodone  is not helping    Memory Loss    Patient states he can not remember anything    Levi Alvarez returns to care today for routine follow-up.  He was last evaluated by me on 1/22 as a new patient presenting to establish care.  No medication changes were made at that time, basic labs ordered, and 20-month follow-up arranged.  In the interim, he underwent left knee arthroplasty on 3/17.  ER presentation 3/21 in the setting of worsening left knee pain.  Seen by orthopedic surgery for follow-up 4/1 and 4/29.  He has attended physical therapy regularly.  There have otherwise been no acute interval events. Levi Alvarez reports feeling fairly well today.  His acute concerns today are related to insomnia and his memory.  He is currently prescribed trazodone  50 mg nightly, which he states is intermittently effective.  He would like to discuss additional treatment options.  He is also concerned about his memory.  He states that he has been more forgetful lately.  He would walk into a different room in his house and forget why he entered that room.  He will go to the store and forget what items he came to purchase.  He continues to perform basic ADLs independently and does not have any difficulty with getting lost while driving or with managing household finances.  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   Constipation    Resolved   COPD (chronic obstructive pulmonary disease) (HCC)    DDD (degenerative disc disease), cervical    Diverticulosis    ED (erectile dysfunction)    Esophageal varices (HCC)    GERD (gastroesophageal  reflux disease)    H/O: upper GI bleed    Hepatitis    Hepatitis C antibody positive in blood    resolved   High blood pressure    History of anemia    Internal hemorrhoids    Left inguinal hernia 2020   Lung nodule    Small   Portal hypertensive gastropathy (HCC)    Sleep apnea    trying to get used to his CPAP, not wearing CPAP per SO [name] McAdoo   Umbilical hernia 2020   Past Surgical History:  Procedure Laterality Date   arm surgery     left arm,   COLONOSCOPY WITH PROPOFOL  N/A 01/19/2019   Procedure: COLONOSCOPY WITH PROPOFOL ;  Surgeon: Ace Holder, MD;  Location: [facility];  Service: Gastroenterology;  Laterality: N/A;   ESOPHAGEAL BANDING  11/25/2018   Procedure: ESOPHAGEAL BANDING;  Surgeon: Janel Medford, MD;  Location: [facility];  Service: Endoscopy;;   ESOPHAGEAL BANDING  01/19/2019   Procedure: ESOPHAGEAL BANDING;  Surgeon: Ace Holder, MD;  Location: [facility];  Service: Gastroenterology;;   ESOPHAGEAL BANDING N/A 01/24/2021   Procedure: ESOPHAGEAL BANDING;  Surgeon: Ace Holder, MD;  Location: [facility];  Service: Gastroenterology;  Laterality: N/A;   ESOPHAGOGASTRODUODENOSCOPY (EGD) WITH PROPOFOL  N/A 11/25/2018   Procedure: ESOPHAGOGASTRODUODENOSCOPY (EGD) WITH PROPOFOL ;  Surgeon: Hoyt Macleod  P, MD;  Location: [facility];  Service: Endoscopy;  Laterality: N/A;   ESOPHAGOGASTRODUODENOSCOPY (EGD) WITH PROPOFOL  N/A 01/19/2019   Procedure: ESOPHAGOGASTRODUODENOSCOPY (EGD) WITH PROPOFOL ;  Surgeon: Ace Holder, MD;  Location: [facility];  Service: Gastroenterology;  Laterality: N/A;   ESOPHAGOGASTRODUODENOSCOPY (EGD) WITH PROPOFOL  N/A 04/28/2019   Procedure: ESOPHAGOGASTRODUODENOSCOPY (EGD) WITH PROPOFOL ;  Surgeon: Ace Holder, MD;  Location: [facility];  Service: Gastroenterology;  Laterality: N/A;   ESOPHAGOGASTRODUODENOSCOPY (EGD) WITH PROPOFOL  N/A 11/03/2019   Procedure: ESOPHAGOGASTRODUODENOSCOPY (EGD) WITH  PROPOFOL ;  Surgeon: Albertina Hugger, MD;  Location: [facility];  Service: Gastroenterology;  Laterality: N/A;   ESOPHAGOGASTRODUODENOSCOPY (EGD) WITH PROPOFOL  N/A 01/24/2021   Procedure: ESOPHAGOGASTRODUODENOSCOPY (EGD) WITH PROPOFOL ;  Surgeon: Ace Holder, MD;  Location: [facility];  Service: Gastroenterology;  Laterality: N/A;   KNEE SURGERY Left    POLYPECTOMY  01/19/2019   Procedure: POLYPECTOMY;  Surgeon: Ace Holder, MD;  Location: [facility];  Service: Gastroenterology;;   TOTAL KNEE ARTHROPLASTY Left 01/27/2024   Procedure: LEFT TOTAL KNEE ARTHROPLASTY;  Surgeon: Wes Hamman, MD;  Location: [facility];  Service: Orthopedics;  Laterality: Left;   Social History   Tobacco Use   Smoking status: Every Day    Current packs/day: 0.50    Average packs/day: 0.5 packs/day for 30.0 years (15.0 ttl pk-yrs)    Types: Cigarettes    Passive exposure: Current   Smokeless tobacco: Former    Types: Engineer, drilling   Vaping status: Never Used  Substance Use Topics   Alcohol use: Not Currently    Alcohol/week: 36.0 standard drinks of alcohol    Types: 36 Cans of beer per week    Comment: quit March 2023   Drug use: Yes    Types: Marijuana    Comment: daily use - informed to withhold 24-48 hrs prior to procedure   Family History  Problem Relation Age of Onset   Colitis Mother    Arthritis Mother    Dementia Mother    Cancer Father        lymphoma?   Cancer Paternal Grandfather        bone   Heart disease Neg Hx    Stroke Neg Hx    Diabetes Neg Hx    Colon cancer Neg Hx    Esophageal cancer Neg Hx    Stomach cancer Neg Hx    Rectal cancer Neg Hx    Allergies  Allergen Reactions   Onion Swelling    Raw onions    Gabapentin Other (See Comments)    Restless legs   Naproxen Hives and Swelling   Review of Systems  Constitutional:  Negative for chills and fever.  HENT:  Negative for sore throat.   Respiratory:  Negative for cough and shortness of breath.    Cardiovascular:  Negative for chest pain, palpitations and leg swelling.  Gastrointestinal:  Negative for abdominal pain, blood in stool, constipation, diarrhea, nausea and vomiting.  Genitourinary:  Negative for dysuria and hematuria.  Musculoskeletal:  Negative for myalgias.  Skin:  Negative for itching and rash.  Neurological:  Negative for dizziness and headaches.       Memory concerns  Psychiatric/Behavioral:  Negative for depression and suicidal ideas. The patient has insomnia.      Objective:     BP 115/78   Pulse 89   Ht 5\' 10"  (1.778 m)   Wt 208 lb 3.2 oz (94.4 kg)   SpO2 97%   BMI 29.87  kg/m  BP Readings from Last 3 Encounters:  04/13/24 115/78  02/01/24 (!) 160/90  02/01/24 (!) 147/86   Physical Exam Vitals reviewed.  Constitutional:      General: He is not in acute distress.    Appearance: Normal appearance. He is not ill-appearing.  HENT:     Head: Normocephalic and atraumatic.     Right Ear: External ear normal.     Left Ear: External ear normal.     Nose: Nose normal. No congestion or rhinorrhea.     Mouth/Throat:     Mouth: Mucous membranes are moist.     Pharynx: Oropharynx is clear.  Eyes:     General: No scleral icterus.    Extraocular Movements: Extraocular movements intact.     Conjunctiva/sclera: Conjunctivae normal.     Pupils: Pupils are equal, round, and reactive to light.  Cardiovascular:     Rate and Rhythm: Normal rate and regular rhythm.     Pulses: Normal pulses.     Heart sounds: Normal heart sounds. No murmur heard. Pulmonary:     Effort: Pulmonary effort is normal.     Breath sounds: Normal breath sounds. No wheezing, rhonchi or rales.  Abdominal:     General: Abdomen is flat. Bowel sounds are normal. There is no distension.     Palpations: Abdomen is soft.     Tenderness: There is no abdominal tenderness.     Hernia: A hernia (Reducible umbilical hernia) is present.  Musculoskeletal:        General: No swelling or deformity.  Normal range of motion.     Cervical back: Normal range of motion.  Skin:    General: Skin is warm and dry.     Capillary Refill: Capillary refill takes less than 2 seconds.  Neurological:     General: No focal deficit present.     Mental Status: He is alert and oriented to person, place, and time.     Motor: No weakness.  Psychiatric:        Mood and Affect: Mood normal.        Behavior: Behavior normal.        Thought Content: Thought content normal.   Last CBC Lab Results  Component Value Date   WBC 13.7 (H) 02/01/2024   HGB 10.4 (L) 02/01/2024   HCT 31.8 (L) 02/01/2024   MCV 81.7 02/01/2024   MCH 26.7 02/01/2024   RDW 17.9 (H) 02/01/2024   PLT 152 02/01/2024   Last metabolic panel Lab Results  Component Value Date   GLUCOSE 120 (H) 02/01/2024   NA 132 (L) 02/01/2024   K 4.5 02/01/2024   CL 95 (L) 02/01/2024   CO2 25 02/01/2024   BUN 16 02/01/2024   CREATININE 0.78 02/01/2024   GFRNONAA >60 02/01/2024   CALCIUM  9.6 02/01/2024   PHOS 3.5 01/26/2022   PROT 7.4 02/01/2024   ALBUMIN 3.7 02/01/2024   LABGLOB 2.8 01/05/2020   AGRATIO 1.4 01/05/2020   BILITOT 1.0 02/01/2024   ALKPHOS 72 02/01/2024   AST 26 02/01/2024   ALT 27 02/01/2024   ANIONGAP 12 02/01/2024   Last lipids Lab Results  Component Value Date   CHOL 132 01/05/2020   HDL 51 01/05/2020   LDLCALC 64 01/05/2020   TRIG 91 01/05/2020   CHOLHDL 2.6 01/05/2020   Last hemoglobin A1c Lab Results  Component Value Date   HGBA1C 5.2 06/05/2017   Last thyroid functions Lab Results  Component Value Date   TSH 4.207  04/10/2021   Last vitamin B12 and Folate Lab Results  Component Value Date   VITAMINB12 847 01/06/2019   FOLATE >24.0 01/06/2019     Assessment & Plan:   Problem List Items Addressed This Visit       Essential hypertension   Remains adequately controlled with carvedilol .      Chronic obstructive pulmonary disease (HCC)   Asymptomatic currently.  Pulmonary exam unremarkable.   He continues to use albuterol  infrequently as needed.      Cirrhosis with alcoholism (HCC)   Appears well compensated on exam.  Due for GI follow-up.      GERD (gastroesophageal reflux disease)   Symptoms remain adequately controlled with pantoprazole  40 mg daily.      Hypogonadism in male   He remains on testosterone  replacement therapy.  PDMP reviewed and is appropriate.  Repeat free/total AM testosterone  levels are pending.      Alcohol abuse (Chronic)   Documented history of alcohol use disorder.  He has maintained sobriety for over 2 years.      MDD (major depressive disorder)   Mood remains stable with venlafaxine  75 mg daily.      Insomnia - Primary   His acute concern today is insomnia.  He is currently prescribed trazodone  50 mg nightly, which he believes is intermittently effective.  Recommend increasing trazodone  to 100 mg nightly.      Concern about memory   His additional concern today is related to his memory as described above.  He is becoming more forgetful at times.  He endorses a history significant for head trauma and alcohol abuse.  Will update basic labs today to rule out any underlying metabolic etiologies that may be contributing to his symptoms.  Will also arrange follow-up in 6 weeks for MoCA.      Return in about 6 weeks (around 05/25/2024) for MoCA.   Tobi Fortes, MD

## 2024-04-13 NOTE — Assessment & Plan Note (Signed)
 His additional concern today is related to his memory as described above.  He is becoming more forgetful at times.  He endorses a history significant for head trauma and alcohol abuse.  Will update basic labs today to rule out any underlying metabolic etiologies that may be contributing to his symptoms.  Will also arrange follow-up in 6 weeks for MoCA.

## 2024-04-13 NOTE — Assessment & Plan Note (Signed)
 Appears well compensated on exam.  Due for GI follow-up.

## 2024-04-13 NOTE — Patient Instructions (Signed)
 It was a pleasure to see you today.  Thank you for giving us  the opportunity to be involved in your care.  Below is a brief recap of your visit and next steps.  We will plan to see you again in 6 weeks.  Summary Increase trazodone  100 mg nightly Complete previously ordered labs Follow up in 6 weeks for MoCA testing

## 2024-04-14 ENCOUNTER — Encounter: Payer: Self-pay | Admitting: Physician Assistant

## 2024-04-14 ENCOUNTER — Ambulatory Visit (INDEPENDENT_AMBULATORY_CARE_PROVIDER_SITE_OTHER): Admitting: Physician Assistant

## 2024-04-14 DIAGNOSIS — M5020 Other cervical disc displacement, unspecified cervical region: Secondary | ICD-10-CM | POA: Insufficient documentation

## 2024-04-14 DIAGNOSIS — M5412 Radiculopathy, cervical region: Secondary | ICD-10-CM | POA: Insufficient documentation

## 2024-04-14 DIAGNOSIS — M7918 Myalgia, other site: Secondary | ICD-10-CM | POA: Insufficient documentation

## 2024-04-14 DIAGNOSIS — M542 Cervicalgia: Secondary | ICD-10-CM | POA: Insufficient documentation

## 2024-04-14 DIAGNOSIS — M75101 Unspecified rotator cuff tear or rupture of right shoulder, not specified as traumatic: Secondary | ICD-10-CM | POA: Insufficient documentation

## 2024-04-14 DIAGNOSIS — Z96652 Presence of left artificial knee joint: Secondary | ICD-10-CM

## 2024-04-14 DIAGNOSIS — M5126 Other intervertebral disc displacement, lumbar region: Secondary | ICD-10-CM | POA: Insufficient documentation

## 2024-04-14 NOTE — Progress Notes (Signed)
 Post-Op Visit Note   Patient: Levi Alvarez           Date of Birth: [birth year]           MRN: [id number] Visit Date: 04/14/2024 PCP: Alison Irvine, FNP   Assessment & Plan:  Chief Complaint: No chief complaint on file.  Visit Diagnoses:  1. Status post total left knee replacement     Plan: Patient is a pleasant 53 year old gentleman who comes in today 3 months status post left total knee replacement 01/27/2024.  He has been doing well until this past weekend when he noticed swelling to the proximal aspect of the wound.  He feels like this is very tender.  He denies any fevers or chills or any other systemic symptoms.  He has finished physical therapy but continues to work on a home exercise program.  Examination of the left knee reveals a fully healed surgical scar without complication.  He does have a small effusion.  Range of motion 0 to 115 degrees.  Stable valgus varus stress.  He does have what looks like a baseball sized area of scar tissue which is mildly tender.  No skin changes.  No signs of cellulitis or infection.  At this point, we have discussed massage over the scar tissue.  He will continue on range of motion exercises.  Follow-up in 3 months for recheck and repeat x-rays of the left knee..  Call with concerns or questions.  Follow-Up Instructions: Return in about 3 months (around 07/15/2024).   Orders:  No orders of the defined types were placed in this encounter.  No orders of the defined types were placed in this encounter.   Imaging: No new imaging  PMFS History: Patient Active Problem List   Diagnosis Date Noted   Concern about memory 04/13/2024   Status post total left knee replacement 01/27/2024   GERD (gastroesophageal reflux disease) 12/04/2023   History of hepatitis C virus infection 12/04/2023   Erectile dysfunction 12/04/2023   Hypogonadism in male 12/04/2023   MDD (major depressive disorder) 12/04/2023   Insomnia 12/04/2023   Slow urinary stream  12/04/2023   Hyponatremia 01/23/2022   Alcohol withdrawal (HCC) 01/22/2022   Class 1 obesity 01/22/2022   Alcohol withdrawal syndrome without complication (HCC)    Alcohol dependence with withdrawal with complication (HCC) 04/20/2021   Marijuana abuse, continuous 04/20/2021   Pancreatitis, acute 04/10/2021   Hypotension 04/10/2021   Chest pain 04/10/2021   Esophageal varices without bleeding (HCC)    Abnormal CT scan, colon    Benign neoplasm of colon    Bleeding esophageal varices (HCC)    Spondylosis without myelopathy or radiculopathy, cervical region 12/03/2018   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   Post-traumatic osteoarthritis of left knee 12/03/2018   Chronic obstructive pulmonary disease (HCC) 12/02/2018   Upper GI bleed 11/24/2018   Acute blood loss anemia 11/24/2018   Alcohol abuse 11/24/2018   Chronic back pain 11/24/2018   Chronic hepatitis C without hepatic coma (HCC) 10/08/2017   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   Chronic pain of left knee 07/23/2017   Essential hypertension 07/12/2017   OSA (obstructive sleep apnea) 07/12/2017   Class 2 obesity due to excess calories with body mass index (BMI) of 35.0 to 35.9 in adult 07/12/2017   Elevated liver enzymes 07/12/2017   Other chronic pain 07/12/2017   Smoker 07/12/2017   Hyperlipidemia 07/12/2017   Polyarthralgia 07/12/2017   Snoring 06/05/2017   Past Medical History:  Diagnosis Date  Alcoholism (HCC)    Blood transfusion without reported diagnosis    jan, 2020 3 units PRBC   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   Constipation    Resolved   COPD (chronic obstructive pulmonary disease) (HCC)    DDD (degenerative disc disease), cervical    Diverticulosis    ED (erectile dysfunction)    Esophageal varices (HCC)    GERD (gastroesophageal reflux disease)    H/O: upper GI bleed    Hepatitis    Hepatitis C antibody positive in blood    resolved   High blood pressure    History of anemia     Internal hemorrhoids    Left inguinal hernia 2020   Lung nodule    Small   Portal hypertensive gastropathy (HCC)    Sleep apnea    trying to get used to his CPAP, not wearing CPAP per SO [name] McAdoo   Umbilical hernia 2020    Family History  Problem Relation Age of Onset   Colitis Mother    Arthritis Mother    Dementia Mother    Cancer Father        lymphoma?   Cancer Paternal Grandfather        bone   Heart disease Neg Hx    Stroke Neg Hx    Diabetes Neg Hx    Colon cancer Neg Hx    Esophageal cancer Neg Hx    Stomach cancer Neg Hx    Rectal cancer Neg Hx     Past Surgical History:  Procedure Laterality Date   arm surgery     left arm,   COLONOSCOPY WITH PROPOFOL  N/A 01/19/2019   Procedure: COLONOSCOPY WITH PROPOFOL ;  Surgeon: Ace Holder, MD;  Location: [facility];  Service: Gastroenterology;  Laterality: N/A;   ESOPHAGEAL BANDING  11/25/2018   Procedure: ESOPHAGEAL BANDING;  Surgeon: Janel Medford, MD;  Location: [facility];  Service: Endoscopy;;   ESOPHAGEAL BANDING  01/19/2019   Procedure: ESOPHAGEAL BANDING;  Surgeon: Ace Holder, MD;  Location: [facility];  Service: Gastroenterology;;   ESOPHAGEAL BANDING N/A 01/24/2021   Procedure: ESOPHAGEAL BANDING;  Surgeon: Ace Holder, MD;  Location: [facility];  Service: Gastroenterology;  Laterality: N/A;   ESOPHAGOGASTRODUODENOSCOPY (EGD) WITH PROPOFOL  N/A 11/25/2018   Procedure: ESOPHAGOGASTRODUODENOSCOPY (EGD) WITH PROPOFOL ;  Surgeon: Janel Medford, MD;  Location: [facility];  Service: Endoscopy;  Laterality: N/A;   ESOPHAGOGASTRODUODENOSCOPY (EGD) WITH PROPOFOL  N/A 01/19/2019   Procedure: ESOPHAGOGASTRODUODENOSCOPY (EGD) WITH PROPOFOL ;  Surgeon: Ace Holder, MD;  Location: [facility];  Service: Gastroenterology;  Laterality: N/A;   ESOPHAGOGASTRODUODENOSCOPY (EGD) WITH PROPOFOL  N/A 04/28/2019   Procedure: ESOPHAGOGASTRODUODENOSCOPY (EGD) WITH PROPOFOL ;  Surgeon: Ace Holder, MD;  Location: [facility];  Service: Gastroenterology;  Laterality: N/A;   ESOPHAGOGASTRODUODENOSCOPY (EGD) WITH PROPOFOL  N/A 11/03/2019   Procedure: ESOPHAGOGASTRODUODENOSCOPY (EGD) WITH PROPOFOL ;  Surgeon: Albertina Hugger, MD;  Location: [facility];  Service: Gastroenterology;  Laterality: N/A;   ESOPHAGOGASTRODUODENOSCOPY (EGD) WITH PROPOFOL  N/A 01/24/2021   Procedure: ESOPHAGOGASTRODUODENOSCOPY (EGD) WITH PROPOFOL ;  Surgeon: Ace Holder, MD;  Location: [facility];  Service: Gastroenterology;  Laterality: N/A;   KNEE SURGERY Left    POLYPECTOMY  01/19/2019   Procedure: POLYPECTOMY;  Surgeon: Ace Holder, MD;  Location: [facility];  Service: Gastroenterology;;   TOTAL KNEE ARTHROPLASTY Left 01/27/2024   Procedure: LEFT TOTAL KNEE ARTHROPLASTY;  Surgeon: Wes Hamman, MD;  Location: [facility];  Service: Orthopedics;  Laterality: Left;   Social History   Occupational History   Occupation: disabled  Tobacco Use   Smoking status: Every Day    Current packs/day: 0.50    Average packs/day: 0.5 packs/day for 30.0 years (15.0 ttl pk-yrs)    Types: Cigarettes    Passive exposure: Current   Smokeless tobacco: Former    Types: Engineer, drilling   Vaping status: Never Used  Substance and Sexual Activity   Alcohol use: Not Currently    Alcohol/week: 36.0 standard drinks of alcohol    Types: 36 Cans of beer per week    Comment: quit March 2023   Drug use: Yes    Types: Marijuana    Comment: daily use - informed to withhold 24-48 hrs prior to procedure   Sexual activity: Yes    Partners: Female

## 2024-04-15 ENCOUNTER — Other Ambulatory Visit: Payer: Self-pay | Admitting: Internal Medicine

## 2024-04-15 DIAGNOSIS — K219 Gastro-esophageal reflux disease without esophagitis: Secondary | ICD-10-CM

## 2024-04-19 ENCOUNTER — Ambulatory Visit (INDEPENDENT_AMBULATORY_CARE_PROVIDER_SITE_OTHER)

## 2024-04-19 ENCOUNTER — Ambulatory Visit
Admission: RE | Admit: 2024-04-19 | Discharge: 2024-04-19 | Disposition: A | Discharge status: Home / Self Care | Source: Ambulatory Visit | Attending: Nurse Practitioner | Admitting: Nurse Practitioner

## 2024-04-19 VITALS — BP 136/90 | HR 72 | Temp 98.4°F | Resp 18

## 2024-04-19 DIAGNOSIS — M25552 Pain in left hip: Secondary | ICD-10-CM

## 2024-04-19 MED ORDER — TIZANIDINE HCL 4 MG PO TABS: 4.0000 mg | ORAL_TABLET | Freq: Three times a day (TID) | ORAL | 0 refills | Status: DC | PRN
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
## 2024-04-19 NOTE — ED Triage Notes (Signed)
 Pt reports he believes his left hip is out of socket x 3 days. States his foot went into some gravel then later that night he noticed his left hip felt different.

## 2024-04-19 NOTE — Discharge Instructions (Signed)
 The x-ray was negative for fracture or dislocation. Take medication as prescribed. You may take over-the-counter Tylenol  as needed for pain or discomfort. Recommend the use of ice or heat.  Apply ice for pain or swelling, heat for spasm or stiffness.  Apply for 20 minutes, remove for 1 hour, repeat as needed. Gentle exercises for the left hip to help with pain or discomfort.  I have provided exercises for you to try at home. If symptoms fail to improve, recommend follow-up with orthopedics for further evaluation. Follow-up as needed.

## 2024-04-20 ENCOUNTER — Encounter: Payer: Self-pay | Admitting: Internal Medicine

## 2024-04-20 ENCOUNTER — Ambulatory Visit: Payer: Self-pay | Admitting: *Deleted

## 2024-04-20 NOTE — Telephone Encounter (Signed)
      FYI Only or Action Required?: FYI only for provider  Patient was last seen in primary care on 04/13/2024 by Tobi Fortes, MD. Called Nurse Triage reporting Groin Pain. Symptoms began several days ago. Interventions attempted: Nothing. Symptoms are: unchanged.  Triage Disposition: Go to ED Now (Notify PCP)  Patient/caregiver understands and will follow disposition?: no                     Copied from CRM [id number]. Topic: Clinical - Red Word Triage >> Apr 20, 2024  9:41 AM Levi Alvarez wrote: Red Word that prompted transfer to Nurse Triage: Patient is in a ton of pain, was seen in urgent care yesterday, was told it was an inguinal hernia. Cannot move, barely walk or get up out of bed today. Calling now to get scheduled her mychart message with dr Kermit Ped. Reason for Disposition  Hernia is painful or tender to touch  Answer Assessment - Initial Assessment Questions 1. ONSET:  "When did this first appear?"     Wednesday per [place] on DPR  2. APPEARANCE: "What does it look like?"     Knot in left  groin  3. SIZE: "How big is it?" (inches, cm or compare to coins, fruit)     Moveable pea  size left groin area 4. LOCATION: "Where exactly is the hernia located?"     Left groin area  5. PATTERN: "Does the swelling come and go, or has it been constant since it started?"    Denies swelling  6. PAIN: "Is there any pain?" If Yes, ask: "How bad is it?"  (Scale 1-10; or mild, moderate, severe)    - MILD (1-3): Doesn't interfere with normal activities, abdomen soft and not tender to touch.     - MODERATE (4-7): Interferes with normal activities or awakens from sleep, abdomen tender to touch.     - SEVERE (8-10): Excruciating pain, doubled over, unable to do any normal activities.       Severe tender to touch 7. DIAGNOSIS: "Have you been seen by a doctor (or NP/PA) for this?" "Did the doctor diagnose you as having a hernia?"     Yes went to UC yesterday  8. OTHER SYMPTOMS: "Do you  have any other symptoms?" (e.g., fever, abdomen pain, vomiting)     Left groin pain tender to touch, does not want to get out of bed and can barely walk due to pain 9. PREGNANCY: "Is there any chance you are pregnant?" "When was your last menstrual period?"     Na  Recommended ED due to severe pain and tender to touch. Patient Levi Alvarez on DPR reports someone from office called her to schedule appt already . She reports patient will not want to go to ED. CAL notified. See previous notes from Dr. Kermit Ped today.  Protocols used: [facility]

## 2024-04-20 NOTE — Telephone Encounter (Signed)
 Appt scheduled

## 2024-04-21 ENCOUNTER — Encounter

## 2024-04-21 ENCOUNTER — Ambulatory Visit

## 2024-04-21 VITALS — BP 128/80 | HR 60 | Ht 70.0 in | Wt 197.0 lb

## 2024-04-21 DIAGNOSIS — R1032 Left lower quadrant pain: Secondary | ICD-10-CM

## 2024-04-21 NOTE — Progress Notes (Unsigned)
   Acute Office Visit  Subjective:     Patient ID: Levi Alvarez, male    DOB: Mar 27, 1971, 53 y.o.   MRN: [id number]  Chief Complaint  Patient presents with   Medical Management of Chronic Issues    Pt here thinks he has a hernia     HPI Pain  He reports new onset left groin pain. was an injury that may have caused the pain. The pain started about a week ago and is staying constant. The pain does not radiate . The pain is described as burning and sharp, is 7/10 in intensity, occurring constantly. Symptoms are worse in the: evening, nighttime  Aggravating factors: none Relieving factors: none.  He has tried application of ice and acetaminophen  with no relief.   Pt was seen at urgent care on 04/19/2024 for left hip pain.  X-ray at that time was negative for any bony abnormality of the hip. --------------------------------------------------------------------------------------------------- Patient is in today for possible hernia.  He was seen at [facility] on 04/19/24 for left hip pain.  X-ray of hip was negative for any acute fracture/dislocation or any degenerative changes.    ROS     Objective:    BP 128/80   Pulse 60   Ht 5' 10 (1.778 m)   Wt 197 lb 0.6 oz (89.4 kg)   SpO2 98%   BMI 28.27 kg/m    Physical Exam Vitals and nursing note reviewed.  Constitutional:      Appearance: Normal appearance.  Eyes:     Extraocular Movements: Extraocular movements intact.     Pupils: Pupils are equal, round, and reactive to light.  Cardiovascular:     Rate and Rhythm: Normal rate and regular rhythm.  Pulmonary:     Effort: Pulmonary effort is normal.     Breath sounds: Normal breath sounds.  Abdominal:     Hernia: There is no hernia in the left inguinal area or right inguinal area.  Musculoskeletal:     Right hip: Tenderness present.       Legs:  Neurological:     Mental Status: He is alert and oriented to person, place, and time.  Psychiatric:        Mood and Affect: Mood normal.         Thought Content: Thought content normal.     No results found for any visits on 04/21/24.      Assessment & Plan:   Problem List Items Addressed This Visit   None Visit Diagnoses       Groin pain, left    -  Primary   will obtain CT scan of left hip/groin for further evaluation of ongoing hip/groin pain.  no evidence of hernia appreciated today.   Relevant Orders   MR PELVIS WO CONTRAST       No orders of the defined types were placed in this encounter.   No follow-ups on file.  Alison Irvine, FNP

## 2024-04-22 ENCOUNTER — Telehealth: Payer: Self-pay

## 2024-04-22 NOTE — Telephone Encounter (Signed)
 Copied from CRM [id number]. Topic: Clinical - Medication Prior Auth >> Apr 22, 2024  4:21 PM Hamp Levine R wrote: Reason for CRM: Lynnie Saucier from [facility] pre service center calling stating patient needs Prior authorization for his MRI tomorrow  Lynnie Saucier can be reached at [phone]

## 2024-04-23 ENCOUNTER — Ambulatory Visit (HOSPITAL_COMMUNITY)

## 2024-04-23 NOTE — Telephone Encounter (Signed)
 Authorization information has been obtained and placed in Referral Notes.

## 2024-04-26 ENCOUNTER — Encounter: Payer: Self-pay | Admitting: Internal Medicine

## 2024-04-27 ENCOUNTER — Encounter (HOSPITAL_COMMUNITY)

## 2024-04-27 ENCOUNTER — Other Ambulatory Visit: Payer: Self-pay

## 2024-04-27 ENCOUNTER — Encounter (HOSPITAL_COMMUNITY): Payer: Self-pay

## 2024-04-27 DIAGNOSIS — K219 Gastro-esophageal reflux disease without esophagitis: Secondary | ICD-10-CM

## 2024-04-27 DIAGNOSIS — R350 Frequency of micturition: Secondary | ICD-10-CM

## 2024-04-27 MED ORDER — PANTOPRAZOLE SODIUM 40 MG PO TBEC
40.0000 mg | DELAYED_RELEASE_TABLET | Freq: Every day | ORAL | 0 refills | Status: DC
Start: 2024-04-27 — End: 2024-08-14

## 2024-04-27 MED ORDER — TAMSULOSIN HCL 0.4 MG PO CAPS: 0.4000 mg | ORAL_CAPSULE | Freq: Every day | ORAL | 0 refills | Status: DC

## 2024-04-27 NOTE — Telephone Encounter (Signed)
 Refills sent

## 2024-04-27 NOTE — Therapy (Signed)
 [facility] at [facility] [address] [place], [place], [address] Phone: [phone]   Fax:  [phone]  Patient Details  Name: Levi Alvarez MRN: [id number] Date of Birth: 1970-11-19 Referring Provider:  No ref. provider found  Encounter Date: 04/27/2024  Pt called regarding no show #2. Left voicemail regarding to call office back to continue with Physical Therapy. Pt informed/reminded of no show policy.   Gatha Kaska, PT 04/27/2024, 12:55 PM  [facility] at [facility] [address] [place], [place], [address] Phone: [phone]   Fax:  [phone]

## 2024-05-03 ENCOUNTER — Other Ambulatory Visit: Payer: Self-pay | Admitting: Physician Assistant

## 2024-05-04 ENCOUNTER — Ambulatory Visit: Admitting: Urology

## 2024-05-04 ENCOUNTER — Encounter: Payer: Self-pay | Admitting: Urology

## 2024-05-04 VITALS — BP 119/72 | HR 57

## 2024-05-04 DIAGNOSIS — N401 Enlarged prostate with lower urinary tract symptoms: Secondary | ICD-10-CM

## 2024-05-04 DIAGNOSIS — N138 Other obstructive and reflux uropathy: Secondary | ICD-10-CM

## 2024-05-04 DIAGNOSIS — R351 Nocturia: Secondary | ICD-10-CM | POA: Diagnosis not present

## 2024-05-04 LAB — B12 AND FOLATE PANEL
Folate: >20 ng/mL (ref >3.0)
Vitamin B-12: 739 pg/mL (ref 232–1245)

## 2024-05-04 LAB — CBC WITH DIFFERENTIAL/PLATELET
Basophils Absolute: 0.1 10*3/uL (ref 0.0–0.2)
Basos: 1 %
EOS (ABSOLUTE): 0.3 10*3/uL (ref 0.0–0.4)
Eos: 3 %
Hematocrit: 41.5 % (ref 37.5–51.0)
Hemoglobin: 12.5 g/dL — ABNORMAL LOW (ref 13.0–17.7)
Immature Grans (Abs): 0 10*3/uL (ref 0.0–0.1)
Immature Granulocytes: 0 %
Lymphocytes Absolute: 2.4 10*3/uL (ref 0.7–3.1)
Lymphs: 26 %
MCH: 25 pg — ABNORMAL LOW (ref 26.6–33.0)
MCHC: 30.1 g/dL — ABNORMAL LOW (ref 31.5–35.7)
MCV: 83 fL (ref 79–97)
Monocytes Absolute: 0.7 10*3/uL (ref 0.1–0.9)
Monocytes: 8 %
Neutrophils Absolute: 5.9 10*3/uL (ref 1.4–7.0)
Neutrophils: 62 %
Platelets: 210 10*3/uL (ref 150–450)
RBC: 5 x10E6/uL (ref 4.14–5.80)
RDW: 15.6 % — ABNORMAL HIGH (ref 11.6–15.4)
WBC: 9.4 10*3/uL (ref 3.4–10.8)

## 2024-05-04 LAB — CMP14+EGFR
ALT: 26 IU/L (ref 0–44)
AST: 24 IU/L (ref 0–40)
Albumin: 4.2 g/dL (ref 3.8–4.9)
Alkaline Phosphatase: 167 IU/L — ABNORMAL HIGH (ref 44–121)
BUN/Creatinine Ratio: 14 (ref 9–20)
BUN: 13 mg/dL (ref 6–24)
Bilirubin Total: <0.2 mg/dL (ref 0.0–1.2)
CO2: 23 mmol/L (ref 20–29)
Calcium: 9.5 mg/dL (ref 8.7–10.2)
Chloride: 101 mmol/L (ref 96–106)
Creatinine, Ser: 0.9 mg/dL (ref 0.76–1.27)
Globulin, Total: 3 g/dL (ref 1.5–4.5)
Glucose: 123 mg/dL — ABNORMAL HIGH (ref 70–99)
Potassium: 4.3 mmol/L (ref 3.5–5.2)
Sodium: 140 mmol/L (ref 134–144)
Total Protein: 7.2 g/dL (ref 6.0–8.5)
eGFR: 103 mL/min/{1.73_m2} (ref >59)

## 2024-05-04 LAB — BLADDER SCAN AMB NON-IMAGING: Scan Result: 59

## 2024-05-04 LAB — LIPID PANEL
Chol/HDL Ratio: 5.2 ratio — ABNORMAL HIGH (ref 0.0–5.0)
Cholesterol, Total: 192 mg/dL (ref 100–199)
HDL: 37 mg/dL — ABNORMAL LOW (ref >39)
LDL Chol Calc (NIH): 134 mg/dL — ABNORMAL HIGH (ref 0–99)
Triglycerides: 114 mg/dL (ref 0–149)
VLDL Cholesterol Cal: 21 mg/dL (ref 5–40)

## 2024-05-04 LAB — TESTOSTERONE,FREE AND TOTAL
Testosterone, Free: 8 pg/mL (ref 7.2–24.0)
Testosterone: 339 ng/dL (ref 264–916)

## 2024-05-04 LAB — TSH+FREE T4
Free T4: 0.81 ng/dL — ABNORMAL LOW (ref 0.82–1.77)
TSH: 6.02 u[IU]/mL — ABNORMAL HIGH (ref 0.450–4.500)

## 2024-05-04 LAB — HEMOGLOBIN A1C
Est. average glucose Bld gHb Est-mCnc: 123 mg/dL
Hgb A1c MFr Bld: 5.9 % — ABNORMAL HIGH (ref 4.8–5.6)

## 2024-05-04 LAB — VITAMIN D 25 HYDROXY (VIT D DEFICIENCY, FRACTURES): Vit D, 25-Hydroxy: 47.8 ng/mL (ref 30.0–100.0)

## 2024-05-04 MED ORDER — ALFUZOSIN HCL ER 10 MG PO TB24: 10.0000 mg | ORAL_TABLET | Freq: Every day | ORAL | 11 refills | Status: DC

## 2024-05-04 NOTE — Patient Instructions (Signed)

## 2024-05-04 NOTE — Progress Notes (Signed)
 05/04/2024 3:20 PM   JENNIFER HOLLAND 30-Sep-1971 [id number]  Referring provider: Melvenia Manus BRAVO, MD [address] [place],  [place] [address]  nocturia   HPI: Mr More is a 53yo here for evaluation of nocturia and weak urinary stream. He is currently on flomax  for the past 3-4 years. IPSS 145 QOL 4 on flomax  0.4mg  daily. Urine stream is fair. No straining to urinate. Nocturia 4-6x. PVR 59cc. He has on 100mg  every week of IM testosterone . He has a history of OSA and used to wear a CPAP. He stopped his CPAP 2 years. 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   Constipation    Resolved   COPD (chronic obstructive pulmonary disease) (HCC)    DDD (degenerative disc disease), cervical    Diverticulosis    ED (erectile dysfunction)    Esophageal varices (HCC)    GERD (gastroesophageal reflux disease)    H/O: upper GI bleed    Hepatitis    Hepatitis C antibody positive in blood    resolved   High blood pressure    History of anemia    Internal hemorrhoids    Left inguinal hernia 2020   Lung nodule    Small   Portal hypertensive gastropathy (HCC)    Sleep apnea    trying to get used to his CPAP, not wearing CPAP per SO [name] McAdoo   Umbilical hernia 2020    Surgical History: Past Surgical History:  Procedure Laterality Date   arm surgery     left arm,   COLONOSCOPY WITH PROPOFOL  N/A 01/19/2019   Procedure: COLONOSCOPY WITH PROPOFOL ;  Surgeon: Leigh Elspeth SQUIBB, MD;  Location: [facility];  Service: Gastroenterology;  Laterality: N/A;   ESOPHAGEAL BANDING  11/25/2018   Procedure: ESOPHAGEAL BANDING;  Surgeon: Teressa Toribio SQUIBB, MD;  [facility];  Service: Endoscopy;;   ESOPHAGEAL BANDING  01/19/2019   Procedure: ESOPHAGEAL BANDING;  Surgeon: Leigh Elspeth SQUIBB,  MD;  Location: [facility];  Service: Gastroenterology;;   ESOPHAGEAL BANDING N/A 01/24/2021   Procedure: ESOPHAGEAL BANDING;  Surgeon: Leigh Elspeth SQUIBB, MD;  [facility];  Service: Gastroenterology;  Laterality: N/A;   ESOPHAGOGASTRODUODENOSCOPY (EGD) WITH PROPOFOL  N/A 11/25/2018   Procedure: ESOPHAGOGASTRODUODENOSCOPY (EGD) WITH PROPOFOL ;  Surgeon: Teressa Toribio SQUIBB, MD;  [facility];  Service: Endoscopy;  Laterality: N/A;   ESOPHAGOGASTRODUODENOSCOPY (EGD) WITH PROPOFOL  N/A 01/19/2019   Procedure: ESOPHAGOGASTRODUODENOSCOPY (EGD) WITH PROPOFOL ;  Surgeon: Leigh Elspeth SQUIBB, MD;  [facility];  Service: Gastroenterology;  Laterality: N/A;   ESOPHAGOGASTRODUODENOSCOPY (EGD) WITH PROPOFOL  N/A 04/28/2019   Procedure: ESOPHAGOGASTRODUODENOSCOPY (EGD) WITH PROPOFOL ;  Surgeon: Leigh Elspeth SQUIBB, MD;  [facility];  Service: Gastroenterology;  Laterality: N/A;   ESOPHAGOGASTRODUODENOSCOPY (EGD) WITH PROPOFOL  N/A 11/03/2019   Procedure: ESOPHAGOGASTRODUODENOSCOPY (EGD) WITH PROPOFOL ;  Surgeon: Legrand Victory LITTIE DOUGLAS, MD;  [facility];  Service: Gastroenterology;  Laterality: N/A;   ESOPHAGOGASTRODUODENOSCOPY (EGD) WITH PROPOFOL  N/A 01/24/2021   Procedure: ESOPHAGOGASTRODUODENOSCOPY (EGD) WITH PROPOFOL ;  Surgeon: Leigh Elspeth SQUIBB, MD;  [facility];  Service: Gastroenterology;  Laterality: N/A;   KNEE SURGERY Left    POLYPECTOMY  01/19/2019   Procedure: POLYPECTOMY;  Surgeon: Leigh Elspeth SQUIBB, MD;  [facility];  Service:  Gastroenterology;;   TOTAL KNEE ARTHROPLASTY Left 01/27/2024   Procedure: LEFT TOTAL KNEE ARTHROPLASTY;  Surgeon: Jerri Kay HERO, MD;  [facility];  Service: Orthopedics;  Laterality: Left;    Home Medications:  Allergies as of 05/04/2024       Reactions   Onion Swelling   Raw onions    Gabapentin Other (See Comments)   Restless legs   Naproxen Hives, Swelling        Medication List         Accurate as of May 04, 2024  3:20 PM. If you have any questions, ask your nurse or doctor.          carvedilol  6.25 MG tablet Commonly known as: COREG  Take 1 tablet (6.25 mg total) by mouth 2 (two) times daily with a meal.   diclofenac  Sodium 1 % Gel Commonly known as: Voltaren  Apply 4 g topically 4 (four) times daily.   docusate sodium  100 MG capsule Commonly known as: Colace Take 1 capsule (100 mg total) by mouth daily as needed.   folic acid  1 MG tablet Commonly known as: FOLVITE  Take 1 mg by mouth in the morning.   lidocaine  4 % Commonly known as: Salonpas Pain Relieving Place 1 patch onto the skin every 12 (twelve) hours.   loratadine  10 MG tablet Commonly known as: CLARITIN  Take 10 mg by mouth daily.   Magnesium  250 MG Caps Take 250 mg by mouth daily.   methocarbamol  750 MG tablet Commonly known as: ROBAXIN  TAKE 1 TABLET(750 MG) BY MOUTH THREE TIMES DAILY AS NEEDED FOR MUSCLE SPASMS   MILK THISTLE PO Take 1 capsule by mouth 2 (two) times daily.   multivitamin with minerals Tabs tablet Take 1 tablet by mouth daily with breakfast.   pantoprazole  40 MG tablet Commonly known as: PROTONIX  Take 1 tablet (40 mg total) by mouth daily.   sildenafil  100 MG tablet Commonly known as: VIAGRA  1/2-1 tab po prn   tamsulosin  0.4 MG Caps capsule Commonly known as: FLOMAX  Take 1 capsule (0.4 mg total) by mouth daily.   testosterone  cypionate 200 MG/ML injection Commonly known as: DEPOTESTOSTERONE CYPIONATE Inject 0.5 mLs (100 mg total) into the skin once a week.   thiamine  100 MG tablet Commonly known as: VITAMIN B1 Take 1 tablet (100 mg total) by mouth in the morning.   tiZANidine  4 MG tablet Commonly known as: Zanaflex  Take 1 tablet (4 mg total) by mouth every 8 (eight) hours as needed for muscle spasms.   traZODone  100 MG tablet Commonly known as: DESYREL  Take 1 tablet (100 mg total) by mouth at bedtime.   venlafaxine  XR 75 MG 24 hr capsule Commonly  known as: EFFEXOR -XR TAKE 1 CAPSULE BY MOUTH DAILY.   Ventolin  HFA 108 (90 Base) MCG/ACT inhaler Generic drug: albuterol  INHALE 1 PUFF EVERY 6 (SIX) HOURS AS NEEDED INTO THE LUNGS FOR WHEEZING OR SHORTNESS OF BREATH.        Allergies:  Allergies  Allergen Reactions   Onion Swelling    Raw onions    Gabapentin Other (See Comments)    Restless legs   Naproxen Hives and Swelling    Family History: Family History  Problem Relation Age of Onset   Colitis Mother    Arthritis Mother    Dementia Mother    Cancer Father        lymphoma?   Cancer Paternal Grandfather        bone   Heart disease Neg Hx  Stroke Neg Hx    Diabetes Neg Hx    Colon cancer Neg Hx    Esophageal cancer Neg Hx    Stomach cancer Neg Hx    Rectal cancer Neg Hx     Social History:  reports that he has been smoking cigarettes. He has a 15 pack-year smoking history. He has been exposed to tobacco smoke. He has quit using smokeless tobacco.  His smokeless tobacco use included chew. He reports that he does not currently use alcohol after a past usage of about 36.0 standard drinks of alcohol per week. He reports current drug use. Drug: Marijuana.  ROS: All other review of systems were reviewed and are negative except what is noted above in HPI  Physical Exam: BP 119/72   Pulse (!) 57   Constitutional:  Alert and oriented, No acute distress. HEENT: [place] AT, moist mucus membranes.  Trachea midline, no masses. Cardiovascular: No clubbing, cyanosis, or edema. Respiratory: Normal respiratory effort, no increased work of breathing. GI: Abdomen is soft, nontender, nondistended, no abdominal masses GU: No CVA tenderness.  Lymph: No cervical or inguinal lymphadenopathy. Skin: No rashes, bruises or suspicious lesions. Neurologic: Grossly intact, no focal deficits, moving all 4 extremities. Psychiatric: Normal mood and affect.  Laboratory Data: Lab Results  Component Value Date   WBC 9.4 05/01/2024   HGB  12.5 (L) 05/01/2024   HCT 41.5 05/01/2024   MCV 83 05/01/2024   PLT 210 05/01/2024    Lab Results  Component Value Date   CREATININE 0.90 05/01/2024    No results found for: PSA  Lab Results  Component Value Date   TESTOSTERONE  339 05/01/2024    Lab Results  Component Value Date   HGBA1C 5.9 (H) 05/01/2024    Urinalysis    Component Value Date/Time   COLORURINE YELLOW 01/31/2024 2223   APPEARANCEUR CLEAR 01/31/2024 2223   APPEARANCEUR Clear 06/24/2023 1509   LABSPEC <1.005 (L) 01/31/2024 2223   PHURINE 6.5 01/31/2024 2223   GLUCOSEU NEGATIVE 01/31/2024 2223   HGBUR NEGATIVE 01/31/2024 2223   BILIRUBINUR NEGATIVE 01/31/2024 2223   BILIRUBINUR Negative 06/24/2023 1509   KETONESUR NEGATIVE 01/31/2024 2223   PROTEINUR NEGATIVE 01/31/2024 2223   NITRITE NEGATIVE 01/31/2024 2223   LEUKOCYTESUR NEGATIVE 01/31/2024 2223    Lab Results  Component Value Date   LABMICR Comment 06/24/2023   WBCUA 0-5 07/22/2018   RBCUA 0-2 07/22/2018   LABEPIT 0-10 07/22/2018   MUCUS Present 07/22/2018   BACTERIA Few 07/22/2018    Pertinent Imaging:  No results found for this or any previous visit.  No results found for this or any previous visit.  No results found for this or any previous visit.  No results found for this or any previous visit.  No results found for this or any previous visit.  No results found for this or any previous visit.  No results found for this or any previous visit.  No results found for this or any previous visit.   Assessment & Plan:    1. Nocturia (Primary) -decrease fluid intake within 2 hours of going to bed -we will trial uroxatral 10mg  at bedtime -if his nocturia fails to improve he will be sent of OSA evaluation - BLADDER SCAN AMB NON-IMAGING  2. Benign prostatic hyperplasia with urinary obstruction We will trial uroxatral 10mg     No follow-ups on file.  Belvie Clara, MD  [facility] [place]

## 2024-05-04 NOTE — Progress Notes (Signed)
post void residual=59 

## 2024-05-05 NOTE — Telephone Encounter (Signed)
 Last refill

## 2024-05-06 ENCOUNTER — Ambulatory Visit: Admitting: Physician Assistant

## 2024-05-06 DIAGNOSIS — Z96652 Presence of left artificial knee joint: Secondary | ICD-10-CM

## 2024-05-06 NOTE — Progress Notes (Signed)
 Post-Op Visit Note   Patient: Levi Alvarez           Date of Birth: July 11, 1971           MRN: [id number] Visit Date: 05/06/2024 PCP: Bevely Doffing, FNP   Assessment & Plan:  Chief Complaint:  Chief Complaint  Patient presents with   Left Knee - Pain   Visit Diagnoses:  1. Status post total left knee replacement     Plan: Patient is a pleasant 53 year old gentleman comes in today with concerns about his left knee.  He is little over 3 months left total knee replacement 01/27/2024.  He noticed small pea-sized area to the proximal third of his incision about 2 weeks ago.  About a week and a half ago, there is a pin hole sized area that started to drain pink fluid.  He denies any fevers or chills or other constitutional symptoms.  The only pain he has is an occasional bee sting sensation to the area.  Examination of the left knee shows a well-healed surgical incision.  The area of concern is pea-sized and to the proximal third of the incision.  This feels more like a herniation of the tissue.  I am unable to express any fluid.  No erythema, cellulitis or evidence of infection.  This is nontender.  Painless range of motion of the left knee.  At this point, there is no evidence of infection or cellulitis.  It feels more as though there is a herniation of tissue.  He will continue to advance with activity as tolerated.  Follow-up with us  in 3 months for repeat evaluation and 2 view x-rays of the left knee.  Call with concerns or questions.  Follow-Up Instructions: Return in about 3 months (around 08/06/2024).   Orders:  No orders of the defined types were placed in this encounter.  No orders of the defined types were placed in this encounter.   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   GERD (gastroesophageal reflux disease) 12/04/2023   History of hepatitis C virus infection 12/04/2023   Erectile dysfunction 12/04/2023   Hypogonadism in male 12/04/2023   MDD (major depressive disorder) 12/04/2023   Insomnia 12/04/2023   Slow urinary stream 12/04/2023   Hyponatremia 01/23/2022   Alcohol withdrawal (HCC) 01/22/2022   Class 1 obesity 01/22/2022   Alcohol withdrawal syndrome without complication (HCC)    Alcohol dependence with withdrawal with complication (HCC) 04/20/2021   Marijuana abuse, continuous 04/20/2021   Pancreatitis, acute 04/10/2021   Hypotension 04/10/2021   Chest pain 04/10/2021   Esophageal varices without bleeding (HCC)    Abnormal CT scan, colon    Benign neoplasm of colon    Bleeding esophageal varices (HCC)    Spondylosis without myelopathy or radiculopathy, cervical region 12/03/2018   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   Post-traumatic osteoarthritis of left knee 12/03/2018   Chronic obstructive pulmonary disease (HCC) 12/02/2018   Upper GI bleed 11/24/2018   Acute blood loss anemia 11/24/2018   Alcohol abuse 11/24/2018   Chronic back pain 11/24/2018   Chronic hepatitis C without hepatic coma (HCC) 10/08/2017  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   Chronic pain of left knee 07/23/2017   Essential hypertension 07/12/2017   OSA (obstructive sleep apnea) 07/12/2017   Class 2 obesity due to excess calories with body mass index (BMI) of 35.0 to 35.9 in adult 07/12/2017   Elevated liver enzymes 07/12/2017   Other chronic pain 07/12/2017   Smoker 07/12/2017   Hyperlipidemia 07/12/2017   Polyarthralgia 07/12/2017   Snoring 06/05/2017   Past Medical History:  Diagnosis Date   Alcoholism (HCC)    Blood transfusion without reported  diagnosis    jan, 2020 3 units PRBC   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   Constipation    Resolved   COPD (chronic obstructive pulmonary disease) (HCC)    DDD (degenerative disc disease), cervical    Diverticulosis    ED (erectile dysfunction)    Esophageal varices (HCC)    GERD (gastroesophageal reflux disease)    H/O: upper GI bleed    Hepatitis    Hepatitis C antibody positive in blood    resolved   High blood pressure    History of anemia    Internal hemorrhoids    Left inguinal hernia 2020   Lung nodule    Small   Portal hypertensive gastropathy (HCC)    Sleep apnea    trying to get used to his CPAP, not wearing CPAP per SO [name] McAdoo   Umbilical hernia 2020    Family History  Problem Relation Age of Onset   Colitis Mother    Arthritis Mother    Dementia Mother    Cancer Father        lymphoma?   Cancer Paternal Grandfather        bone   Heart disease Neg Hx    Stroke Neg Hx    Diabetes Neg Hx    Colon cancer Neg Hx    Esophageal cancer Neg Hx    Stomach cancer Neg Hx    Rectal cancer Neg Hx     Past Surgical History:  Procedure Laterality Date   arm surgery     left arm,   COLONOSCOPY WITH PROPOFOL  N/A 01/19/2019   Procedure: COLONOSCOPY WITH PROPOFOL ;  Surgeon: Leigh Elspeth SQUIBB, MD;  Location: [facility];  Service: Gastroenterology;  Laterality: N/A;   ESOPHAGEAL BANDING  11/25/2018   Procedure: ESOPHAGEAL BANDING;  Surgeon: Teressa Toribio SQUIBB, MD;  [facility];  Service: Endoscopy;;   ESOPHAGEAL BANDING  01/19/2019   Procedure: ESOPHAGEAL BANDING;  Surgeon: Leigh Elspeth SQUIBB, MD;  Location: [facility];  Service: Gastroenterology;;   ESOPHAGEAL BANDING N/A 01/24/2021   Procedure: ESOPHAGEAL BANDING;  Surgeon: Leigh Elspeth SQUIBB, MD;  [facility];  Service: Gastroenterology;  Laterality: N/A;   ESOPHAGOGASTRODUODENOSCOPY (EGD) WITH PROPOFOL  N/A 11/25/2018   Procedure: ESOPHAGOGASTRODUODENOSCOPY (EGD) WITH  PROPOFOL ;  Surgeon: Teressa Toribio SQUIBB, MD;  [facility];  Service: Endoscopy;  Laterality: N/A;   ESOPHAGOGASTRODUODENOSCOPY (EGD) WITH PROPOFOL  N/A 01/19/2019   Procedure: ESOPHAGOGASTRODUODENOSCOPY (EGD) WITH PROPOFOL ;  Surgeon: Leigh Elspeth SQUIBB, MD;  [facility];  Service: Gastroenterology;  Laterality: N/A;   ESOPHAGOGASTRODUODENOSCOPY (EGD) WITH PROPOFOL  N/A 04/28/2019   Procedure: ESOPHAGOGASTRODUODENOSCOPY (EGD) WITH PROPOFOL ;  Surgeon: Leigh Elspeth SQUIBB, MD;  [facility];  Service: Gastroenterology;  Laterality: N/A;   ESOPHAGOGASTRODUODENOSCOPY (EGD) WITH PROPOFOL  N/A 11/03/2019   Procedure: ESOPHAGOGASTRODUODENOSCOPY (EGD) WITH PROPOFOL ;  Surgeon: Legrand Victory LITTIE DOUGLAS, MD;  Location: [facility];  Service: Gastroenterology;  Laterality: N/A;  ESOPHAGOGASTRODUODENOSCOPY (EGD) WITH PROPOFOL  N/A 01/24/2021   Procedure: ESOPHAGOGASTRODUODENOSCOPY (EGD) WITH PROPOFOL ;  Surgeon: Leigh Elspeth SQUIBB, MD;  [facility]: [facility];  Service: Gastroenterology;  Laterality: N/A;   KNEE SURGERY Left    POLYPECTOMY  01/19/2019   Procedure: POLYPECTOMY;  Surgeon: Leigh Elspeth SQUIBB, MD;  Location: [facility];  Service: Gastroenterology;;   TOTAL KNEE ARTHROPLASTY Left 01/27/2024   Procedure: LEFT TOTAL KNEE ARTHROPLASTY;  Surgeon: Jerri Kay HERO, MD;  Location[facility];  Service: Orthopedics;  Laterality: Left;   Social History   Occupational History   Occupation: disabled  Tobacco Use   Smoking status: Every Day    Current packs/day: 0.50    Average packs/day: 0.5 packs/day for 30.0 years (15.0 ttl pk-yrs)    Types: Cigarettes    Passive exposure: Current   Smokeless tobacco: Former    Types: Engineer, drilling   Vaping status: Never Used  Substance and Sexual Activity   Alcohol use: Not Currently    Alcohol/week: 36.0 standard drinks of alcohol    Types: 36 Cans of beer per week    Comment: quit March 2023   Drug use: Yes    Types: Marijuana     Comment: daily use - informed to withhold 24-48 hrs prior to procedure   Sexual activity: Yes    Partners: Female

## 2024-05-22 ENCOUNTER — Encounter: Payer: Self-pay | Admitting: Gastroenterology

## 2024-05-22 ENCOUNTER — Ambulatory Visit (AMBULATORY_SURGERY_CENTER): Admitting: *Deleted

## 2024-05-22 VITALS — Ht 70.0 in | Wt 205.0 lb

## 2024-05-22 DIAGNOSIS — I8500 Esophageal varices without bleeding: Secondary | ICD-10-CM

## 2024-05-22 NOTE — Progress Notes (Signed)
 Pt's name and DOB verified at the beginning of the pre-visit wit 2 identifiers    Pt denies any difficulty with ambulating,sitting, laying down or rolling side to side  Pt has no issues moving head neck or swallowing  No egg or soy allergy known to patient   No issues known to pt with past sedation with any surgeries or procedures  Patient denies ever being intubated  No FH of Malignant Hyperthermia  Pt is not on home 02   Pt is not on blood thinners   Pt denies issues with constipation   Pt is not on dialysis  Pt denise any abnormal heart rhythms   Pt denies any upcoming cardiac testing  Patient's chart reviewed by Norleen Schillings CNRA prior to pre-visit and patient appropriate for the [facility].  Pre-visit completed and red dot placed by patient's name on their procedure day (on provider's schedule).    Visit by phone  Pt states weight is 205 lb  Instructed pt to not smoke Marijuana day before and day of procedure.  IInstructions reviewed. Pt given  both [facility] main # and MD on call # prior to instructions.  Pt states understanding of instructions. Instructed pt to review instructions again prior to procedure and call main # given if has questions.. Pt states they will.   Instructed pt on where to find instructions on My Chart.

## 2024-05-25 ENCOUNTER — Other Ambulatory Visit: Payer: Self-pay

## 2024-05-25 MED ORDER — ALFUZOSIN HCL ER 10 MG PO TB24: 10.0000 mg | ORAL_TABLET | Freq: Every day | ORAL | 3 refills | Status: DC

## 2024-05-25 NOTE — Progress Notes (Signed)
 Patient requested a 90 day supply of Alfuzosin .  Rx updated and patient notified.

## 2024-05-26 ENCOUNTER — Other Ambulatory Visit: Payer: Self-pay | Admitting: Physician Assistant

## 2024-06-02 ENCOUNTER — Ambulatory Visit: Payer: Medicare HMO | Admitting: Internal Medicine

## 2024-06-03 ENCOUNTER — Ambulatory Visit

## 2024-06-05 ENCOUNTER — Ambulatory Visit: Admitting: Gastroenterology

## 2024-06-05 ENCOUNTER — Encounter: Payer: Self-pay | Admitting: Gastroenterology

## 2024-06-05 VITALS — BP 138/83 | HR 64 | Temp 98.1°F | Resp 17 | Ht 70.0 in | Wt 205.0 lb

## 2024-06-05 DIAGNOSIS — I8500 Esophageal varices without bleeding: Secondary | ICD-10-CM | POA: Diagnosis present

## 2024-06-05 DIAGNOSIS — K3189 Other diseases of stomach and duodenum: Secondary | ICD-10-CM | POA: Diagnosis not present

## 2024-06-05 MED ORDER — SODIUM CHLORIDE 0.9 % IV SOLN: 500.0000 mL | INTRAVENOUS | Status: DC

## 2024-06-05 NOTE — Op Note (Signed)
 [facility] Patient Name: Levi Alvarez Procedure Date: 06/05/2024 1:44 PM MRN: [id number] Endoscopist: Elspeth P. Leigh , MD, [id number] Age: 53 Referring MD:  Date of Birth: 1971/10/05 Gender: Male Account #: [id number] Procedure:                Upper GI endoscopy Indications:              surveillance of esophageal varices - history of                            bleeding varices s/p banding, now on Coreg . Has had                            relapse of alcohol use recently. Last EGD 2023                            showing small varices. Medicines:                Monitored Anesthesia Care Procedure:                Pre-Anesthesia Assessment:                           - Prior to the procedure, a History and Physical                            was performed, and patient medications and                            allergies were reviewed. The patient's tolerance of                            previous anesthesia was also reviewed. The risks                            and benefits of the procedure and the sedation                            options and risks were discussed with the patient.                            All questions were answered, and informed consent                            was obtained. Prior Anticoagulants: The patient has                            taken no anticoagulant or antiplatelet agents. ASA                            Grade Assessment: III - A patient with severe                            systemic disease. After reviewing the risks and  benefits, the patient was deemed in satisfactory                            condition to undergo the procedure.                           After obtaining informed consent, the endoscope was                            passed under direct vision. Throughout the                            procedure, the patient's blood pressure, pulse, and                            oxygen saturations were  monitored continuously. The                            Olympus Scope [id number] was introduced through the                            mouth, and advanced to the second part of duodenum.                            The upper GI endoscopy was accomplished without                            difficulty. The patient tolerated the procedure                            well. Scope In: Scope Out: Findings:                 Esophagogastric landmarks were identified: the                            Z-line was found at 42 cm, the gastroesophageal                            junction was found at 42 cm and the upper extent of                            the gastric folds was found at 42 cm from the                            incisors.                           The Z-line was slightly irregular but did not meet                            criteria for Barrett's.                           Grade I varices were found in the  lower third of                            the esophagus. They were small in size. No high                            risk stigmata for bleeding.                           The exam of the esophagus was otherwise normal.                           Mild portal hypertensive gastropathy was found in                            the gastric fundus and in the gastric body.                           The exam of the stomach was otherwise normal.                           The examined duodenum was normal. Complications:            No immediate complications. Estimated blood loss:                            None. Estimated Blood Loss:     Estimated blood loss: none. Impression:               - Esophagogastric landmarks identified.                           - Z-line irregular but did not meet criteria for                            Barrett's                           - Grade I esophageal varices.                           - Portal hypertensive gastropathy.                           - Normal examined  duodenum. Recommendation:           - Patient has a contact number available for                            emergencies. The signs and symptoms of potential                            delayed complications were discussed with the                            patient. Return to normal activities tomorrow.  Written discharge instructions were provided to the                            patient.                           - Resume previous diet.                           - Continue present medications.                           - Continue Coreg  for treatment of varices                           - Abstain from alcohol                           - Repeat upper endoscopy in 1 year for surveillance. Elspeth P. Malayjah Otoole, MD 06/05/2024 2:12:21 PM This report has been signed electronically.

## 2024-06-05 NOTE — Progress Notes (Signed)
 [facility] History and Physical   Primary Care Physician:  Bevely Doffing, FNP   Reason for Procedure:   Esophageal varices surveillance  Plan:    EGD     HPI: Levi Alvarez is a 53 y.o. male  here for EGD to survey esophageal varices. History of cirrhosis with variceal bleeding in the past, s/p banding. On Coreg . Last exam 2023 which showed small varices, here for surveillance to ensure no recurrence / worsening. He has had some alcohol relapse since I have seen him, he states no alcohol for the past week and plans to abstain. HE states he is compliant with Coreg .    Otherwise feels well without any cardiopulmonary symptoms.   I have discussed risks / benefits of anesthesia and endoscopic procedure with Levi Alvarez and they wish to proceed with the exams as outlined today.    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   Constipation    Resolved   COPD (chronic obstructive pulmonary disease) (HCC)    DDD (degenerative disc disease), cervical    Diverticulosis    ED (erectile dysfunction)    Esophageal varices (HCC)    Esophageal varices (HCC)    GERD (gastroesophageal reflux disease)    H/O: upper GI bleed    Hepatitis    Hepatitis C antibody positive in blood    resolved   High blood pressure    History of anemia    Internal hemorrhoids    Left inguinal hernia 2020   Lung nodule    Small   Portal hypertensive gastropathy (HCC)    Sleep apnea    trying to get used to his CPAP, not wearing CPAP per SO Levi Alvarez   Substance abuse (HCC)    Umbilical hernia 2020    Past Surgical History:  Procedure Laterality Date   arm surgery     left arm,   COLONOSCOPY WITH PROPOFOL  N/A 01/19/2019   Procedure: COLONOSCOPY WITH PROPOFOL ;  Surgeon: Leigh Elspeth SQUIBB, MD;  Location: [facility];  Service: Gastroenterology;  Laterality: N/A;   ESOPHAGEAL BANDING  11/25/2018   Procedure: ESOPHAGEAL BANDING;  Surgeon: Teressa Toribio SQUIBB, MD;  Location: [facility];  Service: Endoscopy;;   ESOPHAGEAL BANDING  01/19/2019   Procedure: ESOPHAGEAL BANDING;  Surgeon: Leigh Elspeth SQUIBB, MD;  Location: [facility];  Service: Gastroenterology;;   ESOPHAGEAL BANDING N/A 01/24/2021   Procedure: ESOPHAGEAL BANDING;  Surgeon: Leigh Elspeth SQUIBB, MD;  Location: [facility];  Service: Gastroenterology;  Laterality: N/A;   ESOPHAGOGASTRODUODENOSCOPY (EGD) WITH PROPOFOL  N/A 11/25/2018   Procedure: ESOPHAGOGASTRODUODENOSCOPY (EGD) WITH PROPOFOL ;  Surgeon: Teressa Toribio SQUIBB, MD;  Location: [facility];  Service: Endoscopy;  Laterality: N/A;   ESOPHAGOGASTRODUODENOSCOPY (EGD) WITH PROPOFOL  N/A 01/19/2019   Procedure: ESOPHAGOGASTRODUODENOSCOPY (EGD) WITH PROPOFOL ;  Surgeon: Leigh Elspeth SQUIBB, MD;  Location: [facility];  Service: Gastroenterology;  Laterality: N/A;   ESOPHAGOGASTRODUODENOSCOPY (EGD) WITH PROPOFOL  N/A 04/28/2019   Procedure: ESOPHAGOGASTRODUODENOSCOPY (EGD) WITH PROPOFOL ;  Surgeon: Leigh Elspeth SQUIBB, MD;  Location: [facility];  Service: Gastroenterology;  Laterality: N/A;   ESOPHAGOGASTRODUODENOSCOPY (EGD) WITH PROPOFOL  N/A 11/03/2019   Procedure: ESOPHAGOGASTRODUODENOSCOPY (EGD) WITH PROPOFOL ;  Surgeon: Legrand Victory LITTIE DOUGLAS, MD;  Location: [facility];  Service: Gastroenterology;  Laterality: N/A;   ESOPHAGOGASTRODUODENOSCOPY (EGD) WITH PROPOFOL   N/A 01/24/2021   Procedure: ESOPHAGOGASTRODUODENOSCOPY (EGD) WITH PROPOFOL ;  Surgeon: Leigh Elspeth SQUIBB, MD;  Location: [facility];  Service: Gastroenterology;  Laterality: N/A;   KNEE SURGERY Left    POLYPECTOMY  01/19/2019   Procedure: POLYPECTOMY;  Surgeon: Leigh Elspeth SQUIBB, MD;  Location: [facility];  Service: Gastroenterology;;   TOTAL KNEE ARTHROPLASTY Left 01/27/2024   Procedure: LEFT TOTAL KNEE ARTHROPLASTY;  Surgeon:  Jerri Kay HERO, MD;  Location: [facility];  Service: Orthopedics;  Laterality: Left;    Prior to Admission medications   Medication Sig Start Date End Date Taking? Authorizing Provider  alfuzosin  (UROXATRAL ) 10 MG 24 hr tablet Take 1 tablet (10 mg total) by mouth at bedtime. 05/25/24  Yes McKenzie, Belvie CROME, MD  carvedilol  (COREG ) 6.25 MG tablet Take 1 tablet (6.25 mg total) by mouth 2 (two) times daily with a meal. 06/18/22  Yes Marvine Norris L, DO  folic acid  (FOLVITE ) 1 MG tablet Take 1 mg by mouth in the morning.   Yes [provider]  loratadine  (CLARITIN ) 10 MG tablet Take 10 mg by mouth daily.   Yes [provider]  Magnesium  250 MG CAPS Take 250 mg by mouth daily.   Yes [provider]  MILK THISTLE PO Take 1 capsule by mouth 2 (two) times daily.   Yes [provider]  Multiple Vitamin (MULTIVITAMIN WITH MINERALS) TABS tablet Take 1 tablet by mouth daily with breakfast.   Yes [provider]  pantoprazole  (PROTONIX ) 40 MG tablet Take 1 tablet (40 mg total) by mouth daily. 04/27/24  Yes Bevely Doffing, FNP  Thiamine  HCl (VITAMIN B1) 100 MG TABS Take 1 tablet (100 mg total) by mouth in the morning. 01/29/22  Yes Marvine Norris L, DO  traZODone  (DESYREL ) 100 MG tablet Take 1 tablet (100 mg total) by mouth at bedtime. 04/13/24  Yes Melvenia Manus BRAVO, MD  venlafaxine  XR (EFFEXOR -XR) 75 MG 24 hr capsule TAKE 1 CAPSULE BY MOUTH DAILY. 08/15/20  Yes Lavoie, Valerie, NP  VENTOLIN  HFA 108 (90 Base) MCG/ACT inhaler INHALE 1 PUFF EVERY 6 (SIX) HOURS AS NEEDED INTO THE LUNGS FOR WHEEZING OR SHORTNESS OF BREATH. 05/19/20  Yes Newlin, Enobong, MD  BD DISP NEEDLE 23G X 1 MISC SMARTSIG:injection Once a Week 05/18/24   [provider]  methocarbamol  (ROBAXIN ) 750 MG tablet TAKE 1 TABLET(750 MG) BY MOUTH THREE TIMES DAILY AS NEEDED FOR MUSCLE SPASMS 05/26/24   Jule Ronal CROME, PA-C  sildenafil  (VIAGRA ) 100 MG tablet 1/2-1 tab po prn 06/24/23   Matilda Senior,  MD  testosterone  cypionate (DEPOTESTOSTERONE CYPIONATE) 200 MG/ML injection Inject 0.5 mLs (100 mg total) into the skin once a week. 03/26/24   Melvenia Manus BRAVO, MD    Current Outpatient Medications  Medication Sig Dispense Refill   alfuzosin  (UROXATRAL ) 10 MG 24 hr tablet Take 1 tablet (10 mg total) by mouth at bedtime. 90 tablet 3   carvedilol  (COREG ) 6.25 MG tablet Take 1 tablet (6.25 mg total) by mouth 2 (two) times daily with a meal. 60 tablet 3   folic acid  (FOLVITE ) 1 MG tablet Take 1 mg by mouth in the morning.     loratadine  (CLARITIN ) 10 MG tablet Take 10 mg by mouth daily.     Magnesium  250 MG CAPS Take 250 mg by mouth daily.     MILK THISTLE PO Take 1 capsule by mouth 2 (two) times daily.     Multiple Vitamin (MULTIVITAMIN WITH MINERALS) TABS tablet Take 1 tablet by mouth daily  with breakfast.     pantoprazole  (PROTONIX ) 40 MG tablet Take 1 tablet (40 mg total) by mouth daily. 90 tablet 0   Thiamine  HCl (VITAMIN B1) 100 MG TABS Take 1 tablet (100 mg total) by mouth in the morning. 30 tablet 3   traZODone  (DESYREL ) 100 MG tablet Take 1 tablet (100 mg total) by mouth at bedtime. 90 tablet 1   venlafaxine  XR (EFFEXOR -XR) 75 MG 24 hr capsule TAKE 1 CAPSULE BY MOUTH DAILY. 30 capsule 0   VENTOLIN  HFA 108 (90 Base) MCG/ACT inhaler INHALE 1 PUFF EVERY 6 (SIX) HOURS AS NEEDED INTO THE LUNGS FOR WHEEZING OR SHORTNESS OF BREATH. 18 g 1   BD DISP NEEDLE 23G X 1 MISC SMARTSIG:injection Once a Week     methocarbamol  (ROBAXIN ) 750 MG tablet TAKE 1 TABLET(750 MG) BY MOUTH THREE TIMES DAILY AS NEEDED FOR MUSCLE SPASMS 30 tablet 1   sildenafil  (VIAGRA ) 100 MG tablet 1/2-1 tab po prn 20 tablet prn   testosterone  cypionate (DEPOTESTOSTERONE CYPIONATE) 200 MG/ML injection Inject 0.5 mLs (100 mg total) into the skin once a week. 10 mL 0   Current Facility-Administered Medications  Medication Dose Route Frequency Provider Last Rate Last Admin   0.9 %  sodium chloride  infusion  500 mL Intravenous  Continuous Dilcia Rybarczyk, Elspeth SQUIBB, MD        Allergies as of 06/05/2024 - Review Complete 06/05/2024  Allergen Reaction Noted   Gabapentin Other (See Comments) 03/17/2021   Onion Swelling 08/13/2021   Naproxen Hives and Swelling 03/03/2015    Family History  Problem Relation Age of Onset   Colitis Mother    Arthritis Mother    Dementia Mother    Cancer Father        lymphoma?   Cancer Paternal Grandfather        bone   Heart disease Neg Hx    Stroke Neg Hx    Diabetes Neg Hx    Colon cancer Neg Hx    Colon polyps Neg Hx    Esophageal cancer Neg Hx    Rectal cancer Neg Hx    Stomach cancer Neg Hx     Social History   Socioeconomic History   Marital status: Divorced    Spouse name: Not on file   Number of children: 2   Years of education: Not on file   Highest education level: GED or equivalent  Occupational History   Occupation: disabled  Tobacco Use   Smoking status: Every Day    Current packs/day: 0.50    Average packs/day: 0.5 packs/day for 30.0 years (15.0 ttl pk-yrs)    Types: Cigarettes    Passive exposure: Current   Smokeless tobacco: Former    Types: Engineer, drilling   Vaping status: Never Used  Substance and Sexual Activity   Alcohol use: Not Currently    Alcohol/week: 36.0 standard drinks of alcohol    Types: 36 Cans of beer per week    Comment: quit March 2023   Drug use: Yes    Types: Marijuana    Comment: RN attempted to reach pt per protocol x2 on 7/10.  No call to reschedule PV made. Canceled procedure per protocol.   Sexual activity: Yes    Partners: Female  Other Topics Concern   Not on file  Social History Narrative   Not on file   Social Drivers of Health   Financial Resource Strain: Low Risk  (06/01/2024)   Overall Financial Resource Strain (CARDIA)  Difficulty of Paying Living Expenses: Not hard at all  Food Insecurity: No Food Insecurity (06/01/2024)   Hunger Vital Sign    Worried About Running Out of Food in the Last Year:  Never true    Ran Out of Food in the Last Year: Never true  Transportation Needs: No Transportation Needs (06/01/2024)   PRAPARE - Administrator, Civil Service (Medical): No    Lack of Transportation (Non-Medical): No  Physical Activity: Inactive (06/01/2024)   Exercise Vital Sign    Days of Exercise per Week: 0 days    Minutes of Exercise per Session: Not on file  Stress: No Stress Concern Present (06/01/2024)   [employer] of Occupational Health - Occupational Stress Questionnaire    Feeling of Stress: Only a little  Social Connections: Moderately Isolated (06/01/2024)   Social Connection and Isolation Panel    Frequency of Communication with Friends and Family: Three times a week    Frequency of Social Gatherings with Friends and Family: Twice a week    Attends Religious Services: Never    Database administrator or Organizations: No    Attends Engineer, structural: Not on file    Marital Status: Living with partner  Intimate Partner Violence: Unknown (02/16/2022)   Received from [facility]   HITS    Physically Hurt: Not on file    Insult or Talk Down To: Not on file    Threaten Physical Harm: Not on file    Scream or Curse: Not on file    Review of Systems: All other review of systems negative except as mentioned in the HPI.  Physical Exam: Vital signs BP 133/85   Pulse (!) 55   Temp 98.1 F (36.7 C)   Ht 5' 10 (1.778 m)   Wt 205 lb (93 kg)   SpO2 98%   BMI 29.41 kg/m   General:   Alert,  Well-developed, pleasant and cooperative in NAD Lungs:  Clear throughout to auscultation.   Heart:  Regular rate and rhythm Abdomen:  Soft, nontender and nondistended.   Neuro/Psych:  Alert and cooperative. Normal mood and affect. A and O x 3  Marcey Naval, MD [facility]

## 2024-06-05 NOTE — Patient Instructions (Signed)

## 2024-06-05 NOTE — Progress Notes (Signed)
 Pt sedate, gd SR's, VSS, report to RN

## 2024-06-05 NOTE — Progress Notes (Signed)
 Pt's states no medical or surgical changes since previsit or office visit.

## 2024-06-08 ENCOUNTER — Telehealth: Payer: Self-pay

## 2024-06-08 DIAGNOSIS — K7030 Alcoholic cirrhosis of liver without ascites: Secondary | ICD-10-CM

## 2024-06-08 DIAGNOSIS — I8500 Esophageal varices without bleeding: Secondary | ICD-10-CM

## 2024-06-08 NOTE — Telephone Encounter (Signed)
-----   Message from Elspeth SHAUNNA Naval sent at 06/05/2024  4:27 PM EDT ----- Regarding: outpatient follow up Jan can you help with the following: -needs routine outpatient follow up -needs RUQ US  for [facility] screening / cirrhosis -needs CBC, CMET, INR, AFP.   Thanks

## 2024-06-08 NOTE — Telephone Encounter (Signed)
 Unable to leave message mailbox full.

## 2024-06-08 NOTE — Telephone Encounter (Signed)
 Called patient and spoke to his girlfriend, Madeline.  Scheduled patient for appointment in Sept with Dr. Leigh. Scheduled him for ultrasound on 8-7.  Appointment info sent to MyChart per patient request.  She understands he needs to go to the lab for blood work when he has his ultrasound.

## 2024-06-14 ENCOUNTER — Other Ambulatory Visit: Payer: Self-pay | Admitting: Physician Assistant

## 2024-06-18 ENCOUNTER — Ambulatory Visit (HOSPITAL_COMMUNITY)
Admission: RE | Admit: 2024-06-18 | Discharge: 2024-06-18 | Disposition: A | Discharge status: Home / Self Care | Source: Ambulatory Visit | Attending: Gastroenterology | Admitting: Gastroenterology

## 2024-06-18 ENCOUNTER — Other Ambulatory Visit (INDEPENDENT_AMBULATORY_CARE_PROVIDER_SITE_OTHER)

## 2024-06-18 DIAGNOSIS — K7030 Alcoholic cirrhosis of liver without ascites: Secondary | ICD-10-CM | POA: Insufficient documentation

## 2024-06-18 DIAGNOSIS — I8500 Esophageal varices without bleeding: Secondary | ICD-10-CM | POA: Diagnosis present

## 2024-06-18 LAB — COMPREHENSIVE METABOLIC PANEL WITH GFR
ALT: 21 U/L (ref 0–53)
AST: 25 U/L (ref 0–37)
Albumin: 4.3 g/dL (ref 3.5–5.2)
Alkaline Phosphatase: 115 U/L (ref 39–117)
BUN: 10 mg/dL (ref 6–23)
CO2: 28 meq/L (ref 19–32)
Calcium: 9.4 mg/dL (ref 8.4–10.5)
Chloride: 101 meq/L (ref 96–112)
Creatinine, Ser: 0.86 mg/dL (ref 0.40–1.50)
GFR: 99.52 mL/min (ref >60.00)
Glucose, Bld: 93 mg/dL (ref 70–99)
Potassium: 4.4 meq/L (ref 3.5–5.1)
Sodium: 141 meq/L (ref 135–145)
Total Bilirubin: 0.6 mg/dL (ref 0.2–1.2)
Total Protein: 7.5 g/dL (ref 6.0–8.3)

## 2024-06-18 LAB — CBC WITH DIFFERENTIAL/PLATELET
Basophils Absolute: 0 K/uL (ref 0.0–0.1)
Basophils Relative: 0.6 % (ref 0.0–3.0)
Eosinophils Absolute: 0.2 K/uL (ref 0.0–0.7)
Eosinophils Relative: 2.5 % (ref 0.0–5.0)
HCT: 41.4 % (ref 39.0–52.0)
Hemoglobin: 13.2 g/dL (ref 13.0–17.0)
Lymphocytes Relative: 24.4 % (ref 12.0–46.0)
Lymphs Abs: 1.8 K/uL (ref 0.7–4.0)
MCHC: 32 g/dL (ref 30.0–36.0)
MCV: 78.9 fl (ref 78.0–100.0)
Monocytes Absolute: 0.5 K/uL (ref 0.1–1.0)
Monocytes Relative: 6.6 % (ref 3.0–12.0)
Neutro Abs: 5 K/uL (ref 1.4–7.7)
Neutrophils Relative %: 65.9 % (ref 43.0–77.0)
Platelets: 137 K/uL — ABNORMAL LOW (ref 150.0–400.0)
RBC: 5.25 Mil/uL (ref 4.22–5.81)
RDW: 21.7 % — ABNORMAL HIGH (ref 11.5–15.5)
WBC: 7.6 K/uL (ref 4.0–10.5)

## 2024-06-18 LAB — PROTIME-INR
INR: 1.2 ratio — ABNORMAL HIGH (ref 0.8–1.0)
Prothrombin Time: 12.2 s (ref 9.6–13.1)

## 2024-06-19 ENCOUNTER — Ambulatory Visit: Payer: Self-pay | Admitting: Gastroenterology

## 2024-06-19 DIAGNOSIS — K7030 Alcoholic cirrhosis of liver without ascites: Secondary | ICD-10-CM

## 2024-06-19 DIAGNOSIS — I8500 Esophageal varices without bleeding: Secondary | ICD-10-CM

## 2024-06-22 LAB — AFP TUMOR MARKER: AFP-Tumor Marker: 2.4 ng/mL (ref <6.1)

## 2024-06-25 ENCOUNTER — Other Ambulatory Visit: Payer: Self-pay

## 2024-06-25 ENCOUNTER — Telehealth: Payer: Self-pay | Admitting: Physician Assistant

## 2024-06-25 MED ORDER — VENLAFAXINE HCL ER 75 MG PO CP24: 75.0000 mg | ORAL_CAPSULE | Freq: Every day | ORAL | 1 refills | Status: DC

## 2024-06-25 NOTE — Telephone Encounter (Signed)
 Copied from CRM [id number]. Topic: Clinical - Medication Refill >> Jun 25, 2024 11:12 AM Nathanel BROCKS wrote: Medication: venlafaxine  XR (EFFEXOR -XR) 75 MG 24 hr capsule (90 day supply)  Has the patient contacted their pharmacy? Yes  This is the patient's preferred pharmacy:  [id number] - [place], [place] - [address] AT SEC [address] [place] [address] Phone: [phone] Fax: [phone]  Is this the correct pharmacy for this prescription? Yes If no, delete pharmacy and type the correct one.   Has the prescription been filled recently? Yes  Is the patient out of the medication? Yes  Has the patient been seen for an appointment in the last year OR does the patient have an upcoming appointment? Yes  Can we respond through MyChart? Yes  Agent: Please be advised that Rx refills may take up to 3 business days. We ask that you follow-up with your pharmacy.

## 2024-06-25 NOTE — Telephone Encounter (Signed)
 Called and left pt 1X vm for pt to reschedule. Provider will not be in office pt need next available appt

## 2024-07-01 ENCOUNTER — Other Ambulatory Visit: Payer: Self-pay | Admitting: Physician Assistant

## 2024-07-03 ENCOUNTER — Other Ambulatory Visit: Payer: Self-pay

## 2024-07-03 DIAGNOSIS — N5201 Erectile dysfunction due to arterial insufficiency: Secondary | ICD-10-CM

## 2024-07-03 MED ORDER — SILDENAFIL CITRATE 100 MG PO TABS: 50.0000 mg | ORAL_TABLET | Freq: Every day | ORAL | 5 refills | Status: DC | PRN

## 2024-07-15 ENCOUNTER — Ambulatory Visit: Admitting: Physician Assistant

## 2024-07-17 ENCOUNTER — Ambulatory Visit

## 2024-07-21 ENCOUNTER — Ambulatory Visit: Admitting: Physician Assistant

## 2024-07-24 ENCOUNTER — Ambulatory Visit: Admitting: Physician Assistant

## 2024-08-03 ENCOUNTER — Ambulatory Visit: Admitting: Gastroenterology

## 2024-08-03 NOTE — Progress Notes (Deleted)
 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  Recall he was previously treated for hep C with undetectable viral load.  He has a history of bleeding esophageal varices in January 22 when he relapsed with alcohol, he underwent banding at that time and has had surveillance EGDs and on Coreg  since then.  On prior visits in recent years he had relapsed with significant alcohol use while dealing with life stressors and taking care of his mother who had dementia.  He was actually mated to the hospital for alcohol detox and experienced withdrawal symptoms and was in the ICU for few days.   He has been doing really well since have seen him.  He has been off all alcohol for the past 1.5 years without any relapse.  He is doing well.  Since his last endoscopy we had switched back his beta-blocker from atenolol  to Coreg .  He is taking that twice daily and compliant.  No jaundice, no edema, no internal bleeding, no encephalopathy.   He takes Protonix  for history of GERD and that works well to control symptoms.  He had a right upper quadrant ultrasound in July for [facility] screening which was negative.   He has been vaccinated to hep A and B and completed the series in 2019.    Prior workup: EGD 01/06/2018 - 2cm HH, no varices, portal hypertensive gastriits EGD 11/25/18 - large esophageal varices, one with red whale sign - banded x 6, severe portal hypertensive gastritis EGD 12/16/18 - grade II varices in the lower esophagus, procedure was aborted due to large volume of food in the stomach, no banding done   CT scan 12/11/2008 - cirrhotic appearing liver, ? Wall thickening of the ascending and proximal descending colon, small left inguinal and umbilical hernias   EGD 01/19/19 - Esophagogastric landmarks identified. - Grade II esophageal varix - overall improved since last exam. Banded x 3. - Mild portal hypertensive gastritis. - Normal  duodenal bulb and second portion of the duodenum.   Colonoscopy - 01/19/19 - One 8 mm polyp in the cecum, removed with a cold snare. Resected and retrieved. - Two 5 to 6 mm polyps in the transverse colon, removed with a cold snare. Resected and retrieved. - One 5 mm polyp in the descending colon, removed with a cold snare. Resected and retrieved. - One 5 mm polyp in the sigmoid colon, removed with a cold snare. Resected and retrieved. - Mulitple suspected rectal hyperplastic polyps - two representative lesions removed with a cold snare. Resected and retrieved. - One 8 mm polyp in the rectum, removed with a hot snare. Resected and retrieved. - Diverticulosis in the transverse colon and in the ascending colon. - Tortous colon - Edema throughout, consistent with change from portal hypertension, I suspect the cause of CT changes. - Internal hemorrhoids. - The examination was otherwise normal.   1. Colon, polyp(s), cecum, transverse - TUBULAR ADENOMA. - SESSILE SERRATED POLYP WITHOUT DYSPLASIA (X5 FRAGMENTS). - NO HIGH GRADE DYSPLASIA OR MALIGNANCY. 2. Colon, polyp(s), sigmoid, rectal, descending - HYPERPLASTIC POLYP (X5 FRAGMENTS). - NO DYSPLASIA OR MALIGNANCY.   EGD 04/28/19 - Esophagogastric landmarks identified. - Small esophageal varices that easily flattened with insufflation and stigmata of prior banding noted. No further banding performed today. - Normal stomach. - Normal duodenal bulb and second portion of the duodenum.   EGD 11/03/19 - Dr. Legrand. Grade I varices were found in the  lower third of the esophagus. No banding required. Portal hypertensive gastropathy was found in the entire examined stomach. A large amount of food (residue) was found in the entire examined stomach. This limited visualization. Retroflexion performed in stomach, but gastric cardia/fundus not well visualized as a result of retained Food. The examined duodenum was normal.     EGD 01/24/21 -  Trace  varices were found in the lower third of the esophagus. They were small in size. Not amenable to banding. There was a protuberance of benign appearing mucosa at the distal esophagus I suspect scarring from prior banding The exam of the esophagus was otherwise normal. Mild portal hypertensive gastropathy was found in the gastric fundus and in the gastric body. The exam of the stomach was otherwise normal. The duodenal bulb and second portion of the duodenum were normal.   Echo 04/10/21 - EF 60-65%    EGD 06/15/22: - Esophagogastric landmarks were identified: the Z-line was found at 43 cm, the gastroesophageal junction was found at 43 cm and the upper extent of the gastric folds was found at 43 cm from the incisors. Findings: - Small varices that flattened with insufflation were found in the lower third of the esophagus. No high risk stigmata for bleeding. - The exam of the esophagus was otherwise normal. - Portal hypertensive gastropathy was found in the entire examined stomach - mucosa was friable to lavage with water jet. - The exam of the stomach was otherwise normal. No gastric varices. - The examined duodenum was normal.    Repeat EGD in 1 year, also switched back to nadolol / coreg    Colonoscopy 06/15/22: - The perianal and digital rectal examinations were normal. - A 3 mm polyp was found in the cecum. The polyp was sessile. The polyp was removed with a cold snare. Resection and retrieval were complete. - A 3 mm polyp was found in the ascending colon. The polyp was sessile. The polyp was removed with a cold snare. Resection and retrieval were complete. - Multiple small-mouthed diverticula were found in the ascending colon and left colon. - Internal hemorrhoids were found during retroflexion. - The exam was otherwise without abnormality. Of note, the IC valve was normal. I thought a photo of it was taken, but it was not.   Diagnosis Surgical [P], colon, cecum, ascending, polyp (2) - TUBULAR  ADENOMA (1) WITHOUT HIGH GRADE DYSPLASIA. - COLONIC MUCOSA WITH BENIGN LYMPHOID AGGREGATE (1).     RUQ 06/10/23: IMPRESSION: 1. No acute abnormality identified. 2. Lobular contour of the liver with increased echotexture consistent with cirrhosis.     53 y.o. male here for assessment of the following   1. Alcoholic cirrhosis of liver without ascites (HCC)   2. Bleeding esophageal varices, unspecified esophageal varices type (HCC)     Has been off alcohol for the past 1-1/2 years since hospitalization for detox and doing really well in that regard.  No decompensation since have seen him.  He had a right upper quadrant ultrasound for HCC screening that is up-to-date.  He is overdue for routine blood work.  We will send to the lab for basic labs today.  Otherwise he is due for surveillance endoscopy to reassess his varices.  Hopefully on Coreg  risk for rebleeding is low.  I discussed risks and benefits of endoscopy and anesthesia, he wishes to proceed with this.  Otherwise continue Coreg , avoid all alcohol.  Follow-up in 6 months or sooner with any issues   PLAN: - lab today -  CBC, CMET, INR, AFP - EGD at the [facility] [place] for varices surveillance - RUQ US  to be done in January - recall - avoid all alcohol - continue Coreg  - f/u 6 months   EGD 06/05/24: - Esophagogastric landmarks were identified: the Z-line was found at 42 cm, the gastroesophageal junction was found at 42 cm and the upper extent of the gastric folds was found at 42 cm from the incisors. Findings: - The Z-line was slightly irregular but did not meet criteria for Barrett's. - Grade I varices were found in the lower third of the esophagus. They were small in size. No high risk stigmata for bleeding. - The exam of the esophagus was otherwise normal. - Mild portal hypertensive gastropathy was found in the gastric fundus and in the gastric body. - The exam of the stomach was otherwise normal. - The examined duodenum was normal.     Past  Medical History:  Diagnosis Date   Alcoholism (HCC)    Arthritis    Blood transfusion without reported diagnosis    jan, 2020 3 units PRBC   Cataract    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   Constipation    Resolved   COPD (chronic obstructive pulmonary disease) (HCC)    DDD (degenerative disc disease), cervical    Diverticulosis    ED (erectile dysfunction)    Esophageal varices (HCC)    Esophageal varices (HCC)    GERD (gastroesophageal reflux disease)    H/O: upper GI bleed    Hepatitis    Hepatitis C antibody positive in blood    resolved   High blood pressure    History of anemia    Internal hemorrhoids    Left inguinal hernia 2020   Lung nodule    Small   Portal hypertensive gastropathy (HCC)    Sleep apnea    trying to get used to his CPAP, not wearing CPAP per SO [name] McAdoo   Substance abuse (HCC)    Umbilical hernia 2020     Past Surgical History:  Procedure Laterality Date   arm surgery     left arm,   COLONOSCOPY WITH PROPOFOL  N/A 01/19/2019   Procedure: COLONOSCOPY WITH PROPOFOL ;  Surgeon: Leigh Elspeth SQUIBB, MD;  Location: [facility];  Service: Gastroenterology;  Laterality: N/A;   ESOPHAGEAL BANDING  11/25/2018   Procedure: ESOPHAGEAL BANDING;  Surgeon: Teressa Toribio SQUIBB, MD;  Location: [facility];  Service: Endoscopy;;   ESOPHAGEAL BANDING  01/19/2019   Procedure: ESOPHAGEAL BANDING;  Surgeon: Leigh Elspeth SQUIBB, MD;  Location: [facility];  Service: Gastroenterology;;   ESOPHAGEAL BANDING N/A 01/24/2021   Procedure: ESOPHAGEAL BANDING;  Surgeon: Leigh Elspeth SQUIBB, MD;  Location: [facility];  Service: Gastroenterology;  Laterality: N/A;   ESOPHAGOGASTRODUODENOSCOPY (EGD) WITH PROPOFOL  N/A 11/25/2018   Procedure: ESOPHAGOGASTRODUODENOSCOPY (EGD) WITH PROPOFOL ;  Surgeon: Teressa Toribio SQUIBB, MD;  Location: [facility];  Service: Endoscopy;  Laterality: N/A;   ESOPHAGOGASTRODUODENOSCOPY (EGD) WITH PROPOFOL  N/A 01/19/2019   Procedure:  ESOPHAGOGASTRODUODENOSCOPY (EGD) WITH PROPOFOL ;  Surgeon: Leigh Elspeth SQUIBB, MD;  Location: [facility];  Service: Gastroenterology;  Laterality: N/A;   ESOPHAGOGASTRODUODENOSCOPY (EGD) WITH PROPOFOL  N/A 04/28/2019   Procedure: ESOPHAGOGASTRODUODENOSCOPY (EGD) WITH PROPOFOL ;  Surgeon: Leigh Elspeth SQUIBB, MD;  Location: [facility];  Service: Gastroenterology;  Laterality: N/A;   ESOPHAGOGASTRODUODENOSCOPY (EGD) WITH PROPOFOL  N/A 11/03/2019   Procedure: ESOPHAGOGASTRODUODENOSCOPY (EGD) WITH PROPOFOL ;  Surgeon: Legrand Victory LITTIE DOUGLAS, MD;  Location: [facility];  Service: Gastroenterology;  Laterality: N/A;  ESOPHAGOGASTRODUODENOSCOPY (EGD) WITH PROPOFOL  N/A 01/24/2021   Procedure: ESOPHAGOGASTRODUODENOSCOPY (EGD) WITH PROPOFOL ;  Surgeon: Leigh Elspeth SQUIBB, MD;  Location: [facility];  Service: Gastroenterology;  Laterality: N/A;   KNEE SURGERY Left    POLYPECTOMY  01/19/2019   Procedure: POLYPECTOMY;  Surgeon: Leigh Elspeth SQUIBB, MD;  Location: [facility];  Service: Gastroenterology;;   TOTAL KNEE ARTHROPLASTY Left 01/27/2024   Procedure: LEFT TOTAL KNEE ARTHROPLASTY;  Surgeon: Jerri Kay HERO, MD;  Location: [facility];  Service: Orthopedics;  Laterality: Left;   Family History  Problem Relation Age of Onset   Colitis Mother    Arthritis Mother    Dementia Mother    Cancer Father        lymphoma?   Cancer Paternal Grandfather        bone   Heart disease Neg Hx    Stroke Neg Hx    Diabetes Neg Hx    Colon cancer Neg Hx    Colon polyps Neg Hx    Esophageal cancer Neg Hx    Rectal cancer Neg Hx    Stomach cancer Neg Hx    Social History   Tobacco Use   Smoking status: Every Day    Current packs/day: 0.50    Average packs/day: 0.5 packs/day for 30.0 years (15.0 ttl pk-yrs)    Types: Cigarettes    Passive exposure: Current   Smokeless tobacco: Former    Types: Engineer, drilling   Vaping status: Never Used  Substance Use Topics   Alcohol use: Not Currently    Alcohol/week: 36.0  standard drinks of alcohol    Types: 36 Cans of beer per week    Comment: quit March 2023   Drug use: Yes    Types: Marijuana    Comment: RN attempted to reach pt per protocol x2 on 7/10.  No call to reschedule PV made. Canceled procedure per protocol.   Current Outpatient Medications  Medication Sig Dispense Refill   alfuzosin  (UROXATRAL ) 10 MG 24 hr tablet Take 1 tablet (10 mg total) by mouth at bedtime. 90 tablet 3   BD DISP NEEDLE 23G X 1 MISC SMARTSIG:injection Once a Week     carvedilol  (COREG ) 6.25 MG tablet Take 1 tablet (6.25 mg total) by mouth 2 (two) times daily with a meal. 60 tablet 3   folic acid  (FOLVITE ) 1 MG tablet Take 1 mg by mouth in the morning.     loratadine  (CLARITIN ) 10 MG tablet Take 10 mg by mouth daily.     Magnesium  250 MG CAPS Take 250 mg by mouth daily.     methocarbamol  (ROBAXIN ) 750 MG tablet TAKE 1 TABLET(750 MG) BY MOUTH THREE TIMES DAILY AS NEEDED FOR MUSCLE SPASMS 30 tablet 1   MILK THISTLE PO Take 1 capsule by mouth 2 (two) times daily.     Multiple Vitamin (MULTIVITAMIN WITH MINERALS) TABS tablet Take 1 tablet by mouth daily with breakfast.     pantoprazole  (PROTONIX ) 40 MG tablet Take 1 tablet (40 mg total) by mouth daily. 90 tablet 0   sildenafil  (VIAGRA ) 100 MG tablet Take 0.5-1 tablets (50-100 mg total) by mouth daily as needed for erectile dysfunction. 1/2-1 tab po prn 10 tablet 5   testosterone  cypionate (DEPOTESTOSTERONE CYPIONATE) 200 MG/ML injection Inject 0.5 mLs (100 mg total) into the skin once a week. 10 mL 0   Thiamine  HCl (VITAMIN B1) 100 MG TABS Take 1 tablet (100 mg total) by mouth in the morning. 30 tablet 3  traZODone  (DESYREL ) 100 MG tablet Take 1 tablet (100 mg total) by mouth at bedtime. 90 tablet 1   venlafaxine  XR (EFFEXOR -XR) 75 MG 24 hr capsule Take 1 capsule (75 mg total) by mouth daily. 90 capsule 1   VENTOLIN  HFA 108 (90 Base) MCG/ACT inhaler INHALE 1 PUFF EVERY 6 (SIX) HOURS AS NEEDED INTO THE LUNGS FOR WHEEZING OR  SHORTNESS OF BREATH. 18 g 1   No current facility-administered medications for this visit.   Allergies  Allergen Reactions   Gabapentin Other (See Comments)    Restless legs   Onion Swelling    Raw onions    Naproxen Hives and Swelling     Review of Systems: All systems reviewed and negative except where noted in HPI.    No results found.  Physical Exam: There were no vitals taken for this visit. Constitutional: Pleasant,well-developed, ***male in no acute distress. HEENT: Normocephalic and atraumatic. Conjunctivae are normal. No scleral icterus. Neck supple.  Cardiovascular: Normal rate, regular rhythm.  Pulmonary/chest: Effort normal and breath sounds normal. No wheezing, rales or rhonchi. Abdominal: Soft, nondistended, nontender. Bowel sounds active throughout. There are no masses palpable. No hepatomegaly. Extremities: no edema Lymphadenopathy: No cervical adenopathy noted. Neurological: Alert and oriented to person place and time. Skin: Skin is warm and dry. No rashes noted. Psychiatric: Normal mood and affect. Behavior is normal.   ASSESSMENT: 53 y.o. male here for assessment of the following  No diagnosis found.  PLAN:   Bevely Doffing, FNP

## 2024-08-04 ENCOUNTER — Ambulatory Visit: Admitting: Nurse Practitioner

## 2024-08-05 ENCOUNTER — Other Ambulatory Visit: Payer: Self-pay

## 2024-08-05 DIAGNOSIS — N5201 Erectile dysfunction due to arterial insufficiency: Secondary | ICD-10-CM

## 2024-08-05 MED ORDER — SILDENAFIL CITRATE 100 MG PO TABS: 50.0000 mg | ORAL_TABLET | Freq: Every day | ORAL | 5 refills | Status: DC | PRN

## 2024-08-11 ENCOUNTER — Ambulatory Visit: Payer: Medicare HMO | Admitting: Urology

## 2024-08-11 ENCOUNTER — Other Ambulatory Visit: Payer: Self-pay

## 2024-08-11 DIAGNOSIS — K029 Dental caries, unspecified: Secondary | ICD-10-CM

## 2024-08-14 ENCOUNTER — Other Ambulatory Visit: Payer: Self-pay

## 2024-08-14 DIAGNOSIS — K219 Gastro-esophageal reflux disease without esophagitis: Secondary | ICD-10-CM

## 2024-08-31 ENCOUNTER — Other Ambulatory Visit: Payer: Self-pay | Admitting: Internal Medicine

## 2024-09-04 ENCOUNTER — Telehealth: Payer: Self-pay

## 2024-09-04 ENCOUNTER — Encounter: Payer: Self-pay | Admitting: Urology

## 2024-09-04 ENCOUNTER — Ambulatory Visit: Admitting: Urology

## 2024-09-04 VITALS — BP 136/77 | HR 54

## 2024-09-04 DIAGNOSIS — N401 Enlarged prostate with lower urinary tract symptoms: Secondary | ICD-10-CM | POA: Diagnosis not present

## 2024-09-04 DIAGNOSIS — N138 Other obstructive and reflux uropathy: Secondary | ICD-10-CM

## 2024-09-04 DIAGNOSIS — N5201 Erectile dysfunction due to arterial insufficiency: Secondary | ICD-10-CM

## 2024-09-04 DIAGNOSIS — R351 Nocturia: Secondary | ICD-10-CM

## 2024-09-04 LAB — URINALYSIS, ROUTINE W REFLEX MICROSCOPIC
Bilirubin, UA: NEGATIVE
Glucose, UA: NEGATIVE
Ketones, UA: NEGATIVE
Leukocytes,UA: NEGATIVE
Nitrite, UA: NEGATIVE
Protein,UA: NEGATIVE
RBC, UA: NEGATIVE
Specific Gravity, UA: 1.02 (ref 1.005–1.030)
Urobilinogen, Ur: 0.2 mg/dL (ref 0.2–1.0)
pH, UA: 6 (ref 5.0–7.5)

## 2024-09-04 MED ORDER — ALFUZOSIN HCL ER 10 MG PO TB24: 10.0000 mg | ORAL_TABLET | Freq: Two times a day (BID) | ORAL | 3 refills | Status: DC

## 2024-09-04 NOTE — Telephone Encounter (Signed)
 Will do a PA once initiated

## 2024-09-04 NOTE — Patient Instructions (Signed)

## 2024-09-04 NOTE — Telephone Encounter (Signed)
 Document in media received  Alfusozin- per pharmacy, drug not covered by plan. See see document for further information.

## 2024-09-04 NOTE — Progress Notes (Signed)
 09/04/2024 1:03 PM   Levi Alvarez [birth year] [id number]  Referring provider: Bevely Doffing, FNP [phone] ,  [place] [address]  Followup BPH   HPI: Mr Levi Alvarez is a 53yo here for followup for BPh with nocturia. IPSS 11 QOL 2 on uroxatral  10mg  daily. His nocturia has improved and his stream is stronger. He has post void dribbling. No straining to urinate.    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   Constipation    Resolved   COPD (chronic obstructive pulmonary disease) (HCC)    DDD (degenerative disc disease), cervical    Diverticulosis    ED (erectile dysfunction)    Esophageal varices (HCC)    Esophageal varices (HCC)    GERD (gastroesophageal reflux disease)    H/O: upper GI bleed    Hepatitis    Hepatitis C antibody positive in blood    resolved   High blood pressure    History of anemia    Internal hemorrhoids    Left inguinal hernia 2020   Lung nodule    Small   Portal hypertensive gastropathy (HCC)    Sleep apnea    trying to get used to his CPAP, not wearing CPAP per SO Levi Alvarez   Substance abuse (HCC)    Umbilical hernia 2020    Surgical History: Past Surgical History:  Procedure Laterality Date   arm surgery     left arm,   COLONOSCOPY WITH PROPOFOL  N/A 01/19/2019   Procedure: COLONOSCOPY WITH PROPOFOL ;  Surgeon: Leigh Elspeth SQUIBB, MD;  Location: [facility];  Service: Gastroenterology;  Laterality: N/A;   ESOPHAGEAL BANDING  11/25/2018   Procedure: ESOPHAGEAL BANDING;  Surgeon: Teressa Toribio SQUIBB, MD;  Location: [facility];  Service: Endoscopy;;   ESOPHAGEAL BANDING  01/19/2019   Procedure: ESOPHAGEAL BANDING;  Surgeon: Leigh Elspeth SQUIBB, MD;  Location: [facility];  Service: Gastroenterology;;   ESOPHAGEAL BANDING N/A 01/24/2021   Procedure:  ESOPHAGEAL BANDING;  Surgeon: Leigh Elspeth SQUIBB, MD;  Location: [facility];  Service: Gastroenterology;  Laterality: N/A;   ESOPHAGOGASTRODUODENOSCOPY (EGD) WITH PROPOFOL  N/A 11/25/2018   Procedure: ESOPHAGOGASTRODUODENOSCOPY (EGD) WITH PROPOFOL ;  Surgeon: Teressa Toribio SQUIBB, MD;  Location: [facility];  Service: Endoscopy;  Laterality: N/A;   ESOPHAGOGASTRODUODENOSCOPY (EGD) WITH PROPOFOL  N/A 01/19/2019   Procedure: ESOPHAGOGASTRODUODENOSCOPY (EGD) WITH PROPOFOL ;  Surgeon: Leigh Elspeth SQUIBB, MD;  Location: [facility];  Service: Gastroenterology;  Laterality: N/A;   ESOPHAGOGASTRODUODENOSCOPY (EGD) WITH PROPOFOL  N/A 04/28/2019   Procedure: ESOPHAGOGASTRODUODENOSCOPY (EGD) WITH PROPOFOL ;  Surgeon: Leigh Elspeth SQUIBB, MD;  Location: [facility];  Service: Gastroenterology;  Laterality: N/A;   ESOPHAGOGASTRODUODENOSCOPY (EGD) WITH PROPOFOL  N/A 11/03/2019   Procedure: ESOPHAGOGASTRODUODENOSCOPY (EGD) WITH PROPOFOL ;  Surgeon: Legrand Victory LITTIE DOUGLAS, MD;  Location: [facility];  Service: Gastroenterology;  Laterality: N/A;   ESOPHAGOGASTRODUODENOSCOPY (EGD) WITH PROPOFOL  N/A 01/24/2021   Procedure: ESOPHAGOGASTRODUODENOSCOPY (EGD) WITH PROPOFOL ;  Surgeon: Leigh Elspeth SQUIBB, MD;  Location: [facility];  Service: Gastroenterology;  Laterality: N/A;   KNEE SURGERY Left    POLYPECTOMY  01/19/2019   Procedure: POLYPECTOMY;  Surgeon: Leigh Elspeth SQUIBB, MD;  Location: [facility];  Service: Gastroenterology;;   TOTAL KNEE ARTHROPLASTY Left 01/27/2024   Procedure: LEFT TOTAL KNEE ARTHROPLASTY;  Surgeon: Jerri Kay HERO, MD;  Location: [facility];  Service: Orthopedics;  Laterality: Left;    Home Medications:  Allergies as of 09/04/2024       Reactions   Gabapentin Other (See Comments)   Restless legs   Onion Swelling   Raw onions    Naproxen Hives, Swelling        Medication List        Accurate as of September 04, 2024  1:03 PM. If you have any questions, ask your nurse or doctor.           alfuzosin  10 MG 24 hr tablet Commonly known as: UROXATRAL  Take 1 tablet (10 mg total) by mouth at bedtime.   BD Disp Needle 23G X 1 Misc Generic drug: NEEDLE (DISP) 23 G SMARTSIG:injection Once a Week   carvedilol  6.25 MG tablet Commonly known as: COREG  Take 1 tablet (6.25 mg total) by mouth 2 (two) times daily with a meal.   folic acid  1 MG tablet Commonly known as: FOLVITE  Take 1 mg by mouth in the morning.   loratadine  10 MG tablet Commonly known as: CLARITIN  Take 10 mg by mouth daily.   Magnesium  250 MG Caps Take 250 mg by mouth daily.   methocarbamol  750 MG tablet Commonly known as: ROBAXIN  TAKE 1 TABLET(750 MG) BY MOUTH THREE TIMES DAILY AS NEEDED FOR MUSCLE SPASMS   MILK THISTLE PO Take 1 capsule by mouth 2 (two) times daily.   multivitamin with minerals Tabs tablet Take 1 tablet by mouth daily with breakfast.   pantoprazole  40 MG tablet Commonly known as: PROTONIX  TAKE 1 TABLET(40 MG) BY MOUTH DAILY   sildenafil  100 MG tablet Commonly known as: VIAGRA  Take 0.5-1 tablets (50-100 mg total) by mouth daily as needed for erectile dysfunction. 1/2-1 tab po prn   testosterone  cypionate 200 MG/ML injection Commonly known as: DEPOTESTOSTERONE CYPIONATE INJECT 0.5ML(100MG ) IN THE MUSCLE ONCE A WEEK   thiamine  100 MG tablet Commonly known as: VITAMIN B1 Take 1 tablet (100 mg total) by mouth in the morning.   traZODone  100 MG tablet Commonly known as: DESYREL  Take 1 tablet (100 mg total) by mouth at bedtime.   venlafaxine  XR 75 MG 24 hr capsule Commonly known as: EFFEXOR -XR Take 1 capsule (75 mg total) by mouth daily.   Ventolin  HFA 108 (90 Base) MCG/ACT inhaler Generic drug: albuterol  INHALE 1 PUFF EVERY 6 (SIX) HOURS AS NEEDED INTO THE LUNGS FOR WHEEZING OR SHORTNESS OF BREATH.        Allergies:  Allergies  Allergen Reactions   Gabapentin Other (See Comments)    Restless legs   Onion Swelling    Raw onions    Naproxen Hives and Swelling     Family History: Family History  Problem Relation Age of Onset   Colitis Mother    Arthritis Mother    Dementia Mother    Cancer Father        lymphoma?   Cancer Paternal Grandfather        bone   Heart disease Neg Hx    Stroke Neg Hx    Diabetes Neg Hx    Colon cancer Neg Hx    Colon polyps Neg Hx    Esophageal cancer Neg Hx    Rectal cancer Neg Hx    Stomach cancer Neg Hx     Social History:  reports that he has been smoking cigarettes. He has a 15 pack-year smoking history. He has been exposed to tobacco smoke. He has quit using smokeless tobacco.  His smokeless tobacco use included chew. He reports that he does not currently use alcohol after a past usage of about 36.0 standard drinks of alcohol per week. He reports current drug use. Drug: Marijuana.  ROS: All other review of systems were reviewed and are negative except what is noted above in HPI  Physical Exam: BP 136/77   Pulse (!) 54   Constitutional:  Alert and oriented, No acute distress. HEENT: [place] AT, moist mucus membranes.  Trachea midline, no masses. Cardiovascular: No clubbing, cyanosis, or edema. Respiratory: Normal respiratory effort, no increased work of breathing. GI: Abdomen is soft, nontender, nondistended, no abdominal masses GU: No CVA tenderness.  Lymph: No cervical or inguinal lymphadenopathy. Skin: No rashes, bruises or suspicious lesions. Neurologic: Grossly intact, no focal deficits, moving all 4 extremities. Psychiatric: Normal mood and affect.  Laboratory Data: Lab Results  Component Value Date   WBC 7.6 06/18/2024   HGB 13.2 06/18/2024   HCT 41.4 06/18/2024   MCV 78.9 06/18/2024   PLT 137.0 (L) 06/18/2024    Lab Results  Component Value Date   CREATININE 0.86 06/18/2024    No results found for: PSA  Lab Results  Component Value Date   TESTOSTERONE  339 05/01/2024    Lab Results  Component Value Date   HGBA1C 5.9 (H) 05/01/2024    Urinalysis    Component Value  Date/Time   COLORURINE YELLOW 01/31/2024 2223   APPEARANCEUR CLEAR 01/31/2024 2223   APPEARANCEUR Clear 06/24/2023 1509   LABSPEC <1.005 (L) 01/31/2024 2223   PHURINE 6.5 01/31/2024 2223   GLUCOSEU NEGATIVE 01/31/2024 2223   HGBUR NEGATIVE 01/31/2024 2223   BILIRUBINUR NEGATIVE 01/31/2024 2223   BILIRUBINUR Negative 06/24/2023 1509   KETONESUR NEGATIVE 01/31/2024 2223   PROTEINUR NEGATIVE 01/31/2024 2223   NITRITE NEGATIVE 01/31/2024 2223   LEUKOCYTESUR NEGATIVE 01/31/2024 2223    Lab Results  Component Value Date   LABMICR Comment 06/24/2023   WBCUA 0-5 07/22/2018   RBCUA 0-2 07/22/2018   LABEPIT 0-10 07/22/2018   MUCUS Present 07/22/2018   BACTERIA Few 07/22/2018    Pertinent Imaging:  No results found for this or any previous visit.  No results found for this or any previous visit.  No results found for this or any previous visit.  No results found for this or any previous visit.  No results found for this or any previous visit.  No results found for this or any previous visit.  No results found for this or any previous visit.  No results found for this or any previous visit.   Assessment & Plan:    1. 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 Nocturia Increase uroxatral  to 10mg  BID - Urinalysis, Routine w reflex microscopic     No follow-ups on file.  Belvie Clara, MD  [facility] [place]

## 2024-09-07 ENCOUNTER — Telehealth: Payer: Self-pay

## 2024-09-07 NOTE — Telephone Encounter (Signed)
 Medication prior authorization request received.  Completed PA request through cover my meds for drug alfuzosin . KEY: [id number]  Approved: Pending

## 2024-09-08 ENCOUNTER — Ambulatory Visit: Admitting: Physician Assistant

## 2024-09-14 ENCOUNTER — Ambulatory Visit: Payer: Self-pay

## 2024-09-14 ENCOUNTER — Telehealth: Payer: Self-pay

## 2024-09-14 ENCOUNTER — Encounter: Payer: Self-pay | Admitting: Radiology

## 2024-09-14 NOTE — Telephone Encounter (Signed)
 Called [facility], spoke with Mitzie, regarding scheduling with Brain Donah Mitzie reports will send message to provider to get referral and will call patient back to get him scheduled.

## 2024-09-14 NOTE — Telephone Encounter (Signed)
 FYI Only or Action Required?: Action required by provider: request for appointment, referral request, and clinical question for provider.  Patient was last seen in primary care on 04/21/2024 by Bevely Doffing, FNP.  Called Nurse Triage reporting Alcohol Problem and Depression.  Symptoms began several weeks ago.  Interventions attempted: Nothing.  Symptoms are: unchanged.  Triage Disposition: See Physician Within 24 Hours  Patient/caregiver understands and will follow disposition?:    Copied from CRM #[id number]. Topic: Clinical - Red Word Triage >> Sep 14, 2024  3:12 PM Levi Alvarez wrote: Red Word that prompted transfer to Nurse Triage: Depression, Alcohol abuse.   Would like an appt with the counselor, Levi Alvarez Reason for Disposition  [1] Depression AND [2] getting worse (e.g., sleeping poorly, less able to do activities of daily living)  Answer Assessment - Initial Assessment Questions Requesting appt with Brain Levi, declined appt with pcp. Called [facility] regarding request and referral.  Advised [facility]/ ED if symptoms worsen.  Pt's significant other reports already aware of [facility] and helpline;declined resources.  Pt's significant other reports: 1. CONCERN: What happened that made you call today?     He said he's tired and wants to the therapy 2. DEPRESSION SYMPTOM SCREENING: How are you feeling overall? (e.g., decreased energy, increased sleeping or difficulty sleeping, difficulty concentrating, feelings of sadness, guilt, hopelessness, or worthlessness)     Best friend died few years ago, have regrets. 3. RISK OF HARM - SUICIDAL IDEATION:  Do you ever have thoughts of hurting or killing yourself?  (e.g., yes, no, no but preoccupation with thoughts about death)     no 4. RISK OF HARM - HOMICIDAL IDEATION:  Do you ever have thoughts of hurting or killing someone else?  (e.g., yes, no, no but preoccupation with thoughts about death)     no 5. FUNCTIONAL IMPAIRMENT: How  have things been going for you overall? Have you had more difficulty than usual doing your normal daily activities?  (e.g., better, same, worse; self-care, school, work, interactions)     Not taking care of himself because he's drinking 6. SUPPORT: Who is with you now? Who do you live with? Do you have family or friends who you can talk to?      Significant other 7. THERAPIST: Do you have a counselor or therapist? If Yes, ask: What is their name?     no 8. STRESSORS: Has there been any new stress or recent changes in your life?     Recently just asking for help, to help deal with it 9. ALCOHOL USE OR SUBSTANCE USE (DRUG USE): Do you drink alcohol or use any illegal drugs?     Reports abusing alcohol 10. OTHER: Do you have any other physical symptoms right now? (e.g., fever)       denies Velaxafine taking as prescribed  Protocols used: Depression-A-AH

## 2024-09-14 NOTE — Telephone Encounter (Signed)
 Patient asking can he be seen by brian franco in our office.

## 2024-09-15 ENCOUNTER — Other Ambulatory Visit: Payer: Self-pay

## 2024-09-15 ENCOUNTER — Ambulatory Visit: Admitting: Physician Assistant

## 2024-09-15 DIAGNOSIS — F3342 Major depressive disorder, recurrent, in full remission: Secondary | ICD-10-CM

## 2024-09-15 NOTE — Telephone Encounter (Signed)
 Referral placed for [facility], Levi Alvarez

## 2024-09-16 ENCOUNTER — Encounter: Payer: Self-pay | Admitting: Orthopaedic Surgery

## 2024-09-17 ENCOUNTER — Encounter: Payer: Self-pay | Admitting: Internal Medicine

## 2024-09-17 ENCOUNTER — Other Ambulatory Visit: Payer: Self-pay | Admitting: Physician Assistant

## 2024-09-17 MED ORDER — TIZANIDINE HCL 4 MG PO TABS: 4.0000 mg | ORAL_TABLET | Freq: Two times a day (BID) | ORAL | 0 refills | Status: DC | PRN

## 2024-09-18 ENCOUNTER — Other Ambulatory Visit: Payer: Self-pay

## 2024-09-18 DIAGNOSIS — F10930 Alcohol use, unspecified with withdrawal, uncomplicated: Secondary | ICD-10-CM

## 2024-09-18 MED ORDER — NALTREXONE HCL 50 MG PO TABS: 50.0000 mg | ORAL_TABLET | Freq: Every day | ORAL | 1 refills | Status: DC

## 2024-09-25 ENCOUNTER — Ambulatory Visit (INDEPENDENT_AMBULATORY_CARE_PROVIDER_SITE_OTHER): Admitting: Internal Medicine

## 2024-09-25 ENCOUNTER — Encounter: Payer: Self-pay | Admitting: Internal Medicine

## 2024-09-25 ENCOUNTER — Ambulatory Visit: Payer: Self-pay | Admitting: Professional Counselor

## 2024-09-25 VITALS — BP 116/74 | HR 77 | Ht 70.0 in | Wt 226.2 lb

## 2024-09-25 DIAGNOSIS — K7030 Alcoholic cirrhosis of liver without ascites: Secondary | ICD-10-CM | POA: Diagnosis not present

## 2024-09-25 DIAGNOSIS — G4700 Insomnia, unspecified: Secondary | ICD-10-CM | POA: Diagnosis not present

## 2024-09-25 DIAGNOSIS — F1029 Alcohol dependence with unspecified alcohol-induced disorder: Secondary | ICD-10-CM

## 2024-09-25 DIAGNOSIS — F3342 Major depressive disorder, recurrent, in full remission: Secondary | ICD-10-CM

## 2024-09-25 DIAGNOSIS — E039 Hypothyroidism, unspecified: Secondary | ICD-10-CM

## 2024-09-25 DIAGNOSIS — I10 Essential (primary) hypertension: Secondary | ICD-10-CM | POA: Diagnosis not present

## 2024-09-25 MED ORDER — HYDROXYZINE PAMOATE 25 MG PO CAPS: 25.0000 mg | ORAL_CAPSULE | Freq: Every evening | ORAL | 1 refills | Status: DC | PRN

## 2024-09-25 NOTE — Patient Instructions (Addendum)
 Please take Hydroxyzine  as needed for persistent insomnia.  Please continue to take other medications as prescribed.  Please continue to follow low salt diet and perform moderate exercise/walking as tolerated.  Please maintain simple sleep hygiene. - Maintain dark and non-noisy environment in the bedroom. - Please use the bedroom for sleep and sexual activity only. - Do not use electronic devices in the bedroom. - Please take dinner at least 2 hours before bedtime. - Please avoid caffeinated products in the evening, including coffee, soft drinks. - Please try to maintain the regular sleep-wake cycle - Go to bed and wake up at the same time.
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
## 2024-09-25 NOTE — Progress Notes (Signed)
 Acute Office Visit  Subjective:    Patient ID: Levi Alvarez, male    DOB: [birth year], 53 y.o.   MRN: [id number]  Chief Complaint  Patient presents with   Follow-up    Recovering from alcohol use , would ike blood work to check liver. Would like to discuss sleep medication    HPI Patient is in today for evaluation of liver enzymes.  He had been abstinent from alcohol for the last 2 years, but had alcoholic drinks for few days about 2 weeks ago.  He decided to stop alcohol later, and has been taking naltrexone  now.  He has not had alcohol drink for the last 1 week.  He has history of liver cirrhosis, and requests liver function test today.  Denies any episode of nausea, vomiting or diarrhea currently.  Of note, his blood tests from 06/25 showed elevated TSH and low free T4. He has chronic fatigue, but denies any recent weight or appetite change, tremors or palpitations.  Insomnia: He currently takes trazodone  100 mg nightly for insomnia.  He still reports difficulty initiating sleep on some days.  He tries to go to bed at 9 PM, but usually falls asleep around 1 AM and stays asleep till 8-9 AM.  Has chronic anxiety, which is likely due to alcohol use history as well.  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   Constipation    Resolved   COPD (chronic obstructive pulmonary disease) (HCC)    DDD (degenerative disc disease), cervical    Diverticulosis    ED (erectile dysfunction)    Esophageal varices (HCC)    Esophageal varices (HCC)    GERD (gastroesophageal reflux disease)    H/O: upper GI bleed    Hepatitis    Hepatitis C antibody positive in blood    resolved   High blood pressure    History of anemia    Internal hemorrhoids    Left inguinal hernia 2020   Lung nodule    Small   Portal hypertensive  gastropathy (HCC)    Sleep apnea    trying to get used to his CPAP, not wearing CPAP per SO [name] McAdoo   Substance abuse (HCC)    Umbilical hernia 2020    Past Surgical History:  Procedure Laterality Date   arm surgery     left arm,   COLONOSCOPY WITH PROPOFOL  N/A 01/19/2019   Procedure: COLONOSCOPY WITH PROPOFOL ;  Surgeon: Leigh Elspeth SQUIBB, MD;  Location: [facility];  Service: Gastroenterology;  Laterality: N/A;   ESOPHAGEAL BANDING  11/25/2018   Procedure: ESOPHAGEAL BANDING;  Surgeon: Teressa Toribio SQUIBB, MD;  Location: [facility];  Service: Endoscopy;;   ESOPHAGEAL BANDING  01/19/2019   Procedure: ESOPHAGEAL BANDING;  Surgeon: Leigh Elspeth SQUIBB, MD;  Location: [facility];  Service: Gastroenterology;;   ESOPHAGEAL BANDING N/A 01/24/2021   Procedure: ESOPHAGEAL BANDING;  Surgeon: Leigh Elspeth SQUIBB, MD;  Location: [facility];  Service: Gastroenterology;  Laterality: N/A;   ESOPHAGOGASTRODUODENOSCOPY (EGD) WITH PROPOFOL  N/A 11/25/2018   Procedure: ESOPHAGOGASTRODUODENOSCOPY (EGD) WITH PROPOFOL ;  Surgeon: Teressa Toribio SQUIBB, MD;  Location: [facility];  Service: Endoscopy;  Laterality: N/A;   ESOPHAGOGASTRODUODENOSCOPY (EGD) WITH PROPOFOL  N/A 01/19/2019   Procedure: ESOPHAGOGASTRODUODENOSCOPY (EGD) WITH PROPOFOL ;  Surgeon: Leigh Elspeth SQUIBB, MD;  Location: [facility];  Service: Gastroenterology;  Laterality: N/A;   ESOPHAGOGASTRODUODENOSCOPY (EGD) WITH PROPOFOL  N/A 04/28/2019   Procedure: ESOPHAGOGASTRODUODENOSCOPY (EGD) WITH PROPOFOL ;  Surgeon: Leigh Elspeth SQUIBB, MD;  Location: [facility];  Service: Gastroenterology;  Laterality: N/A;   ESOPHAGOGASTRODUODENOSCOPY (EGD) WITH PROPOFOL  N/A 11/03/2019   Procedure: ESOPHAGOGASTRODUODENOSCOPY (EGD) WITH PROPOFOL ;  Surgeon: Legrand Victory LITTIE DOUGLAS, MD;  Location: [facility];  Service: Gastroenterology;  Laterality: N/A;   ESOPHAGOGASTRODUODENOSCOPY (EGD) WITH PROPOFOL  N/A 01/24/2021   Procedure: ESOPHAGOGASTRODUODENOSCOPY (EGD) WITH  PROPOFOL ;  Surgeon: Leigh Elspeth SQUIBB, MD;  Location: [facility];  Service: Gastroenterology;  Laterality: N/A;   KNEE SURGERY Left    POLYPECTOMY  01/19/2019   Procedure: POLYPECTOMY;  Surgeon: Leigh Elspeth SQUIBB, MD;  Location: [facility];  Service: Gastroenterology;;   TOTAL KNEE ARTHROPLASTY Left 01/27/2024   Procedure: LEFT TOTAL KNEE ARTHROPLASTY;  Surgeon: Jerri Kay HERO, MD;  Location: [facility];  Service: Orthopedics;  Laterality: Left;    Family History  Problem Relation Age of Onset   Colitis Mother    Arthritis Mother    Dementia Mother    Cancer Father        lymphoma?   Cancer Paternal Grandfather        bone   Heart disease Neg Hx    Stroke Neg Hx    Diabetes Neg Hx    Colon cancer Neg Hx    Colon polyps Neg Hx    Esophageal cancer Neg Hx    Rectal cancer Neg Hx    Stomach cancer Neg Hx     Social History   Socioeconomic History   Marital status: Divorced    Spouse name: Not on file   Number of children: 2   Years of education: Not on file   Highest education level: GED or equivalent  Occupational History   Occupation: disabled  Tobacco Use   Smoking status: Every Day    Current packs/day: 0.50    Average packs/day: 0.5 packs/day for 30.0 years (15.0 ttl pk-yrs)    Types: Cigarettes    Passive exposure: Current   Smokeless tobacco: Former    Types: Engineer, Drilling   Vaping status: Never Used  Substance and Sexual Activity   Alcohol use: Not Currently    Alcohol/week: 36.0 standard drinks of alcohol    Types: 36 Cans of beer per week    Comment: quit March 2023   Drug use: Yes    Types: Marijuana    Comment: RN attempted to reach pt per protocol x2 on 7/10.  No call to reschedule PV made. Canceled procedure per protocol.   Sexual activity: Yes    Partners: Female  Other Topics Concern   Not on file  Social History Narrative   Not on file   Social Drivers of Health   Financial Resource Strain: Low Risk  (09/23/2024)   Overall Financial  Resource Strain (CARDIA)    Difficulty of Paying Living Expenses: Not very hard  Food Insecurity: No Food Insecurity (09/23/2024)   Hunger Vital Sign    Worried About Running Out of Food in the Last Year: Never true    Ran Out of Food in the Last Year: Never true  Transportation Needs: No Transportation Needs (09/23/2024)   PRAPARE - Administrator, Civil Service (Medical): No    Lack of Transportation (Non-Medical): No  Physical Activity: Insufficiently Active (09/23/2024)   Exercise Vital Sign    Days of Exercise per Week: 4 days  Minutes of Exercise per Session: 30 min  Stress: Stress Concern Present (09/23/2024)   [employer] of Occupational Health - Occupational Stress Questionnaire    Feeling of Stress: Rather much  Social Connections: Moderately Isolated (09/23/2024)   Social Connection and Isolation Panel    Frequency of Communication with Friends and Family: More than three times a week    Frequency of Social Gatherings with Friends and Family: More than three times a week    Attends Religious Services: Never    Database Administrator or Organizations: No    Attends Engineer, Structural: Not on file    Marital Status: Living with partner  Intimate Partner Violence: Not on file    Outpatient Medications Prior to Visit  Medication Sig Dispense Refill   alfuzosin  (UROXATRAL ) 10 MG 24 hr tablet Take 1 tablet (10 mg total) by mouth 2 (two) times daily. 180 tablet 3   BD DISP NEEDLE 23G X 1 MISC SMARTSIG:injection Once a Week     carvedilol  (COREG ) 6.25 MG tablet Take 1 tablet (6.25 mg total) by mouth 2 (two) times daily with a meal. 60 tablet 3   folic acid  (FOLVITE ) 1 MG tablet Take 1 mg by mouth in the morning.     loratadine  (CLARITIN ) 10 MG tablet Take 10 mg by mouth daily.     Magnesium  250 MG CAPS Take 250 mg by mouth daily.     MILK THISTLE PO Take 1 capsule by mouth 2 (two) times daily.     Multiple Vitamin (MULTIVITAMIN WITH MINERALS)  TABS tablet Take 1 tablet by mouth daily with breakfast.     naltrexone  (DEPADE) 50 MG tablet Take 1 tablet (50 mg total) by mouth daily. 30 tablet 1   pantoprazole  (PROTONIX ) 40 MG tablet TAKE 1 TABLET(40 MG) BY MOUTH DAILY 90 tablet 0   sildenafil  (VIAGRA ) 100 MG tablet Take 0.5-1 tablets (50-100 mg total) by mouth daily as needed for erectile dysfunction. 1/2-1 tab po prn 30 tablet 5   testosterone  cypionate (DEPOTESTOSTERONE CYPIONATE) 200 MG/ML injection INJECT 0.5ML(100MG ) IN THE MUSCLE ONCE A WEEK 10 mL 0   Thiamine  HCl (VITAMIN B1) 100 MG TABS Take 1 tablet (100 mg total) by mouth in the morning. 30 tablet 3   tiZANidine  (ZANAFLEX ) 4 MG tablet Take 1 tablet (4 mg total) by mouth 2 (two) times daily as needed for muscle spasms. 20 tablet 0   traZODone  (DESYREL ) 100 MG tablet Take 1 tablet (100 mg total) by mouth at bedtime. 90 tablet 1   venlafaxine  XR (EFFEXOR -XR) 75 MG 24 hr capsule Take 1 capsule (75 mg total) by mouth daily. 90 capsule 1   VENTOLIN  HFA 108 (90 Base) MCG/ACT inhaler INHALE 1 PUFF EVERY 6 (SIX) HOURS AS NEEDED INTO THE LUNGS FOR WHEEZING OR SHORTNESS OF BREATH. 18 g 1   methocarbamol  (ROBAXIN ) 750 MG tablet TAKE 1 TABLET(750 MG) BY MOUTH THREE TIMES DAILY AS NEEDED FOR MUSCLE SPASMS 30 tablet 1   No facility-administered medications prior to visit.    Allergies  Allergen Reactions   Gabapentin Other (See Comments)    Restless legs   Onion Swelling    Raw onions    Naproxen Hives and Swelling    Review of Systems  Constitutional:  Negative for chills and fever.  HENT:  Negative for congestion and sore throat.   Eyes:  Negative for pain and discharge.  Respiratory:  Negative for cough and shortness of breath.   Cardiovascular:  Negative  for chest pain and palpitations.  Gastrointestinal:  Negative for diarrhea, nausea and vomiting.  Endocrine: Negative for polydipsia and polyuria.  Genitourinary:  Negative for dysuria and hematuria.  Musculoskeletal:  Negative  for neck pain and neck stiffness.  Skin:  Negative for rash.  Neurological:  Negative for dizziness and weakness.  Psychiatric/Behavioral:  Positive for sleep disturbance. Negative for agitation and behavioral problems. The patient is nervous/anxious.        Objective:    Physical Exam Vitals reviewed.  Constitutional:      General: He is not in acute distress.    Appearance: He is not diaphoretic.  HENT:     Head: Normocephalic and atraumatic.     Nose: Nose normal.     Mouth/Throat:     Mouth: Mucous membranes are moist.  Eyes:     General: No scleral icterus.    Extraocular Movements: Extraocular movements intact.  Cardiovascular:     Rate and Rhythm: Normal rate and regular rhythm.     Heart sounds: Normal heart sounds. No murmur heard. Pulmonary:     Breath sounds: Normal breath sounds. No wheezing or rales.  Musculoskeletal:     Cervical back: Neck supple. No tenderness.     Right lower leg: No edema.     Left lower leg: No edema.  Skin:    General: Skin is warm.     Findings: No rash.  Neurological:     General: No focal deficit present.     Mental Status: He is alert and oriented to person, place, and time.  Psychiatric:        Mood and Affect: Mood is anxious.        Behavior: Behavior is cooperative.     BP 116/74   Pulse 77   Ht 5' 10 (1.778 m)   Wt 226 lb 3.2 oz (102.6 kg)   SpO2 98%   BMI 32.46 kg/m  Wt Readings from Last 3 Encounters:  09/25/24 226 lb 3.2 oz (102.6 kg)  06/05/24 205 lb (93 kg)  05/22/24 205 lb (93 kg)        Assessment & Plan:   Problem List Items Addressed This Visit       Cardiovascular and Mediastinum   Essential hypertension   BP Readings from Last 1 Encounters:  09/25/24 116/74   Well-controlled with Coreg  Counseled for compliance with the medications Advised DASH diet and moderate exercise/walking, at least 150 mins/week      Relevant Orders   CBC with Differential/Platelet (Completed)   CMP14+EGFR  (Completed)     Digestive   Cirrhosis with alcoholism (HCC) - Primary   Appears euvolemic currently Strongly advised to remain abstinent from alcohol Check CMP Followed by [facility]      Relevant Orders   CBC with Differential/Platelet (Completed)   CMP14+EGFR (Completed)     Endocrine   Hypothyroidism   Had elevated TSH and low free T4 in 06/25 Currently asymptomatic Will recheck TSH and free T4      Relevant Orders   TSH + free T4 (Completed)     Other   Insomnia   Sleep hygiene material provided Continue trazodone  100 mg nightly for now Added hydroxyzine  as needed for persistent anxiety Would avoid BZD or Ambien due to his history of alcohol abuse      Relevant Medications   hydrOXYzine  (VISTARIL ) 25 MG capsule   Alcohol dependence with unspecified alcohol-induced disorder (HCC)   Last drink about a week ago Has  had alcohol withdrawal symptoms in the past Currently on naltrexone  Strongly advised to remain abstinent from alcohol use      Relevant Orders   CBC with Differential/Platelet (Completed)   CMP14+EGFR (Completed)     Meds ordered this encounter  Medications   hydrOXYzine  (VISTARIL ) 25 MG capsule    Sig: Take 1 capsule (25 mg total) by mouth at bedtime as needed for anxiety (Insomnia).    Dispense:  30 capsule    Refill:  1     Abbigale Mcelhaney MARLA Blanch, MD

## 2024-09-26 ENCOUNTER — Ambulatory Visit: Payer: Self-pay | Admitting: Internal Medicine

## 2024-09-26 DIAGNOSIS — E039 Hypothyroidism, unspecified: Secondary | ICD-10-CM | POA: Insufficient documentation

## 2024-09-26 LAB — CBC WITH DIFFERENTIAL/PLATELET
Basophils Absolute: 0 x10E3/uL (ref 0.0–0.2)
Basos: 1 %
EOS (ABSOLUTE): 0.2 x10E3/uL (ref 0.0–0.4)
Eos: 3 %
Hematocrit: 40.8 % (ref 37.5–51.0)
Hemoglobin: 13 g/dL (ref 13.0–17.7)
Immature Grans (Abs): 0 x10E3/uL (ref 0.0–0.1)
Immature Granulocytes: 0 %
Lymphocytes Absolute: 1.9 x10E3/uL (ref 0.7–3.1)
Lymphs: 32 %
MCH: 27 pg (ref 26.6–33.0)
MCHC: 31.9 g/dL (ref 31.5–35.7)
MCV: 85 fL (ref 79–97)
Monocytes Absolute: 0.8 x10E3/uL (ref 0.1–0.9)
Monocytes: 13 %
Neutrophils Absolute: 3.1 x10E3/uL (ref 1.4–7.0)
Neutrophils: 51 %
Platelets: 136 x10E3/uL — ABNORMAL LOW (ref 150–450)
RBC: 4.81 x10E6/uL (ref 4.14–5.80)
RDW: 17.8 % — ABNORMAL HIGH (ref 11.6–15.4)
WBC: 6 x10E3/uL (ref 3.4–10.8)

## 2024-09-26 LAB — CMP14+EGFR
ALT: 32 IU/L (ref 0–44)
AST: 34 IU/L (ref 0–40)
Albumin: 4.2 g/dL (ref 3.8–4.9)
Alkaline Phosphatase: 114 IU/L (ref 47–123)
BUN/Creatinine Ratio: 14 (ref 9–20)
BUN: 13 mg/dL (ref 6–24)
Bilirubin Total: 0.3 mg/dL (ref 0.0–1.2)
CO2: 24 mmol/L (ref 20–29)
Calcium: 9.1 mg/dL (ref 8.7–10.2)
Chloride: 100 mmol/L (ref 96–106)
Creatinine, Ser: 0.9 mg/dL (ref 0.76–1.27)
Globulin, Total: 2.9 g/dL (ref 1.5–4.5)
Glucose: 102 mg/dL — ABNORMAL HIGH (ref 70–99)
Potassium: 4.4 mmol/L (ref 3.5–5.2)
Sodium: 137 mmol/L (ref 134–144)
Total Protein: 7.1 g/dL (ref 6.0–8.5)
eGFR: 103 mL/min/1.73 (ref >59)

## 2024-09-26 LAB — TSH+FREE T4
Free T4: 0.87 ng/dL (ref 0.82–1.77)
TSH: 1.99 u[IU]/mL (ref 0.450–4.500)

## 2024-09-26 NOTE — Assessment & Plan Note (Signed)
 Appears euvolemic currently Strongly advised to remain abstinent from alcohol Check CMP Followed by [facility]

## 2024-09-26 NOTE — Assessment & Plan Note (Signed)
 Had elevated TSH and low free T4 in 06/25 Currently asymptomatic Will recheck TSH and free T4

## 2024-09-26 NOTE — Assessment & Plan Note (Signed)
 Last drink about a week ago Has had alcohol withdrawal symptoms in the past Currently on naltrexone  Strongly advised to remain abstinent from alcohol use

## 2024-09-26 NOTE — Assessment & Plan Note (Signed)
 BP Readings from Last 1 Encounters:  09/25/24 116/74   Well-controlled with Coreg  Counseled for compliance with the medications Advised DASH diet and moderate exercise/walking, at least 150 mins/week

## 2024-09-26 NOTE — Assessment & Plan Note (Signed)
 Sleep hygiene material provided Continue trazodone  100 mg nightly for now Added hydroxyzine  as needed for persistent anxiety Would avoid BZD or Ambien due to his history of alcohol abuse

## 2024-10-02 ENCOUNTER — Telehealth: Payer: Self-pay | Admitting: Professional Counselor

## 2024-10-02 ENCOUNTER — Ambulatory Visit: Payer: Self-pay | Admitting: Professional Counselor

## 2024-10-02 DIAGNOSIS — F3342 Major depressive disorder, recurrent, in full remission: Secondary | ICD-10-CM

## 2024-10-02 NOTE — BH Specialist Note (Signed)
 Virtual Behavioral Health Treatment Plan Team Note  MRN: [id number] NAME: Levi Alvarez  DATE: 10/02/24  Start time: Start Time: 1105 End time: Stop Time: 1110 Total time: Total Time in Minutes (Visit): 5  Total number of Virtual BH Treatment Team Plan encounters: 1/4  Treatment Team Attendees: Redell Corn and Carter Becker  Collaborative Care Psychiatric Consultant Case Review   Assessment/Provisional Diagnosis 53 year old male with history of multiple medical issues including cirrhosis, alcohol dependency, etc. The patient is referred for anxiety, depression, and alcohol dependency.   Provisional Diagnosis: # MDD, Recurrent, moderate   #GAD   Recommendation 1. Continue hydroxyzine  25 daily PRN, naltrexone , and Trazodone  100 mg at bedtime. 2. Recommend Effexor  increase from 75 mg daily to 150 mg daily, 3. Recommend substance abuse IOP. 4. BH specialist to follow up.   Thank you for your consult. Please contact our collaborative care team for any questions or concerns.   I spent 20 minutes chart reviewing, discussing with [facility] Speicalist and documenting in the chart.  Diagnoses:    ICD-10-CM   1. Recurrent major depressive disorder, in full remission  F33.42       Goals, Interventions and Follow-up Plan Goals: Increase healthy adjustment to current life circumstances Interventions: Behavioral Activation CBT Cognitive Behavioral Therapy Medication Monitoring Medication Management Recommendations: NA Follow-up Plan: refer to substance abuse specific services  History of the present illness Presenting Problem/Current Symptoms:  The patient is a 53 year old man presenting for a collaborative care assessment after being referred for symptoms of depression and anxiety primarily related to significant and longstanding substance use. He reports being nine days sober, following multiple recent trips to the ICU connected to his alcohol use. He shares a long history of emotional  distress beginning at age 53 when his wife left him. After the separation, he turned heavily to drinking, and his parents assumed care of his children. Over the years, his substance use escalated, especially following the deaths of both his father and his best friend, losses that he continues to carry with deep regret. He is currently on disability. There is no documented psychiatric history, and he reports never having engaged with a psychiatrist or therapist in the past. He is currently prescribed Effexor . Socially, he acknowledges limited support and describes efforts to understand his triggers and maintain his sobriety.     he patient presents with depressive symptoms, and anxiety related to chronic alcohol use. His recent medical instability and repeated ICU visits indicate a high level of clinical risk and a high likelihood of relapse without structured intervention. While he was referred to collaborative care for mood symptoms, his level of substance use severity exceeds what can be safely or effectively managed within the collaborative care model alone. A psychiatric consultation will be completed to gather further diagnostic clarification and medication recommendations; however, the priority at this time is connecting him with appropriate substance use treatment services. He requires a higher level of care, ideally involving structured substance abuse services or an intensive outpatient or residential program that can address detoxification, relapse prevention, coping skills, and the emotional drivers of his use.   The plan is to complete the psychiatric consultation, schedule a follow-up session, and provide targeted recommendations for a more appropriate treatment level to support stabilization, reduce relapse risk, and address both mental health and substance-related needs.  Screenings PHQ-9 Assessments:     09/25/2024    1:13 PM 04/21/2024    3:06 PM 04/13/2024    3:01 PM  Depression screen  PHQ 2/9  Decreased Interest 2 0 0  Down, Depressed, Hopeless 1 0 0  PHQ - 2 Score 3 0 0  Altered sleeping 2 0 2  Tired, decreased energy 1 0 0  Change in appetite 1 0 0  Feeling bad or failure about yourself  2 0 0  Trouble concentrating 1 0 0  Moving slowly or fidgety/restless 2 0 0  Suicidal thoughts 0 0 0  PHQ-9 Score 12 0  2   Difficult doing work/chores Somewhat difficult Not difficult at all Not difficult at all     Data saved with a previous flowsheet row definition   GAD-7 Assessments:     09/25/2024    1:14 PM 04/21/2024    3:06 PM 04/13/2024    3:01 PM 12/04/2023    2:06 PM  GAD 7 : Generalized Anxiety Score  Nervous, Anxious, on Edge 3 0 0 0  Control/stop worrying 3 0 0 0  Worry too much - different things 3 0 0 0  Trouble relaxing 3 0 0 0  Restless 2 0 0 0  Easily annoyed or irritable 1 0 0 0  Afraid - awful might happen 0 0 0 0  Total GAD 7 Score 15 0 0 0  Anxiety Difficulty Somewhat difficult Not difficult at all Not difficult at all Not difficult at all    Past Medical History 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   Constipation    Resolved   COPD (chronic obstructive pulmonary disease) (HCC)    DDD (degenerative disc disease), cervical    Diverticulosis    ED (erectile dysfunction)    Esophageal varices (HCC)    Esophageal varices (HCC)    GERD (gastroesophageal reflux disease)    H/O: upper GI bleed    Hepatitis    Hepatitis C antibody positive in blood    resolved   High blood pressure    History of anemia    Internal hemorrhoids    Left inguinal hernia 2020   Lung nodule    Small   Portal hypertensive gastropathy (HCC)    Sleep apnea    trying to get used to his CPAP, not wearing CPAP per SO [name] McAdoo   Substance abuse (HCC)    Umbilical hernia 2020    Vital  signs: There were no vitals filed for this visit.  Allergies:  Allergies as of 10/02/2024 - Review Complete 09/25/2024  Allergen Reaction Noted   Gabapentin Other (See Comments) 03/17/2021   Onion Swelling 08/13/2021   Naproxen Hives and Swelling 03/03/2015    Medication History Current medications:  Outpatient Encounter Medications as of 10/02/2024  Medication Sig   alfuzosin  (UROXATRAL ) 10 MG 24 hr tablet Take 1 tablet (10 mg total) by mouth 2 (two) times daily.   BD DISP NEEDLE 23G X 1 MISC SMARTSIG:injection Once a Week   carvedilol  (COREG ) 6.25 MG tablet Take 1 tablet (6.25 mg total) by mouth 2 (two) times daily with a meal.   folic acid  (FOLVITE ) 1 MG tablet Take 1 mg by mouth in the morning.   hydrOXYzine  (VISTARIL ) 25 MG capsule Take 1 capsule (25 mg total) by mouth at bedtime as needed for anxiety (Insomnia).   loratadine  (CLARITIN ) 10 MG tablet  Take 10 mg by mouth daily.   Magnesium  250 MG CAPS Take 250 mg by mouth daily.   MILK THISTLE PO Take 1 capsule by mouth 2 (two) times daily.   Multiple Vitamin (MULTIVITAMIN WITH MINERALS) TABS tablet Take 1 tablet by mouth daily with breakfast.   naltrexone  (DEPADE) 50 MG tablet Take 1 tablet (50 mg total) by mouth daily.   pantoprazole  (PROTONIX ) 40 MG tablet TAKE 1 TABLET(40 MG) BY MOUTH DAILY   sildenafil  (VIAGRA ) 100 MG tablet Take 0.5-1 tablets (50-100 mg total) by mouth daily as needed for erectile dysfunction. 1/2-1 tab po prn   testosterone  cypionate (DEPOTESTOSTERONE CYPIONATE) 200 MG/ML injection INJECT 0.5ML(100MG ) IN THE MUSCLE ONCE A WEEK   Thiamine  HCl (VITAMIN B1) 100 MG TABS Take 1 tablet (100 mg total) by mouth in the morning.   tiZANidine  (ZANAFLEX ) 4 MG tablet Take 1 tablet (4 mg total) by mouth 2 (two) times daily as needed for muscle spasms.   traZODone  (DESYREL ) 100 MG tablet Take 1 tablet (100 mg total) by mouth at bedtime.   venlafaxine  XR (EFFEXOR -XR) 75 MG 24 hr capsule Take 1 capsule (75 mg total) by mouth  daily.   VENTOLIN  HFA 108 (90 Base) MCG/ACT inhaler INHALE 1 PUFF EVERY 6 (SIX) HOURS AS NEEDED INTO THE LUNGS FOR WHEEZING OR SHORTNESS OF BREATH.   No facility-administered encounter medications on file as of 10/02/2024.     Scribe for Treatment Team: Redell JINNY Corn

## 2024-10-02 NOTE — Patient Instructions (Signed)
 If your symptoms worsen or you have thoughts of suicide/homicide, PLEASE SEEK IMMEDIATE MEDICAL ATTENTION.  You may always call:   National Suicide Hotline: 988 or 539 667 3577 [place] Crisis Line: 458 569 7915 Crisis Recovery in [place]: [phone]     These are available 24 hours a day, 7 days a week.

## 2024-10-05 ENCOUNTER — Encounter: Payer: Self-pay | Admitting: Internal Medicine

## 2024-10-05 ENCOUNTER — Other Ambulatory Visit: Payer: Self-pay

## 2024-10-05 DIAGNOSIS — F17218 Nicotine dependence, cigarettes, with other nicotine-induced disorders: Secondary | ICD-10-CM

## 2024-10-05 MED ORDER — ALBUTEROL SULFATE HFA 108 (90 BASE) MCG/ACT IN AERS: 1.0000 | INHALATION_SPRAY | RESPIRATORY_TRACT | 1 refills | Status: DC | PRN

## 2024-10-13 ENCOUNTER — Ambulatory Visit: Admitting: Internal Medicine

## 2024-10-25 ENCOUNTER — Encounter: Payer: Self-pay | Admitting: Internal Medicine

## 2024-10-26 ENCOUNTER — Other Ambulatory Visit: Payer: Self-pay

## 2024-10-26 MED ORDER — CARVEDILOL 6.25 MG PO TABS: 6.2500 mg | ORAL_TABLET | Freq: Two times a day (BID) | ORAL | 0 refills | Status: DC

## 2024-10-26 NOTE — Telephone Encounter (Signed)
 Sent to pharmacy

## 2024-11-03 ENCOUNTER — Ambulatory Visit

## 2024-11-04 ENCOUNTER — Ambulatory Visit: Admitting: Internal Medicine

## 2024-11-06 ENCOUNTER — Telehealth: Payer: Self-pay

## 2024-11-06 ENCOUNTER — Ambulatory Visit: Admitting: Gastroenterology

## 2024-11-06 ENCOUNTER — Encounter: Payer: Self-pay | Admitting: Gastroenterology

## 2024-11-06 VITALS — BP 130/70 | HR 88 | Ht 70.0 in | Wt 216.4 lb

## 2024-11-06 DIAGNOSIS — F1090 Alcohol use, unspecified, uncomplicated: Secondary | ICD-10-CM

## 2024-11-06 DIAGNOSIS — I8501 Esophageal varices with bleeding: Secondary | ICD-10-CM | POA: Diagnosis not present

## 2024-11-06 DIAGNOSIS — K7030 Alcoholic cirrhosis of liver without ascites: Secondary | ICD-10-CM

## 2024-11-06 NOTE — Telephone Encounter (Signed)
 Spoke with girlfriend and she said I can go ahead and schedule for February any date will work and send information though clinical cytogeneticist. No need to call back. information sent

## 2024-11-06 NOTE — Progress Notes (Signed)
 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  Recall he was previously treated for hep C with undetectable viral load.  He has a history of bleeding esophageal varices in January 22 when he relapsed with alcohol, he underwent banding at that time and has had surveillance EGDs and on Coreg  since then.  On prior visits in recent years he had relapsed with significant alcohol use while dealing with life stressors and taking care of his mother who had dementia.  He was admitted to the hospital for alcohol detox and experienced withdrawal symptoms and was in the ICU for few days. This occurred a few years ago.   SINCE LAST VISIT  He is here for a follow-up visit today.  He states in general he has been doing really well.  Denies any decompensations since have last seen him-no jaundice, no recurrent bleeding, no encephalopathy, no ascites.  He endorses compliance with his Coreg , states he does not miss any doses.  He states he had a few alcoholic beverages on Christmas Eve, side of that states he really does not drink at all.  As above, he has struggled with sobriety in the past however is pretty adamant he has been doing pretty well lately with it aside from Christmas Eve.  He continues Protonix  for history of GERD, works to control his symptoms.  He last had an EGD with me in July.  No Barrett's esophagus.  Varices are small in size without any high risk stigmata.  Consideration for repeat EGD in 1 year.  He otherwise had a right upper quadrant ultrasound in August as well as labs, no hepatomas.  He denies any cardiopulmonary symptoms.  Otherwise feels well without complaints.    He has been vaccinated to hep A and B and completed the series in 2019.    Prior workup: EGD 01/06/2018 - 2cm HH, no varices, portal hypertensive gastriits EGD 11/25/18 - large esophageal varices, one with red whale sign - banded x 6, severe portal  hypertensive gastritis EGD 12/16/18 - grade II varices in the lower esophagus, procedure was aborted due to large volume of food in the stomach, no banding done   CT scan 12/11/2008 - cirrhotic appearing liver, ? Wall thickening of the ascending and proximal descending colon, small left inguinal and umbilical hernias   EGD 01/19/19 - Esophagogastric landmarks identified. - Grade II esophageal varix - overall improved since last exam. Banded x 3. - Mild portal hypertensive gastritis. - Normal duodenal bulb and second portion of the duodenum.   Colonoscopy - 01/19/19 - One 8 mm polyp in the cecum, removed with a cold snare. Resected and retrieved. - Two 5 to 6 mm polyps in the transverse colon, removed with a cold snare. Resected and retrieved. - One 5 mm polyp in the descending colon, removed with a cold snare. Resected and retrieved. - One 5 mm polyp in the sigmoid colon, removed with a cold snare. Resected and retrieved. - Mulitple suspected rectal hyperplastic polyps - two representative lesions removed with a cold snare. Resected and retrieved. - One 8 mm polyp in the rectum, removed with a hot snare. Resected and retrieved. - Diverticulosis in the transverse colon and in the ascending colon. - Tortous colon - Edema throughout, consistent with change from portal hypertension, I suspect the cause of CT changes. - Internal hemorrhoids. - The examination was otherwise normal.   1. Colon, polyp(s),  cecum, transverse - TUBULAR ADENOMA. - SESSILE SERRATED POLYP WITHOUT DYSPLASIA (X5 FRAGMENTS). - NO HIGH GRADE DYSPLASIA OR MALIGNANCY. 2. Colon, polyp(s), sigmoid, rectal, descending - HYPERPLASTIC POLYP (X5 FRAGMENTS). - NO DYSPLASIA OR MALIGNANCY.   EGD 04/28/19 - Esophagogastric landmarks identified. - Small esophageal varices that easily flattened with insufflation and stigmata of prior banding noted. No further banding performed today. - Normal stomach. - Normal duodenal bulb and  second portion of the duodenum.   EGD 11/03/19 - Dr. Legrand. Grade I varices were found in the lower third of the esophagus. No banding required. Portal hypertensive gastropathy was found in the entire examined stomach. A large amount of food (residue) was found in the entire examined stomach. This limited visualization. Retroflexion performed in stomach, but gastric cardia/fundus not well visualized as a result of retained Food. The examined duodenum was normal.     EGD 01/24/21 -  Trace varices were found in the lower third of the esophagus. They were small in size. Not amenable to banding. There was a protuberance of benign appearing mucosa at the distal esophagus I suspect scarring from prior banding The exam of the esophagus was otherwise normal. Mild portal hypertensive gastropathy was found in the gastric fundus and in the gastric body. The exam of the stomach was otherwise normal. The duodenal bulb and second portion of the duodenum were normal.   Echo 04/10/21 - EF 60-65%    EGD 06/15/22: - Esophagogastric landmarks were identified: the Z-line was found at 43 cm, the gastroesophageal junction was found at 43 cm and the upper extent of the gastric folds was found at 43 cm from the incisors. Findings: - Small varices that flattened with insufflation were found in the lower third of the esophagus. No high risk stigmata for bleeding. - The exam of the esophagus was otherwise normal. - Portal hypertensive gastropathy was found in the entire examined stomach - mucosa was friable to lavage with water jet. - The exam of the stomach was otherwise normal. No gastric varices. - The examined duodenum was normal.    Repeat EGD in 1 year, also switched back to nadolol / coreg    Colonoscopy 06/15/22: - The perianal and digital rectal examinations were normal. - A 3 mm polyp was found in the cecum. The polyp was sessile. The polyp was removed with a cold snare. Resection and retrieval were complete. - A 3  mm polyp was found in the ascending colon. The polyp was sessile. The polyp was removed with a cold snare. Resection and retrieval were complete. - Multiple small-mouthed diverticula were found in the ascending colon and left colon. - Internal hemorrhoids were found during retroflexion. - The exam was otherwise without abnormality. Of note, the IC valve was normal. I thought a photo of it was taken, but it was not.   Surgical [P], colon, cecum, ascending, polyp (2) - TUBULAR ADENOMA (1) WITHOUT HIGH GRADE DYSPLASIA. - COLONIC MUCOSA WITH BENIGN LYMPHOID AGGREGATE (1).     RUQ 06/10/23: IMPRESSION: 1. No acute abnormality identified. 2. Lobular contour of the liver with increased echotexture consistent with cirrhosis.   EGD 06/05/24: - Esophagogastric landmarks were identified: the Z-line was found at 42 cm, the gastroesophageal junction was found at 42 cm and the upper extent of the gastric folds was found at 42 cm from the incisors. Findings: - The Z-line was slightly irregular but did not meet criteria for Barrett's. - Grade I varices were found in the lower third of the esophagus. They  were small in size. No high risk stigmata for bleeding. - The exam of the esophagus was otherwise normal. - Mild portal hypertensive gastropathy was found in the gastric fundus and in the gastric body. - The exam of the stomach was otherwise normal. - The examined duodenum was normal.  RUQ US  06/18/24: IMPRESSION: 1. No acute abnormality identified. 2. Cirrhotic liver. No focal liver lesion identified.  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   Constipation    Resolved   COPD (chronic obstructive pulmonary disease) (HCC)    DDD (degenerative disc disease), cervical    Diverticulosis    ED (erectile dysfunction)    Esophageal varices  (HCC)    Esophageal varices (HCC)    GERD (gastroesophageal reflux disease)    H/O: upper GI bleed    Hepatitis    Hepatitis C antibody positive in blood    resolved   High blood pressure    History of anemia    Internal hemorrhoids    Left inguinal hernia 2020   Lung nodule    Small   Portal hypertensive gastropathy (HCC)    Sleep apnea    trying to get used to his CPAP, not wearing CPAP per SO [name] McAdoo   Substance abuse (HCC)    Umbilical hernia 2020     Past Surgical History:  Procedure Laterality Date   arm surgery     left arm,   COLONOSCOPY WITH PROPOFOL  N/A 01/19/2019   Procedure: COLONOSCOPY WITH PROPOFOL ;  Surgeon: Leigh Elspeth SQUIBB, MD;  Location: [facility];  Service: Gastroenterology;  Laterality: N/A;   ESOPHAGEAL BANDING  11/25/2018   Procedure: ESOPHAGEAL BANDING;  Surgeon: Teressa Toribio SQUIBB, MD;  Location: [facility];  Service: Endoscopy;;   ESOPHAGEAL BANDING  01/19/2019   Procedure: ESOPHAGEAL BANDING;  Surgeon: Leigh Elspeth SQUIBB, MD;  Location: [facility];  Service: Gastroenterology;;   ESOPHAGEAL BANDING N/A 01/24/2021   Procedure: ESOPHAGEAL BANDING;  Surgeon: Leigh Elspeth SQUIBB, MD;  Location: [facility];  Service: Gastroenterology;  Laterality: N/A;   ESOPHAGOGASTRODUODENOSCOPY (EGD) WITH PROPOFOL  N/A 11/25/2018   Procedure: ESOPHAGOGASTRODUODENOSCOPY (EGD) WITH PROPOFOL ;  Surgeon: Teressa Toribio SQUIBB, MD;  Location: [facility];  Service: Endoscopy;  Laterality: N/A;   ESOPHAGOGASTRODUODENOSCOPY (EGD) WITH PROPOFOL  N/A 01/19/2019   Procedure: ESOPHAGOGASTRODUODENOSCOPY (EGD) WITH PROPOFOL ;  Surgeon: Leigh Elspeth SQUIBB, MD;  Location: [facility];  Service: Gastroenterology;  Laterality: N/A;   ESOPHAGOGASTRODUODENOSCOPY (EGD) WITH PROPOFOL  N/A 04/28/2019   Procedure: ESOPHAGOGASTRODUODENOSCOPY (EGD) WITH PROPOFOL ;  Surgeon: Leigh Elspeth SQUIBB, MD;  Location: [facility];  Service: Gastroenterology;  Laterality: N/A;    ESOPHAGOGASTRODUODENOSCOPY (EGD) WITH PROPOFOL  N/A 11/03/2019   Procedure: ESOPHAGOGASTRODUODENOSCOPY (EGD) WITH PROPOFOL ;  Surgeon: Legrand Victory LITTIE DOUGLAS, MD;  Location: [facility];  Service: Gastroenterology;  Laterality: N/A;   ESOPHAGOGASTRODUODENOSCOPY (EGD) WITH PROPOFOL  N/A 01/24/2021   Procedure: ESOPHAGOGASTRODUODENOSCOPY (EGD) WITH PROPOFOL ;  Surgeon: Leigh Elspeth SQUIBB, MD;  Location: [facility];  Service: Gastroenterology;  Laterality: N/A;   KNEE SURGERY Left    POLYPECTOMY  01/19/2019   Procedure: POLYPECTOMY;  Surgeon: Leigh Elspeth SQUIBB, MD;  Location: [facility];  Service: Gastroenterology;;   TOTAL KNEE ARTHROPLASTY Left 01/27/2024   Procedure: LEFT TOTAL KNEE ARTHROPLASTY;  Surgeon: Jerri Kay HERO, MD;  Location: [facility];  Service: Orthopedics;  Laterality: Left;   Family History  Problem Relation Age of Onset   Colitis Mother    Arthritis Mother    Dementia Mother    Cancer Father        lymphoma?   Cancer Paternal Grandfather        bone   Heart disease Neg Hx    Stroke Neg Hx    Diabetes Neg Hx    Colon cancer Neg Hx    Colon polyps Neg Hx    Esophageal cancer Neg Hx    Rectal cancer Neg Hx    Stomach cancer Neg Hx    Social History[1] Current Outpatient Medications  Medication Sig Dispense Refill   albuterol  (VENTOLIN  HFA) 108 (90 Base) MCG/ACT inhaler Inhale 1-2 puffs into the lungs every 4 (four) hours as needed for wheezing or shortness of breath. 18 g 1   alfuzosin  (UROXATRAL ) 10 MG 24 hr tablet Take 1 tablet (10 mg total) by mouth 2 (two) times daily. 180 tablet 3   BD DISP NEEDLE 23G X 1 MISC SMARTSIG:injection Once a Week     carvedilol  (COREG ) 6.25 MG tablet Take 1 tablet (6.25 mg total) by mouth 2 (two) times daily with a meal. 180 tablet 0   chlorhexidine  (PERIDEX ) 0.12 % solution      folic acid  (FOLVITE ) 1 MG tablet Take 1 mg by mouth in the morning.     hydrOXYzine  (VISTARIL ) 25 MG capsule Take 1 capsule (25 mg total) by mouth at bedtime as  needed for anxiety (Insomnia). 30 capsule 1   loratadine  (CLARITIN ) 10 MG tablet Take 10 mg by mouth daily.     Magnesium  250 MG CAPS Take 250 mg by mouth daily.     MILK THISTLE PO Take 1 capsule by mouth 2 (two) times daily.     Multiple Vitamin (MULTIVITAMIN WITH MINERALS) TABS tablet Take 1 tablet by mouth daily with breakfast.     naltrexone  (DEPADE) 50 MG tablet Take 1 tablet (50 mg total) by mouth daily. 30 tablet 1   pantoprazole  (PROTONIX ) 40 MG tablet TAKE 1 TABLET(40 MG) BY MOUTH DAILY 90 tablet 0   sildenafil  (VIAGRA ) 100 MG tablet Take 0.5-1 tablets (50-100 mg total) by mouth daily as needed for erectile dysfunction. 1/2-1 tab po prn 30 tablet 5   tamsulosin  (FLOMAX ) 0.4 MG CAPS capsule take 1 capsule by oral route  every day 1/2 hour following the same meal each day     testosterone  cypionate (DEPOTESTOSTERONE CYPIONATE) 200 MG/ML injection INJECT 0.5ML(100MG ) IN THE MUSCLE ONCE A WEEK 10 mL 0   Thiamine  HCl (VITAMIN B1) 100 MG TABS Take 1 tablet (100 mg total) by mouth in the morning. 30 tablet 3   venlafaxine  XR (EFFEXOR -XR) 75 MG 24 hr capsule Take 1 capsule (75 mg total) by mouth daily. 90 capsule 1   No current facility-administered medications for this visit.   Allergies[2]   Review of Systems: All systems reviewed and negative except where noted in HPI.   Lab Results  Component Value Date   WBC 6.0 09/25/2024   HGB 13.0 09/25/2024   HCT 40.8 09/25/2024   MCV 85 09/25/2024   PLT 136 (L) 09/25/2024    Lab Results  Component Value Date   NA 137 09/25/2024   CL 100 09/25/2024   K 4.4 09/25/2024   CO2 24 09/25/2024   BUN 13 09/25/2024   CREATININE 0.90 09/25/2024   EGFR 103 09/25/2024   CALCIUM  9.1 09/25/2024   PHOS  3.5 01/26/2022   ALBUMIN 4.2 09/25/2024   GLUCOSE 102 (H) 09/25/2024    Lab Results  Component Value Date   ALT 32 09/25/2024   AST 34 09/25/2024   ALKPHOS 114 09/25/2024   BILITOT 0.3 09/25/2024   Lab Results  Component Value Date    INR 1.2 (H) 06/18/2024   INR 1.1 (H) 08/09/2023   INR 1.1 01/23/2022    MELD 3.0: 8 at 06/18/2024 11:13 AM MELD-Na: 8 at 06/18/2024 11:13 AM Calculated from: Serum Creatinine: 0.86 mg/dL (Using min of 1 mg/dL) at 11/13/7972 88:86 AM Serum Sodium: 141 mEq/L (Using max of 137 mEq/L) at 06/18/2024 11:13 AM Total Bilirubin: 0.6 mg/dL (Using min of 1 mg/dL) at 11/13/7972 88:86 AM Serum Albumin: 4.3 g/dL (Using max of 3.5 g/dL) at 11/13/7972 88:86 AM INR(ratio): 1.2 ratio at 06/18/2024 11:13 AM Age at listing (hypothetical): 53 years Sex: Male at 06/18/2024 11:13 AM    Physical Exam: BP 130/70   Pulse 88   Ht 5' 10 (1.778 m)   Wt 216 lb 6 oz (98.1 kg)   BMI 31.05 kg/m  Constitutional: Pleasant,well-developed, male in no acute distress. Neurological: Alert and oriented to person place and time. Psychiatric: Normal mood and affect. Behavior is normal.   ASSESSMENT: 53 y.o. male here for assessment of the following  1. Alcoholic cirrhosis of liver without ascites (HCC)   2. Bleeding esophageal varices, unspecified esophageal varices type (HCC)    MELD 8.  History of bleeding varices remotely but last EGD showed small varices this past summer, on Coreg  for which he is compliant.  Discussed cirrhosis in general, risks for decompensation and HCC over time.  Alcohol abstinence is critical.  He does endorse a few drinks recently but other than that has maintained sobriety.  Again advised complete abstinence if possible.  His liver function appears stable at this time.  He is due for ultrasound and labs in February.  Consideration for endoscopy next summer.  Continue Protonix  for reflux.  Follow-up in 6 months if not sooner with issues   PLAN: - abstain all all alcohol - recall for RUQ US  and labs in February - continue Coreg . Consideration for repeat EGD next summer - continue protonix  - fu 6 months  Marcey Naval, MD [facility]     [1]  Social History Tobacco Use    Smoking status: Every Day    Current packs/day: 0.50    Average packs/day: 0.5 packs/day for 30.0 years (15.0 ttl pk-yrs)    Types: Cigarettes    Passive exposure: Current   Smokeless tobacco: Former    Types: Engineer, Drilling   Vaping status: Never Used  Substance Use Topics   Alcohol use: Not Currently    Alcohol/week: 36.0 standard drinks of alcohol    Types: 36 Cans of beer per week    Comment: quit March 2023   Drug use: Yes    Types: Marijuana    Comment: RN attempted to reach pt per protocol x2 on 7/10.  No call to reschedule PV made. Canceled procedure per protocol.  [2]  Allergies Allergen Reactions   Gabapentin Other (See Comments)    Restless legs   Onion Swelling    Raw onions    Naproxen Hives and Swelling   "

## 2024-11-06 NOTE — Patient Instructions (Signed)
 _______________________________________________________  If your blood pressure at your visit was 140/90 or greater, please contact your primary care physician to follow up on this.  _______________________________________________________  If you are age 52 or older, your body mass index should be between 23-30. Your Body mass index is 31.05 kg/m. If this is out of the aforementioned range listed, please consider follow up with your Primary Care Provider.  If you are age 38 or younger, your body mass index should be between 19-25. Your Body mass index is 31.05 kg/m. If this is out of the aformentioned range listed, please consider follow up with your Primary Care Provider.   ________________________________________________________  The [facility] providers would like to encourage you to use MYCHART to communicate with providers for non-urgent requests or questions.  Due to long hold times on the telephone, sending your provider a message by [facility] may be a faster and more efficient way to get a response.  Please allow 48 business hours for a response.  Please remember that this is for non-urgent requests.  _______________________________________________________  [facility] is using a team-based approach to care.  Your team is made up of your doctor and two to three APPS. Our APPS (Nurse Practitioners and Physician Assistants) work with your physician to ensure care continuity for you. They are fully qualified to address your health concerns and develop a treatment plan. They communicate directly with your gastroenterologist to care for you. Seeing the Advanced Practice Practitioners on your physician's team can help you by facilitating care more promptly, often allowing for earlier appointments, access to diagnostic testing, procedures, and other specialty referrals.    Your provider has requested that you go to the basement level for lab work on 12-21-24. Press B on the elevator.  The lab is located at the first door on the left as you exit the elevator. Open 730am to 530pm  You have been scheduled for an abdominal ultrasound at [facility] (1st floor of hospital) on 12-21-24 at 9am. Please arrive 15 minutes prior to your appointment for registration. Make certain not to have anything to eat or drink midnight prior to your appointment. Should you need to reschedule your appointment, please contact radiology at [phone]. This test typically takes about 30 minutes to perform.  It was a pleasure to see you today!  Thank you for trusting me with your gastrointestinal care!

## 2024-11-09 NOTE — Telephone Encounter (Signed)
 Mailbox is full for mobile cell

## 2024-11-13 ENCOUNTER — Other Ambulatory Visit: Payer: Self-pay

## 2024-11-16 ENCOUNTER — Encounter: Payer: Self-pay | Admitting: Internal Medicine

## 2024-11-16 ENCOUNTER — Other Ambulatory Visit: Payer: Self-pay

## 2024-11-16 DIAGNOSIS — K219 Gastro-esophageal reflux disease without esophagitis: Secondary | ICD-10-CM

## 2024-11-16 DIAGNOSIS — N5201 Erectile dysfunction due to arterial insufficiency: Secondary | ICD-10-CM

## 2024-11-16 DIAGNOSIS — G4700 Insomnia, unspecified: Secondary | ICD-10-CM

## 2024-11-16 DIAGNOSIS — I10 Essential (primary) hypertension: Secondary | ICD-10-CM

## 2024-11-16 DIAGNOSIS — E291 Testicular hypofunction: Secondary | ICD-10-CM

## 2024-11-16 DIAGNOSIS — F3342 Major depressive disorder, recurrent, in full remission: Secondary | ICD-10-CM

## 2024-11-16 MED ORDER — CARVEDILOL 6.25 MG PO TABS: 6.2500 mg | ORAL_TABLET | Freq: Two times a day (BID) | ORAL | 3 refills | Status: AC

## 2024-11-16 MED ORDER — HYDROXYZINE PAMOATE 25 MG PO CAPS: 25.0000 mg | ORAL_CAPSULE | Freq: Every evening | ORAL | 3 refills | Status: AC | PRN

## 2024-11-16 MED ORDER — PANTOPRAZOLE SODIUM 40 MG PO TBEC: 40.0000 mg | DELAYED_RELEASE_TABLET | Freq: Every day | ORAL | 3 refills | Status: AC

## 2024-11-16 MED ORDER — VENLAFAXINE HCL ER 75 MG PO CP24: 75.0000 mg | ORAL_CAPSULE | Freq: Every day | ORAL | 3 refills | Status: AC

## 2024-11-16 MED ORDER — SILDENAFIL CITRATE 100 MG PO TABS: 50.0000 mg | ORAL_TABLET | Freq: Every day | ORAL | 5 refills | Status: DC | PRN

## 2024-11-16 MED ORDER — TESTOSTERONE CYPIONATE 200 MG/ML IM SOLN: 100.0000 mg | INTRAMUSCULAR | 0 refills | Status: AC

## 2024-11-16 MED ORDER — SILDENAFIL CITRATE 100 MG PO TABS: 50.0000 mg | ORAL_TABLET | Freq: Every day | ORAL | 3 refills | Status: AC | PRN

## 2024-11-17 ENCOUNTER — Telehealth: Payer: Self-pay

## 2024-11-17 ENCOUNTER — Ambulatory Visit (INDEPENDENT_AMBULATORY_CARE_PROVIDER_SITE_OTHER): Payer: Medicare (Managed Care) | Admitting: Internal Medicine

## 2024-11-17 ENCOUNTER — Encounter: Payer: Self-pay | Admitting: Internal Medicine

## 2024-11-17 VITALS — BP 104/72 | HR 77 | Ht 70.0 in | Wt 215.4 lb

## 2024-11-17 DIAGNOSIS — E291 Testicular hypofunction: Secondary | ICD-10-CM | POA: Diagnosis not present

## 2024-11-17 DIAGNOSIS — K7030 Alcoholic cirrhosis of liver without ascites: Secondary | ICD-10-CM | POA: Diagnosis not present

## 2024-11-17 DIAGNOSIS — E039 Hypothyroidism, unspecified: Secondary | ICD-10-CM

## 2024-11-17 DIAGNOSIS — F5104 Psychophysiologic insomnia: Secondary | ICD-10-CM | POA: Diagnosis not present

## 2024-11-17 DIAGNOSIS — B079 Viral wart, unspecified: Secondary | ICD-10-CM | POA: Insufficient documentation

## 2024-11-17 DIAGNOSIS — F1029 Alcohol dependence with unspecified alcohol-induced disorder: Secondary | ICD-10-CM | POA: Diagnosis not present

## 2024-11-17 DIAGNOSIS — I10 Essential (primary) hypertension: Secondary | ICD-10-CM

## 2024-11-17 DIAGNOSIS — Z0120 Encounter for dental examination and cleaning without abnormal findings: Secondary | ICD-10-CM | POA: Diagnosis not present

## 2024-11-17 DIAGNOSIS — N401 Enlarged prostate with lower urinary tract symptoms: Secondary | ICD-10-CM

## 2024-11-17 MED ORDER — ALFUZOSIN HCL ER 10 MG PO TB24: 10.0000 mg | ORAL_TABLET | Freq: Two times a day (BID) | ORAL | 0 refills | Status: AC

## 2024-11-17 MED ORDER — NALTREXONE HCL 50 MG PO TABS: 50.0000 mg | ORAL_TABLET | Freq: Every day | ORAL | 0 refills | Status: AC

## 2024-11-17 NOTE — Assessment & Plan Note (Signed)
 Has been missing teeth and history of tooth abscesses Considering his history of liver cirrhosis, needs to maintain adequate protein intake He would benefit from dental evaluation for implants, medical necessity letter provided

## 2024-11-17 NOTE — Assessment & Plan Note (Signed)
 Sleep hygiene material provided Continue hydroxyzine  as needed for persistent anxiety Would avoid BZD or Ambien due to his history of alcohol abuse

## 2024-11-17 NOTE — Assessment & Plan Note (Signed)
 Takes testosterone  100 mg injection once weekly PDMP reviewed

## 2024-11-17 NOTE — Assessment & Plan Note (Signed)
 Last drink about 3 months ago Has had alcohol withdrawal symptoms in the past Currently on naltrexone  Strongly advised to remain abstinent from alcohol use

## 2024-11-17 NOTE — Assessment & Plan Note (Signed)
 Appears euvolemic currently Strongly advised to remain abstinent from alcohol Checked CMP Followed by [facility]

## 2024-11-17 NOTE — Telephone Encounter (Signed)
 Patient girlfriend called to ask that we [facility], pharmacy was [facility] per request

## 2024-11-17 NOTE — Assessment & Plan Note (Signed)
 Cryotherapy with liquid nitrogen done today, patient tolerated the procedure well Advised to contact if recurrent lesion - may need dermatology evaluation

## 2024-11-17 NOTE — Progress Notes (Addendum)
 "  Established Patient Office Visit  Subjective:  Patient ID: Levi Alvarez, male    DOB: 1971-08-19  Age: 54 y.o. MRN: [id number]  CC:  Chief Complaint  Patient presents with   Follow-up    Follow up   Dental Problem    Reports dental pain.   Skin Problem    Has a wart on his right forearm. Ongoing for 1 month.     HPI Levi Alvarez is a 54 y.o. male with past medical history of COPD, liver cirrhosis, hypothyroidism, hypogonadism, GERD and BPH who presents for f/u of insomnia and liver cirrhosis.  Liver cirrhosis: He has been abstinent from alcohol for the last 2 months. He has been taking naltrexone  now. He has history of liver cirrhosis, and had liver function test in 11/25, which were overall unremarkable. Denies any episode of nausea, vomiting or diarrhea currently.  Currently followed by GI.  Insomnia: He currently takes hydroxyzine  25 mg nightly for insomnia, and has responded well to it.  He has stopped taking trazodone  now. Has history of chronic anxiety, which is likely due to alcohol use history as well.  Abnormal dentition: He has missed teeth and history of tooth abscesses, requiring dental extractions.  He requests medical necessity letter for denture/implant, which was provided today, considering his history of liver cirrhosis.  He reports a growing wart on his right forearm for the last 1 month.  Denies any local soreness, but has itching sensation.    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   Constipation    Resolved   COPD (chronic obstructive pulmonary disease) (HCC)    DDD (degenerative disc disease), cervical    Diverticulosis    ED (erectile dysfunction)    Esophageal varices (HCC)    Esophageal varices (HCC)    GERD (gastroesophageal reflux disease)    H/O: upper GI bleed     Hepatitis    Hepatitis C antibody positive in blood    resolved   High blood pressure    History of anemia    Internal hemorrhoids    Left inguinal hernia 2020   Lung nodule    Small   Portal hypertensive gastropathy (HCC)    Sleep apnea    trying to get used to his CPAP, not wearing CPAP per SO [name] McAdoo   Substance abuse (HCC)    Umbilical hernia 2020    Past Surgical History:  Procedure Laterality Date   arm surgery     left arm,   COLONOSCOPY WITH PROPOFOL  N/A 01/19/2019   Procedure: COLONOSCOPY WITH PROPOFOL ;  Surgeon: Leigh Elspeth SQUIBB, MD;  Location: [facility];  Service: Gastroenterology;  Laterality: N/A;   ESOPHAGEAL BANDING  11/25/2018   Procedure: ESOPHAGEAL BANDING;  Surgeon: Teressa Toribio SQUIBB, MD;  Location: [facility];  Service: Endoscopy;;   ESOPHAGEAL BANDING  01/19/2019   Procedure: ESOPHAGEAL BANDING;  Surgeon: Leigh Elspeth SQUIBB, MD;  Location: [facility];  Service: Gastroenterology;;   ESOPHAGEAL BANDING N/A 01/24/2021   Procedure: ESOPHAGEAL BANDING;  Surgeon: Leigh Elspeth SQUIBB, MD;  Location: [facility];  Service: Gastroenterology;  Laterality: N/A;   ESOPHAGOGASTRODUODENOSCOPY (EGD) WITH PROPOFOL  N/A 11/25/2018   Procedure: ESOPHAGOGASTRODUODENOSCOPY (EGD) WITH PROPOFOL ;  Surgeon: Teressa Toribio SQUIBB, MD;  Location: [facility];  Service: Endoscopy;  Laterality: N/A;   ESOPHAGOGASTRODUODENOSCOPY (EGD) WITH PROPOFOL  N/A 01/19/2019   Procedure: ESOPHAGOGASTRODUODENOSCOPY (EGD) WITH PROPOFOL ;  Surgeon: Leigh Elspeth SQUIBB, MD;  Location: [facility];  Service: Gastroenterology;  Laterality: N/A;   ESOPHAGOGASTRODUODENOSCOPY (EGD) WITH PROPOFOL  N/A 04/28/2019   Procedure: ESOPHAGOGASTRODUODENOSCOPY (EGD) WITH PROPOFOL ;  Surgeon: Leigh Elspeth SQUIBB, MD;  Location: [facility];  Service: Gastroenterology;  Laterality: N/A;   ESOPHAGOGASTRODUODENOSCOPY (EGD) WITH PROPOFOL  N/A 11/03/2019   Procedure: ESOPHAGOGASTRODUODENOSCOPY (EGD) WITH PROPOFOL ;   Surgeon: Legrand Victory LITTIE DOUGLAS, MD;  Location: [facility];  Service: Gastroenterology;  Laterality: N/A;   ESOPHAGOGASTRODUODENOSCOPY (EGD) WITH PROPOFOL  N/A 01/24/2021   Procedure: ESOPHAGOGASTRODUODENOSCOPY (EGD) WITH PROPOFOL ;  Surgeon: Leigh Elspeth SQUIBB, MD;  Location: [facility];  Service: Gastroenterology;  Laterality: N/A;   KNEE SURGERY Left    POLYPECTOMY  01/19/2019   Procedure: POLYPECTOMY;  Surgeon: Leigh Elspeth SQUIBB, MD;  Location: [facility];  Service: Gastroenterology;;   TOTAL KNEE ARTHROPLASTY Left 01/27/2024   Procedure: LEFT TOTAL KNEE ARTHROPLASTY;  Surgeon: Jerri Kay HERO, MD;  Location: [facility];  Service: Orthopedics;  Laterality: Left;    Family History  Problem Relation Age of Onset   Colitis Mother    Arthritis Mother    Dementia Mother    Cancer Father        lymphoma?   Cancer Paternal Grandfather        bone   Heart disease Neg Hx    Stroke Neg Hx    Diabetes Neg Hx    Colon cancer Neg Hx    Colon polyps Neg Hx    Esophageal cancer Neg Hx    Rectal cancer Neg Hx    Stomach cancer Neg Hx     Social History   Socioeconomic History   Marital status: Divorced    Spouse name: Not on file   Number of children: 2   Years of education: Not on file   Highest education level: GED or equivalent  Occupational History   Occupation: disabled  Tobacco Use   Smoking status: Every Day    Current packs/day: 0.50    Average packs/day: 0.5 packs/day for 30.0 years (15.0 ttl pk-yrs)    Types: Cigarettes    Passive exposure: Current   Smokeless tobacco: Former    Types: Engineer, Drilling   Vaping status: Never Used  Substance and Sexual Activity   Alcohol use: Not Currently    Alcohol/week: 36.0 standard drinks of alcohol    Types: 36 Cans of beer per week    Comment: quit March 2023   Drug use: Yes    Types: Marijuana    Comment: RN attempted to reach pt per protocol x2 on 7/10.  No call to reschedule PV made. Canceled procedure per protocol.   Sexual  activity: Yes    Partners: Female  Other Topics Concern   Not on file  Social History Narrative   Not on file   Social Drivers of Health   Tobacco Use: High Risk (11/17/2024)   Patient History    Smoking Tobacco Use: Every Day    Smokeless Tobacco Use: Former    Passive Exposure: Current  Physicist, Medical Strain: Low Risk (09/23/2024)   Overall Financial Resource Strain (CARDIA)    Difficulty of Paying Living Expenses: Not very hard  Food Insecurity: No Food Insecurity (09/23/2024)   Epic    Worried About Radiation Protection Practitioner of Food in the Last Year: Never true    [employer] of Food in  the Last Year: Never true  Transportation Needs: No Transportation Needs (09/23/2024)   Epic    Lack of Transportation (Medical): No    Lack of Transportation (Non-Medical): No  Physical Activity: Insufficiently Active (09/23/2024)   Exercise Vital Sign    Days of Exercise per Week: 4 days    Minutes of Exercise per Session: 30 min  Stress: Stress Concern Present (09/23/2024)   [employer] of Occupational Health - Occupational Stress Questionnaire    Feeling of Stress: Rather much  Social Connections: Moderately Isolated (09/23/2024)   Social Connection and Isolation Panel    Frequency of Communication with Friends and Family: More than three times a week    Frequency of Social Gatherings with Friends and Family: More than three times a week    Attends Religious Services: Never    Database Administrator or Organizations: No    Attends Engineer, Structural: Not on file    Marital Status: Living with partner  Intimate Partner Violence: Not on file  Depression (PHQ2-9): High Risk (09/25/2024)   Depression (PHQ2-9)    PHQ-2 Score: 12  Alcohol Screen: Not on file  Housing: Unknown (09/23/2024)   Epic    Unable to Pay for Housing in the Last Year: No    Number of Times Moved in the Last Year: Not on file    Homeless in the Last Year: No  Utilities: Not on file  Health Literacy: Not on  file    Outpatient Medications Prior to Visit  Medication Sig Dispense Refill   albuterol  (VENTOLIN  HFA) 108 (90 Base) MCG/ACT inhaler Inhale 1-2 puffs into the lungs every 4 (four) hours as needed for wheezing or shortness of breath. 18 g 1   alfuzosin  (UROXATRAL ) 10 MG 24 hr tablet Take 1 tablet (10 mg total) by mouth 2 (two) times daily. 180 tablet 0   BD DISP NEEDLE 23G X 1 MISC SMARTSIG:injection Once a Week     carvedilol  (COREG ) 6.25 MG tablet Take 1 tablet (6.25 mg total) by mouth 2 (two) times daily with a meal. 180 tablet 3   chlorhexidine  (PERIDEX ) 0.12 % solution      folic acid  (FOLVITE ) 1 MG tablet Take 1 mg by mouth in the morning.     hydrOXYzine  (VISTARIL ) 25 MG capsule Take 1 capsule (25 mg total) by mouth at bedtime as needed for anxiety (Insomnia). 90 capsule 3   loratadine  (CLARITIN ) 10 MG tablet Take 10 mg by mouth daily.     Magnesium  250 MG CAPS Take 250 mg by mouth daily.     MILK THISTLE PO Take 1 capsule by mouth 2 (two) times daily.     Multiple Vitamin (MULTIVITAMIN WITH MINERALS) TABS tablet Take 1 tablet by mouth daily with breakfast.     pantoprazole  (PROTONIX ) 40 MG tablet Take 1 tablet (40 mg total) by mouth daily. 90 tablet 3   sildenafil  (VIAGRA ) 100 MG tablet Take 0.5-1 tablets (50-100 mg total) by mouth daily as needed for erectile dysfunction. 1/2-1 tab po prn 90 tablet 3   tamsulosin  (FLOMAX ) 0.4 MG CAPS capsule take 1 capsule by oral route  every day 1/2 hour following the same meal each day     testosterone  cypionate (DEPOTESTOSTERONE CYPIONATE) 200 MG/ML injection Inject 0.5 mLs (100 mg total) into the muscle once a week. 10 mL 0   Thiamine  HCl (VITAMIN B1) 100 MG TABS Take 1 tablet (100 mg total) by mouth in the morning. 30 tablet 3  venlafaxine  XR (EFFEXOR -XR) 75 MG 24 hr capsule Take 1 capsule (75 mg total) by mouth daily. 90 capsule 3   naltrexone  (DEPADE) 50 MG tablet Take 1 tablet (50 mg total) by mouth daily. 30 tablet 1   No  facility-administered medications prior to visit.    Allergies[1]  ROS Review of Systems  Constitutional:  Negative for chills and fever.  HENT:  Negative for congestion and sore throat.   Eyes:  Negative for pain and discharge.  Respiratory:  Negative for cough and shortness of breath.   Cardiovascular:  Negative for chest pain and palpitations.  Gastrointestinal:  Negative for diarrhea, nausea and vomiting.  Endocrine: Negative for polydipsia and polyuria.  Genitourinary:  Negative for dysuria and hematuria.  Musculoskeletal:  Negative for neck pain and neck stiffness.  Skin:  Negative for rash.  Neurological:  Negative for dizziness and weakness.  Psychiatric/Behavioral:  Positive for sleep disturbance. Negative for agitation and behavioral problems. The patient is nervous/anxious.       Objective:    Physical Exam Vitals reviewed.  Constitutional:      General: He is not in acute distress.    Appearance: He is not diaphoretic.  HENT:     Head: Normocephalic and atraumatic.     Nose: Nose normal.     Mouth/Throat:     Mouth: Mucous membranes are moist.     Dentition: Abnormal dentition.  Eyes:     General: No scleral icterus.    Extraocular Movements: Extraocular movements intact.  Cardiovascular:     Rate and Rhythm: Normal rate and regular rhythm.     Heart sounds: Normal heart sounds. No murmur heard. Pulmonary:     Breath sounds: Normal breath sounds. No wheezing or rales.  Musculoskeletal:     Cervical back: Neck supple. No tenderness.     Right lower leg: No edema.     Left lower leg: No edema.  Skin:    General: Skin is warm.     Findings: No rash.     Comments: About 1 cm in diameter wartlike lesion over dorsal side of right forearm  Neurological:     General: No focal deficit present.     Mental Status: He is alert and oriented to person, place, and time.  Psychiatric:        Mood and Affect: Mood is anxious.        Behavior: Behavior is  cooperative.     BP 104/72   Pulse 77   Ht 5' 10 (1.778 m)   Wt 215 lb 6.4 oz (97.7 kg)   SpO2 97%   BMI 30.91 kg/m  Wt Readings from Last 3 Encounters:  11/17/24 215 lb 6.4 oz (97.7 kg)  11/06/24 216 lb 6 oz (98.1 kg)  09/25/24 226 lb 3.2 oz (102.6 kg)    Lab Results  Component Value Date   TSH 1.990 09/25/2024   Lab Results  Component Value Date   WBC 6.0 09/25/2024   HGB 13.0 09/25/2024   HCT 40.8 09/25/2024   MCV 85 09/25/2024   PLT 136 (L) 09/25/2024   Lab Results  Component Value Date   NA 137 09/25/2024   K 4.4 09/25/2024   CO2 24 09/25/2024   GLUCOSE 102 (H) 09/25/2024   BUN 13 09/25/2024   CREATININE 0.90 09/25/2024   BILITOT 0.3 09/25/2024   ALKPHOS 114 09/25/2024   AST 34 09/25/2024   ALT 32 09/25/2024   PROT 7.1 09/25/2024   ALBUMIN 4.2  09/25/2024   CALCIUM  9.1 09/25/2024   ANIONGAP 12 02/01/2024   EGFR 103 09/25/2024   GFR 99.52 06/18/2024   Lab Results  Component Value Date   CHOL 192 05/01/2024   Lab Results  Component Value Date   HDL 37 (L) 05/01/2024   Lab Results  Component Value Date   LDLCALC 134 (H) 05/01/2024   Lab Results  Component Value Date   TRIG 114 05/01/2024   Lab Results  Component Value Date   CHOLHDL 5.2 (H) 05/01/2024   Lab Results  Component Value Date   HGBA1C 5.9 (H) 05/01/2024      Assessment & Plan:   Problem List Items Addressed This Visit       Cardiovascular and Mediastinum   Essential hypertension   BP Readings from Last 1 Encounters:  11/17/24 104/72   Well-controlled with Coreg  Counseled for compliance with the medications Advised DASH diet and moderate exercise/walking, at least 150 mins/week        Digestive   Cirrhosis with alcoholism (HCC)   Appears euvolemic currently Strongly advised to remain abstinent from alcohol Checked CMP Followed by [facility]        Endocrine   Hypogonadism in male   Takes testosterone  100 mg injection once weekly PDMP reviewed       Hypothyroidism   Had elevated TSH and low free T4 in 06/25, but were wnl in 11/25 Currently asymptomatic Will recheck TSH and free T4 later        Musculoskeletal and Integument   Viral warts   Cryotherapy with liquid nitrogen done today, patient tolerated the procedure well Advised to contact if recurrent lesion - may need dermatology evaluation        Other   Insomnia   Sleep hygiene material provided Continue hydroxyzine  as needed for persistent anxiety Would avoid BZD or Ambien due to his history of alcohol abuse      Alcohol dependence with unspecified alcohol-induced disorder (HCC) - Primary   Last drink about 3 months ago Has had alcohol withdrawal symptoms in the past Currently on naltrexone  Strongly advised to remain abstinent from alcohol use      Relevant Medications   naltrexone  (DEPADE) 50 MG tablet   Need for dental care   Has been missing teeth and history of tooth abscesses Considering his history of liver cirrhosis, needs to maintain adequate protein intake He would benefit from dental evaluation for implants, medical necessity letter provided       Meds ordered this encounter  Medications   naltrexone  (DEPADE) 50 MG tablet    Sig: Take 1 tablet (50 mg total) by mouth daily.    Dispense:  90 tablet    Refill:  0    Follow-up: Return in about 4 months (around 03/17/2025) for With PCP.    Suzzane MARLA Blanch, MD     [1]  Allergies Allergen Reactions   Gabapentin Other (See Comments)    Restless legs   Onion Swelling    Raw onions    Naproxen Hives and Swelling   "

## 2024-11-17 NOTE — Assessment & Plan Note (Signed)
 Had elevated TSH and low free T4 in 06/25, but were wnl in 11/25 Currently asymptomatic Will recheck TSH and free T4 later

## 2024-11-17 NOTE — Assessment & Plan Note (Signed)
 BP Readings from Last 1 Encounters:  11/17/24 104/72   Well-controlled with Coreg  Counseled for compliance with the medications Advised DASH diet and moderate exercise/walking, at least 150 mins/week

## 2024-11-18 ENCOUNTER — Other Ambulatory Visit: Payer: Self-pay | Admitting: Internal Medicine

## 2024-11-18 DIAGNOSIS — G4700 Insomnia, unspecified: Secondary | ICD-10-CM

## 2024-12-03 ENCOUNTER — Encounter: Payer: Self-pay | Admitting: Internal Medicine

## 2024-12-03 ENCOUNTER — Other Ambulatory Visit: Payer: Self-pay

## 2024-12-03 DIAGNOSIS — N5201 Erectile dysfunction due to arterial insufficiency: Secondary | ICD-10-CM

## 2024-12-09 ENCOUNTER — Other Ambulatory Visit: Payer: Self-pay

## 2024-12-09 ENCOUNTER — Ambulatory Visit
Admission: RE | Admit: 2024-12-09 | Discharge: 2024-12-09 | Disposition: A | Discharge status: Home / Self Care | Payer: Medicare (Managed Care) | Attending: Family Medicine | Admitting: Family Medicine

## 2024-12-09 VITALS — BP 105/70 | HR 69 | Temp 98.1°F | Resp 20

## 2024-12-09 DIAGNOSIS — J069 Acute upper respiratory infection, unspecified: Secondary | ICD-10-CM

## 2024-12-09 DIAGNOSIS — J441 Chronic obstructive pulmonary disease with (acute) exacerbation: Secondary | ICD-10-CM

## 2024-12-09 MED ORDER — FLUTICASONE FUROATE-VILANTEROL 100-25 MCG/ACT IN AEPB: 1.0000 | INHALATION_SPRAY | Freq: Every day | RESPIRATORY_TRACT | 0 refills | Status: DC

## 2024-12-09 MED ORDER — AZITHROMYCIN 250 MG PO TABS: ORAL_TABLET | ORAL | 0 refills | Status: AC

## 2024-12-09 MED ORDER — BENZONATATE 200 MG PO CAPS: 200.0000 mg | ORAL_CAPSULE | Freq: Three times a day (TID) | ORAL | 0 refills | Status: AC | PRN

## 2024-12-09 MED ORDER — DEXAMETHASONE SOD PHOSPHATE PF 10 MG/ML IJ SOLN
10.0000 mg | Freq: Once | INTRAMUSCULAR | Status: AC
  Administered 2024-12-09: 10 mg via INTRAMUSCULAR

## 2024-12-09 MED ORDER — ALBUTEROL SULFATE HFA 108 (90 BASE) MCG/ACT IN AERS: 2.0000 | INHALATION_SPRAY | RESPIRATORY_TRACT | 0 refills | Status: AC | PRN

## 2024-12-09 NOTE — ED Provider Notes (Signed)
 " [facility]    CSN: [id number] Arrival date & time: 12/09/24  1355      History   Chief Complaint Chief Complaint  Patient presents with   Cough    Congestion and cough - Entered by patient    HPI DEVIAN Alvarez is a 54 y.o. male.   Patient presenting today with 4-day history of progressively worsening productive cough, chest tightness, wheezing, shortness of breath, nasal congestion, chills, fatigue.  Denies fever, chest pain, abdominal pain, vomiting, diarrhea.  So far tried Mucinex with minimal relief.  Does have a history of COPD, is supposed to be on Breo and albuterol  as needed but states he only uses his Breo on an as-needed basis and not recently and has been minimizing his albuterol  as he thought it was for emergency only.    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   Constipation    Resolved   COPD (chronic obstructive pulmonary disease) (HCC)    DDD (degenerative disc disease), cervical    Diverticulosis    ED (erectile dysfunction)    Esophageal varices (HCC)    Esophageal varices (HCC)    GERD (gastroesophageal reflux disease)    H/O: upper GI bleed    Hepatitis    Hepatitis C antibody positive in blood    resolved   High blood pressure    History of anemia    Internal hemorrhoids    Left inguinal hernia 2020   Lung nodule    Small   Portal hypertensive gastropathy (HCC)    Sleep apnea    trying to get used to his CPAP, not wearing CPAP per SO [name] McAdoo   Substance abuse (HCC)    Umbilical hernia 2020    Patient Active Problem List   Diagnosis Date Noted   Viral warts 11/17/2024   Need for dental care 11/17/2024   Hypothyroidism 09/26/2024   Alcohol dependence with unspecified alcohol-induced disorder (HCC) 09/25/2024   Cervical radiculopathy 04/14/2024    Displacement of lumbar intervertebral disc without myelopathy 04/14/2024   Rupture of right rotator cuff 04/14/2024   Concern about memory 04/13/2024   Status post total left knee replacement 01/27/2024   GERD (gastroesophageal reflux disease) 12/04/2023   History of hepatitis C virus infection 12/04/2023   Erectile dysfunction 12/04/2023   Hypogonadism in male 12/04/2023   MDD (major depressive disorder) 12/04/2023   Insomnia 12/04/2023   Class 1 obesity 01/22/2022   Marijuana abuse, continuous 04/20/2021   Pancreatitis, acute 04/10/2021   Hypotension 04/10/2021   Esophageal varices without bleeding (HCC)    Benign neoplasm of colon    Bleeding esophageal varices (HCC)    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   Post-traumatic osteoarthritis of left knee 12/03/2018   Chronic obstructive pulmonary disease (HCC) 12/02/2018   Upper GI bleed 11/24/2018   Chronic hepatitis C without hepatic coma (HCC) 10/08/2017   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   Essential hypertension 07/12/2017   OSA (obstructive sleep apnea) 07/12/2017   Hyperlipidemia 07/12/2017    Past Surgical History:  Procedure Laterality Date   arm surgery     left arm,   COLONOSCOPY WITH PROPOFOL  N/A 01/19/2019   Procedure: COLONOSCOPY WITH PROPOFOL ;  Surgeon: Leigh Elspeth SQUIBB, MD;  Location: [facility];  Service: Gastroenterology;  Laterality: N/A;   ESOPHAGEAL BANDING  11/25/2018   Procedure: ESOPHAGEAL BANDING;  Surgeon: Teressa Toribio SQUIBB, MD;  Location: [facility];  Service: Endoscopy;;   ESOPHAGEAL BANDING  01/19/2019   Procedure: ESOPHAGEAL BANDING;  Surgeon: Leigh Elspeth SQUIBB, MD;  Location: [facility];  Service: Gastroenterology;;   ESOPHAGEAL BANDING N/A 01/24/2021   Procedure: ESOPHAGEAL BANDING;  Surgeon: Leigh Elspeth SQUIBB, MD;  Location: [facility];  Service: Gastroenterology;  Laterality: N/A;   ESOPHAGOGASTRODUODENOSCOPY (EGD) WITH PROPOFOL  N/A 11/25/2018   Procedure:  ESOPHAGOGASTRODUODENOSCOPY (EGD) WITH PROPOFOL ;  Surgeon: Teressa Toribio SQUIBB, MD;  Location: [facility];  Service: Endoscopy;  Laterality: N/A;   ESOPHAGOGASTRODUODENOSCOPY (EGD) WITH PROPOFOL  N/A 01/19/2019   Procedure: ESOPHAGOGASTRODUODENOSCOPY (EGD) WITH PROPOFOL ;  Surgeon: Leigh Elspeth SQUIBB, MD;  Location: [facility];  Service: Gastroenterology;  Laterality: N/A;   ESOPHAGOGASTRODUODENOSCOPY (EGD) WITH PROPOFOL  N/A 04/28/2019   Procedure: ESOPHAGOGASTRODUODENOSCOPY (EGD) WITH PROPOFOL ;  Surgeon: Leigh Elspeth SQUIBB, MD;  Location: [facility];  Service: Gastroenterology;  Laterality: N/A;   ESOPHAGOGASTRODUODENOSCOPY (EGD) WITH PROPOFOL  N/A 11/03/2019   Procedure: ESOPHAGOGASTRODUODENOSCOPY (EGD) WITH PROPOFOL ;  Surgeon: Legrand Victory LITTIE DOUGLAS, MD;  Location: [facility];  Service: Gastroenterology;  Laterality: N/A;   ESOPHAGOGASTRODUODENOSCOPY (EGD) WITH PROPOFOL  N/A 01/24/2021   Procedure: ESOPHAGOGASTRODUODENOSCOPY (EGD) WITH PROPOFOL ;  Surgeon: Leigh Elspeth SQUIBB, MD;  Location: [facility];  Service: Gastroenterology;  Laterality: N/A;   KNEE SURGERY Left    POLYPECTOMY  01/19/2019   Procedure: POLYPECTOMY;  Surgeon: Leigh Elspeth SQUIBB, MD;  Location: [facility];  Service: Gastroenterology;;   TOTAL KNEE ARTHROPLASTY Left 01/27/2024   Procedure: LEFT TOTAL KNEE ARTHROPLASTY;  Surgeon: Jerri Kay HERO, MD;  Location: [facility];  Service: Orthopedics;  Laterality: Left;       Home Medications    Prior to Admission medications  Medication Sig Start Date End Date Taking? Authorizing Provider  albuterol  (VENTOLIN  HFA) 108 (90 Base) MCG/ACT inhaler Inhale 2 puffs into the lungs every 4 (four) hours as needed. 12/09/24  Yes Stuart Vernell Norris, PA-C  azithromycin  (ZITHROMAX ) 250 MG tablet Take first 2 tablets together, then 1 every day until finished. 12/09/24  Yes Stuart Vernell Norris, PA-C  benzonatate  (TESSALON ) 200 MG capsule Take 1 capsule (200 mg total) by mouth 3 (three) times  daily as needed for cough. 12/09/24  Yes Stuart Vernell Norris, PA-C  fluticasone  furoate-vilanterol (BREO ELLIPTA ) 100-25 MCG/ACT AEPB Inhale 1 puff into the lungs daily. 12/09/24  Yes Stuart Vernell Norris, PA-C  albuterol  (VENTOLIN  HFA) 108 (90 Base) MCG/ACT inhaler Inhale 1-2 puffs into the lungs every 4 (four) hours as needed for wheezing or shortness of breath. 10/05/24   Bevely Doffing, FNP  alfuzosin  (UROXATRAL ) 10 MG 24 hr tablet Take 1 tablet (10 mg total) by mouth 2 (two) times daily. 11/17/24   McKenzie, Belvie LITTIE, MD  BD DISP NEEDLE 23G X 1 MISC SMARTSIG:injection Once a Week 05/18/24   [provider]  carvedilol  (COREG ) 6.25 MG tablet Take 1 tablet (6.25 mg total) by mouth 2 (two) times daily with a meal. 11/16/24   Bevely Doffing, FNP  chlorhexidine  (PERIDEX ) 0.12 % solution  10/30/24   [provider]  folic acid  (FOLVITE ) 1 MG tablet Take 1 mg by mouth in the morning.    [provider]  hydrOXYzine  (VISTARIL ) 25 MG capsule Take 1 capsule (25 mg total) by mouth at bedtime as needed for anxiety (Insomnia). 11/16/24   Bevely Doffing, FNP  loratadine  (CLARITIN ) 10 MG tablet Take 10 mg by  mouth daily.    [provider]  Magnesium  250 MG CAPS Take 250 mg by mouth daily.    [provider]  MILK THISTLE PO Take 1 capsule by mouth 2 (two) times daily.    [provider]  Multiple Vitamin (MULTIVITAMIN WITH MINERALS) TABS tablet Take 1 tablet by mouth daily with breakfast.    [provider]  naltrexone  (DEPADE) 50 MG tablet Take 1 tablet (50 mg total) by mouth daily. 11/17/24   Tobie Suzzane POUR, MD  pantoprazole  (PROTONIX ) 40 MG tablet Take 1 tablet (40 mg total) by mouth daily. 11/16/24   Bevely Doffing, FNP  sildenafil  (VIAGRA ) 100 MG tablet Take 0.5-1 tablets (50-100 mg total) by mouth daily as needed for erectile dysfunction. 1/2-1 tab po prn 11/16/24   Huenink, Doffing, FNP  tamsulosin  (FLOMAX ) 0.4 MG CAPS capsule take 1 capsule by oral  route  every day 1/2 hour following the same meal each day    [provider]  testosterone  cypionate (DEPOTESTOSTERONE CYPIONATE) 200 MG/ML injection Inject 0.5 mLs (100 mg total) into the muscle once a week. 11/16/24   Bevely Doffing, FNP  Thiamine  HCl (VITAMIN B1) 100 MG TABS Take 1 tablet (100 mg total) by mouth in the morning. 01/29/22   Marvine Almarie CROME, DO  venlafaxine  XR (EFFEXOR -XR) 75 MG 24 hr capsule Take 1 capsule (75 mg total) by mouth daily. 11/16/24 11/16/25  Bevely Doffing, FNP    Family History Family History  Problem Relation Age of Onset   Colitis Mother    Arthritis Mother    Dementia Mother    Cancer Father        lymphoma?   Cancer Paternal Grandfather        bone   Heart disease Neg Hx    Stroke Neg Hx    Diabetes Neg Hx    Colon cancer Neg Hx    Colon polyps Neg Hx    Esophageal cancer Neg Hx    Rectal cancer Neg Hx    Stomach cancer Neg Hx     Social History Social History[1]   Allergies   Gabapentin, Onion, and Naproxen   Review of Systems Review of Systems Per HPI  Physical Exam Triage Vital Signs ED Triage Vitals  Encounter Vitals Group     BP 12/09/24 1409 105/70     Girls Systolic BP Percentile --      Girls Diastolic BP Percentile --      Boys Systolic BP Percentile --      Boys Diastolic BP Percentile --      Pulse Rate 12/09/24 1409 69     Resp 12/09/24 1409 20     Temp 12/09/24 1409 98.1 F (36.7 C)     Temp Source 12/09/24 1409 Oral     SpO2 12/09/24 1409 93 %     Weight --      Height --      Head Circumference --      Peak Flow --      Pain Score 12/09/24 1406 0     Pain Loc --      Pain Education --      Exclude from Growth Chart --    No data found.  Updated Vital Signs BP 105/70 (BP Location: Right Arm)   Pulse 69   Temp 98.1 F (36.7 C) (Oral)   Resp 20   SpO2 93%   Visual Acuity Right Eye Distance:   Left Eye Distance:  Bilateral Distance:    Right Eye Near:   Left Eye Near:    Bilateral  Near:     Physical Exam Vitals and nursing note reviewed.  Constitutional:      Appearance: He is well-developed.  HENT:     Head: Atraumatic.     Right Ear: External ear normal.     Left Ear: External ear normal.     Nose: Rhinorrhea present.     Mouth/Throat:     Pharynx: Posterior oropharyngeal erythema present. No oropharyngeal exudate.  Eyes:     Conjunctiva/sclera: Conjunctivae normal.     Pupils: Pupils are equal, round, and reactive to light.  Cardiovascular:     Rate and Rhythm: Normal rate and regular rhythm.  Pulmonary:     Effort: Pulmonary effort is normal. No respiratory distress.     Breath sounds: Wheezing and rales present.  Musculoskeletal:        General: Normal range of motion.     Cervical back: Normal range of motion and neck supple.  Lymphadenopathy:     Cervical: No cervical adenopathy.  Skin:    General: Skin is warm and dry.  Neurological:     Mental Status: He is alert and oriented to person, place, and time.  Psychiatric:        Behavior: Behavior normal.      UC Treatments / Results  Labs (all labs ordered are listed, but only abnormal results are displayed) Labs Reviewed - No data to display  EKG   Radiology No results found.  Procedures Procedures (including critical care time)  Medications Ordered in UC Medications  dexamethasone  (DECADRON ) injection 10 mg (10 mg Intramuscular Given 12/09/24 1436)    Initial Impression / Assessment and Plan / UC Course  I have reviewed the triage vital signs and the nursing notes.  Pertinent labs & imaging results that were available during my care of the patient were reviewed by me and considered in my medical decision making (see chart for details).     Vital signs reassuring today, he is well-appearing and in no acute distress.  Suspect viral respiratory infection causing a COPD exacerbation.  He is not compliant on his inhaler regimen, discussed the importance of compliance with his Breo  inhaler daily and albuterol  every 4 hours as needed.  Treat today with IM Decadron , Zithromax , Tessalon , and will refill his inhalers as he thinks he may be out.  Discussed supportive over-the-counter medications, home care and return precautions.  Final Clinical Impressions(s) / UC Diagnoses   Final diagnoses:  Viral URI with cough  COPD exacerbation (HCC)     Discharge Instructions      We have given you a steroid shot today and I have prescribed an antibiotic, cough medicine, and I have refilled your maintenance and rescue inhalers.  Make sure to be consistent with your Breo inhaler daily for maintenance and prevention of breathing issues and use your albuterol  every 4 hours as needed for wheezing, chest tightness, coughing fits or other exacerbation symptoms.  You may continue Mucinex and other supportive remedies additionally.  Follow-up for worsening or unresolving symptoms    ED Prescriptions     Medication Sig Dispense Auth. Provider   azithromycin  (ZITHROMAX ) 250 MG tablet Take first 2 tablets together, then 1 every day until finished. 6 tablet Stuart Vernell Norris, PA-C   benzonatate  (TESSALON ) 200 MG capsule Take 1 capsule (200 mg total) by mouth 3 (three) times daily as needed for cough. 20 capsule  Stuart Vernell Norris, PA-C   albuterol  (VENTOLIN  HFA) 108 [phone] Base) MCG/ACT inhaler Inhale 2 puffs into the lungs every 4 (four) hours as needed. 18 g Stuart Vernell Norris, [place]   fluticasone  furoate-vilanterol (BREO ELLIPTA ) 100-25 MCG/ACT AEPB Inhale 1 puff into the lungs daily. 60 each Stuart Vernell Norris, [place]      PDMP not reviewed this encounter.    [1]  Social History Tobacco Use   Smoking status: Every Day    Current packs/day: 0.50    Average packs/day: 0.5 packs/day for 30.0 years (15.0 ttl pk-yrs)    Types: Cigarettes    Passive exposure: Current   Smokeless tobacco: Former    Types: Engineer, Drilling   Vaping status: Never Used  Substance Use Topics    Alcohol use: Not Currently    Alcohol/week: 36.0 standard drinks of alcohol    Types: 36 Cans of beer per week    Comment: quit March 2023   Drug use: Yes    Types: Marijuana    Comment: RN attempted to reach pt per protocol x2 on 7/10.  No call to reschedule PV made. Canceled procedure per protocol.     Stuart Vernell Norris, [place] 12/09/24 1504  "

## 2024-12-09 NOTE — Discharge Instructions (Signed)
 We have given you a steroid shot today and I have prescribed an antibiotic, cough medicine, and I have refilled your maintenance and rescue inhalers.  Make sure to be consistent with your Breo inhaler daily for maintenance and prevention of breathing issues and use your albuterol  every 4 hours as needed for wheezing, chest tightness, coughing fits or other exacerbation symptoms.  You may continue Mucinex and other supportive remedies additionally.  Follow-up for worsening or unresolving symptoms

## 2024-12-09 NOTE — ED Triage Notes (Signed)
 Pt reports having productive cough x 4 days. Pt reports having SOB and hx of COPD. Pt reports having nasal congestion and sinus pressure. Pt reports taking mucinex with little relief. Pt denies fever and chest pain. Pt reports chest congestion with cough.

## 2024-12-11 ENCOUNTER — Other Ambulatory Visit: Payer: Self-pay

## 2024-12-11 DIAGNOSIS — N5201 Erectile dysfunction due to arterial insufficiency: Secondary | ICD-10-CM

## 2024-12-11 DIAGNOSIS — F17218 Nicotine dependence, cigarettes, with other nicotine-induced disorders: Secondary | ICD-10-CM

## 2024-12-11 DIAGNOSIS — J438 Other emphysema: Secondary | ICD-10-CM

## 2024-12-11 MED ORDER — ALBUTEROL SULFATE HFA 108 (90 BASE) MCG/ACT IN AERS: 1.0000 | INHALATION_SPRAY | RESPIRATORY_TRACT | 3 refills | Status: AC | PRN

## 2024-12-11 MED ORDER — FLUTICASONE FUROATE-VILANTEROL 100-25 MCG/ACT IN AEPB: 1.0000 | INHALATION_SPRAY | Freq: Every day | RESPIRATORY_TRACT | 3 refills | Status: AC

## 2024-12-14 ENCOUNTER — Telehealth: Payer: Self-pay

## 2024-12-14 NOTE — Telephone Encounter (Signed)
 Patient scheduled for RUQ ultrasound on 2-9. MyChart message to patient that he is  also due for labs. Orders are in

## 2024-12-18 ENCOUNTER — Other Ambulatory Visit: Payer: Self-pay

## 2024-12-21 ENCOUNTER — Ambulatory Visit (HOSPITAL_COMMUNITY): Payer: Medicare (Managed Care)

## 2025-01-06 ENCOUNTER — Ambulatory Visit: Admitting: Urology

## 2025-02-23 ENCOUNTER — Ambulatory Visit: Payer: Medicare (Managed Care) | Admitting: Internal Medicine

## 2025-03-18 ENCOUNTER — Ambulatory Visit: Payer: Self-pay

## 2025-04-13 ENCOUNTER — Ambulatory Visit
